# Patient Record
Sex: Female | Born: 1950
Health system: Southern US, Community
[De-identification: ages and names within clinical notes are randomized; demographics above are authoritative.]

## PROBLEM LIST (undated history)

## (undated) DIAGNOSIS — K922 Gastrointestinal hemorrhage, unspecified: Secondary | ICD-10-CM

## (undated) DIAGNOSIS — I351 Nonrheumatic aortic (valve) insufficiency: Secondary | ICD-10-CM

## (undated) DIAGNOSIS — I10 Essential (primary) hypertension: Secondary | ICD-10-CM

## (undated) DIAGNOSIS — Z954 Presence of other heart-valve replacement: Secondary | ICD-10-CM

## (undated) DIAGNOSIS — I319 Disease of pericardium, unspecified: Secondary | ICD-10-CM

## (undated) DIAGNOSIS — E785 Hyperlipidemia, unspecified: Secondary | ICD-10-CM

## (undated) DIAGNOSIS — C801 Malignant (primary) neoplasm, unspecified: Secondary | ICD-10-CM

## (undated) DIAGNOSIS — R0600 Dyspnea, unspecified: Secondary | ICD-10-CM

## (undated) DIAGNOSIS — R600 Localized edema: Secondary | ICD-10-CM

## (undated) DIAGNOSIS — I8393 Asymptomatic varicose veins of bilateral lower extremities: Secondary | ICD-10-CM

## (undated) DIAGNOSIS — R011 Cardiac murmur, unspecified: Secondary | ICD-10-CM

## (undated) DIAGNOSIS — I509 Heart failure, unspecified: Secondary | ICD-10-CM

## (undated) DIAGNOSIS — D649 Anemia, unspecified: Secondary | ICD-10-CM

## (undated) DIAGNOSIS — I071 Rheumatic tricuspid insufficiency: Secondary | ICD-10-CM

## (undated) DIAGNOSIS — Z9889 Other specified postprocedural states: Secondary | ICD-10-CM

## (undated) DIAGNOSIS — I2510 Atherosclerotic heart disease of native coronary artery without angina pectoris: Secondary | ICD-10-CM

## (undated) DIAGNOSIS — I456 Pre-excitation syndrome: Secondary | ICD-10-CM

## (undated) DIAGNOSIS — I499 Cardiac arrhythmia, unspecified: Secondary | ICD-10-CM

## (undated) DIAGNOSIS — E039 Hypothyroidism, unspecified: Secondary | ICD-10-CM

## (undated) DIAGNOSIS — G459 Transient cerebral ischemic attack, unspecified: Secondary | ICD-10-CM

## (undated) DIAGNOSIS — N189 Chronic kidney disease, unspecified: Secondary | ICD-10-CM

## (undated) DIAGNOSIS — I639 Cerebral infarction, unspecified: Secondary | ICD-10-CM

## (undated) HISTORY — PX: ENDOMETRIAL ABLATION: SHX621

## (undated) HISTORY — DX: Hyperlipidemia, unspecified: E78.5

## (undated) HISTORY — PX: SPLENECTOMY, TOTAL: SHX788

## (undated) HISTORY — DX: Presence of other heart-valve replacement: Z95.4

## (undated) HISTORY — PX: MITRAL VALVE REPLACEMENT: SHX147

## (undated) HISTORY — DX: Pre-excitation syndrome: I45.6

## (undated) HISTORY — DX: Other specified postprocedural states: Z98.890

## (undated) HISTORY — DX: Heart failure, unspecified: I50.9

## (undated) HISTORY — PX: THORACOTOMY: SUR1349

## (undated) HISTORY — DX: Hypothyroidism, unspecified: E03.9

## (undated) HISTORY — DX: Gastrointestinal hemorrhage, unspecified: K92.2

## (undated) HISTORY — PX: EXPLORATORY LAPAROTOMY: SUR591

## (undated) HISTORY — PX: MASTECTOMY: SHX3

## (undated) HISTORY — PX: OTHER SURGICAL HISTORY: SHX169

## (undated) HISTORY — DX: Nonrheumatic aortic (valve) insufficiency: I35.1

## (undated) HISTORY — PX: SPLENECTOMY: SUR1306

## (undated) HISTORY — DX: Malignant (primary) neoplasm, unspecified: C80.1

## (undated) HISTORY — PX: TONSILLECTOMY: SUR1361

## (undated) HISTORY — DX: Anemia, unspecified: D64.9

## (undated) HISTORY — PX: AORTIC VALVE SURGERY: SHX549

---
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
## 1996-05-27 LAB — HM COLONOSCOPY: HM Colonoscopy: NORMAL

## 1996-11-02 ENCOUNTER — Encounter: Payer: Self-pay | Admitting: Internal Medicine

## 1997-12-13 ENCOUNTER — Other Ambulatory Visit: Admission: RE | Admit: 1997-12-13 | Discharge: 1997-12-13 | Payer: Self-pay | Admitting: *Deleted

## 1999-01-04 ENCOUNTER — Other Ambulatory Visit: Admission: RE | Admit: 1999-01-04 | Discharge: 1999-01-04 | Payer: Self-pay | Admitting: *Deleted

## 2000-01-11 ENCOUNTER — Other Ambulatory Visit: Admission: RE | Admit: 2000-01-11 | Discharge: 2000-01-11 | Payer: Self-pay | Admitting: *Deleted

## 2001-01-22 ENCOUNTER — Other Ambulatory Visit: Admission: RE | Admit: 2001-01-22 | Discharge: 2001-01-22 | Payer: Self-pay | Admitting: *Deleted

## 2001-03-02 ENCOUNTER — Encounter: Payer: Self-pay | Admitting: Internal Medicine

## 2002-02-12 ENCOUNTER — Other Ambulatory Visit: Admission: RE | Admit: 2002-02-12 | Discharge: 2002-02-12 | Payer: Self-pay | Admitting: *Deleted

## 2002-08-13 ENCOUNTER — Emergency Department (HOSPITAL_COMMUNITY): Admission: EM | Admit: 2002-08-13 | Discharge: 2002-08-13 | Payer: Self-pay | Admitting: Emergency Medicine

## 2003-02-07 ENCOUNTER — Encounter: Payer: Self-pay | Admitting: Internal Medicine

## 2003-02-07 LAB — HM COLONOSCOPY

## 2003-02-14 ENCOUNTER — Other Ambulatory Visit: Admission: RE | Admit: 2003-02-14 | Discharge: 2003-02-14 | Payer: Self-pay | Admitting: *Deleted

## 2003-02-16 ENCOUNTER — Encounter: Payer: Self-pay | Admitting: Internal Medicine

## 2003-03-02 ENCOUNTER — Encounter: Payer: Self-pay | Admitting: Internal Medicine

## 2003-03-21 ENCOUNTER — Encounter: Payer: Self-pay | Admitting: Internal Medicine

## 2003-04-07 ENCOUNTER — Ambulatory Visit (HOSPITAL_COMMUNITY): Admission: RE | Admit: 2003-04-07 | Discharge: 2003-04-08 | Payer: Self-pay | Admitting: Cardiology

## 2003-04-15 ENCOUNTER — Encounter: Payer: Self-pay | Admitting: Cardiology

## 2003-04-15 ENCOUNTER — Ambulatory Visit (HOSPITAL_COMMUNITY): Admission: RE | Admit: 2003-04-15 | Discharge: 2003-04-15 | Payer: Self-pay | Admitting: Cardiology

## 2003-12-28 ENCOUNTER — Encounter: Payer: Self-pay | Admitting: Internal Medicine

## 2003-12-29 ENCOUNTER — Emergency Department (HOSPITAL_COMMUNITY): Admission: EM | Admit: 2003-12-29 | Discharge: 2003-12-30 | Payer: Self-pay

## 2004-07-12 ENCOUNTER — Ambulatory Visit: Payer: Self-pay | Admitting: Cardiology

## 2004-07-24 ENCOUNTER — Ambulatory Visit: Payer: Self-pay

## 2004-08-27 ENCOUNTER — Ambulatory Visit: Payer: Self-pay | Admitting: Internal Medicine

## 2004-12-07 ENCOUNTER — Ambulatory Visit: Payer: Self-pay | Admitting: Internal Medicine

## 2004-12-24 ENCOUNTER — Ambulatory Visit: Payer: Self-pay | Admitting: Internal Medicine

## 2005-01-04 ENCOUNTER — Ambulatory Visit: Payer: Self-pay | Admitting: Internal Medicine

## 2005-02-19 ENCOUNTER — Ambulatory Visit: Payer: Self-pay | Admitting: Internal Medicine

## 2005-03-05 ENCOUNTER — Encounter (HOSPITAL_COMMUNITY): Admission: RE | Admit: 2005-03-05 | Discharge: 2005-03-06 | Payer: Self-pay | Admitting: Internal Medicine

## 2005-04-10 ENCOUNTER — Ambulatory Visit: Payer: Self-pay | Admitting: Internal Medicine

## 2005-06-11 ENCOUNTER — Ambulatory Visit: Payer: Self-pay | Admitting: Internal Medicine

## 2005-07-01 ENCOUNTER — Ambulatory Visit: Payer: Self-pay | Admitting: Internal Medicine

## 2005-08-29 ENCOUNTER — Ambulatory Visit: Payer: Self-pay | Admitting: Cardiology

## 2005-09-06 ENCOUNTER — Ambulatory Visit: Payer: Self-pay | Admitting: Internal Medicine

## 2005-09-06 ENCOUNTER — Encounter: Payer: Self-pay | Admitting: Internal Medicine

## 2005-09-06 ENCOUNTER — Ambulatory Visit (HOSPITAL_COMMUNITY): Admission: RE | Admit: 2005-09-06 | Discharge: 2005-09-06 | Payer: Self-pay | Admitting: Internal Medicine

## 2006-01-06 ENCOUNTER — Ambulatory Visit: Payer: Self-pay

## 2006-01-06 ENCOUNTER — Encounter: Payer: Self-pay | Admitting: Cardiology

## 2006-01-14 ENCOUNTER — Ambulatory Visit: Payer: Self-pay | Admitting: Internal Medicine

## 2006-03-07 ENCOUNTER — Ambulatory Visit (HOSPITAL_COMMUNITY): Admission: RE | Admit: 2006-03-07 | Discharge: 2006-03-07 | Payer: Self-pay | Admitting: Cardiology

## 2006-03-07 ENCOUNTER — Ambulatory Visit: Payer: Self-pay | Admitting: Cardiology

## 2006-03-13 ENCOUNTER — Inpatient Hospital Stay (HOSPITAL_BASED_OUTPATIENT_CLINIC_OR_DEPARTMENT_OTHER): Admission: RE | Admit: 2006-03-13 | Discharge: 2006-03-13 | Payer: Self-pay | Admitting: Cardiology

## 2006-03-13 ENCOUNTER — Ambulatory Visit: Payer: Self-pay | Admitting: Cardiology

## 2006-04-22 ENCOUNTER — Ambulatory Visit: Payer: Self-pay | Admitting: Internal Medicine

## 2006-04-23 LAB — CONVERTED CEMR LAB
ALT: 22 units/L (ref 0–40)
AST: 30 units/L (ref 0–37)
Albumin: 3.8 g/dL (ref 3.5–5.2)
Alkaline Phosphatase: 55 units/L (ref 39–117)
BUN: 15 mg/dL (ref 6–23)
Basophils Absolute: 0 10*3/uL (ref 0.0–0.1)
Basophils Relative: 0.3 % (ref 0.0–1.0)
Bilirubin, Direct: 0.1 mg/dL (ref 0.0–0.3)
CO2: 30 meq/L (ref 19–32)
Calcium: 9.5 mg/dL (ref 8.4–10.5)
Chloride: 100 meq/L (ref 96–112)
Creatinine, Ser: 1.6 mg/dL — ABNORMAL HIGH (ref 0.4–1.2)
Eosinophil percent: 3.2 % (ref 0.0–5.0)
GFR calc non Af Amer: 36 mL/min
Glomerular Filtration Rate, Af Am: 43 mL/min/{1.73_m2}
Glucose, Bld: 107 mg/dL — ABNORMAL HIGH (ref 70–99)
HCT: 34.1 % — ABNORMAL LOW (ref 36.0–46.0)
Hemoglobin: 10.9 g/dL — ABNORMAL LOW (ref 12.0–15.0)
Lymphocytes Relative: 14.2 % (ref 12.0–46.0)
MCHC: 32 g/dL (ref 30.0–36.0)
MCV: 87.7 fL (ref 78.0–100.0)
Monocytes Absolute: 0.8 10*3/uL — ABNORMAL HIGH (ref 0.2–0.7)
Monocytes Relative: 9.5 % (ref 3.0–11.0)
Neutro Abs: 5.8 10*3/uL (ref 1.4–7.7)
Neutrophils Relative %: 72.8 % (ref 43.0–77.0)
Platelets: 519 10*3/uL — ABNORMAL HIGH (ref 150–400)
Potassium: 4.1 meq/L (ref 3.5–5.1)
RBC: 3.88 M/uL (ref 3.87–5.11)
RDW: 14 % (ref 11.5–14.6)
Sodium: 139 meq/L (ref 135–145)
TSH: 0.18 microintl units/mL — ABNORMAL LOW (ref 0.35–5.50)
Total Bilirubin: 0.4 mg/dL (ref 0.3–1.2)
Total Protein: 7.1 g/dL (ref 6.0–8.3)
WBC: 8 10*3/uL (ref 4.5–10.5)

## 2006-05-27 DIAGNOSIS — I639 Cerebral infarction, unspecified: Secondary | ICD-10-CM

## 2006-05-27 HISTORY — DX: Cerebral infarction, unspecified: I63.9

## 2006-06-13 HISTORY — PX: OTHER SURGICAL HISTORY: SHX169

## 2006-07-01 ENCOUNTER — Ambulatory Visit: Payer: Self-pay | Admitting: Cardiology

## 2006-07-08 ENCOUNTER — Ambulatory Visit: Payer: Self-pay | Admitting: Cardiology

## 2006-07-15 ENCOUNTER — Ambulatory Visit: Payer: Self-pay | Admitting: Cardiology

## 2006-07-15 LAB — CONVERTED CEMR LAB
BUN: 10 mg/dL (ref 6–23)
Basophils Absolute: 0.1 10*3/uL (ref 0.0–0.1)
Basophils Relative: 0.6 % (ref 0.0–1.0)
CO2: 32 meq/L (ref 19–32)
Calcium: 9.6 mg/dL (ref 8.4–10.5)
Chloride: 102 meq/L (ref 96–112)
Creatinine, Ser: 1 mg/dL (ref 0.4–1.2)
Eosinophils Absolute: 0.7 10*3/uL — ABNORMAL HIGH (ref 0.0–0.6)
Eosinophils Relative: 6.7 % — ABNORMAL HIGH (ref 0.0–5.0)
GFR calc Af Amer: 74 mL/min
GFR calc non Af Amer: 61 mL/min
Glucose, Bld: 92 mg/dL (ref 70–99)
HCT: 34.1 % — ABNORMAL LOW (ref 36.0–46.0)
Hemoglobin: 11.3 g/dL — ABNORMAL LOW (ref 12.0–15.0)
INR: 1.8 (ref 0.9–2.0)
Lymphocytes Relative: 14.7 % (ref 12.0–46.0)
MCHC: 33 g/dL (ref 30.0–36.0)
MCV: 87.1 fL (ref 78.0–100.0)
Monocytes Absolute: 1.4 10*3/uL — ABNORMAL HIGH (ref 0.2–0.7)
Monocytes Relative: 13.6 % — ABNORMAL HIGH (ref 3.0–11.0)
Neutro Abs: 6.8 10*3/uL (ref 1.4–7.7)
Neutrophils Relative %: 64.4 % (ref 43.0–77.0)
Platelets: 596 10*3/uL — ABNORMAL HIGH (ref 150–400)
Potassium: 4 meq/L (ref 3.5–5.1)
Prothrombin Time: 17.1 s — ABNORMAL HIGH (ref 10.0–14.0)
RBC: 3.92 M/uL (ref 3.87–5.11)
RDW: 16 % — ABNORMAL HIGH (ref 11.5–14.6)
Sodium: 142 meq/L (ref 135–145)
WBC: 10.5 10*3/uL (ref 4.5–10.5)

## 2006-07-21 DIAGNOSIS — R0609 Other forms of dyspnea: Secondary | ICD-10-CM | POA: Insufficient documentation

## 2006-07-21 DIAGNOSIS — R0600 Dyspnea, unspecified: Secondary | ICD-10-CM | POA: Insufficient documentation

## 2006-07-21 DIAGNOSIS — I2510 Atherosclerotic heart disease of native coronary artery without angina pectoris: Secondary | ICD-10-CM | POA: Insufficient documentation

## 2006-07-24 ENCOUNTER — Ambulatory Visit (HOSPITAL_COMMUNITY): Admission: RE | Admit: 2006-07-24 | Discharge: 2006-07-24 | Payer: Self-pay | Admitting: Cardiology

## 2006-07-24 ENCOUNTER — Ambulatory Visit: Payer: Self-pay | Admitting: *Deleted

## 2006-07-24 ENCOUNTER — Ambulatory Visit: Payer: Self-pay | Admitting: Cardiology

## 2006-07-24 LAB — CONVERTED CEMR LAB
ALT: 20 units/L (ref 0–40)
AST: 26 units/L (ref 0–37)
Albumin: 3.1 g/dL — ABNORMAL LOW (ref 3.5–5.2)
Alkaline Phosphatase: 95 units/L (ref 39–117)
BUN: 12 mg/dL (ref 6–23)
Basophils Absolute: 0 10*3/uL (ref 0.0–0.1)
Basophils Relative: 0.1 % (ref 0.0–1.0)
Bilirubin, Direct: 0.1 mg/dL (ref 0.0–0.3)
CO2: 30 meq/L (ref 19–32)
Calcium: 9.2 mg/dL (ref 8.4–10.5)
Chloride: 100 meq/L (ref 96–112)
Creatinine, Ser: 1.1 mg/dL (ref 0.4–1.2)
Eosinophils Absolute: 0.5 10*3/uL (ref 0.0–0.6)
Eosinophils Relative: 5.3 % — ABNORMAL HIGH (ref 0.0–5.0)
GFR calc Af Amer: 66 mL/min
GFR calc non Af Amer: 55 mL/min
Glucose, Bld: 89 mg/dL (ref 70–99)
HCT: 31.4 % — ABNORMAL LOW (ref 36.0–46.0)
Hemoglobin: 10.3 g/dL — ABNORMAL LOW (ref 12.0–15.0)
Lipase: 60 units/L — ABNORMAL HIGH (ref 11.0–59.0)
Lymphocytes Relative: 14.3 % (ref 12.0–46.0)
MCHC: 32.8 g/dL (ref 30.0–36.0)
MCV: 87 fL (ref 78.0–100.0)
Monocytes Absolute: 1 10*3/uL — ABNORMAL HIGH (ref 0.2–0.7)
Monocytes Relative: 11.2 % — ABNORMAL HIGH (ref 3.0–11.0)
Neutro Abs: 6.5 10*3/uL (ref 1.4–7.7)
Neutrophils Relative %: 69.1 % (ref 43.0–77.0)
Platelets: 490 10*3/uL — ABNORMAL HIGH (ref 150–400)
Potassium: 3.4 meq/L — ABNORMAL LOW (ref 3.5–5.1)
Pro B Natriuretic peptide (BNP): 269 pg/mL — ABNORMAL HIGH (ref 0.0–100.0)
RBC: 3.61 M/uL — ABNORMAL LOW (ref 3.87–5.11)
RDW: 15.3 % — ABNORMAL HIGH (ref 11.5–14.6)
Sodium: 137 meq/L (ref 135–145)
Total Bilirubin: 0.3 mg/dL (ref 0.3–1.2)
Total Protein: 7 g/dL (ref 6.0–8.3)
WBC: 9.3 10*3/uL (ref 4.5–10.5)

## 2006-07-30 ENCOUNTER — Encounter: Payer: Self-pay | Admitting: Cardiology

## 2006-07-30 ENCOUNTER — Ambulatory Visit: Payer: Self-pay | Admitting: Internal Medicine

## 2006-07-30 ENCOUNTER — Inpatient Hospital Stay (HOSPITAL_COMMUNITY): Admission: EM | Admit: 2006-07-30 | Discharge: 2006-08-11 | Payer: Self-pay | Admitting: Emergency Medicine

## 2006-07-30 ENCOUNTER — Ambulatory Visit: Payer: Self-pay

## 2006-07-31 ENCOUNTER — Encounter: Payer: Self-pay | Admitting: Internal Medicine

## 2006-08-01 ENCOUNTER — Encounter (INDEPENDENT_AMBULATORY_CARE_PROVIDER_SITE_OTHER): Payer: Self-pay | Admitting: *Deleted

## 2006-08-04 ENCOUNTER — Ambulatory Visit: Payer: Self-pay | Admitting: Oncology

## 2006-08-07 ENCOUNTER — Encounter (INDEPENDENT_AMBULATORY_CARE_PROVIDER_SITE_OTHER): Payer: Self-pay | Admitting: Specialist

## 2006-08-13 ENCOUNTER — Ambulatory Visit: Payer: Self-pay | Admitting: Cardiology

## 2006-08-13 LAB — CONVERTED CEMR LAB
Basophils Absolute: 0.1 10*3/uL (ref 0.0–0.1)
Basophils Relative: 1.1 % — ABNORMAL HIGH (ref 0.0–1.0)
Eosinophils Absolute: 0.3 10*3/uL (ref 0.0–0.6)
Eosinophils Relative: 3.3 % (ref 0.0–5.0)
HCT: 31.3 % — ABNORMAL LOW (ref 36.0–46.0)
Hemoglobin: 10.5 g/dL — ABNORMAL LOW (ref 12.0–15.0)
INR: 1.9 (ref 0.9–2.0)
Lymphocytes Relative: 10.8 % — ABNORMAL LOW (ref 12.0–46.0)
MCHC: 33.6 g/dL (ref 30.0–36.0)
MCV: 84.7 fL (ref 78.0–100.0)
Monocytes Absolute: 1.4 10*3/uL — ABNORMAL HIGH (ref 0.2–0.7)
Monocytes Relative: 15 % — ABNORMAL HIGH (ref 3.0–11.0)
Neutro Abs: 6.8 10*3/uL (ref 1.4–7.7)
Neutrophils Relative %: 69.8 % (ref 43.0–77.0)
Platelets: 566 10*3/uL — ABNORMAL HIGH (ref 150–400)
Prothrombin Time: 16.9 s — ABNORMAL HIGH (ref 10.0–14.0)
RBC: 3.69 M/uL — ABNORMAL LOW (ref 3.87–5.11)
RDW: 16.1 % — ABNORMAL HIGH (ref 11.5–14.6)
WBC: 9.6 10*3/uL (ref 4.5–10.5)

## 2006-08-14 ENCOUNTER — Encounter (HOSPITAL_COMMUNITY): Admission: RE | Admit: 2006-08-14 | Discharge: 2006-11-12 | Payer: Self-pay | Admitting: Cardiology

## 2006-08-18 ENCOUNTER — Ambulatory Visit: Payer: Self-pay | Admitting: Cardiology

## 2006-08-18 LAB — CONVERTED CEMR LAB
Basophils Absolute: 0 10*3/uL (ref 0.0–0.1)
Basophils Relative: 0.1 % (ref 0.0–1.0)
Eosinophils Absolute: 0.3 10*3/uL (ref 0.0–0.6)
Eosinophils Relative: 4 % (ref 0.0–5.0)
HCT: 30 % — ABNORMAL LOW (ref 36.0–46.0)
Hemoglobin: 9.7 g/dL — ABNORMAL LOW (ref 12.0–15.0)
INR: 3.3 — ABNORMAL HIGH (ref 0.9–2.0)
Lymphocytes Relative: 18.5 % (ref 12.0–46.0)
MCHC: 32.2 g/dL (ref 30.0–36.0)
MCV: 86 fL (ref 78.0–100.0)
Monocytes Absolute: 1.2 10*3/uL — ABNORMAL HIGH (ref 0.2–0.7)
Monocytes Relative: 18.2 % — ABNORMAL HIGH (ref 3.0–11.0)
Neutro Abs: 4 10*3/uL (ref 1.4–7.7)
Neutrophils Relative %: 59.2 % (ref 43.0–77.0)
Platelets: 608 10*3/uL — ABNORMAL HIGH (ref 150–400)
Prothrombin Time: 23.1 s — ABNORMAL HIGH (ref 10.0–14.0)
RBC: 3.49 M/uL — ABNORMAL LOW (ref 3.87–5.11)
RDW: 16.6 % — ABNORMAL HIGH (ref 11.5–14.6)
WBC: 6.7 10*3/uL (ref 4.5–10.5)

## 2006-08-25 ENCOUNTER — Ambulatory Visit: Payer: Self-pay | Admitting: Cardiology

## 2006-08-25 LAB — CONVERTED CEMR LAB
Basophils Absolute: 0 10*3/uL (ref 0.0–0.1)
Basophils Relative: 0.1 % (ref 0.0–1.0)
Eosinophils Absolute: 0.3 10*3/uL (ref 0.0–0.6)
Eosinophils Relative: 5.9 % — ABNORMAL HIGH (ref 0.0–5.0)
HCT: 32.1 % — ABNORMAL LOW (ref 36.0–46.0)
Hemoglobin: 10.6 g/dL — ABNORMAL LOW (ref 12.0–15.0)
Lymphocytes Relative: 21.7 % (ref 12.0–46.0)
MCHC: 33 g/dL (ref 30.0–36.0)
MCV: 86.2 fL (ref 78.0–100.0)
Monocytes Absolute: 0.7 10*3/uL (ref 0.2–0.7)
Monocytes Relative: 12.4 % — ABNORMAL HIGH (ref 3.0–11.0)
Neutro Abs: 3.6 10*3/uL (ref 1.4–7.7)
Neutrophils Relative %: 59.9 % (ref 43.0–77.0)
Platelets: 614 10*3/uL — ABNORMAL HIGH (ref 150–400)
RBC: 3.72 M/uL — ABNORMAL LOW (ref 3.87–5.11)
RDW: 16.4 % — ABNORMAL HIGH (ref 11.5–14.6)
WBC: 5.9 10*3/uL (ref 4.5–10.5)

## 2006-09-04 ENCOUNTER — Ambulatory Visit: Payer: Self-pay | Admitting: Cardiology

## 2006-09-04 LAB — CONVERTED CEMR LAB
BUN: 16 mg/dL (ref 6–23)
Basophils Absolute: 0 10*3/uL (ref 0.0–0.1)
Basophils Relative: 0.1 % (ref 0.0–1.0)
CO2: 30 meq/L (ref 19–32)
Calcium: 9.2 mg/dL (ref 8.4–10.5)
Chloride: 105 meq/L (ref 96–112)
Creatinine, Ser: 1 mg/dL (ref 0.4–1.2)
Eosinophils Absolute: 0.3 10*3/uL (ref 0.0–0.6)
Eosinophils Relative: 4.2 % (ref 0.0–5.0)
GFR calc Af Amer: 74 mL/min
GFR calc non Af Amer: 61 mL/min
Glucose, Bld: 88 mg/dL (ref 70–99)
HCT: 34.8 % — ABNORMAL LOW (ref 36.0–46.0)
Hemoglobin: 11.7 g/dL — ABNORMAL LOW (ref 12.0–15.0)
INR: 2.3 — ABNORMAL HIGH (ref 0.9–2.0)
Lymphocytes Relative: 18.8 % (ref 12.0–46.0)
MCHC: 33.5 g/dL (ref 30.0–36.0)
MCV: 85.7 fL (ref 78.0–100.0)
Monocytes Absolute: 1.1 10*3/uL — ABNORMAL HIGH (ref 0.2–0.7)
Monocytes Relative: 13.7 % — ABNORMAL HIGH (ref 3.0–11.0)
Neutro Abs: 4.9 10*3/uL (ref 1.4–7.7)
Neutrophils Relative %: 63.2 % (ref 43.0–77.0)
Platelets: 450 10*3/uL — ABNORMAL HIGH (ref 150–400)
Potassium: 3.7 meq/L (ref 3.5–5.1)
Prothrombin Time: 19.1 s — ABNORMAL HIGH (ref 10.0–14.0)
RBC: 4.07 M/uL (ref 3.87–5.11)
RDW: 16.5 % — ABNORMAL HIGH (ref 11.5–14.6)
Sodium: 141 meq/L (ref 135–145)
WBC: 7.8 10*3/uL (ref 4.5–10.5)
aPTT: 32.5 s (ref 26.5–36.5)

## 2006-09-08 ENCOUNTER — Ambulatory Visit: Payer: Self-pay | Admitting: Cardiology

## 2006-09-08 ENCOUNTER — Inpatient Hospital Stay (HOSPITAL_BASED_OUTPATIENT_CLINIC_OR_DEPARTMENT_OTHER): Admission: RE | Admit: 2006-09-08 | Discharge: 2006-09-08 | Payer: Self-pay | Admitting: Cardiology

## 2006-09-18 ENCOUNTER — Ambulatory Visit: Payer: Self-pay | Admitting: Internal Medicine

## 2006-09-22 ENCOUNTER — Ambulatory Visit: Payer: Self-pay | Admitting: Cardiology

## 2006-09-26 ENCOUNTER — Ambulatory Visit: Payer: Self-pay | Admitting: Internal Medicine

## 2006-09-26 LAB — CONVERTED CEMR LAB: TSH: 5.9 microintl units/mL — ABNORMAL HIGH (ref 0.35–5.50)

## 2006-09-29 ENCOUNTER — Ambulatory Visit: Payer: Self-pay | Admitting: Cardiology

## 2006-10-06 ENCOUNTER — Ambulatory Visit: Payer: Self-pay | Admitting: Internal Medicine

## 2006-10-13 ENCOUNTER — Ambulatory Visit: Payer: Self-pay | Admitting: Cardiology

## 2006-10-14 ENCOUNTER — Ambulatory Visit: Payer: Self-pay | Admitting: Internal Medicine

## 2006-10-14 ENCOUNTER — Ambulatory Visit (HOSPITAL_COMMUNITY): Admission: RE | Admit: 2006-10-14 | Discharge: 2006-10-14 | Payer: Self-pay | Admitting: Internal Medicine

## 2006-10-15 ENCOUNTER — Ambulatory Visit: Payer: Self-pay | Admitting: Cardiology

## 2006-10-31 ENCOUNTER — Ambulatory Visit: Payer: Self-pay | Admitting: Cardiology

## 2006-11-03 DIAGNOSIS — E039 Hypothyroidism, unspecified: Secondary | ICD-10-CM | POA: Insufficient documentation

## 2006-11-03 DIAGNOSIS — G43009 Migraine without aura, not intractable, without status migrainosus: Secondary | ICD-10-CM | POA: Insufficient documentation

## 2006-11-03 DIAGNOSIS — Z8571 Personal history of Hodgkin lymphoma: Secondary | ICD-10-CM | POA: Insufficient documentation

## 2006-11-03 DIAGNOSIS — Z8719 Personal history of other diseases of the digestive system: Secondary | ICD-10-CM | POA: Insufficient documentation

## 2006-11-03 DIAGNOSIS — Z853 Personal history of malignant neoplasm of breast: Secondary | ICD-10-CM | POA: Insufficient documentation

## 2006-11-10 ENCOUNTER — Ambulatory Visit: Payer: Self-pay

## 2006-11-10 LAB — CONVERTED CEMR LAB
INR: 2.2 — ABNORMAL HIGH (ref 0.9–2.0)
Prothrombin Time: 18.6 s — ABNORMAL HIGH (ref 10.0–14.0)

## 2006-11-13 ENCOUNTER — Encounter (HOSPITAL_COMMUNITY): Admission: RE | Admit: 2006-11-13 | Discharge: 2006-12-01 | Payer: Self-pay | Admitting: Cardiology

## 2006-11-14 ENCOUNTER — Ambulatory Visit: Payer: Self-pay | Admitting: Cardiovascular Disease

## 2006-11-14 ENCOUNTER — Ambulatory Visit: Payer: Self-pay | Admitting: Internal Medicine

## 2006-11-14 LAB — CONVERTED CEMR LAB
BUN: 19 mg/dL (ref 6–23)
CO2: 34 meq/L — ABNORMAL HIGH (ref 19–32)
Calcium: 9.5 mg/dL (ref 8.4–10.5)
Chloride: 104 meq/L (ref 96–112)
Creatinine, Ser: 1.2 mg/dL (ref 0.4–1.2)
GFR calc Af Amer: 60 mL/min
GFR calc non Af Amer: 49 mL/min
Glucose, Bld: 88 mg/dL (ref 70–99)
INR: 2.4 — ABNORMAL HIGH (ref 0.9–2.0)
Potassium: 3.6 meq/L (ref 3.5–5.1)
Pro B Natriuretic peptide (BNP): 293 pg/mL — ABNORMAL HIGH (ref 0.0–100.0)
Prothrombin Time: 19.4 s — ABNORMAL HIGH (ref 10.0–14.0)
Sodium: 141 meq/L (ref 135–145)

## 2006-11-17 ENCOUNTER — Ambulatory Visit: Payer: Self-pay | Admitting: Internal Medicine

## 2006-11-24 ENCOUNTER — Ambulatory Visit: Payer: Self-pay | Admitting: Cardiology

## 2006-12-10 ENCOUNTER — Ambulatory Visit: Payer: Self-pay | Admitting: Cardiovascular Disease

## 2007-01-07 ENCOUNTER — Ambulatory Visit: Payer: Self-pay | Admitting: Internal Medicine

## 2007-01-26 HISTORY — PX: OTHER SURGICAL HISTORY: SHX169

## 2007-02-05 ENCOUNTER — Ambulatory Visit: Payer: Self-pay | Admitting: Cardiology

## 2007-02-16 ENCOUNTER — Ambulatory Visit: Payer: Self-pay | Admitting: Internal Medicine

## 2007-03-09 ENCOUNTER — Encounter: Payer: Self-pay | Admitting: Internal Medicine

## 2007-03-09 ENCOUNTER — Ambulatory Visit: Payer: Self-pay | Admitting: Internal Medicine

## 2007-03-09 ENCOUNTER — Ambulatory Visit: Payer: Self-pay | Admitting: Cardiovascular Disease

## 2007-03-09 ENCOUNTER — Inpatient Hospital Stay (HOSPITAL_COMMUNITY): Admission: EM | Admit: 2007-03-09 | Discharge: 2007-03-11 | Payer: Self-pay | Admitting: Emergency Medicine

## 2007-03-10 ENCOUNTER — Encounter (INDEPENDENT_AMBULATORY_CARE_PROVIDER_SITE_OTHER): Payer: Self-pay | Admitting: Neurology

## 2007-03-10 ENCOUNTER — Telehealth: Payer: Self-pay | Admitting: Internal Medicine

## 2007-03-16 ENCOUNTER — Ambulatory Visit: Payer: Self-pay | Admitting: Internal Medicine

## 2007-03-19 ENCOUNTER — Encounter: Payer: Self-pay | Admitting: Internal Medicine

## 2007-03-26 ENCOUNTER — Telehealth: Payer: Self-pay | Admitting: Internal Medicine

## 2007-03-26 ENCOUNTER — Ambulatory Visit: Payer: Self-pay | Admitting: Cardiology

## 2007-03-31 ENCOUNTER — Telehealth: Payer: Self-pay | Admitting: Internal Medicine

## 2007-04-09 ENCOUNTER — Ambulatory Visit: Payer: Self-pay | Admitting: Cardiovascular Disease

## 2007-04-20 ENCOUNTER — Ambulatory Visit: Payer: Self-pay | Admitting: Internal Medicine

## 2007-04-20 LAB — CONVERTED CEMR LAB: TSH: 10 microintl units/mL — ABNORMAL HIGH (ref 0.35–5.50)

## 2007-04-27 ENCOUNTER — Ambulatory Visit: Payer: Self-pay | Admitting: Internal Medicine

## 2007-04-27 DIAGNOSIS — Z9889 Other specified postprocedural states: Secondary | ICD-10-CM

## 2007-04-27 DIAGNOSIS — Z954 Presence of other heart-valve replacement: Secondary | ICD-10-CM

## 2007-04-27 DIAGNOSIS — Z8679 Personal history of other diseases of the circulatory system: Secondary | ICD-10-CM | POA: Insufficient documentation

## 2007-04-27 HISTORY — DX: Presence of other heart-valve replacement: Z95.4

## 2007-04-27 HISTORY — DX: Other specified postprocedural states: Z98.890

## 2007-04-27 LAB — CONVERTED CEMR LAB
INR: 2.4
Prothrombin Time: 18.7 s

## 2007-05-08 ENCOUNTER — Ambulatory Visit: Payer: Self-pay | Admitting: Internal Medicine

## 2007-05-08 LAB — CONVERTED CEMR LAB
INR: 5.8
Prothrombin Time: 29.3 s

## 2007-05-15 ENCOUNTER — Encounter: Payer: Self-pay | Admitting: Internal Medicine

## 2007-05-15 ENCOUNTER — Telehealth: Payer: Self-pay | Admitting: Internal Medicine

## 2007-06-22 ENCOUNTER — Ambulatory Visit: Payer: Self-pay | Admitting: Internal Medicine

## 2007-06-22 LAB — CONVERTED CEMR LAB
INR: 1.8
Prothrombin Time: 16.7 s

## 2007-06-29 ENCOUNTER — Ambulatory Visit: Payer: Self-pay | Admitting: Internal Medicine

## 2007-06-29 DIAGNOSIS — R0609 Other forms of dyspnea: Secondary | ICD-10-CM | POA: Insufficient documentation

## 2007-06-29 DIAGNOSIS — R0989 Other specified symptoms and signs involving the circulatory and respiratory systems: Secondary | ICD-10-CM

## 2007-06-29 LAB — CONVERTED CEMR LAB
INR: 3
Prothrombin Time: 20.8 s

## 2007-06-30 LAB — CONVERTED CEMR LAB
ALT: 31 units/L (ref 0–35)
AST: 39 units/L — ABNORMAL HIGH (ref 0–37)
Albumin: 4.5 g/dL (ref 3.5–5.2)
Alkaline Phosphatase: 73 units/L (ref 39–117)
BUN: 14 mg/dL (ref 6–23)
Basophils Absolute: 0 10*3/uL (ref 0.0–0.1)
Basophils Relative: 0.1 % (ref 0.0–1.0)
Bilirubin, Direct: 0.2 mg/dL (ref 0.0–0.3)
CO2: 32 meq/L (ref 19–32)
Calcium: 10.4 mg/dL (ref 8.4–10.5)
Chloride: 102 meq/L (ref 96–112)
Creatinine, Ser: 1.1 mg/dL (ref 0.4–1.2)
Eosinophils Absolute: 0.3 10*3/uL (ref 0.0–0.6)
Eosinophils Relative: 4.1 % (ref 0.0–5.0)
GFR calc Af Amer: 66 mL/min
GFR calc non Af Amer: 55 mL/min
Glucose, Bld: 79 mg/dL (ref 70–99)
HCT: 34.1 % — ABNORMAL LOW (ref 36.0–46.0)
Hemoglobin: 11.4 g/dL — ABNORMAL LOW (ref 12.0–15.0)
Lymphocytes Relative: 18.3 % (ref 12.0–46.0)
MCHC: 33.4 g/dL (ref 30.0–36.0)
MCV: 90.1 fL (ref 78.0–100.0)
Monocytes Absolute: 1.2 10*3/uL — ABNORMAL HIGH (ref 0.2–0.7)
Monocytes Relative: 14.4 % — ABNORMAL HIGH (ref 3.0–11.0)
Neutro Abs: 5.4 10*3/uL (ref 1.4–7.7)
Neutrophils Relative %: 63.1 % (ref 43.0–77.0)
Platelets: 442 10*3/uL — ABNORMAL HIGH (ref 150–400)
Potassium: 4 meq/L (ref 3.5–5.1)
RBC: 3.79 M/uL — ABNORMAL LOW (ref 3.87–5.11)
RDW: 15.1 % — ABNORMAL HIGH (ref 11.5–14.6)
Sodium: 142 meq/L (ref 135–145)
TSH: 1.77 microintl units/mL (ref 0.35–5.50)
Total Bilirubin: 0.7 mg/dL (ref 0.3–1.2)
Total Protein: 7.3 g/dL (ref 6.0–8.3)
WBC: 8.4 10*3/uL (ref 4.5–10.5)

## 2007-07-06 ENCOUNTER — Ambulatory Visit: Payer: Self-pay | Admitting: Internal Medicine

## 2007-07-08 ENCOUNTER — Ambulatory Visit: Payer: Self-pay | Admitting: Cardiology

## 2007-07-20 ENCOUNTER — Encounter: Payer: Self-pay | Admitting: Internal Medicine

## 2007-07-28 ENCOUNTER — Ambulatory Visit: Payer: Self-pay | Admitting: Internal Medicine

## 2007-07-28 LAB — CONVERTED CEMR LAB: INR: 2.5

## 2007-08-13 ENCOUNTER — Encounter: Payer: Self-pay | Admitting: Internal Medicine

## 2007-09-01 ENCOUNTER — Ambulatory Visit: Payer: Self-pay | Admitting: Internal Medicine

## 2007-09-01 LAB — CONVERTED CEMR LAB
INR: 1.1
Prothrombin Time: 12.9 s

## 2007-09-08 ENCOUNTER — Ambulatory Visit: Payer: Self-pay | Admitting: Internal Medicine

## 2007-09-08 LAB — CONVERTED CEMR LAB
INR: 5.1
Prothrombin Time: 27.5 s

## 2007-09-14 ENCOUNTER — Ambulatory Visit: Payer: Self-pay | Admitting: Internal Medicine

## 2007-09-14 LAB — CONVERTED CEMR LAB
INR: 2.4
Prothrombin Time: 19 s

## 2007-10-12 ENCOUNTER — Ambulatory Visit: Payer: Self-pay | Admitting: Internal Medicine

## 2007-10-12 LAB — CONVERTED CEMR LAB
INR: 2.8
Prothrombin Time: 20.2 s

## 2007-10-23 ENCOUNTER — Encounter: Payer: Self-pay | Admitting: Internal Medicine

## 2007-11-11 ENCOUNTER — Ambulatory Visit: Payer: Self-pay | Admitting: Internal Medicine

## 2007-11-11 LAB — CONVERTED CEMR LAB
INR: 2.3
Prothrombin Time: 18.4 s

## 2007-11-20 ENCOUNTER — Telehealth: Payer: Self-pay | Admitting: Internal Medicine

## 2007-12-09 ENCOUNTER — Ambulatory Visit: Payer: Self-pay | Admitting: Internal Medicine

## 2007-12-09 LAB — CONVERTED CEMR LAB
INR: 3.5
Prothrombin Time: 22.4 s

## 2007-12-21 ENCOUNTER — Ambulatory Visit: Payer: Self-pay | Admitting: Internal Medicine

## 2007-12-21 LAB — CONVERTED CEMR LAB
INR: 2.8
Prothrombin Time: 20.2 s

## 2007-12-23 ENCOUNTER — Telehealth: Payer: Self-pay | Admitting: Internal Medicine

## 2008-01-15 ENCOUNTER — Ambulatory Visit: Payer: Self-pay | Admitting: Internal Medicine

## 2008-01-15 LAB — CONVERTED CEMR LAB
INR: 2.5
Prothrombin Time: 19.2 s

## 2008-02-15 ENCOUNTER — Ambulatory Visit: Payer: Self-pay | Admitting: Internal Medicine

## 2008-02-15 LAB — CONVERTED CEMR LAB
INR: 2
Prothrombin Time: 17.2 s

## 2008-03-16 ENCOUNTER — Ambulatory Visit: Payer: Self-pay | Admitting: Internal Medicine

## 2008-03-16 LAB — CONVERTED CEMR LAB
INR: 2.6
Prothrombin Time: 19.7 s

## 2008-04-18 ENCOUNTER — Ambulatory Visit: Payer: Self-pay | Admitting: Internal Medicine

## 2008-04-18 LAB — CONVERTED CEMR LAB
INR: 2.4
Prothrombin Time: 18.1 s

## 2008-05-16 ENCOUNTER — Ambulatory Visit: Payer: Self-pay | Admitting: Internal Medicine

## 2008-05-16 LAB — CONVERTED CEMR LAB
INR: 2.1
Prothrombin Time: 18 s

## 2008-06-16 ENCOUNTER — Ambulatory Visit: Payer: Self-pay | Admitting: Internal Medicine

## 2008-06-16 LAB — CONVERTED CEMR LAB
INR: 2.4
Prothrombin Time: 18.8 s

## 2008-07-18 ENCOUNTER — Ambulatory Visit: Payer: Self-pay | Admitting: Internal Medicine

## 2008-07-18 LAB — CONVERTED CEMR LAB
INR: 2
Prothrombin Time: 17.6 s

## 2008-08-09 ENCOUNTER — Telehealth: Payer: Self-pay | Admitting: Internal Medicine

## 2008-08-16 ENCOUNTER — Ambulatory Visit: Payer: Self-pay | Admitting: Internal Medicine

## 2008-08-16 LAB — CONVERTED CEMR LAB
Basophils Absolute: 0.1 10*3/uL (ref 0.0–0.1)
Basophils Relative: 1.3 % (ref 0.0–3.0)
Cholesterol: 189 mg/dL (ref 0–200)
Eosinophils Absolute: 0.4 10*3/uL (ref 0.0–0.7)
Eosinophils Relative: 4.8 % (ref 0.0–5.0)
HCT: 33.8 % — ABNORMAL LOW (ref 36.0–46.0)
HDL: 57.8 mg/dL (ref >39.00)
Hemoglobin: 11.4 g/dL — ABNORMAL LOW (ref 12.0–15.0)
INR: 1.5
LDL Cholesterol: 106 mg/dL — ABNORMAL HIGH (ref 0–99)
Lymphocytes Relative: 18.7 % (ref 12.0–46.0)
Lymphs Abs: 1.5 10*3/uL (ref 0.7–4.0)
MCHC: 33.6 g/dL (ref 30.0–36.0)
MCV: 91.4 fL (ref 78.0–100.0)
Monocytes Absolute: 1.1 10*3/uL — ABNORMAL HIGH (ref 0.1–1.0)
Monocytes Relative: 13.7 % — ABNORMAL HIGH (ref 3.0–12.0)
Neutro Abs: 4.9 10*3/uL (ref 1.4–7.7)
Neutrophils Relative %: 61.5 % (ref 43.0–77.0)
Platelets: 309 10*3/uL (ref 150.0–400.0)
Prothrombin Time: 15.2 s
RBC: 3.7 M/uL — ABNORMAL LOW (ref 3.87–5.11)
RDW: 14.1 % (ref 11.5–14.6)
TSH: 9.86 microintl units/mL — ABNORMAL HIGH (ref 0.35–5.50)
Total CHOL/HDL Ratio: 3
Triglycerides: 124 mg/dL (ref 0.0–149.0)
VLDL: 24.8 mg/dL (ref 0.0–40.0)
WBC: 8 10*3/uL (ref 4.5–10.5)

## 2008-08-18 ENCOUNTER — Encounter: Payer: Self-pay | Admitting: Internal Medicine

## 2008-08-29 ENCOUNTER — Ambulatory Visit: Payer: Self-pay | Admitting: Internal Medicine

## 2008-08-29 DIAGNOSIS — E785 Hyperlipidemia, unspecified: Secondary | ICD-10-CM | POA: Insufficient documentation

## 2008-08-29 LAB — CONVERTED CEMR LAB
INR: 2.3
Prothrombin Time: 18.5 s

## 2008-08-31 LAB — CONVERTED CEMR LAB
ALT: 26 units/L (ref 0–35)
AST: 34 units/L (ref 0–37)
Albumin: 4.4 g/dL (ref 3.5–5.2)
Alkaline Phosphatase: 58 units/L (ref 39–117)
Bilirubin, Direct: 0 mg/dL (ref 0.0–0.3)
Total Bilirubin: 0.6 mg/dL (ref 0.3–1.2)
Total Protein: 7.5 g/dL (ref 6.0–8.3)

## 2008-09-27 ENCOUNTER — Ambulatory Visit: Payer: Self-pay | Admitting: Internal Medicine

## 2008-09-27 LAB — CONVERTED CEMR LAB
INR: 2
Prothrombin Time: 17.6 s

## 2008-10-21 ENCOUNTER — Ambulatory Visit: Payer: Self-pay | Admitting: Internal Medicine

## 2008-10-21 LAB — CONVERTED CEMR LAB
INR: 2.9
Prothrombin Time: 20.5 s

## 2008-11-01 ENCOUNTER — Encounter: Payer: Self-pay | Admitting: Internal Medicine

## 2008-11-17 ENCOUNTER — Ambulatory Visit: Payer: Self-pay | Admitting: Internal Medicine

## 2008-11-17 LAB — CONVERTED CEMR LAB
INR: 2.7
Prothrombin Time: 19.8 s

## 2008-12-15 ENCOUNTER — Ambulatory Visit: Payer: Self-pay | Admitting: Internal Medicine

## 2008-12-15 LAB — CONVERTED CEMR LAB
INR: 2.7
Prothrombin Time: 19.9 s

## 2009-01-12 ENCOUNTER — Ambulatory Visit: Payer: Self-pay | Admitting: Internal Medicine

## 2009-01-12 LAB — CONVERTED CEMR LAB
INR: 3
Prothrombin Time: 20.9 s

## 2009-02-08 ENCOUNTER — Ambulatory Visit: Payer: Self-pay | Admitting: Internal Medicine

## 2009-02-08 LAB — CONVERTED CEMR LAB
INR: 3.3
Prothrombin Time: 22 s

## 2009-03-08 ENCOUNTER — Ambulatory Visit: Payer: Self-pay | Admitting: Internal Medicine

## 2009-03-08 LAB — CONVERTED CEMR LAB
INR: 3.3
Prothrombin Time: 21.8 s

## 2009-03-15 ENCOUNTER — Encounter (INDEPENDENT_AMBULATORY_CARE_PROVIDER_SITE_OTHER): Payer: Self-pay | Admitting: *Deleted

## 2009-04-12 ENCOUNTER — Ambulatory Visit: Payer: Self-pay | Admitting: Internal Medicine

## 2009-04-12 LAB — CONVERTED CEMR LAB
INR: 2.3
Prothrombin Time: 18.4 s

## 2009-04-27 ENCOUNTER — Ambulatory Visit: Payer: Self-pay | Admitting: Internal Medicine

## 2009-05-09 ENCOUNTER — Ambulatory Visit: Payer: Self-pay | Admitting: Internal Medicine

## 2009-05-09 LAB — CONVERTED CEMR LAB
INR: 2.4
Prothrombin Time: 19 s

## 2009-06-06 ENCOUNTER — Ambulatory Visit: Payer: Self-pay | Admitting: Internal Medicine

## 2009-06-06 LAB — CONVERTED CEMR LAB
INR: 2.3
Prothrombin Time: 18.5 s

## 2009-06-27 ENCOUNTER — Encounter: Payer: Self-pay | Admitting: Internal Medicine

## 2009-07-06 ENCOUNTER — Ambulatory Visit: Payer: Self-pay | Admitting: Internal Medicine

## 2009-07-06 LAB — CONVERTED CEMR LAB
INR: 2.8
Prothrombin Time: 20.2 s

## 2009-08-03 ENCOUNTER — Ambulatory Visit: Payer: Self-pay | Admitting: Internal Medicine

## 2009-08-03 DIAGNOSIS — I509 Heart failure, unspecified: Secondary | ICD-10-CM

## 2009-08-03 HISTORY — DX: Heart failure, unspecified: I50.9

## 2009-08-07 ENCOUNTER — Telehealth: Payer: Self-pay | Admitting: Internal Medicine

## 2009-08-07 ENCOUNTER — Encounter (INDEPENDENT_AMBULATORY_CARE_PROVIDER_SITE_OTHER): Payer: Self-pay | Admitting: *Deleted

## 2009-08-07 LAB — CONVERTED CEMR LAB
ALT: 29 units/L (ref 0–35)
AST: 37 units/L (ref 0–37)
Albumin: 4.4 g/dL (ref 3.5–5.2)
Alkaline Phosphatase: 56 units/L (ref 39–117)
BUN: 25 mg/dL — ABNORMAL HIGH (ref 6–23)
Basophils Absolute: 0 10*3/uL (ref 0.0–0.1)
Basophils Relative: 0.1 % (ref 0.0–3.0)
Bilirubin, Direct: 0 mg/dL (ref 0.0–0.3)
CO2: 30 meq/L (ref 19–32)
Calcium: 10 mg/dL (ref 8.4–10.5)
Chloride: 103 meq/L (ref 96–112)
Cholesterol: 197 mg/dL (ref 0–200)
Creatinine, Ser: 1.6 mg/dL — ABNORMAL HIGH (ref 0.4–1.2)
Eosinophils Absolute: 0.3 10*3/uL (ref 0.0–0.7)
Eosinophils Relative: 4 % (ref 0.0–5.0)
GFR calc non Af Amer: 35.06 mL/min (ref >60)
Glucose, Bld: 103 mg/dL — ABNORMAL HIGH (ref 70–99)
HCT: 34.5 % — ABNORMAL LOW (ref 36.0–46.0)
HDL: 65.4 mg/dL (ref >39.00)
Hemoglobin: 11.6 g/dL — ABNORMAL LOW (ref 12.0–15.0)
LDL Cholesterol: 106 mg/dL — ABNORMAL HIGH (ref 0–99)
Lymphocytes Relative: 17.3 % (ref 12.0–46.0)
Lymphs Abs: 1.5 10*3/uL (ref 0.7–4.0)
MCHC: 33.6 g/dL (ref 30.0–36.0)
MCV: 91.5 fL (ref 78.0–100.0)
Monocytes Absolute: 1 10*3/uL (ref 0.1–1.0)
Monocytes Relative: 11.7 % (ref 3.0–12.0)
Neutro Abs: 5.7 10*3/uL (ref 1.4–7.7)
Neutrophils Relative %: 66.9 % (ref 43.0–77.0)
Platelets: 378 10*3/uL (ref 150.0–400.0)
Potassium: 4.4 meq/L (ref 3.5–5.1)
RBC: 3.78 M/uL — ABNORMAL LOW (ref 3.87–5.11)
RDW: 13.9 % (ref 11.5–14.6)
Sodium: 141 meq/L (ref 135–145)
TSH: 0.69 microintl units/mL (ref 0.35–5.50)
Total Bilirubin: 0.6 mg/dL (ref 0.3–1.2)
Total CHOL/HDL Ratio: 3
Total Protein: 7.9 g/dL (ref 6.0–8.3)
Triglycerides: 130 mg/dL (ref 0.0–149.0)
VLDL: 26 mg/dL (ref 0.0–40.0)
WBC: 8.5 10*3/uL (ref 4.5–10.5)

## 2009-08-16 DIAGNOSIS — D509 Iron deficiency anemia, unspecified: Secondary | ICD-10-CM | POA: Insufficient documentation

## 2009-08-16 DIAGNOSIS — I456 Pre-excitation syndrome: Secondary | ICD-10-CM | POA: Insufficient documentation

## 2009-08-22 ENCOUNTER — Ambulatory Visit: Payer: Self-pay | Admitting: Internal Medicine

## 2009-08-31 ENCOUNTER — Ambulatory Visit: Payer: Self-pay | Admitting: Internal Medicine

## 2009-08-31 LAB — CONVERTED CEMR LAB
INR: 3
Prothrombin Time: 20.9 s

## 2009-09-28 ENCOUNTER — Ambulatory Visit: Payer: Self-pay | Admitting: Internal Medicine

## 2009-09-28 LAB — CONVERTED CEMR LAB
INR: 3.3
Prothrombin Time: 21.8 s

## 2009-10-26 ENCOUNTER — Ambulatory Visit: Payer: Self-pay | Admitting: Internal Medicine

## 2009-10-26 LAB — CONVERTED CEMR LAB: INR: 3.4

## 2009-11-02 ENCOUNTER — Encounter: Payer: Self-pay | Admitting: Internal Medicine

## 2009-11-22 ENCOUNTER — Ambulatory Visit: Payer: Self-pay | Admitting: Internal Medicine

## 2009-11-22 LAB — CONVERTED CEMR LAB: INR: 2.4

## 2009-12-20 ENCOUNTER — Ambulatory Visit: Payer: Self-pay | Admitting: Internal Medicine

## 2009-12-20 LAB — CONVERTED CEMR LAB: INR: 3.8

## 2009-12-26 LAB — CONVERTED CEMR LAB: Pap Smear: NORMAL

## 2010-01-03 ENCOUNTER — Ambulatory Visit: Payer: Self-pay | Admitting: Internal Medicine

## 2010-01-03 LAB — HM MAMMOGRAPHY

## 2010-01-04 LAB — CONVERTED CEMR LAB
ALT: 23 units/L (ref 0–35)
AST: 32 units/L (ref 0–37)
Albumin: 4.2 g/dL (ref 3.5–5.2)
Alkaline Phosphatase: 51 units/L (ref 39–117)
BUN: 25 mg/dL — ABNORMAL HIGH (ref 6–23)
Basophils Absolute: 0.1 10*3/uL (ref 0.0–0.1)
Basophils Relative: 1.1 % (ref 0.0–3.0)
Bilirubin, Direct: 0.1 mg/dL (ref 0.0–0.3)
CO2: 29 meq/L (ref 19–32)
Calcium: 9.3 mg/dL (ref 8.4–10.5)
Chloride: 104 meq/L (ref 96–112)
Cholesterol: 190 mg/dL (ref 0–200)
Creatinine, Ser: 1.2 mg/dL (ref 0.4–1.2)
Eosinophils Absolute: 0.4 10*3/uL (ref 0.0–0.7)
Eosinophils Relative: 5 % (ref 0.0–5.0)
GFR calc non Af Amer: 48.33 mL/min (ref >60)
Glucose, Bld: 74 mg/dL (ref 70–99)
HCT: 34.9 % — ABNORMAL LOW (ref 36.0–46.0)
HDL: 60.1 mg/dL (ref >39.00)
Hemoglobin: 11.5 g/dL — ABNORMAL LOW (ref 12.0–15.0)
LDL Cholesterol: 114 mg/dL — ABNORMAL HIGH (ref 0–99)
Lymphocytes Relative: 23.5 % (ref 12.0–46.0)
Lymphs Abs: 1.7 10*3/uL (ref 0.7–4.0)
MCHC: 33.1 g/dL (ref 30.0–36.0)
MCV: 92.4 fL (ref 78.0–100.0)
Monocytes Absolute: 1.1 10*3/uL — ABNORMAL HIGH (ref 0.1–1.0)
Monocytes Relative: 15.1 % — ABNORMAL HIGH (ref 3.0–12.0)
Neutro Abs: 3.9 10*3/uL (ref 1.4–7.7)
Neutrophils Relative %: 55.3 % (ref 43.0–77.0)
Platelets: 328 10*3/uL (ref 150.0–400.0)
Potassium: 4.3 meq/L (ref 3.5–5.1)
RBC: 3.77 M/uL — ABNORMAL LOW (ref 3.87–5.11)
RDW: 14.6 % (ref 11.5–14.6)
Sodium: 141 meq/L (ref 135–145)
TSH: 0.58 microintl units/mL (ref 0.35–5.50)
Total Bilirubin: 0.4 mg/dL (ref 0.3–1.2)
Total CHOL/HDL Ratio: 3
Total Protein: 6.8 g/dL (ref 6.0–8.3)
Triglycerides: 81 mg/dL (ref 0.0–149.0)
VLDL: 16.2 mg/dL (ref 0.0–40.0)
WBC: 7.1 10*3/uL (ref 4.5–10.5)

## 2010-01-30 ENCOUNTER — Encounter: Payer: Self-pay | Admitting: Internal Medicine

## 2010-02-01 ENCOUNTER — Ambulatory Visit: Payer: Self-pay | Admitting: Internal Medicine

## 2010-02-01 LAB — CONVERTED CEMR LAB: INR: 3.4

## 2010-02-02 ENCOUNTER — Telehealth: Payer: Self-pay | Admitting: Internal Medicine

## 2010-02-06 ENCOUNTER — Telehealth: Payer: Self-pay | Admitting: Internal Medicine

## 2010-03-01 ENCOUNTER — Ambulatory Visit: Payer: Self-pay | Admitting: Internal Medicine

## 2010-03-01 LAB — CONVERTED CEMR LAB: INR: 3.1

## 2010-04-05 ENCOUNTER — Ambulatory Visit: Payer: Self-pay | Admitting: Internal Medicine

## 2010-04-05 LAB — CONVERTED CEMR LAB: INR: 3.9

## 2010-04-26 ENCOUNTER — Ambulatory Visit: Payer: Self-pay | Admitting: Internal Medicine

## 2010-04-26 LAB — CONVERTED CEMR LAB: INR: 2.9

## 2010-06-01 ENCOUNTER — Ambulatory Visit
Admission: RE | Admit: 2010-06-01 | Discharge: 2010-06-01 | Payer: Self-pay | Source: Home / Self Care | Attending: Internal Medicine | Admitting: Internal Medicine

## 2010-06-01 LAB — CONVERTED CEMR LAB: INR: 3.5

## 2010-06-15 ENCOUNTER — Ambulatory Visit
Admission: RE | Admit: 2010-06-15 | Discharge: 2010-06-15 | Payer: Self-pay | Source: Home / Self Care | Attending: Internal Medicine | Admitting: Internal Medicine

## 2010-06-15 LAB — CONVERTED CEMR LAB: INR: 3.3

## 2010-06-26 NOTE — Assessment & Plan Note (Signed)
Summary: pt/Grace [name] pt rsc/Grace [name]   Nurse Visit   Allergies: No Known Drug Allergies Laboratory Results   Blood Tests      INR: 3.9   (Normal Range: 0.88-1.12   Therap INR: 2.0-3.5) Comments: Rita Ohara  April 05, 2010 10:42 AM     Orders Added: 1)  Est. Patient Level I [99211] 2)  Protime [16109UE]   ANTICOAGULATION RECORD PREVIOUS REGIMEN & LAB RESULTS Anticoagulation Diagnosis:  Cardiac valve eplacement (mechanical) on  07/28/2007 Previous INR Goal Range:  2.5-3.5 on  10/26/2009 Previous INR:  3.1 on  03/01/2010 Previous Coumadin Dose(mg):  7.5mg  on mon,thurs, 5mg  on other days on  02/01/2010 Previous Regimen:  same on  03/01/2010 Previous Coagulation Comments:  hold for 1 day on  05/08/2007  NEW REGIMEN & LAB RESULTS Current INR: 3.9 Regimen: hold 2 days then resume   Repeat testing in: 3 weeks  Anticoagulation Visit Questionnaire Coumadin dose missed/changed:  No Abnormal Bleeding Symptoms:  Yes    Bruising or bleeding from nose or gums, in urine or stool since the last visit:  Alittle more bruising Any diet changes including alcohol intake, vegetables or greens since the last visit:  No Any illnesses or hospitalizations since the last visit:  No Any signs of clotting since the last visit (including chest discomfort, dizziness, shortness of breath, arm tingling, slurred speech, swelling or redness in leg):  No  MEDICATIONS ASPIR-81 81 MG TBEC (ASPIRIN) Take 1 tablet by mouth once a day COLACE 100 MG CAPS (DOCUSATE SODIUM) Take 1 capsule by mouth twice a day WARFARIN SODIUM 5 MG TABS (WARFARIN SODIUM) Take 1 tablet by mouth once daily as directed FUROSEMIDE 40 MG  TABS (FUROSEMIDE) Take 1  tablet by mouth one time a day LEVOTHYROXINE SODIUM 125 MCG TABS (LEVOTHYROXINE SODIUM) Take 1 tablet by mouth once a day POTASSIUM CHLORIDE CRYS CR 20 MEQ TBCR (POTASSIUM CHLORIDE CRYS CR) once daily TOPROL XL 50 MG TB24 (METOPROLOL SUCCINATE) Take 1 tablet by mouth two  times a day CALTRATE 600+D 600-400 MG-UNIT  TABS (CALCIUM CARBONATE-VITAMIN D) Take 1 tablet by mouth three times a day CENTRUM SILVER   TABS (MULTIPLE VITAMINS-MINERALS) once daily ACETAMINOPHEN 500 MG CAPS (ACETAMINOPHEN) 1 capsule by mouth as needed TYLENOL PM EXTRA STRENGTH 500-25 MG TABS (DIPHENHYDRAMINE-APAP (SLEEP)) 1 tablet by mouth at bedtime as needed EXCEDRIN EXTRA STRENGTH 250-250-65 MG TABS (ASPIRIN-ACETAMINOPHEN-CAFFEINE) take as needed LOSARTAN POTASSIUM 50 MG TABS (LOSARTAN POTASSIUM) One tablet daily in PM

## 2010-06-26 NOTE — Assessment & Plan Note (Signed)
Summary: pt//ccm   Nurse Visit   Allergies: No Known Drug Allergies Laboratory Results   Blood Tests   Date/Time Received: October 26, 2009 10:06 AM  Date/Time Reported: October 26, 2009 10:06 AM    INR: 3.4   (Normal Range: 0.88-1.12   Therap INR: 2.0-3.5) Comments: Wynona Canes, CMA  October 26, 2009 10:06 AM      Orders Added: 1)  Est. Patient Level I [99211] 2)  Protime [16109UE]  Laboratory Results   Blood Tests      INR: 3.4   (Normal Range: 0.88-1.12   Therap INR: 2.0-3.5) Comments: Wynona Canes, CMA  October 26, 2009 10:06 AM        ANTICOAGULATION RECORD PREVIOUS REGIMEN & LAB RESULTS Anticoagulation Diagnosis:  Cardiac valve eplacement (mechanical) on  07/28/2007 Previous INR Goal Range:  2-3 on  10/12/2007 Previous INR:  3.3 on  09/28/2009 Previous Coumadin Dose(mg):  7.5mg  on tue,thu 5mg  on other days on  09/28/2009 Previous Regimen:  same on  08/31/2009 Previous Coagulation Comments:  hold for 1 day on  05/08/2007  NEW REGIMEN & LAB RESULTS Current INR Goal Range: 2.5-3.5 Current INR: 3.4 Regimen: same  (no change)       Repeat testing in: 4 weeks MEDICATIONS ASPIR-81 81 MG TBEC (ASPIRIN) Take 1 tablet by mouth once a day COLACE 100 MG CAPS (DOCUSATE SODIUM) Take 1 capsule by mouth twice a day WARFARIN SODIUM 5 MG TABS (WARFARIN SODIUM) 1 and 1/2   once daily or  as directed. FUROSEMIDE 40 MG  TABS (FUROSEMIDE) Take 1  tablet by mouth one time a day LEVOTHYROXINE SODIUM 125 MCG TABS (LEVOTHYROXINE SODIUM) Take 1 tablet by mouth once a day POTASSIUM CHLORIDE CRYS CR 20 MEQ TBCR (POTASSIUM CHLORIDE CRYS CR) once daily TOPROL XL 50 MG TB24 (METOPROLOL SUCCINATE) Take 1 tablet by mouth two times a day CALTRATE 600+D 600-400 MG-UNIT  TABS (CALCIUM CARBONATE-VITAMIN D) Take 1 tablet by mouth three times a day CENTRUM SILVER   TABS (MULTIPLE VITAMINS-MINERALS) once daily ACETAMINOPHEN 500 MG CAPS (ACETAMINOPHEN) 1 capsule by mouth as needed TYLENOL  PM EXTRA STRENGTH 500-25 MG TABS (DIPHENHYDRAMINE-APAP (SLEEP)) 1 tablet by mouth at bedtime as needed EXCEDRIN EXTRA STRENGTH 250-250-65 MG TABS (ASPIRIN-ACETAMINOPHEN-CAFFEINE) take as needed   Anticoagulation Visit Questionnaire      Coumadin dose missed/changed:  No      Abnormal Bleeding Symptoms:  No   Any diet changes including alcohol intake, vegetables or greens since the last visit:  No Any illnesses or hospitalizations since the last visit:  No Any signs of clotting since the last visit (including chest discomfort, dizziness, shortness of breath, arm tingling, slurred speech, swelling or redness in leg):  No

## 2010-06-26 NOTE — Assessment & Plan Note (Signed)
Summary: fingernail/heel check/pt will come in fasting/pt/njr   Vital Signs:  Patient profile:   60 year old female Weight:      145 pounds Temp:     97.9 degrees F oral Pulse rate:   80 / minute Pulse rhythm:   regular Resp:     12 per minute BP sitting:   132 / 80  (left arm) Cuff size:   regular  Vitals Entered By: Gladis Riffle, RN (January 03, 2010 7:53 AM) CC: discuss fingernails and lump at arch of right foot, fasting and needs PT Is Patient Diabetic? No   Primary Care Provider:  Birdie Sons, MD  CC:  discuss fingernails and lump at arch of right foot and fasting and needs PT.  History of Present Illness:  Follow-Up Visit      This is a 60 year old woman who presents for Follow-up visit.  The patient denies chest pain and palpitations.  Since the last visit the patient notes no new problems or concerns except has used a different fingernail polish and has developed more fragile nails and separation of nail bed. also has developed a mass on the bottom of left foot---rarely painful. The patient reports taking meds as prescribed.  When questioned about possible medication side effects, the patient notes none.    Preventive Screening-Counseling & Management  Alcohol-Tobacco     Smoking Status: never  Current Medications (verified): 1)  Aspir-81 81 Mg Tbec (Aspirin) .... Take 1 Tablet By Mouth Once A Day 2)  Colace 100 Mg Caps (Docusate Sodium) .... Take 1 Capsule By Mouth Twice A Day 3)  Warfarin Sodium 5 Mg Tabs (Warfarin Sodium) .... Take 1 Tablet By Mouth Once Daily As Directed 4)  Furosemide 40 Mg  Tabs (Furosemide) .... Take 1  Tablet By Mouth One Time A Day 5)  Levothyroxine Sodium 125 Mcg Tabs (Levothyroxine Sodium) .... Take 1 Tablet By Mouth Once A Day 6)  Potassium Chloride Crys Cr 20 Meq Tbcr (Potassium Chloride Crys Cr) .... Once Daily 7)  Toprol Xl 50 Mg Tb24 (Metoprolol Succinate) .... Take 1 Tablet By Mouth Two Times A Day 8)  Caltrate 600+d 600-400 Mg-Unit   Tabs (Calcium Carbonate-Vitamin D) .... Take 1 Tablet By Mouth Three Times A Day 9)  Centrum Silver   Tabs (Multiple Vitamins-Minerals) .... Once Daily 10)  Acetaminophen 500 Mg Caps (Acetaminophen) .Marland Kitchen.. 1 Capsule By Mouth As Needed 11)  Tylenol Pm Extra Strength 500-25 Mg Tabs (Diphenhydramine-Apap (Sleep)) .Marland Kitchen.. 1 Tablet By Mouth At Bedtime As Needed 12)  Excedrin Extra Strength 250-250-65 Mg Tabs (Aspirin-Acetaminophen-Caffeine) .... Take As Needed 13)  Losartan Potassium 50 Mg Tabs (Losartan Potassium) .... One Tablet Daily in Pm  Allergies (verified): No Known Drug Allergies  Physical Exam  General:  alert and well-developed.   Head:  normocephalic and atraumatic.   Eyes:  pupils equal and pupils round.   Ears:  R ear normal and L ear normal.   Neck:  No deformities, masses, or tenderness noted.no JVD.   Lungs:  normal respiratory effort and no intercostal retractions.   Heart:  normal rate and regular rhythm.  mechanical heart sounds Abdomen:  soft and non-tender.   Skin:  turgor normal and color normal.   onycholysis several fingers of both hands Psych:  normally interactive and good eye contact.     Impression & Recommendations:  Problem # 1:  CONGESTIVE HEART FAILURE (ICD-428.0) clnically stable Her updated medication list for this problem includes:  Aspir-81 81 Mg Tbec (Aspirin) .Marland Kitchen... Take 1 tablet by mouth once a day    Warfarin Sodium 5 Mg Tabs (Warfarin sodium) .Marland Kitchen... Take 1 tablet by mouth once daily as directed    Furosemide 40 Mg Tabs (Furosemide) .Marland Kitchen... Take 1  tablet by mouth one time a day    Toprol Xl 50 Mg Tb24 (Metoprolol succinate) .Marland Kitchen... Take 1 tablet by mouth two times a day    Losartan Potassium 50 Mg Tabs (Losartan potassium) ..... One tablet daily in pm  Problem # 2:  ENCOUNTER FOR THERAPEUTIC DRUG MONITORING (ICD-V58.83)  Orders: Protime (24401UU)  Problem # 3:  HYPOTHYROIDISM (ICD-244.9)  Her updated medication list for this problem includes:     Levothyroxine Sodium 125 Mcg Tabs (Levothyroxine sodium) .Marland Kitchen... Take 1 tablet by mouth once a day  Labs Reviewed: TSH: 0.69 (08/03/2009)    Chol: 197 (08/03/2009)   HDL: 65.40 (08/03/2009)   LDL: 106 (08/03/2009)   TG: 130.0 (08/03/2009)  Problem # 4:  ANEMIA-NOS (ICD-285.9) check today  Complete Medication List: 1)  Aspir-81 81 Mg Tbec (Aspirin) .... Take 1 tablet by mouth once a day 2)  Colace 100 Mg Caps (Docusate sodium) .... Take 1 capsule by mouth twice a day 3)  Warfarin Sodium 5 Mg Tabs (Warfarin sodium) .... Take 1 tablet by mouth once daily as directed 4)  Furosemide 40 Mg Tabs (Furosemide) .... Take 1  tablet by mouth one time a day 5)  Levothyroxine Sodium 125 Mcg Tabs (Levothyroxine sodium) .... Take 1 tablet by mouth once a day 6)  Potassium Chloride Crys Cr 20 Meq Tbcr (Potassium chloride crys cr) .... Once daily 7)  Toprol Xl 50 Mg Tb24 (Metoprolol succinate) .... Take 1 tablet by mouth two times a day 8)  Caltrate 600+d 600-400 Mg-unit Tabs (Calcium carbonate-vitamin d) .... Take 1 tablet by mouth three times a day 9)  Centrum Silver Tabs (Multiple vitamins-minerals) .... Once daily 10)  Acetaminophen 500 Mg Caps (Acetaminophen) .Marland Kitchen.. 1 capsule by mouth as needed 11)  Tylenol Pm Extra Strength 500-25 Mg Tabs (Diphenhydramine-apap (sleep)) .Marland Kitchen.. 1 tablet by mouth at bedtime as needed 12)  Excedrin Extra Strength 250-250-65 Mg Tabs (Aspirin-acetaminophen-caffeine) .... Take as needed 13)  Losartan Potassium 50 Mg Tabs (Losartan potassium) .... One tablet daily in pm  Other Orders: Venipuncture (72536) TLB-CBC Platelet - w/Differential (85025-CBCD) TLB-Lipid Panel (80061-LIPID) TLB-Hepatic/Liver Function Pnl (80076-HEPATIC) TLB-TSH (Thyroid Stimulating Hormone) (84443-TSH) TLB-BMP (Basic Metabolic Panel-BMET) (80048-METABOL)  Patient Instructions: 1)  Please schedule a follow-up appointment in 6 months.   Preventive Care Screening  Pap Smear:    Date:  12/26/2009     Next Due:  12/2012    Results:  normal per pt   Mammogram:    Date:  11/02/2009    Next Due:  10/2011    Results:  stable     Preventive Care Screening  Pap Smear:    Date:  12/26/2009    Next Due:  12/2012    Results:  normal per pt   Mammogram:    Date:  11/02/2009    Next Due:  10/2011    Results:  stable   Appended Document: Orders Update     Clinical Lists Changes  Orders: Added new Service order of Specimen Handling ([id number]) - Signed Observations: Added new observation of NEXT PT: 4 weeks (01/03/2010 8:33) Added new observation of CUR. REGIMEN: same (01/03/2010 8:33) Added new observation of INR: 2.9  (01/03/2010 8:33) Added new observation of COMMENTS2: Pryor Curia  Thrasher  January 03, 2010 8:34 AM   (01/03/2010 8:33) Added new observation of ABNORM BLEED: No  (01/03/2010 8:33) Added new observation of COUMADIN CHG: No  (01/03/2010 8:33)       ANTICOAGULATION RECORD PREVIOUS REGIMEN & LAB RESULTS Anticoagulation Diagnosis:  Cardiac valve eplacement (mechanical) on  07/28/2007 Previous INR Goal Range:  2.5-3.5 on  10/26/2009 Previous INR:  3.8 on  12/20/2009 Previous Coumadin Dose(mg):  7.5mg  on tue,thu 5mg  on other days on  09/28/2009 Previous Regimen:  5mg . QD on  12/20/2009 Previous Coagulation Comments:  hold for 1 day on  05/08/2007  NEW REGIMEN & LAB RESULTS Current INR: 2.9 Regimen: same  Repeat testing in: 4 weeks  Anticoagulation Visit Questionnaire Coumadin dose missed/changed:  No Abnormal Bleeding Symptoms:  No  Any diet changes including alcohol intake, vegetables or greens since the last visit:  No Any illnesses or hospitalizations since the last visit:  No Any signs of clotting since the last visit (including chest discomfort, dizziness, shortness of breath, arm tingling, slurred speech, swelling or redness in leg):  No  MEDICATIONS ASPIR-81 81 MG TBEC (ASPIRIN) Take 1 tablet by mouth once a day COLACE 100 MG CAPS (DOCUSATE SODIUM)  Take 1 capsule by mouth twice a day WARFARIN SODIUM 5 MG TABS (WARFARIN SODIUM) Take 1 tablet by mouth once daily as directed FUROSEMIDE 40 MG  TABS (FUROSEMIDE) Take 1  tablet by mouth one time a day LEVOTHYROXINE SODIUM 125 MCG TABS (LEVOTHYROXINE SODIUM) Take 1 tablet by mouth once a day POTASSIUM CHLORIDE CRYS CR 20 MEQ TBCR (POTASSIUM CHLORIDE CRYS CR) once daily TOPROL XL 50 MG TB24 (METOPROLOL SUCCINATE) Take 1 tablet by mouth two times a day CALTRATE 600+D 600-400 MG-UNIT  TABS (CALCIUM CARBONATE-VITAMIN D) Take 1 tablet by mouth three times a day CENTRUM SILVER   TABS (MULTIPLE VITAMINS-MINERALS) once daily ACETAMINOPHEN 500 MG CAPS (ACETAMINOPHEN) 1 capsule by mouth as needed TYLENOL PM EXTRA STRENGTH 500-25 MG TABS (DIPHENHYDRAMINE-APAP (SLEEP)) 1 tablet by mouth at bedtime as needed EXCEDRIN EXTRA STRENGTH 250-250-65 MG TABS (ASPIRIN-ACETAMINOPHEN-CAFFEINE) take as needed LOSARTAN POTASSIUM 50 MG TABS (LOSARTAN POTASSIUM) One tablet daily in PM    Laboratory Results   Blood Tests      INR: 2.9   (Normal Range: 0.88-1.12   Therap INR: 2.0-3.5) Comments: Rita Ohara  January 03, 2010 8:34 AM

## 2010-06-26 NOTE — Assessment & Plan Note (Signed)
Summary: NEEDS A COLONOSCOPY/PT. IS ON COUMADIN          Grace [name]    History of Present Illness Visit Type: Initial Consult Primary GI MD: Lina Sar MD Primary Provider: Birdie Sons, MD Requesting Provider: Birdie Sons, MD Chief Complaint: Consult for colonoscopy on Coumadin  No GI problems at this time History of Present Illness:   This is a 60 year old white female who is here to discuss having a colonoscopy. She had a normal colonoscopy in 1998 and again in September 2004. There is no family history of colon cancer. She has been on Coumadin because of an aortic and mitral valve replacement in January 2008 for radiation valvulopathy. There is a history of Hodgkin's disease in 1974 for which she is status post radiation and chemotherapy. She is also status post thoracotomy and splenectomy. She had breast cancer and asymptomatic WPW. 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 Her last hemoglobin was 11.3 and her hematocrit was 30.4. Patient denies any current GI symptoms.   GI Review of Systems    Reports chest pain.      Denies abdominal pain, acid reflux, belching, bloating, dysphagia with liquids, dysphagia with solids, heartburn, loss of appetite, nausea, vomiting, vomiting blood, weight loss, and  weight gain.      Reports constipation and  hemorrhoids.     Denies anal fissure, black tarry stools, change in bowel habit, diarrhea, diverticulosis, fecal incontinence, heme positive stool, irritable bowel syndrome, jaundice, light color stool, liver problems, rectal bleeding, and  rectal pain.    Current Medications (verified): 1)  Aspir-81 81 Mg Tbec (Aspirin) .... Take 1 Tablet By Mouth Once A Day 2)  Colace 100 Mg Caps (Docusate Sodium) .... Take 1 Capsule By Mouth Twice A Day 3)  Warfarin Sodium 5 Mg Tabs (Warfarin Sodium) .Marland Kitchen.. 1 and 1/2   Once Daily or  As Directed. 4)  Furosemide 40 Mg  Tabs (Furosemide) .... Take 1  Tablet By Mouth  One Time A Day 5)  Levothyroxine Sodium 125 Mcg Tabs (Levothyroxine Sodium) .... Take 1 Tablet By Mouth Once A Day 6)  Potassium Chloride Crys Cr 20 Meq Tbcr (Potassium Chloride Crys Cr) .... Once Daily 7)  Toprol Xl 50 Mg Tb24 (Metoprolol Succinate) .... Take 1 Tablet By Mouth Two Times A Day 8)  Caltrate 600+d 600-400 Mg-Unit  Tabs (Calcium Carbonate-Vitamin D) .... Take 1 Tablet By Mouth Three Times A Day 9)  Centrum Silver   Tabs (Multiple Vitamins-Minerals) .... Once Daily 10)  Acetaminophen 500 Mg Caps (Acetaminophen) .Marland Kitchen.. 1 Capsule By Mouth As Needed 11)  Tylenol Pm Extra Strength 500-25 Mg Tabs (Diphenhydramine-Apap (Sleep)) .Marland Kitchen.. 1 Tablet By Mouth At Bedtime As Needed 12)  Excedrin Extra Strength 250-250-65 Mg Tabs (Aspirin-Acetaminophen-Caffeine) .... Take As Needed  Allergies (verified): No Known Drug Allergies  Past History:  Past Medical History: Anemia Breast cancer, hx of--S/P chemo X2 Hypothyroidism GI bleed (NSAIDS) endometrial ablation migraines Hodgkins disease--chemotherapy and RO RT WPW  (CV eval 04/00-mitral regurge--SBE prophylaxis aortic insufficiency (radioactive Hyperlipidemia Congestive heart failure  Past Surgical History: Reviewed history from 08/16/2009 and no changes required. Aortic Valve Replacement: -06/13/06 Mastectomy L--breast tissue removed R Mitral valve replacement--06/13/06 Splenectomy--exploratory lap thoracotomy radiation valvulopathy--06/13/06 Pericardial stripping --01/2007 T & A endometrial ablation bilateral breast implants  Family History: Reviewed history from 08/16/2009 and no changes required. father-deceased gun shot wound mother alive and well Family History of Heart Disease: Mother, maternal grandmother, maternal aunt Family History of  Diabetes: Maternal Grandmother Family History of Breast Cancer: Mother Family History of Uterine Cancer: Maternal Aunt  Social History: Reviewed history from 04/27/2007 and no  changes required. Occupation:disability from teaching Married Never Smoked Regular exercise-no Alcohol Use - no Illicit Drug Use - no Drug Use:  no  Review of Systems       The patient complains of heart rhythm changes and muscle pains/cramps.  The patient denies allergy/sinus, anemia, anxiety-new, arthritis/joint pain, back pain, blood in urine, breast changes/lumps, change in vision, confusion, cough, coughing up blood, depression-new, fainting, fatigue, fever, headaches-new, hearing problems, heart murmur, itching, menstrual pain, night sweats, nosebleeds, pregnancy symptoms, shortness of breath, skin rash, sleeping problems, sore throat, swelling of feet/legs, swollen lymph glands, thirst - excessive , urination - excessive , urination changes/pain, urine leakage, vision changes, and voice change.         Pertinent positive and negative review of systems were noted in the above HPI. All other ROS was otherwise negative.   Vital Signs:  Patient profile:   60 year old female Height:      66 inches Weight:      141 pounds BMI:     22.84 Pulse rate:   80 / minute Pulse rhythm:   regular BP sitting:   98 / 68  (left arm)  Vitals Entered By: Milford Cage NCMA (August 22, 2009 2:22 PM)  Physical Exam  General:  Well developed, well nourished, no acute distress. Eyes:  PERRLA, no icterus. Mouth:  No deformity or lesions, dentition normal. Chest Wall:  status post thoracotomy. Lungs:  Clear throughout to auscultation. Heart:  prosthetic valve sounds. Abdomen:  Soft, nontender and nondistended. No masses, hepatosplenomegaly or hernias noted. Normal bowel sounds. Rectal:  soft Hemoccult negative stool. Extremities:  No clubbing, cyanosis, edema or deformities noted. Skin:  Intact without significant lesions or rashes. Psych:  Alert and cooperative. Normal mood and affect.   Impression & Recommendations:  Problem # 1:  WOLFF (WOLFE)-PARKINSON-WHITE (WPW) SYNDROME  (ICD-426.7) Patient is currently asymptomatic.  Problem # 2:  MITRAL VALVE REPLACEMENT, HX OF (ICD-V15.1) Patient is at high risk for conscious sedation. She would need Lovenox or a heparin bridge for her colonoscopy.  Problem # 3:  SCREENING, COLON CANCER (ICD-V76.51) There is no family history of colon cancer and the patient has had 2 normal colonoscopies in 1998 and in 2004. Her next recall colonoscopy according to guidelines would be in June 2014. Because of the complications of bridging her with Lovenox or heparin, I suggested she undergo a virtual colonoscopy in place of a direct colonoscopy. Patient agrees with the plan. We will send her a recall letter in 3 years.  Patient Instructions: 1)  recall colorectal screening in June 2014. 2)  Prefer to do virtual colonoscopy to reduce the risk of possible complications of bridging from Coumadin to heparin. 3)  Copy sent to :Dr Birdie Sons  4)  The medication list was reviewed and reconciled.  All changed / newly prescribed medications were explained.  A complete medication list was provided to the patient / caregiver.

## 2010-06-26 NOTE — Letter (Signed)
Summary: New Patient letter  [facility]  [address] [place], [place] [address]   Phone: [phone]  Fax: [phone]       08/07/2009 MRN: [id number]  Grace [name] [address] [place], [place]  [address]  Dear Ms. Laham,  Welcome to the Gastroenterology Division at [facility].    You are scheduled to see Dr. Lina Sar on 08-22-09 at 2:30pm, on the 3rd floor at [facility], [address].  We ask that you try to arrive at our office 15 minutes prior to your appointment time to allow for check-in.  We would like you to complete the enclosed self-administered evaluation form prior to your visit and bring it with you on the day of your appointment.  We will review it with you.  Also, please bring a complete list of all your medications or, if you prefer, bring the medication bottles and we will list them.  Please bring your insurance card so that we may make a copy of it.  If your insurance requires a referral to see a specialist, please bring your referral form from your primary care physician.  Co-payments are due at the time of your visit and may be paid by cash, check or credit card.     Your office visit will consist of a consult with your physician (includes a physical exam), any laboratory testing he/she may order, scheduling of any necessary diagnostic testing (e.g. x-ray, ultrasound, CT-scan), and scheduling of a procedure (e.g. Endoscopy, Colonoscopy) if required.  Please allow enough time on your schedule to allow for any/all of these possibilities.    If you cannot keep your appointment, please call [phone] to cancel or reschedule prior to your appointment date.  This allows [place] the opportunity to schedule an appointment for another patient in need of care.  If you do not cancel or reschedule by 5 p.m. the business day prior to your appointment date, you will be charged a $50.00 late cancellation/no-show fee.    Thank you for  choosing [facility] for your medical needs.  We appreciate the opportunity to care for you.  Please visit [place] at our website  to learn more about our practice.                     Sincerely,                                                             The Gastroenterology Division   Appended Document: New Patient letter Letter mailed to patient.

## 2010-06-26 NOTE — Assessment & Plan Note (Signed)
Summary: PT/NJR   Nurse Visit   Allergies: No Known Drug Allergies Laboratory Results   Blood Tests     PT: 20.9 s   (Normal Range: 10.6-13.4)  INR: 3.0   (Normal Range: 0.88-1.12   Therap INR: 2.0-3.5) Comments: Rita Ohara  August 31, 2009 10:26 AM     Orders Added: 1)  Est. Patient Level I [99211] 2)  Protime [[id number]]   ANTICOAGULATION RECORD PREVIOUS REGIMEN & LAB RESULTS Anticoagulation Diagnosis:  Cardiac valve eplacement (mechanical) on  07/28/2007 Previous INR Goal Range:  2-3 on  10/12/2007 Previous INR:  2.9 on  08/03/2009 Previous Coumadin Dose(mg):  5MG  QD on  10/21/2008 Previous Regimen:  same on  08/03/2009 Previous Coagulation Comments:  hold for 1 day on  05/08/2007  NEW REGIMEN & LAB RESULTS Current INR: 3.0 Regimen: same  Repeat testing in: 1 month  Anticoagulation Visit Questionnaire Coumadin dose missed/changed:  No Abnormal Bleeding Symptoms:  No  Any diet changes including alcohol intake, vegetables or greens since the last visit:  No Any illnesses or hospitalizations since the last visit:  No Any signs of clotting since the last visit (including chest discomfort, dizziness, shortness of breath, arm tingling, slurred speech, swelling or redness in leg):  Yes  MEDICATIONS ASPIR-81 81 MG TBEC (ASPIRIN) Take 1 tablet by mouth once a day COLACE 100 MG CAPS (DOCUSATE SODIUM) Take 1 capsule by mouth twice a day WARFARIN SODIUM 5 MG TABS (WARFARIN SODIUM) 1 and 1/2   once daily or  as directed. FUROSEMIDE 40 MG  TABS (FUROSEMIDE) Take 1  tablet by mouth one time a day LEVOTHYROXINE SODIUM 125 MCG TABS (LEVOTHYROXINE SODIUM) Take 1 tablet by mouth once a day POTASSIUM CHLORIDE CRYS CR 20 MEQ TBCR (POTASSIUM CHLORIDE CRYS CR) once daily TOPROL XL 50 MG TB24 (METOPROLOL SUCCINATE) Take 1 tablet by mouth two times a day CALTRATE 600+D 600-400 MG-UNIT  TABS (CALCIUM CARBONATE-VITAMIN D) Take 1 tablet by mouth three times a day CENTRUM SILVER    TABS (MULTIPLE VITAMINS-MINERALS) once daily ACETAMINOPHEN 500 MG CAPS (ACETAMINOPHEN) 1 capsule by mouth as needed TYLENOL PM EXTRA STRENGTH 500-25 MG TABS (DIPHENHYDRAMINE-APAP (SLEEP)) 1 tablet by mouth at bedtime as needed EXCEDRIN EXTRA STRENGTH 250-250-65 MG TABS (ASPIRIN-ACETAMINOPHEN-CAFFEINE) take as needed

## 2010-06-26 NOTE — Consult Note (Signed)
Summary: [facility]  [facility]   Imported By: Maryln Gottron 02/08/2010 13:56:22  _____________________________________________________________________  External Attachment:    Type:   Image     Comment:   External Document

## 2010-06-26 NOTE — Progress Notes (Signed)
  Phone Note Call from Patient   Caller: Patient Call For: Birdie Sons MD Summary of Call: Pt plans to DC the Lorsartan as she feels better.  Wants to thank Dr. Cato Mulligan for returning her call. Initial call taken by: Lynann Beaver CMA,  February 06, 2010 11:14 AM

## 2010-06-26 NOTE — Assessment & Plan Note (Signed)
Summary: pt//ccm   Nurse Visit   Allergies: No Known Drug Allergies Laboratory Results   Blood Tests   Date/Time Received: Sep 28, 2009 12:15 PM  Date/Time Reported: Sep 28, 2009 12:15 PM   PT: 21.8 s   (Normal Range: 10.6-13.4)  INR: 3.3   (Normal Range: 0.88-1.12   Therap INR: 2.0-3.5) Comments: Wynona Canes, CMA  Sep 28, 2009 12:15 PM      Orders Added: 1)  Est. Patient Level I [16109] 2)  Protime [[id number]]  Laboratory Results   Blood Tests     PT: 21.8 s   (Normal Range: 10.6-13.4)  INR: 3.3   (Normal Range: 0.88-1.12   Therap INR: 2.0-3.5) Comments: Wynona Canes, CMA  Sep 28, 2009 12:15 PM        ANTICOAGULATION RECORD PREVIOUS REGIMEN & LAB RESULTS Anticoagulation Diagnosis:  Cardiac valve eplacement (mechanical) on  07/28/2007 Previous INR Goal Range:  2-3 on  10/12/2007 Previous INR:  3.0 on  08/31/2009 Previous Coumadin Dose(mg):  5MG  QD on  10/21/2008 Previous Regimen:  same on  08/31/2009 Previous Coagulation Comments:  hold for 1 day on  05/08/2007  NEW REGIMEN & LAB RESULTS Current INR: 3.3 Current Coumadin Dose(mg): 7.5mg  on tue,thu 5mg  on other days Regimen: same  (no change)       Repeat testing in: 4 weeks MEDICATIONS ASPIR-81 81 MG TBEC (ASPIRIN) Take 1 tablet by mouth once a day COLACE 100 MG CAPS (DOCUSATE SODIUM) Take 1 capsule by mouth twice a day WARFARIN SODIUM 5 MG TABS (WARFARIN SODIUM) 1 and 1/2   once daily or  as directed. FUROSEMIDE 40 MG  TABS (FUROSEMIDE) Take 1  tablet by mouth one time a day LEVOTHYROXINE SODIUM 125 MCG TABS (LEVOTHYROXINE SODIUM) Take 1 tablet by mouth once a day POTASSIUM CHLORIDE CRYS CR 20 MEQ TBCR (POTASSIUM CHLORIDE CRYS CR) once daily TOPROL XL 50 MG TB24 (METOPROLOL SUCCINATE) Take 1 tablet by mouth two times a day CALTRATE 600+D 600-400 MG-UNIT  TABS (CALCIUM CARBONATE-VITAMIN D) Take 1 tablet by mouth three times a day CENTRUM SILVER   TABS (MULTIPLE VITAMINS-MINERALS) once  daily ACETAMINOPHEN 500 MG CAPS (ACETAMINOPHEN) 1 capsule by mouth as needed TYLENOL PM EXTRA STRENGTH 500-25 MG TABS (DIPHENHYDRAMINE-APAP (SLEEP)) 1 tablet by mouth at bedtime as needed EXCEDRIN EXTRA STRENGTH 250-250-65 MG TABS (ASPIRIN-ACETAMINOPHEN-CAFFEINE) take as needed   Anticoagulation Visit Questionnaire      Coumadin dose missed/changed:  No      Abnormal Bleeding Symptoms:  No   Any diet changes including alcohol intake, vegetables or greens since the last visit:  No Any illnesses or hospitalizations since the last visit:  No Any signs of clotting since the last visit (including chest discomfort, dizziness, shortness of breath, arm tingling, slurred speech, swelling or redness in leg):  No

## 2010-06-26 NOTE — Procedures (Signed)
Summary: COLON  Colonoscopy Report/[facility]   Imported By: Maryln Gottron 08/04/2009 15:22:27  _____________________________________________________________________  External Attachment:    Type:   Image     Comment:   External Document

## 2010-06-26 NOTE — Letter (Signed)
Summary: [facility]  [facility]   Imported By: Maryln Gottron 07/11/2009 14:14:57  _____________________________________________________________________  External Attachment:    Type:   Image     Comment:   External Document

## 2010-06-26 NOTE — Assessment & Plan Note (Signed)
Summary: pt/njr   Nurse Visit   Allergies: No Known Drug Allergies Laboratory Results   Blood Tests   Date/Time Received: November 22, 2009 9:56 AM  Date/Time Reported: November 22, 2009 9:56 AM    INR: 2.4   (Normal Range: 0.88-1.12   Therap INR: 2.0-3.5) Comments: Wynona Canes, CMA  November 22, 2009 9:56 AM     Orders Added: 1)  Est. Patient Level I [99211] 2)  Protime [[id number]]  Laboratory Results   Blood Tests      INR: 2.4   (Normal Range: 0.88-1.12   Therap INR: 2.0-3.5) Comments: Wynona Canes, CMA  November 22, 2009 9:56 AM       ANTICOAGULATION RECORD PREVIOUS REGIMEN & LAB RESULTS Anticoagulation Diagnosis:  Cardiac valve eplacement (mechanical) on  07/28/2007 Previous INR Goal Range:  2.5-3.5 on  10/26/2009 Previous INR:  3.4 on  10/26/2009 Previous Coumadin Dose(mg):  7.5mg  on tue,thu 5mg  on other days on  09/28/2009 Previous Regimen:  same on  08/31/2009 Previous Coagulation Comments:  hold for 1 day on  05/08/2007  NEW REGIMEN & LAB RESULTS Current INR: 2.4 Regimen: same  (no change)       Repeat testing in: 4 weeks MEDICATIONS ASPIR-81 81 MG TBEC (ASPIRIN) Take 1 tablet by mouth once a day COLACE 100 MG CAPS (DOCUSATE SODIUM) Take 1 capsule by mouth twice a day WARFARIN SODIUM 5 MG TABS (WARFARIN SODIUM) 1 and 1/2   once daily or  as directed. FUROSEMIDE 40 MG  TABS (FUROSEMIDE) Take 1  tablet by mouth one time a day LEVOTHYROXINE SODIUM 125 MCG TABS (LEVOTHYROXINE SODIUM) Take 1 tablet by mouth once a day POTASSIUM CHLORIDE CRYS CR 20 MEQ TBCR (POTASSIUM CHLORIDE CRYS CR) once daily TOPROL XL 50 MG TB24 (METOPROLOL SUCCINATE) Take 1 tablet by mouth two times a day CALTRATE 600+D 600-400 MG-UNIT  TABS (CALCIUM CARBONATE-VITAMIN D) Take 1 tablet by mouth three times a day CENTRUM SILVER   TABS (MULTIPLE VITAMINS-MINERALS) once daily ACETAMINOPHEN 500 MG CAPS (ACETAMINOPHEN) 1 capsule by mouth as needed TYLENOL PM EXTRA STRENGTH 500-25 MG TABS  (DIPHENHYDRAMINE-APAP (SLEEP)) 1 tablet by mouth at bedtime as needed EXCEDRIN EXTRA STRENGTH 250-250-65 MG TABS (ASPIRIN-ACETAMINOPHEN-CAFFEINE) take as needed   Anticoagulation Visit Questionnaire      Coumadin dose missed/changed:  No      Abnormal Bleeding Symptoms:  No   Any diet changes including alcohol intake, vegetables or greens since the last visit:  No Any illnesses or hospitalizations since the last visit:  No Any signs of clotting since the last visit (including chest discomfort, dizziness, shortness of breath, arm tingling, slurred speech, swelling or redness in leg):  No

## 2010-06-26 NOTE — Assessment & Plan Note (Signed)
Summary: pt/njr   Nurse Visit   Allergies: No Known Drug Allergies Laboratory Results   Blood Tests   Date/Time Received: February 01, 2010 11:48 AM  Date/Time Reported: February 01, 2010 11:48 AM    INR: 3.4   (Normal Range: 0.88-1.12   Therap INR: 2.0-3.5) Comments: Wynona Canes, CMA  February 01, 2010 11:48 AM     Orders Added: 1)  Est. Patient Level I [99211] 2)  Protime [[id number]]  Laboratory Results   Blood Tests      INR: 3.4   (Normal Range: 0.88-1.12   Therap INR: 2.0-3.5) Comments: Wynona Canes, CMA  February 01, 2010 11:48 AM       ANTICOAGULATION RECORD PREVIOUS REGIMEN & LAB RESULTS Anticoagulation Diagnosis:  Cardiac valve eplacement (mechanical) on  07/28/2007 Previous INR Goal Range:  2.5-3.5 on  10/26/2009 Previous INR:  2.9 on  01/03/2010 Previous Coumadin Dose(mg):  7.5mg  on tue,thu 5mg  on other days on  09/28/2009 Previous Regimen:  same on  01/03/2010 Previous Coagulation Comments:  hold for 1 day on  05/08/2007  NEW REGIMEN & LAB RESULTS Current INR: 3.4 Current Coumadin Dose(mg): 7.5mg  on mon,thurs, 5mg  on other days Regimen: same  (no change)       Repeat testing in: 4 weeks MEDICATIONS ASPIR-81 81 MG TBEC (ASPIRIN) Take 1 tablet by mouth once a day COLACE 100 MG CAPS (DOCUSATE SODIUM) Take 1 capsule by mouth twice a day WARFARIN SODIUM 5 MG TABS (WARFARIN SODIUM) Take 1 tablet by mouth once daily as directed FUROSEMIDE 40 MG  TABS (FUROSEMIDE) Take 1  tablet by mouth one time a day LEVOTHYROXINE SODIUM 125 MCG TABS (LEVOTHYROXINE SODIUM) Take 1 tablet by mouth once a day POTASSIUM CHLORIDE CRYS CR 20 MEQ TBCR (POTASSIUM CHLORIDE CRYS CR) once daily TOPROL XL 50 MG TB24 (METOPROLOL SUCCINATE) Take 1 tablet by mouth two times a day CALTRATE 600+D 600-400 MG-UNIT  TABS (CALCIUM CARBONATE-VITAMIN D) Take 1 tablet by mouth three times a day CENTRUM SILVER   TABS (MULTIPLE VITAMINS-MINERALS) once daily ACETAMINOPHEN 500 MG  CAPS (ACETAMINOPHEN) 1 capsule by mouth as needed TYLENOL PM EXTRA STRENGTH 500-25 MG TABS (DIPHENHYDRAMINE-APAP (SLEEP)) 1 tablet by mouth at bedtime as needed EXCEDRIN EXTRA STRENGTH 250-250-65 MG TABS (ASPIRIN-ACETAMINOPHEN-CAFFEINE) take as needed LOSARTAN POTASSIUM 50 MG TABS (LOSARTAN POTASSIUM) One tablet daily in PM   Anticoagulation Visit Questionnaire      Coumadin dose missed/changed:  No      Abnormal Bleeding Symptoms:  No   Any diet changes including alcohol intake, vegetables or greens since the last visit:  No Any illnesses or hospitalizations since the last visit:  No Any signs of clotting since the last visit (including chest discomfort, dizziness, shortness of breath, arm tingling, slurred speech, swelling or redness in leg):  No

## 2010-06-26 NOTE — Assessment & Plan Note (Signed)
Summary: pt labs--ccm          Flu Vaccine Consent Questions     Do you have a history of severe allergic reactions to this vaccine? no    Any prior history of allergic reactions to egg and/or gelatin? no    Do you have a sensitivity to the preservative Thimersol? no    Do you have a past history of Guillan-Barre Syndrome? no    Do you currently have an acute febrile illness? no    Have you ever had a severe reaction to latex? no    Vaccine information given and explained to patient? yes    Are you currently pregnant? no    Lot Number:[id number]   Exp Date:11/24/2010   Site Given  Left Deltoid IM  Nurse Visit   Allergies: No Known Drug Allergies Laboratory Results   Blood Tests      INR: 3.1   (Normal Range: 0.88-1.12   Therap INR: 2.0-3.5) Comments: Rita Ohara  March 01, 2010 9:53 AM     Orders Added: 1)  Est. Patient Level I [99211] 2)  Protime [[id number]] 3)  Admin 1st Vaccine [90471] 4)  Flu Vaccine 38yrs + [13244]   ANTICOAGULATION RECORD PREVIOUS REGIMEN & LAB RESULTS Anticoagulation Diagnosis:  Cardiac valve eplacement (mechanical) on  07/28/2007 Previous INR Goal Range:  2.5-3.5 on  10/26/2009 Previous INR:  3.4 on  02/01/2010 Previous Coumadin Dose(mg):  7.5mg  on mon,thurs, 5mg  on other days on  02/01/2010 Previous Regimen:  same on  01/03/2010 Previous Coagulation Comments:  hold for 1 day on  05/08/2007  NEW REGIMEN & LAB RESULTS Current INR: 3.1 Regimen: same  Repeat testing in: 4 weeks  Anticoagulation Visit Questionnaire Coumadin dose missed/changed:  No Abnormal Bleeding Symptoms:  No  Any diet changes including alcohol intake, vegetables or greens since the last visit:  No Any illnesses or hospitalizations since the last visit:  No Any signs of clotting since the last visit (including chest discomfort, dizziness, shortness of breath, arm tingling, slurred speech, swelling or redness in leg):  Yes  MEDICATIONS ASPIR-81 81 MG TBEC  (ASPIRIN) Take 1 tablet by mouth once a day COLACE 100 MG CAPS (DOCUSATE SODIUM) Take 1 capsule by mouth twice a day WARFARIN SODIUM 5 MG TABS (WARFARIN SODIUM) Take 1 tablet by mouth once daily as directed FUROSEMIDE 40 MG  TABS (FUROSEMIDE) Take 1  tablet by mouth one time a day LEVOTHYROXINE SODIUM 125 MCG TABS (LEVOTHYROXINE SODIUM) Take 1 tablet by mouth once a day POTASSIUM CHLORIDE CRYS CR 20 MEQ TBCR (POTASSIUM CHLORIDE CRYS CR) once daily TOPROL XL 50 MG TB24 (METOPROLOL SUCCINATE) Take 1 tablet by mouth two times a day CALTRATE 600+D 600-400 MG-UNIT  TABS (CALCIUM CARBONATE-VITAMIN D) Take 1 tablet by mouth three times a day CENTRUM SILVER   TABS (MULTIPLE VITAMINS-MINERALS) once daily ACETAMINOPHEN 500 MG CAPS (ACETAMINOPHEN) 1 capsule by mouth as needed TYLENOL PM EXTRA STRENGTH 500-25 MG TABS (DIPHENHYDRAMINE-APAP (SLEEP)) 1 tablet by mouth at bedtime as needed EXCEDRIN EXTRA STRENGTH 250-250-65 MG TABS (ASPIRIN-ACETAMINOPHEN-CAFFEINE) take as needed LOSARTAN POTASSIUM 50 MG TABS (LOSARTAN POTASSIUM) One tablet daily in PM

## 2010-06-26 NOTE — Assessment & Plan Note (Signed)
Summary: PT/NJR   Nurse Visit   Allergies: No Known Drug Allergies Laboratory Results   Blood Tests     PT: 18.5 s   (Normal Range: 10.6-13.4)  INR: 2.3   (Normal Range: 0.88-1.12   Therap INR: 2.0-3.5) Comments: Rita Ohara  June 06, 2009 10:16 AM     Orders Added: 1)  Est. Patient Level I [99211] 2)  Protime [[id number]]    ANTICOAGULATION RECORD PREVIOUS REGIMEN & LAB RESULTS Anticoagulation Diagnosis:  Cardiac valve eplacement (mechanical) on  07/28/2007 Previous INR Goal Range:  2-3 on  10/12/2007 Previous INR:  2.4 on  05/09/2009 Previous Coumadin Dose(mg):  5MG  QD on  10/21/2008 Previous Regimen:  same on  04/12/2009 Previous Coagulation Comments:  hold for 1 day on  05/08/2007  NEW REGIMEN & LAB RESULTS Current INR: 2.3 Regimen: same  Repeat testing in: 1 month  Anticoagulation Visit Questionnaire Coumadin dose missed/changed:  No Abnormal Bleeding Symptoms:  No  Any diet changes including alcohol intake, vegetables or greens since the last visit:  No Any illnesses or hospitalizations since the last visit:  No Any signs of clotting since the last visit (including chest discomfort, dizziness, shortness of breath, arm tingling, slurred speech, swelling or redness in leg):  No  MEDICATIONS ASPIR-81 81 MG TBEC (ASPIRIN) Take 1 tablet by mouth once a day COLACE 100 MG CAPS (DOCUSATE SODIUM) Take 1 capsule by mouth twice a day WARFARIN SODIUM 5 MG TABS (WARFARIN SODIUM) 1 and 1/2   once daily or  as directed. FUROSEMIDE 40 MG  TABS (FUROSEMIDE) Take 1  tablet by mouth one time a day LEVOXYL 125 MCG  TABS (LEVOTHYROXINE SODIUM) Take 1 tablet by mouth once a day POTASSIUM CHLORIDE CRYS CR 20 MEQ TBCR (POTASSIUM CHLORIDE CRYS CR) once daily TOPROL XL 50 MG TB24 (METOPROLOL SUCCINATE) 1/2 two times a day CALTRATE 600+D 600-400 MG-UNIT  TABS (CALCIUM CARBONATE-VITAMIN D) Take 1 tablet by mouth three times a day CENTRUM SILVER   TABS (MULTIPLE  VITAMINS-MINERALS) once daily ALDACTONE 50 MG  TABS (SPIRONOLACTONE) Take 1 tablet bid ULTRAM 50 MG TABS (TRAMADOL HCL) prn

## 2010-06-26 NOTE — Procedures (Signed)
Summary: Upper Endoscopy/[facility]  Upper Endoscopy/[facility]   Imported By: Sherian Rein 08/16/2009 15:06:41  _____________________________________________________________________  External Attachment:    Type:   Image     Comment:   External Document

## 2010-06-26 NOTE — Progress Notes (Signed)
Summary: Pt is having reactions to the Losartan Med  Phone Note Call from Patient Call back at Home Phone [phone]   Caller: Patient Summary of Call: Pt called and said that she started Losartan 50mg  on Tues 9/6/11and has taken it every night at 6pm. Pt says now she is in pain below sternum on top of stomach and pain radiates through to her mid back. Pt says she feels like she has been punched. Pt took tums, because she said that it feels like indegestion. Pt is also having heart flutters. Pt is wondering if she should stop taking the Losartan or what she should do?   Initial call taken by: Lucy Antigua,  February 02, 2010 8:08 AM  Follow-up for Phone Call        Would you like to try an alternate med or bring patient into the office. Please advise.  Follow-up by: Lucious Groves CMA,  February 02, 2010 8:32 AM  Additional Follow-up for Phone Call Additional follow up Details #1::        stop losartan for 3 days---have her call tuesday with update Additional Follow-up by: Birdie Sons MD,  February 02, 2010 11:45 AM    Additional Follow-up for Phone Call Additional follow up Details #2::    Patient notified and expressed understanding. Follow-up by: Lucious Groves CMA,  February 02, 2010 1:21 PM

## 2010-06-26 NOTE — Progress Notes (Signed)
Summary: triage   Phone Note Call from Patient Call back at Home Phone [phone]   Caller: Patient Call For: Dr. Juanda Chance Reason for Call: Talk to Nurse Summary of Call: received referral from Dr. Birdie Sons for pt to sch screening COL, overdue since 2009... pt ready to sch... pt on Coumadin... no NP3 appts available with Dr. Juanda Chance Initial call taken by: Vallarie Mare,  August 07, 2009 4:10 PM  Follow-up for Phone Call        Pt. will see Dr.Titianna Loomis on 08-22-09 at 2:30pm. Pt. instructed to call back as needed.  Follow-up by: Laureen Ochs LPN,  August 07, 2009 4:19 PM

## 2010-06-26 NOTE — Assessment & Plan Note (Signed)
Summary: PT//SLM   Nurse Visit   Allergies: No Known Drug Allergies Laboratory Results   Blood Tests     PT: 20.2 s   (Normal Range: 10.6-13.4)  INR: 2.8   (Normal Range: 0.88-1.12   Therap INR: 2.0-3.5) Comments: Rita Ohara  July 06, 2009 10:00 AM     Orders Added: 1)  Est. Patient Level I [99211] 2)  Protime [[id number]]   ANTICOAGULATION RECORD PREVIOUS REGIMEN & LAB RESULTS Anticoagulation Diagnosis:  Cardiac valve eplacement (mechanical) on  07/28/2007 Previous INR Goal Range:  2-3 on  10/12/2007 Previous INR:  2.3 on  06/06/2009 Previous Coumadin Dose(mg):  5MG  QD on  10/21/2008 Previous Regimen:  same on  06/06/2009 Previous Coagulation Comments:  hold for 1 day on  05/08/2007  NEW REGIMEN & LAB RESULTS Current INR: 2.8 Regimen: same  Repeat testing in: 1 month  Anticoagulation Visit Questionnaire Coumadin dose missed/changed:  No Abnormal Bleeding Symptoms:  No  Any diet changes including alcohol intake, vegetables or greens since the last visit:  No Any illnesses or hospitalizations since the last visit:  No Any signs of clotting since the last visit (including chest discomfort, dizziness, shortness of breath, arm tingling, slurred speech, swelling or redness in leg):  No  MEDICATIONS ASPIR-81 81 MG TBEC (ASPIRIN) Take 1 tablet by mouth once a day COLACE 100 MG CAPS (DOCUSATE SODIUM) Take 1 capsule by mouth twice a day WARFARIN SODIUM 5 MG TABS (WARFARIN SODIUM) 1 and 1/2   once daily or  as directed. FUROSEMIDE 40 MG  TABS (FUROSEMIDE) Take 1  tablet by mouth one time a day LEVOXYL 125 MCG  TABS (LEVOTHYROXINE SODIUM) Take 1 tablet by mouth once a day POTASSIUM CHLORIDE CRYS CR 20 MEQ TBCR (POTASSIUM CHLORIDE CRYS CR) once daily TOPROL XL 50 MG TB24 (METOPROLOL SUCCINATE) 1/2 two times a day CALTRATE 600+D 600-400 MG-UNIT  TABS (CALCIUM CARBONATE-VITAMIN D) Take 1 tablet by mouth three times a day CENTRUM SILVER   TABS (MULTIPLE  VITAMINS-MINERALS) once daily ALDACTONE 50 MG  TABS (SPIRONOLACTONE) Take 1 tablet bid ULTRAM 50 MG TABS (TRAMADOL HCL) prn

## 2010-06-26 NOTE — Assessment & Plan Note (Signed)
Summary: pt/njr   Nurse Visit   Allergies: No Known Drug Allergies Laboratory Results   Blood Tests      INR: 3.8   (Normal Range: 0.88-1.12   Therap INR: 2.0-3.5) Comments: Rita Ohara  December 20, 2009 9:45 AM     Orders Added: 1)  Est. Patient Level I [99211] 2)  Protime [[id number]]   ANTICOAGULATION RECORD PREVIOUS REGIMEN & LAB RESULTS Anticoagulation Diagnosis:  Cardiac valve eplacement (mechanical) on  07/28/2007 Previous INR Goal Range:  2.5-3.5 on  10/26/2009 Previous INR:  2.4 on  11/22/2009 Previous Coumadin Dose(mg):  7.5mg  on tue,thu 5mg  on other days on  09/28/2009 Previous Regimen:  same on  08/31/2009 Previous Coagulation Comments:  hold for 1 day on  05/08/2007  NEW REGIMEN & LAB RESULTS Current INR: 3.8 Regimen: 5mg . QD  Repeat testing in: 2 weeks  Anticoagulation Visit Questionnaire Coumadin dose missed/changed:  No Abnormal Bleeding Symptoms:  No  Any diet changes including alcohol intake, vegetables or greens since the last visit:  No Any illnesses or hospitalizations since the last visit:  No Any signs of clotting since the last visit (including chest discomfort, dizziness, shortness of breath, arm tingling, slurred speech, swelling or redness in leg):  No  MEDICATIONS ASPIR-81 81 MG TBEC (ASPIRIN) Take 1 tablet by mouth once a day COLACE 100 MG CAPS (DOCUSATE SODIUM) Take 1 capsule by mouth twice a day WARFARIN SODIUM 5 MG TABS (WARFARIN SODIUM) 1 and 1/2   once daily or  as directed. FUROSEMIDE 40 MG  TABS (FUROSEMIDE) Take 1  tablet by mouth one time a day LEVOTHYROXINE SODIUM 125 MCG TABS (LEVOTHYROXINE SODIUM) Take 1 tablet by mouth once a day POTASSIUM CHLORIDE CRYS CR 20 MEQ TBCR (POTASSIUM CHLORIDE CRYS CR) once daily TOPROL XL 50 MG TB24 (METOPROLOL SUCCINATE) Take 1 tablet by mouth two times a day CALTRATE 600+D 600-400 MG-UNIT  TABS (CALCIUM CARBONATE-VITAMIN D) Take 1 tablet by mouth three times a day CENTRUM SILVER   TABS  (MULTIPLE VITAMINS-MINERALS) once daily ACETAMINOPHEN 500 MG CAPS (ACETAMINOPHEN) 1 capsule by mouth as needed TYLENOL PM EXTRA STRENGTH 500-25 MG TABS (DIPHENHYDRAMINE-APAP (SLEEP)) 1 tablet by mouth at bedtime as needed EXCEDRIN EXTRA STRENGTH 250-250-65 MG TABS (ASPIRIN-ACETAMINOPHEN-CAFFEINE) take as needed

## 2010-06-26 NOTE — Assessment & Plan Note (Signed)
Summary: fu on meds/pt/pt will come in fasting/njr/pt rsc from bmp/cjr   Vital Signs:  Patient profile:   60 year old female Weight:      141 pounds Temp:     97.3 degrees F oral Pulse rate:   64 / minute Pulse rhythm:   regular Resp:     12 per minute BP sitting:   98 / 70  (left arm) Cuff size:   regular  Vitals Entered By: Gladis Riffle, RN (August 03, 2009 10:28 AM) CC: FU meds, fasting--c/o joint ache, cough, no energy since on lisinopril Is Patient Diabetic? No Comments due colonoscopy 9/09, but worried about coumadin with it   CC:  FU meds, fasting--c/o joint ache, cough, and no energy since on lisinopril.  History of Present Illness:  Follow-Up Visit      This is a 60 year old woman who presents for Follow-up visit.  The patient complains of palpitations, but denies chest pain.  Since the last visit the patient notes being seen by a specialist.  The patient reports taking meds as prescribed.  When questioned about possible medication side effects, the patient notes none.   reviewed notes from [facility] Palpitations: resolved on increased dose of metoprolol Cardiomyopathy: started lisinopril---has felt worse since then: cough, rhinorrhea, fatigue, muscle aches.   All other systems reviewed and were negative   Preventive Screening-Counseling & Management  Alcohol-Tobacco     Smoking Status: never  Current Medications (verified): 1)  Aspir-81 81 Mg Tbec (Aspirin) .... Take 1 Tablet By Mouth Once A Day 2)  Colace 100 Mg Caps (Docusate Sodium) .... Take 1 Capsule By Mouth Twice A Day 3)  Warfarin Sodium 5 Mg Tabs (Warfarin Sodium) .Marland Kitchen.. 1 and 1/2   Once Daily or  As Directed. 4)  Furosemide 40 Mg  Tabs (Furosemide) .... Take 1  Tablet By Mouth One Time A Day 5)  Levoxyl 125 Mcg  Tabs (Levothyroxine Sodium) .... Take 1 Tablet By Mouth Once A Day 6)  Potassium Chloride Crys Cr 20 Meq Tbcr (Potassium Chloride Crys Cr) .... Once Daily 7)  Toprol Xl 50 Mg Tb24 (Metoprolol Succinate)  .... Take 1 Tablet By Mouth Two Times A Day 8)  Caltrate 600+d 600-400 Mg-Unit  Tabs (Calcium Carbonate-Vitamin D) .... Take 1 Tablet By Mouth Three Times A Day 9)  Centrum Silver   Tabs (Multiple Vitamins-Minerals) .... Once Daily 10)  Aldactone 50 Mg  Tabs (Spironolactone) .... Take 1 Tablet Bid 11)  Ultram 50 Mg Tabs (Tramadol Hcl) .... Prn 12)  Lisinopril 5 Mg Tabs (Lisinopril) .... One Tablet Every Evening  Allergies (verified): No Known Drug Allergies  Past History:  Past Surgical History: Last updated: 04/27/2007 Aortic Valve Replacement: -06/13/06 Mastectomy L--breast tissue removed R Mitral valve replacement--06/13/06 Splenectomy--exploratory lap thoracotomy radiation valvulopathy--06/13/06 Pericardial stripping --01/2007  Family History: Last updated: 07/02/07 father-deceased gun shot wound mother alive and well  Social History: Last updated: 04/27/2007 Occupation:disability from teaching Married Never Smoked Regular exercise-no  Risk Factors: Exercise: no (04/27/2007)  Risk Factors: Smoking Status: never (08/03/2009)  Past Medical History: Anemia Breast cancer, hx of--S/P chemo X2 Hypothyroidism GI bleed (NSAIDS) endometrial ablation migraines Hodgkins disease--chemotherapy and RO RT WPW  (CV eval 04/00-mitral regurge--SBE prophylaxis aortic insufficiency (radioactive Hyperlipidemia Congestive heart failure  Physical Exam  General:  Well-developed,well-nourished,in no acute distress; alert,appropriate and cooperative throughout examination Head:  normocephalic and atraumatic.   Eyes:  pupils equal and pupils round.   Ears:  R ear normal and L ear  normal.   Neck:  No deformities, masses, or tenderness noted.no JVD.   Lungs:  normal respiratory effort and normal breath sounds.   Heart:  normal rate and regular rhythm.   Abdomen:  Bowel sounds positive,abdomen soft and non-tender without masses, organomegaly or hernias noted. Msk:  No deformity  or scoliosis noted of thoracic or lumbar spine.   Pulses:  R radial normal and L radial normal.   Neurologic:  cranial nerves II-XII intact and gait normal.     Impression & Recommendations:  Problem # 1:  DYSPNEA/SHORTNESS OF BREATH (ICD-786.09)  chronic but stable Her updated medication list for this problem includes:    Furosemide 40 Mg Tabs (Furosemide) .Marland Kitchen... Take 1  tablet by mouth one time a day    Toprol Xl 50 Mg Tb24 (Metoprolol succinate) .Marland Kitchen... Take 1 tablet by mouth two times a day    Aldactone 50 Mg Tabs (Spironolactone) .Marland Kitchen... Take 1 tablet bid  Orders: TLB-CBC Platelet - w/Differential (85025-CBCD)  Problem # 2:  HYPOTHYROIDISM (ICD-244.9) tolerating meds Her updated medication list for this problem includes:    Levothyroxine Sodium 125 Mcg Tabs (Levothyroxine sodium) .Marland Kitchen... Take 1 tablet by mouth once a day  Orders: Venipuncture (04540) TLB-TSH (Thyroid Stimulating Hormone) (84443-TSH)  Problem # 3:  CONGESTIVE HEART FAILURE (ICD-428.0)  clinically stable sounds as if she is not tolerating lisinopril as well as possible.  Her updated medication list for this problem includes:    Aspir-81 81 Mg Tbec (Aspirin) .Marland Kitchen... Take 1 tablet by mouth once a day    Warfarin Sodium 5 Mg Tabs (Warfarin sodium) .Marland Kitchen... 1 and 1/2   once daily or  as directed.    Furosemide 40 Mg Tabs (Furosemide) .Marland Kitchen... Take 1  tablet by mouth one time a day    Toprol Xl 50 Mg Tb24 (Metoprolol succinate) .Marland Kitchen... Take 1 tablet by mouth two times a day    Aldactone 50 Mg Tabs (Spironolactone) .Marland Kitchen... Take 1 tablet bid    Losartan Potassium 50 Mg Tabs (Losartan potassium) .Marland Kitchen... 1 by mouth once daily or as directed  Orders: TLB-BMP (Basic Metabolic Panel-BMET) (80048-METABOL)  Complete Medication List: 1)  Aspir-81 81 Mg Tbec (Aspirin) .... Take 1 tablet by mouth once a day 2)  Colace 100 Mg Caps (Docusate sodium) .... Take 1 capsule by mouth twice a day 3)  Warfarin Sodium 5 Mg Tabs (Warfarin sodium) .Marland Kitchen..  1 and 1/2   once daily or  as directed. 4)  Furosemide 40 Mg Tabs (Furosemide) .... Take 1  tablet by mouth one time a day 5)  Levothyroxine Sodium 125 Mcg Tabs (Levothyroxine sodium) .... Take 1 tablet by mouth once a day 6)  Potassium Chloride Crys Cr 20 Meq Tbcr (Potassium chloride crys cr) .... Once daily 7)  Toprol Xl 50 Mg Tb24 (Metoprolol succinate) .... Take 1 tablet by mouth two times a day 8)  Caltrate 600+d 600-400 Mg-unit Tabs (Calcium carbonate-vitamin d) .... Take 1 tablet by mouth three times a day 9)  Centrum Silver Tabs (Multiple vitamins-minerals) .... Once daily 10)  Aldactone 50 Mg Tabs (Spironolactone) .... Take 1 tablet bid 11)  Ultram 50 Mg Tabs (Tramadol hcl) .... Prn 12)  Losartan Potassium 50 Mg Tabs (Losartan potassium) .Marland Kitchen.. 1 by mouth once daily or as directed  Other Orders: TLB-Hepatic/Liver Function Pnl (80076-HEPATIC) TLB-Lipid Panel (80061-LIPID) Protime (98119JY) Gastroenterology Referral (GI)  Patient Instructions: 1)  Please schedule a follow-up appointment in 6 months. Prescriptions: WARFARIN SODIUM 5 MG TABS (WARFARIN  SODIUM) 1 and 1/2   once daily or  as directed.  #135 x 3   Entered and Authorized by:   Birdie Sons MD   Signed by:   Birdie Sons MD on 08/03/2009   Method used:   Electronically to        [facility]* (mail-order)             ,          Ph: [phone]       Fax: [phone]   RxID:   [id number] POTASSIUM CHLORIDE CRYS CR 20 MEQ TBCR (POTASSIUM CHLORIDE CRYS CR) once daily  #90 x 3   Entered and Authorized by:   Birdie Sons MD   Signed by:   Birdie Sons MD on 08/03/2009   Method used:   Electronically to        [facility]* (mail-order)             ,          Ph: [phone]       Fax: [phone]   RxID:   [id number] LEVOTHYROXINE SODIUM 125 MCG TABS (LEVOTHYROXINE SODIUM) Take 1 tablet by mouth once a day  #90 x 3   Entered and Authorized by:   Birdie Sons MD   Signed by:   Birdie Sons MD on  08/03/2009   Method used:   Electronically to        [facility]* (mail-order)             ,          Ph: [phone]       Fax: [phone]   RxID:   [id number] FUROSEMIDE 40 MG  TABS (FUROSEMIDE) Take 1  tablet by mouth one time a day  #90 x 3   Entered and Authorized by:   Birdie Sons MD   Signed by:   Birdie Sons MD on 08/03/2009   Method used:   Electronically to        [facility]* (mail-order)             ,          Ph: [phone]       Fax: [phone]   RxID:   [id number] LOSARTAN POTASSIUM 50 MG TABS (LOSARTAN POTASSIUM) 1 by mouth once daily or as directed  #90 x 3   Entered and Authorized by:   Birdie Sons MD   Signed by:   Birdie Sons MD on 08/03/2009   Method used:   Electronically to        [facility]* (mail-order)             ,          Ph: [phone]       Fax: [phone]   RxID:   [id number]   Appended Document: fu on meds/pt/pt will come in fasting/njr/pt rsc from bmp/cjr   ANTICOAGULATION RECORD PREVIOUS REGIMEN & LAB RESULTS Anticoagulation Diagnosis:  Cardiac valve eplacement (mechanical) on  07/28/2007 Previous INR Goal Range:  2-3 on  10/12/2007 Previous INR:  2.8 on  07/06/2009 Previous Coumadin Dose(mg):  5MG  QD on  10/21/2008 Previous Regimen:  same on  07/06/2009 Previous Coagulation Comments:  hold for 1 day on  05/08/2007  NEW REGIMEN & LAB RESULTS Current INR: 2.9 Regimen: same  Repeat testing in: 1 month  Anticoagulation Visit Questionnaire Coumadin dose missed/changed:  No Abnormal Bleeding Symptoms:  No  Any diet changes including alcohol  intake, vegetables or greens since the last visit:  No Any illnesses or hospitalizations since the last visit:  No Any signs of clotting since the last visit (including chest discomfort, dizziness, shortness of breath, arm tingling, slurred speech, swelling or redness in leg):  No  MEDICATIONS ASPIR-81 81 MG TBEC (ASPIRIN) Take 1 tablet by mouth once a  day COLACE 100 MG CAPS (DOCUSATE SODIUM) Take 1 capsule by mouth twice a day WARFARIN SODIUM 5 MG TABS (WARFARIN SODIUM) 1 and 1/2   once daily or  as directed. FUROSEMIDE 40 MG  TABS (FUROSEMIDE) Take 1  tablet by mouth one time a day LEVOTHYROXINE SODIUM 125 MCG TABS (LEVOTHYROXINE SODIUM) Take 1 tablet by mouth once a day POTASSIUM CHLORIDE CRYS CR 20 MEQ TBCR (POTASSIUM CHLORIDE CRYS CR) once daily TOPROL XL 50 MG TB24 (METOPROLOL SUCCINATE) Take 1 tablet by mouth two times a day CALTRATE 600+D 600-400 MG-UNIT  TABS (CALCIUM CARBONATE-VITAMIN D) Take 1 tablet by mouth three times a day CENTRUM SILVER   TABS (MULTIPLE VITAMINS-MINERALS) once daily ALDACTONE 50 MG  TABS (SPIRONOLACTONE) Take 1 tablet bid ULTRAM 50 MG TABS (TRAMADOL HCL) prn LOSARTAN POTASSIUM 50 MG TABS (LOSARTAN POTASSIUM) 1 by mouth once daily or as directed    Laboratory Results   Blood Tests     PT: 20.5 s   (Normal Range: 10.6-13.4)  INR: 2.9   (Normal Range: 0.88-1.12   Therap INR: 2.0-3.5) Comments: Rita Ohara  August 03, 2009 11:11 AM

## 2010-06-28 NOTE — Assessment & Plan Note (Signed)
Summary: pt/njr   Nurse Visit   Allergies: No Known Drug Allergies Laboratory Results   Blood Tests      INR: 3.5   (Normal Range: 0.88-1.12   Therap INR: 2.0-3.5) Comments: Rita Ohara  June 01, 2010 2:59 PM     Orders Added: 1)  Est. Patient Level I [99211] 2)  Protime [29562ZH]   ANTICOAGULATION RECORD PREVIOUS REGIMEN & LAB RESULTS Anticoagulation Diagnosis:  Cardiac valve eplacement (mechanical) on  07/28/2007 Previous INR Goal Range:  2.5-3.5 on  10/26/2009 Previous INR:  2.9 on  04/26/2010 Previous Coumadin Dose(mg):  7.5mg  on mon,thurs, 5mg  on other days on  02/01/2010 Previous Regimen:  same on  04/26/2010 Previous Coagulation Comments:  hold for 1 day on  05/08/2007  NEW REGIMEN & LAB RESULTS Current INR: 3.5 Regimen: 5mg . QD  Repeat testing in: 2 weeks  Anticoagulation Visit Questionnaire Coumadin dose missed/changed:  No Abnormal Bleeding Symptoms:  No  Any diet changes including alcohol intake, vegetables or greens since the last visit:  No Any illnesses or hospitalizations since the last visit:  No Any signs of clotting since the last visit (including chest discomfort, dizziness, shortness of breath, arm tingling, slurred speech, swelling or redness in leg):  No  MEDICATIONS ASPIR-81 81 MG TBEC (ASPIRIN) Take 1 tablet by mouth once a day COLACE 100 MG CAPS (DOCUSATE SODIUM) Take 1 capsule by mouth twice a day WARFARIN SODIUM 5 MG TABS (WARFARIN SODIUM) Take 1 tablet by mouth once daily as directed FUROSEMIDE 40 MG  TABS (FUROSEMIDE) Take 1  tablet by mouth one time a day LEVOTHYROXINE SODIUM 125 MCG TABS (LEVOTHYROXINE SODIUM) Take 1 tablet by mouth once a day POTASSIUM CHLORIDE CRYS CR 20 MEQ TBCR (POTASSIUM CHLORIDE CRYS CR) once daily TOPROL XL 50 MG TB24 (METOPROLOL SUCCINATE) Take 1 tablet by mouth two times a day CALTRATE 600+D 600-400 MG-UNIT  TABS (CALCIUM CARBONATE-VITAMIN D) Take 1 tablet by mouth three times a day CENTRUM SILVER    TABS (MULTIPLE VITAMINS-MINERALS) once daily ACETAMINOPHEN 500 MG CAPS (ACETAMINOPHEN) 1 capsule by mouth as needed TYLENOL PM EXTRA STRENGTH 500-25 MG TABS (DIPHENHYDRAMINE-APAP (SLEEP)) 1 tablet by mouth at bedtime as needed EXCEDRIN EXTRA STRENGTH 250-250-65 MG TABS (ASPIRIN-ACETAMINOPHEN-CAFFEINE) take as needed LOSARTAN POTASSIUM 50 MG TABS (LOSARTAN POTASSIUM) One tablet daily in PM

## 2010-06-28 NOTE — Assessment & Plan Note (Signed)
Summary: pt//ccm   Nurse Visit   Allergies: No Known Drug Allergies Laboratory Results   Blood Tests      INR: 3.3   (Normal Range: 0.88-1.12   Therap INR: 2.0-3.5) Comments: Rita Ohara  June 15, 2010 10:40 AM     Orders Added: 1)  Est. Patient Level I [99211] 2)  Protime [16109UE]   ANTICOAGULATION RECORD PREVIOUS REGIMEN & LAB RESULTS Anticoagulation Diagnosis:  Cardiac valve eplacement (mechanical) on  07/28/2007 Previous INR Goal Range:  2.5-3.5 on  10/26/2009 Previous INR:  3.5 on  06/01/2010 Previous Coumadin Dose(mg):  7.5mg  on mon,thurs, 5mg  on other days on  02/01/2010 Previous Regimen:  5mg . QD on  06/01/2010 Previous Coagulation Comments:  hold for 1 day on  05/08/2007  NEW REGIMEN & LAB RESULTS Current INR: 3.3 Regimen: same  Repeat testing in: 4 weeks  Anticoagulation Visit Questionnaire Coumadin dose missed/changed:  No Abnormal Bleeding Symptoms:  No  Any diet changes including alcohol intake, vegetables or greens since the last visit:  No Any illnesses or hospitalizations since the last visit:  No Any signs of clotting since the last visit (including chest discomfort, dizziness, shortness of breath, arm tingling, slurred speech, swelling or redness in leg):  No  MEDICATIONS ASPIR-81 81 MG TBEC (ASPIRIN) Take 1 tablet by mouth once a day COLACE 100 MG CAPS (DOCUSATE SODIUM) Take 1 capsule by mouth twice a day WARFARIN SODIUM 5 MG TABS (WARFARIN SODIUM) Take 1 tablet by mouth once daily as directed FUROSEMIDE 40 MG  TABS (FUROSEMIDE) Take 1  tablet by mouth one time a day LEVOTHYROXINE SODIUM 125 MCG TABS (LEVOTHYROXINE SODIUM) Take 1 tablet by mouth once a day POTASSIUM CHLORIDE CRYS CR 20 MEQ TBCR (POTASSIUM CHLORIDE CRYS CR) once daily TOPROL XL 50 MG TB24 (METOPROLOL SUCCINATE) Take 1 tablet by mouth two times a day CALTRATE 600+D 600-400 MG-UNIT  TABS (CALCIUM CARBONATE-VITAMIN D) Take 1 tablet by mouth three times a day CENTRUM  SILVER   TABS (MULTIPLE VITAMINS-MINERALS) once daily ACETAMINOPHEN 500 MG CAPS (ACETAMINOPHEN) 1 capsule by mouth as needed TYLENOL PM EXTRA STRENGTH 500-25 MG TABS (DIPHENHYDRAMINE-APAP (SLEEP)) 1 tablet by mouth at bedtime as needed EXCEDRIN EXTRA STRENGTH 250-250-65 MG TABS (ASPIRIN-ACETAMINOPHEN-CAFFEINE) take as needed LOSARTAN POTASSIUM 50 MG TABS (LOSARTAN POTASSIUM) One tablet daily in PM

## 2010-06-28 NOTE — Assessment & Plan Note (Signed)
Summary: pt/njr   Nurse Visit   Allergies: No Known Drug Allergies Laboratory Results   Blood Tests      INR: 2.9   (Normal Range: 0.88-1.12   Therap INR: 2.0-3.5) Comments: Rita Ohara  April 26, 2010 10:44 AM     Orders Added: 1)  Est. Patient Level I [99211] 2)  Protime [04540JW]   ANTICOAGULATION RECORD PREVIOUS REGIMEN & LAB RESULTS Anticoagulation Diagnosis:  Cardiac valve eplacement (mechanical) on  07/28/2007 Previous INR Goal Range:  2.5-3.5 on  10/26/2009 Previous INR:  3.9 on  04/05/2010 Previous Coumadin Dose(mg):  7.5mg  on mon,thurs, 5mg  on other days on  02/01/2010 Previous Regimen:  hold 2 days then resume  on  04/05/2010 Previous Coagulation Comments:  hold for 1 day on  05/08/2007  NEW REGIMEN & LAB RESULTS Current INR: 2.9 Regimen: same  Repeat testing in: 4 weeks  Anticoagulation Visit Questionnaire Coumadin dose missed/changed:  No Abnormal Bleeding Symptoms:  No  Any diet changes including alcohol intake, vegetables or greens since the last visit:  No Any illnesses or hospitalizations since the last visit:  No Any signs of clotting since the last visit (including chest discomfort, dizziness, shortness of breath, arm tingling, slurred speech, swelling or redness in leg):  No  MEDICATIONS ASPIR-81 81 MG TBEC (ASPIRIN) Take 1 tablet by mouth once a day COLACE 100 MG CAPS (DOCUSATE SODIUM) Take 1 capsule by mouth twice a day WARFARIN SODIUM 5 MG TABS (WARFARIN SODIUM) Take 1 tablet by mouth once daily as directed FUROSEMIDE 40 MG  TABS (FUROSEMIDE) Take 1  tablet by mouth one time a day LEVOTHYROXINE SODIUM 125 MCG TABS (LEVOTHYROXINE SODIUM) Take 1 tablet by mouth once a day POTASSIUM CHLORIDE CRYS CR 20 MEQ TBCR (POTASSIUM CHLORIDE CRYS CR) once daily TOPROL XL 50 MG TB24 (METOPROLOL SUCCINATE) Take 1 tablet by mouth two times a day CALTRATE 600+D 600-400 MG-UNIT  TABS (CALCIUM CARBONATE-VITAMIN D) Take 1 tablet by mouth three times a  day CENTRUM SILVER   TABS (MULTIPLE VITAMINS-MINERALS) once daily ACETAMINOPHEN 500 MG CAPS (ACETAMINOPHEN) 1 capsule by mouth as needed TYLENOL PM EXTRA STRENGTH 500-25 MG TABS (DIPHENHYDRAMINE-APAP (SLEEP)) 1 tablet by mouth at bedtime as needed EXCEDRIN EXTRA STRENGTH 250-250-65 MG TABS (ASPIRIN-ACETAMINOPHEN-CAFFEINE) take as needed LOSARTAN POTASSIUM 50 MG TABS (LOSARTAN POTASSIUM) One tablet daily in PM

## 2010-07-13 ENCOUNTER — Ambulatory Visit (INDEPENDENT_AMBULATORY_CARE_PROVIDER_SITE_OTHER): Payer: BC Managed Care – PPO | Admitting: Internal Medicine

## 2010-07-13 ENCOUNTER — Other Ambulatory Visit: Payer: Self-pay

## 2010-07-13 DIAGNOSIS — Z954 Presence of other heart-valve replacement: Secondary | ICD-10-CM

## 2010-07-13 DIAGNOSIS — Z952 Presence of prosthetic heart valve: Secondary | ICD-10-CM

## 2010-07-13 LAB — POCT INR: INR: 4.2

## 2010-07-26 ENCOUNTER — Ambulatory Visit (INDEPENDENT_AMBULATORY_CARE_PROVIDER_SITE_OTHER): Payer: BC Managed Care – PPO | Admitting: Internal Medicine

## 2010-07-26 DIAGNOSIS — Z952 Presence of prosthetic heart valve: Secondary | ICD-10-CM

## 2010-07-26 DIAGNOSIS — Z954 Presence of other heart-valve replacement: Secondary | ICD-10-CM

## 2010-07-26 LAB — POCT INR: INR: 1.9

## 2010-08-09 ENCOUNTER — Ambulatory Visit (INDEPENDENT_AMBULATORY_CARE_PROVIDER_SITE_OTHER): Payer: BC Managed Care – PPO

## 2010-08-09 DIAGNOSIS — Z952 Presence of prosthetic heart valve: Secondary | ICD-10-CM

## 2010-08-09 DIAGNOSIS — Z954 Presence of other heart-valve replacement: Secondary | ICD-10-CM

## 2010-08-09 LAB — POCT INR: INR: 3

## 2010-08-13 ENCOUNTER — Other Ambulatory Visit: Payer: Self-pay | Admitting: Internal Medicine

## 2010-08-29 ENCOUNTER — Other Ambulatory Visit: Payer: Self-pay | Admitting: Internal Medicine

## 2010-09-06 ENCOUNTER — Ambulatory Visit (INDEPENDENT_AMBULATORY_CARE_PROVIDER_SITE_OTHER): Payer: BC Managed Care – PPO | Admitting: Internal Medicine

## 2010-09-06 DIAGNOSIS — Z952 Presence of prosthetic heart valve: Secondary | ICD-10-CM

## 2010-09-06 DIAGNOSIS — Z954 Presence of other heart-valve replacement: Secondary | ICD-10-CM

## 2010-09-06 LAB — POCT INR: INR: 2.4

## 2010-09-06 NOTE — Patient Instructions (Signed)
Take 7.5mg . Today only then resume normal dose.

## 2010-10-04 ENCOUNTER — Ambulatory Visit: Payer: BC Managed Care – PPO | Admitting: Internal Medicine

## 2010-10-04 DIAGNOSIS — Z954 Presence of other heart-valve replacement: Secondary | ICD-10-CM

## 2010-10-04 DIAGNOSIS — Z7901 Long term (current) use of anticoagulants: Secondary | ICD-10-CM

## 2010-10-04 DIAGNOSIS — Z952 Presence of prosthetic heart valve: Secondary | ICD-10-CM

## 2010-10-04 LAB — POCT INR: INR: 2.5

## 2010-10-04 NOTE — Patient Instructions (Signed)
Same dose, 2.5 mg only on fridays then 5 mg on other days 

## 2010-10-09 NOTE — H&P (Signed)
NAME:  Grace [name], Grace [name]              ACCOUNT NO.:  [id number]   MEDICAL RECORD NO.:  [id number]          PATIENT TYPE:  INP   LOCATION:  3028                         FACILITY:  [facility]   PHYSICIAN:  Casimiro Needle L. Reynolds, M.D.DATE OF BIRTH:  1950-08-13   DATE OF ADMISSION:  03/09/2007  DATE OF DISCHARGE:                              HISTORY & PHYSICAL   CHIEF COMPLAINT:  Unsteady gait.   HISTORY OF PRESENT ILLNESS:  This is one of several [facility] admissions and the first Neurology Service admission for this 60-  year-old woman with a past medical history which includes mitral and  aortic valve replacement in January of this year.  She went to the  Coumadin clinic today to have her INR checked.  When she was leaving,  she developed an acute onset of unsteadiness of gait with a tendency to  go to the right, which she says she could not control.  She denied any  associated focal weakness or vertigo.  She believes she was unable to  speak, and as she was walking in the parking lot she tried to call out  to someone driving in a car and no words came.  Eventually, she sat down  and was attended to by her mother and other people in the parking lot,  and she was brought back inside the Coumadin clinic where she was  examined and advised to go to the emergency room for evaluation and  probable admission.  She states that subsequently she has been walking  around and been feeling much better.  She has never felt anything quite  like this before.   PAST MEDICAL HISTORY:  She has a history of mitral and aortic valve  replacements done at [facility] in January of this year.  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  She also had a history of breast cancer  1992, status post surgery and chemotherapy.  She has coronary artery  disease and known left main disease.  She does not have any history of  hypertension or diabetes.   MEDICATIONS:  1. Coumadin.  2. Levothyroxine 100 mg daily.  3. Lasix 80 mg daily.  4. KCl 20 mEq b.i.d.  5. Toprol-XL 25 mg daily.  6. Prozac 20 mg daily.   ALLERGIES:  No known drug allergies.   FAMILY HISTORY:  Remarkable for coronary artery disease.   SOCIAL HISTORY:  She is independent in her activities of daily living.  There is no history of tobacco, alcohol or illicit drug use.   REVIEW OF SYSTEMS:  Neurologic as above.  She denies any chest pain,  palpitations, shortness of breath.  Otherwise, a full 10-system review  of systems is negative except for that outlined in the HPI and the  accompanying admission nursing records.   PHYSICAL EXAMINATION:  VITAL SIGNS:  Temperature 97.1, blood pressure  128/58, pulse 84, respirations 16, O2 sat 97% on room air.  GENERAL:  This is a healthy-appearing woman seen in no distress.  HEENT:  Head:  Cranium is normocephalic and atraumatic.  Oropharynx  benign.  NECK:  Supple without carotid or supraclavicular bruits.  HEART:  Regular rate and rhythm with valvular clicks, but no murmurs.  CHEST:  Clear to auscultation bilaterally.  ABDOMEN:  Soft.  Normoactive bowel sounds.  EXTREMITIES:  No edema, 2+ pulses.  NEUROLOGIC EXAMINATION:  Mental status:  She is awake and alert.  She is  oriented to time, place and person.  Recent and remote memory are  intact.  Speech is fluent and not dysarthric.  She has no difficulty  naming objects or repeating phrases.  Cranial nerves:  Pupils equal and  reactive.  Extraocular movements are full without nystagmus.  Visual  fields are full to confrontation.  Hearing is intact to conversational  speech.  Face, tongue and palate moved normally and symmetrically.  Motor:  Normal bulk and tone.  Normal strength in all tested extremity  muscles.  Sensation intact to light touch and double simultaneous  stimulation in all extremities.  Coordination:  Finger-to-nose is  performed  accurately.  Heel-to-shin is performed accurately.  Rapid  movements are performed accurately.  Gait:  She arises easily from a  chair and her stance is normal.  She is able to heel-toe and tandem walk  without difficulty in the hallway.  Reflexes 2+ and symmetric.  Toes are  downgoing bilaterally.   LABORATORY STUDIES:  A chemistry is unremarkable except for an elevated  glucose of 137, BUN and creatinine of 14 and 1.3 respectively.  CBC is  pending.  INR is 2.1.  CT of the head is personally reviewed, and the  study is negative.   IMPRESSION:  Transient ischemic attack with transient gait unsteadiness,  most likely due to subtherapeutic anticoagulation for valvular heart  disease.   PLAN:  We will admit for a workup including MRI, MRA and echo.  We will  place her on heparin until her INR is back in the therapeutic range of  2.5 to 3.5.  The stroke service to follow.      Michael L. Thad Ranger, M.D.  Electronically Signed     MLR/MEDQ  D:  03/09/2007  T:  03/10/2007  Job:  [id number]   cc:   [name], MD  Bevelyn Buckles. Bensimhon, MD

## 2010-10-09 NOTE — Assessment & Plan Note (Signed)
[facility]                            CARDIOLOGY OFFICE NOTE   Grace [name], Grace [name]                     MRN:          [id number]  DATE:11/14/2006                            DOB:          [birth year]    PRIMARY CARE PHYSICIAN:  Dr. Birdie Sons.   INTERVAL HISTORY:  Grace [name] is a very pleasant, 60 year old woman with  multiple medical problems who was previously followed by Dr. Geralynn Rile. She has a history of Hodgkin's lymphoma for which she received  extensive mantle radiation. She also has a history of breast cancer and  essentially asymptomatic Wolff-Parkinson-White syndrome. In January  2006, she underwent replacement of both aortic and mitral valves by Dr.  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 Postoperatively her  course was complicated by pancreatitis and recurrent pleural effusions.   More recently she has had problems with profound exercise intolerance,  underwent a CPX test which showed a VO2 of 15.9 mL per kg per minute  with a slope of 43.1 consistent with a moderate circulatory limitation.  She was seen by Dr. Earma Reading and Dr. Regino Schultze at [facility] and underwent repeat  catheterization. This showed stable coronary artery disease with a 50%  left main stenosis which was not previously bypassed. There was also  evidence of equalization of diastolic pressures though the LV pressure  is not able to be measured directly due to her prosthetic valve. It was  thought that she likely had pericardial constriction and would need  pericardial stripping although they wanted to see if she would improve  any with adjustment in her Lasix dose. Her Lasix dose was doubled and  she returns today for followup.   She says that she has seen minimal if any change in her exercise  tolerance with increased Lasix. She still remains very short of breath  just going up a flight or two of steps. She has an occasional pain that  goes to her right upper chest  pain into her back. This is chronic. She  denies any lower extremity edema, no orthopnea or PND.   MEDICATIONS:  1. Centrum silver.  2. Coumadin.  3. Paxil 20 mg b.i.d.  4. Synthroid 100 mcg a day.  5. Potassium 20 b.i.d.  6. Aspirin 81 a day.  7. Colace 100 b.i.d.  8. Metoprolol 50 a day.  9. Lasix 40 b.i.d.   PHYSICAL EXAMINATION:  GENERAL:  She is a thin, somewhat frail-appearing  woman in no acute distress. She ambulates around the clinic without any  respiratory difficulty.  VITAL SIGNS:  Blood pressure is 108/70, heart rate is 104, weight is  136.  HEENT:  Normal.  NECK:  JVP is about 9 cmH2O with prominent CV waves. There is a positive  Kussmaul's sign. Carotids are 2+ bilaterally without any bruits. There  is no lymphadenopathy or thyromegaly.  CARDIAC:  She has a nondisplaced PMI. There is a regular rate and rhythm  with a mechanical S1, S2. Valve sounds are crisp, there is no murmur.  LUNGS:  Clear.  ABDOMEN:  Soft, nontender, nondistended, no hepatosplenomegaly, no  bruits,  no masses, good bowel sounds.  EXTREMITIES:  Warm with no clubbing, cyanosis or edema. No rash.  NEUROLOGIC:  Alert and oriented x3. Cranial nerves II-XII are intact.  Moves all 4 extremities without difficulty. Affect is pleasant.   ASSESSMENT/PLAN:  Persistent dyspnea. 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 I will check renal function today. If renal function is  okay, will attempt to increase her Lasix to 80 mg in the morning and 40  mg at night to see if this helps at all. I have asked her to follow back  up with doctors Earma Reading and Regino Schultze for further evaluation for possible  pericardial stripping.   DISPOSITION:  Will see her back for routine followup in 2 months if not  sooner.     Bevelyn Buckles. Bensimhon, MD  Electronically Signed    DRB/MedQ  DD: 11/14/2006  DT: 11/14/2006  Job #: [id number]
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
## 2010-10-09 NOTE — Assessment & Plan Note (Signed)
[facility]                            CARDIOLOGY OFFICE NOTE   Grace [name], Grace [name]                     MRN:          [id number]  DATE:10/15/2006                            DOB:          [birth year]    PRIMARY CARE PHYSICIAN:  Grace [name], M.D.   HISTORY OF PRESENT ILLNESS:  Grace [name] is a 60 year old lady status post  replacement of both aortic and mitral valves by Grace [name] in January  2006 using St. Jude valves.  Postoperatively, she suffered pancreatitis  and recurrent pleural effusions that required an extended postoperative  stay.   Since discharge, she has had persistent dyspnea with minimal exertion.  Extensive evaluation has been unrevealing.  Interestingly, her cardiac  index was only 1.6 to 1.7 at catheterization in mid March.  She had not  had any chest pain.   As I was unable to come up with a clear diagnosis for her persistent  debility, I sought a second opinion from Grace [name], whom she  had seen prior to her surgery.  He was also unable to come up with a  definitive answer.  She did have a cardiac functional MR there.  I do  not have the results from that.  Grace [name] ultimately recommended that  she have a CPX test which has been performed here and the results of  which are pending.   Her symptoms are stable since I saw her last a few weeks ago.   Her current medications are:  1. Multivitamin.  2. Coumadin.  3. Fluoxetine 20 mg twice daily.  4. Synthroid 100 mcg daily.  5. K-Dur 20 mEq twice daily.  6. Aspirin 81 mg daily.  7. Colace 100 mg twice daily.  8. Calcium 600 mg three times daily.  9. Toprol-XL 50 mg daily.  10.Lasix 40 mg daily.   PHYSICAL EXAMINATION:  GENERAL:  She is thin, fairly frail-appearing,  but in no distress.  VITAL SIGNS:  The heart rate 94, blood pressure 104/70 and weight of 134  pounds.  Weight is stable.  NECK:  She has no jugular venous distention, thyromegaly or  lymphadenopathy  LUNGS:  Clear to auscultation.  HEART:  She has a nondisplaced point of maximal cardiac impulse.  There  is a regular rate and rhythm with mechanical S1 and S2.  Valve sounds  are crisp.  No murmur.  ABDOMEN:  Soft, nondistended, nontender.  There is no  hepatosplenomegaly.  Bowel sounds are normal.  EXTREMITIES:  Warm without cyanosis, clubbing, edema, or ulceration.   Electrocardiogram demonstrates normal sinus rhythm at 94 beats per  minute and a left bundle-branch block.   IMPRESSION/RECOMMENDATIONS:  1. Status post aortic and mitral valve replacements.  2. Persistent dyspnea:  Await CPX results.  Will follow up functional      MR study.  3. Coronary disease:  A 50% left main stenosis prior to her surgery.      She was not bypassed.  Continue Coumadin.     Grace Farber, MD  Electronically Signed    WED/MedQ  DD: 10/16/2006  DT:  10/16/2006  Job #: [id number]   cc:   Grace Mole. Swords, MD

## 2010-10-09 NOTE — Assessment & Plan Note (Signed)
[facility]                            CARDIOLOGY OFFICE NOTE   Grace [name], Grace [name]                     MRN:          [id number]  DATE:09/22/2006                            DOB:          03-26-1951    Primary care physician:  [name], MD   HISTORY OF PRESENT ILLNESS:  Grace [name] is a 60 year old lady who is  status post replacement of both the aortic and mitral valves by Dr.  [name] in January 2008.  Postoperatively she suffered pancreatitis and  recurrent pleural effusions that required an extended postoperative  stay.   Since discharge, she has had progressive dyspnea with very minimal  exertion and has had a persistent sinus tachycardia.  She had a  prolonged hospitalization from March 5-17 for evaluation of this.  This  evaluation included assessment for pulmonary embolism and endocarditis,  which were normal.  Last week I took her to right heart catheterization.  It demonstrated a right atrial pressure of 9, a right ventricular  pressure of 34, PA pressures of 32/17, a wedge pressure of 12 with V-  waves only to 15, but a cardiac index of 1.7 by Fick and 1.6 by  thermodilution.   Importantly, prior to her valve surgery she was noted to have  approximately a 50% stenosis of the left main coronary artery.  We felt  this was not significantly narrowed after evaluation by intravascular  ultrasound in October.  It was not bypassed.  She has not had any chest  pain.   CURRENT MEDICATIONS:  1. A multivitamin.  2. Coumadin.  3. Prozac 20 mg twice daily.  4. Lasix 40 mg daily.  5. Synthroid 100 mcg daily.  6. K-Dur 20 mEq twice daily.  7. Aspirin 81 mg daily.  8. Colace 100 mg twice daily.  9. Calcium.  10.Toprol XL 50 mg daily.   PHYSICAL EXAMINATION:  GENERAL:  She is chronically ill-appearing and  very thin.  VITAL SIGNS:  Heart rate 92, blood pressure 90/70, and weight of 134  pounds.  Weight is up 2 pounds from April 10.  NECK:  She has jugular venous pressure of approximately 9 cm.  There is  no thyromegaly or lymphadenopathy.  CHEST:  Respiratory effort is normal.  Lungs are clear to auscultation.  CARDIAC:  She has a nondisplaced point of maximal cardiac impulse.  She  has a regular rate and rhythm with crisp mechanical S1 and S2.  There is  no murmur.  ABDOMEN:  Soft, nondistended, nontender.  There is no  hepatosplenomegaly.  Bowel sounds are normal.  EXTREMITIES:  Warm and without edema.  VASCULAR:  Carotid pulses are 2+ bilaterally and there are no bruits.   LABORATORY DATA:  Remarkable for TSH on March 14 of 7.0, free T4 that  day of 1.12, which is normal.  CBC on April 10 remarkable for hematocrit  of 35, hemoglobin 11.7, WBC 7.8, platelet count of 450.  On April 10 the  sodium was 141, potassium 3.7, creatinine 1.0.  Urine metanephrines to  assess her adrenal mass were normal.  On March  5 a brain natriuretic  peptide was 230.   IMPRESSION/RECOMMENDATIONS:  Dyspnea:  Based on right heart  catheterization last week she is euvolemic.  Echocardiogram has shown  appropriate prosthetic valve function.  I have been unable to find any  evidence for infection, pheochromocytoma, significant thyroid  abnormality.  In short, I do not understand why she is making no  progress in recovery.  It seems that she has been trying to comply with  cardiac rehab.  Clearly deconditioning is playing a role.  However, her  symptomatology does seem worse than I would expect from deconditioning  alone.   I certainly do consider that her left main stenosis could be a  contributor.  However, the complete absence of chest pain argues  somewhat against this.   I have requested that she see Dr. Merryl Hacker at [facility], whom she saw  preoperatively, for a second opinion regarding her very slow recovery.  This will happen on April 30.     Salvadore Farber, MD  Electronically Signed    WED/MedQ  DD: 09/23/2006  DT:  09/24/2006  Job #: [id number]

## 2010-10-12 NOTE — Cardiovascular Report (Signed)
NAME:  Grace [name], Grace [name]                        ACCOUNT NO.:  [id number]   MEDICAL RECORD NO.:  [id number]                   PATIENT TYPE:  OIB   LOCATION:  2901                                 FACILITY:  [facility]   PHYSICIAN:  Salvadore Farber, M.D.             DATE OF BIRTH:  March 07, 1951   DATE OF PROCEDURE:  04/07/2003  DATE OF DISCHARGE:  04/08/2003                              CARDIAC CATHETERIZATION   PROCEDURE:  Left and right heart catheterization, left ventriculography,  coronary angiography.   INDICATIONS:  Grace [name] is a 60 year old lady with a distant history of  Hodgkin's lymphoma for which she received mediastinal radiation therapy.  She now presents with an episode of awakening at night with shortness of  breath.  BNP the following day was 400.  She has been treated with Lasix  with control no recurrence in her symptoms.  She feels that her exercise  tolerance is normal or very nearly so.  She had an ETT Cardiolite that  demonstrated normal LV systolic function and no evidence of ischemia.  Due  to her prior mediastinal radiation therapy and its propensity to induce  ostial coronary stenoses, I was concerned with the possibility of balanced  ischemia and recommended cardiac catheterization.  I plan to perform right  heart catheterization at the time to further evaluate what appeared to be  moderate mitral valve disease and mild aortic stenosis by echocardiogram.   PROCEDURAL TECHNIQUE:  Informed consent was obtained.  Under 1% lidocaine  local anesthesia a 6-French sheath was placed in the right femoral artery  and an 8-French sheath in the right femoral vein using the modified  Seldinger technique.  Right heart catheterization was performed using a  balloon tipped catheter.  Cardiac output was calculated using the  thermodilution technique.  Left heart catheterization and ventriculography  were performed using a pigtail catheter.  Coronary angiography was then  performed  using JL4 and JR4 catheters.  The patient tolerated the procedure  well and was transferred to the holding room in stable condition.   COMPLICATIONS:  None.   FINDINGS:  1. Hemodynamics:     a. RA 9/8/7.     b. RV 45/5/7.     c. PA 41/24/34.     d. PCW 23/33/23 (very prominent V-waves).     e. Aorta 162/90/123.     f. LV 164/7/15.     g. Cardiac output/index 6.5/3.8 (thermodilution).  2. No aortic stenosis on pullback.  3. Mitral stenosis with peak gradient of 8, mean gradient of 11, and mitral     valve area of 2.7 sq cm.  4. Ventriculography:  EF 65% without regional wall motion abnormality.     There is 1+ mitral regurgitation.   CORONARY ANGIOGRAPHY:  1. Left main:  40% ostial stenosis.  This was reviewed with Charlies Constable,     M.D. who agreed that it was not a significant stenosis.  2. LAD:  Moderate sized vessel reaching the apex of the heart and giving     rise to a single large diagonal.  It is angiographically normal.  3. Circumflex:  Large, codominant vessel giving rise to a large branching     obtuse marginal and a PDA.  It is angiographically normal.  4. RCA:  Small, codominant vessel.  It is angiographically normal.   IMPRESSION/RECOMMENDATIONS:  1. Mild mitral stenosis with 1+ mitral regurgitation.  2. Trivial aortic stenosis.  3. 40% ostial left main stenosis.   The patient has markedly elevated pulmonary capillary wedge pressure despite  her diuresis.  This may well represent poor LV compliance likely secondary  to her radiation.  In addition, there is some component of mitral stenosis.  I have recommended TEE to further assess the mitral valve as images of this  were suboptimal on transthoracic echocardiogram.                                               Salvadore Farber, M.D.    WED/MEDQ  D:  05/05/2003  T:  05/05/2003  Job:  [id number]   cc:   [name], M.D. [facility]

## 2010-10-12 NOTE — Assessment & Plan Note (Signed)
[facility]                            CARDIOLOGY OFFICE NOTE   Grace [name], Grace [name]                     MRN:          [id number]  DATE:07/24/2006                            DOB:          07/18/1950    July 24, 2006   PRIMARY CARE PHYSICIAN:  [name], MD.   HISTORY OF PRESENT ILLNESS:  Grace [name] is a 60 year old lady with prior  mantle radiation for Hodgkin's disease.  She underwent aortic and mitral  valve replacements by Dr. [name] at [facility] on June 13, 2006, for what  was probably radiation valvulopathy.  Postoperative course was  complicated by pancreatitis and profound volume overload.  She ended up  being in the hospital for the better part of two weeks.  She saw Dr.  [name] in follow-up on July 10, 2006.  He released her back to my  care.   She continues to be dyspneic with fairly modest exertion such as walking  feet of level ground.  Her edema has not, however, recurred.  Her pain  has been minimal and she has not had any pain concerning for angina.  She has had no presyncope or syncope.   CURRENT MEDICATIONS:  1. Multivitamin.  2. Coumadin as directed by our [facility].  3. Lasix 80 mg daily.  4. Metoprolol succinate 25 mg daily.  5. Wellbutrin XL 150 mg daily.  6. Fluoxetine 20 mg nightly.  7. Synthroid 100 mcg daily.  8. K-Dur 20 mEq twice daily.  9. Aspirin 81 mg daily.  10.Colace 100 mg twice daily.  11.Calcium twice daily.   PHYSICAL EXAMINATION:  GENERAL APPEARANCE:  She is thin, in no distress.  VITAL SIGNS:  Heart rate 100, blood pressure 102/72, weight 136 pounds.  NECK:  She has no jugular venous distension, thyromegaly or  lymphadenopathy.  CHEST:  Median sternotomy is nicely healed.  LUNGS:  Clear to auscultation, though the right base is dull to  percussion approximately one quarter of the way up.  ABDOMEN:  Soft, nondistended and nontender.  There is no  hepatosplenomegaly.  Bowel sounds  are normal.  EXTREMITIES:  Warm without clubbing, cyanosis, edema or ulceration.  Carotid pulses 2+ bilaterally without bruit.   Electrocardiogram demonstrates normal sinus rhythm at 100 beats per  minute with left bundle branch block.  The left bundle branch block is  new compared with March 07, 2006.   IMPRESSION/RECOMMENDATIONS:  1. Slow recovery after aortic and mitral valve replacements using      mechanical valves.  Will check BNP, CMET, echocardiogram, CBC and      chest x-ray to assess for causes of dyspnea.  2. Possible recurrent right pleural effusion:  Check chest x-ray.  3. New left bundle branch block related to mitral valve surgery.  4. Possible coronary disease:  Patient had moderate ostial left main      stenosis which was not bypassed.  This was assessed with      intravascular ultrasound in October, prior to surgery.  5. Wolff-Parkinson-White syndrome by electrocardiogram previously.  6. Status post bilateral breast implantation.  7. Status  post left mastectomy and chemotherapy for breast cancer.  8. Migraine headaches.  9. Post-op pancreatitis: check lipase.  No abdominal pain.   I will plan on seeing her back in a week's time.  I will initiate  cardiac rehab at that time.     Salvadore Farber, MD  Electronically Signed    WED/MedQ  DD: 07/24/2006  DT: 07/24/2006  Job #: [id number]   cc:   [name], MD

## 2010-10-12 NOTE — Cardiovascular Report (Signed)
Grace [name], Grace [name]              ACCOUNT NO.:  [id number]   MEDICAL RECORD NO.:  [id number]          PATIENT TYPE:  OIB   LOCATION:  1962                         FACILITY:  [facility]   PHYSICIAN:  Salvadore Farber, MD  DATE OF BIRTH:  [birth year]   DATE OF PROCEDURE:  DATE OF DISCHARGE:                            CARDIAC CATHETERIZATION   PROCEDURE:  Right heart catheterization.   INDICATION:  [name] is a 59 year old woman, now three months status  post replacement of the aortic valve and mitral valves for probable  radiation valvulopathy.  She has had persistent dyspnea with minimal  exertion since then.  Evaluation thus far has been unrevealing as to the  etiology.  She therefore presents today for a right heart  catheterization to assess filling pressures.   PROCEDURAL TECHNIQUE:  Informed consent was obtained.  The patient was  left on her Coumadin to avoid needing to bridge, given her prosthetic  aortic and mitral valves.  INR was 2.4.  Under 1% lidocaine local  anesthesia, a 7 French sheath was placed in the right common femoral  vein using modified Seldinger technique.  Right heart catheterization  was performed using a balloon-tipped catheter.  Cardiac output was  assessed using both thermodilution and Fick techniques.  The catheter  and sheath were removed.  Manual compression applied.  Complete  hemostasis was obtained.  Extended pressure was given due to her  coagulopathy.  She was then transferred to the holding room in stable  condition, having tolerated the procedure well.   COMPLICATIONS:  None.   FINDINGS:  1. RA 12/11/9, RV 34/0/8, PA 32/17/23, PCW 15/14/12, cardiac      output/index:  Fick:  2/8/1.7; thermodilution 2/6/1.6.   IMPRESSION/PLAN:  The patient has essentially normal cardiac filling  pressures with low cardiac output.  The aortic prosthesis is well seen  and has normal leaflet opening.  The mitral prosthesis is not well seen.  Specifically, no ring  is noted.      Salvadore Farber, MD  Electronically Signed     WED/MEDQ  D:  09/08/2006  T:  09/08/2006  Job:  [id number]   cc:   [name], MD

## 2010-10-12 NOTE — H&P (Signed)
Grace [name], Grace [name]              ACCOUNT NO.:  [id number]   MEDICAL RECORD NO.:  [id number]          PATIENT TYPE:  INP   LOCATION:  2017                         FACILITY:  [facility]   PHYSICIAN:  Luis Abed, MD, FACCDATE OF BIRTH:  [birth year]   DATE OF ADMISSION:  07/30/2006  DATE OF DISCHARGE:                              HISTORY & PHYSICAL   PRIMARY CARDIOLOGIST:  Dr. Randa Evens.   PRIMARY CARE:  Dr. Birdie Sons.   [name] is a very pleasant 60 year old Caucasian female, status post  recent aortic valve and mitral valve replacements by Dr. [name] at [facility] on June 13, 2006, for what was probably radiation  valvulopathy.  Postoperatively the patient suffered complications,  including pancreatitis and recurrent pleural effusion.  She underwent a  thoracentesis approximately one week postop, then status post placement  of a right chest tube secondary to pleural effusion.  She ended up being  hospitalized for approximately two weeks for her valve repair.  She saw  Dr. Samule Ohm in followup on July 24, 2006, at which time she  complained of some ongoing dyspnea.  Dr. Samule Ohm scheduled the patient  for an echocardiogram, which patient underwent today.  Dr. Diona Browner, in  reading the patient's echo today, noted her heart rate to be maintaining  around 120.  The patient had already been sent home from the office, was  called and asked to return here to the emergency room at [facility] for  further evaluation, currently denied any palpitations or chest  discomfort.  She does complain of ongoing shortness of breath.  In  further discussion with the patient, she states Saturday afternoon she  did feel achy, sort of as if she were getting the flu.  She experienced  some sweats and chills.  Also the last two nights has woke up at night  completely saturated from diaphoresis.  Apparently was released from Dr.  Benjamine Sprague care on July 10, 2006.  In her followup  appointment on  July 24, 2006, he did order a chest x-ray at that time that shows a  small right pleural effusion, as he was questioning the return of  pleural effusion then, in regards to her dyspnea.  The EKG also done at  his appointment showed a heart rate of 100 with new left bundle branch  block.  Ms. Pehrson states compliance with her Coumadin therapy.  Her last  INR on Wednesday a week ago was 3.3.  Previous INR documented on  July 15, 2006, was 1.9.  The patient states she has had several  adjustments in her Coumadin dose.   ALLERGIES:  NO KNOWN DRUG ALLERGIES.   MEDICATIONS:  Currently include:  1. Coumadin.  2. Synthroid 100 mcg.  3. Lasix 80 daily.  4. Potassium chloride 20 b.i.d.  5. Toprol-XL 25.  6. Aspirin 81.  7. Prozac 20 nightly.  8. Multivitamins.  9. Wellbutrin XL 150 nightly.   PAST MEDICAL HISTORY:  Somewhat extensive.  1. The patient is status post recent aortic and mitral valve      replacements using mechanical valves on June 13, 2006, by Dr.      [name] at [facility] secondary to probable radiation      valvulopathy.  2. Prior to this, in 1974, the patient diagnosed with Hodgkin      lymphoma, status post chemo and radiation therapy at that time.  3. In 1992 diagnosed with breast cancer.  Underwent chemotherapy and      left mastectomy.  Status post breast implantation.  4. History of Wolff-Parkinson-White syndrome, nontreated.  5. Left bundle branch block which is new.  6. Ongoing migraines.  7. Coronary artery disease, status post cardiac catheterization in      October of 2007 that showed left main had ostial 50% to 60%      stenosis not treated during recent surgery.  8. Status post 2D echocardiogram today showing an EF of 50% to 55% in      a setting of resting tachycardia.  Right atrial size was normal      with a nonmobile echodensity noted at base of atrium, questionable      mural thrombus.  There was also a small pericardial  effusion.   SOCIAL HISTORY:  The patient lives in [place] with her husband.  She  is a Pension scheme manager.  They have no children.  She denies ever  using tobacco, EtOH, drugs, or herbal medication.  She follows a heart  healthy diet.  She routinely exercises on her treadmill.   FAMILY HISTORY:  Mother alive.  She is status post valve replacement and  is on anticoagulation therapy.  Also has a history of coronary artery  disease.  Father deceased at a young age secondary to accident.   REVIEW OF SYSTEMS:  Positive for fever, chills, sweats, headaches which  are chronic for patient.  Mild nosebleeds noted when blowing nose.  CARDIOPULMONARY:  Positive for shortness of breath and dyspnea on  exertion.  Denies any edema, palpitations, orthopnea, or PND.  All her  systems are negative per patient.   PHYSICAL EXAMINATION:  VITAL SIGNS:  Temperature is 98, heart rate 120,  respirations 16, blood pressure 122/51.  The patient is satting 95% on  room air.  GENERAL:  She is in no acute distress.  Very pleasant, cooperative,  Caucasian female.  HEENT:  Normocephalic, atraumatic.  Pupils equal, round, reactive to  light.  Sclerae clear.  NECK:  Supple without lymphadenopathy.  No bruits.  No JVD noted.  CARDIOVASCULAR:  The patient is tachycardic, difficult to auscultate S1,  maybe not so crisp; S1 crisp valve closure.  No obvious murmurs, rubs.  LUNGS:  She has decreased breath sounds in right base.  SKIN:  Warm and dry.  ABDOMEN:  Soft, nontender, positive bowel sounds.  EXTREMITIES:  Lower extremities without clubbing, cyanosis, or edema.  NEUROLOGICAL:  The patient alert and oriented x3.  Cranial nerves II-XII  grossly intact.   Chest x-ray is pending at this time.  EKG shows sinus tachycardia with  first degree AV block.  Interventricular conduction delay.  Lab work is  also pending.  Dr. Jerral Bonito in to examine and assess patient with  problem list as stated below.  1.  Status post Hodgkin remote history, chemotherapy and radiation      treatment.  2. Status post left breast chemotherapy and mastectomy.  3. Coronary artery disease, not bypassed.  4. Wolff-Parkinson-White syndrome not treated at time of surgery.  5. Ejection fraction currently 50% to 55% by echocardiogram.  6. Status post aortic valve  replacement and mitral valve replacement      in January of this year, resulting in postoperative complications,      including pancreatitis and recurrent pleural effusion.  7. Recent development of fever, chills, and sweats the past few days.  8. Tachycardia at rest.  9. Thyroid replacement.  Decreasing dose the past year per primary      care physician.  10.Target in RA per echocardiogram interpretation.  11.Right pleural effusion.  12.Ongoing dyspnea.   The patient will be admitted.  Must rule out endocarditis.  Rule out  pulmonary embolus.  Check thyroid.  Treat pleural effusion if large.  Will obtain blood cultures and prophylactically treat with antibiotics.  The patient will also need a TEE, possible right heart catheterization  to rule out constriction.  Will continue with anticoagulation therapy  and hold on CT of the chest at this time.  Will continue beta-blocker at  current dose, as the patient is asymptomatic with resting heart rate of  120.      Dorian Pod, ACNP      Luis Abed, MD, [facility]  Electronically Signed    MB/MEDQ  D:  07/30/2006  T:  07/31/2006  Job:  [id number]

## 2010-10-12 NOTE — Cardiovascular Report (Signed)
Grace [name], Grace [name]              ACCOUNT NO.:  [id number]   MEDICAL RECORD NO.:  [id number]          PATIENT TYPE:  OIB   LOCATION:  1961                         FACILITY:  [facility]   PHYSICIAN:  Salvadore Farber, MD  DATE OF BIRTH:  25-Jun-1950   DATE OF PROCEDURE:  DATE OF DISCHARGE:  03/13/2006                              CARDIAC CATHETERIZATION   PROCEDURES:  1. Left and right heart catheterization.  2. Left ventriculography.  3. Coronary angiography.  4. Intravascular ultrasound of the LAD and left main.  5. Angio-Seal closure of the right, from the arteriotomy site.   INDICATION:  Grace [name] is a 60 year old lady who had a history of prior  mantle radiation who has mitral stenosis and aortic insufficiency.  She  presents with new fatigue and dyspnea.  Echocardiogram has shown  preservation of left ventricular systolic function, moderate aortitic  insufficiency, and mitral stenosis with a mean gradient of 14 mm of mercury.  She presents for cardiac catheterization to further assess her hemodynamic,  her valvular disease, and also assess her coronary anatomy.   PROCEDURAL TECHNIQUE:  Informed consent was obtained.  Under 1% lidocaine  local anesthesia, 7-French sheath was placed in the right femoral vein, and  a 6-French sheath in the right femoral artery, using the modified Seldinger  technique.  Right heart catheterization was performed using a balloon-tip  catheter.  Cardiac output was measured using the Fick technique.  A 4-French  pigtail catheter was then advanced into the ascending aorta.  Simultaneous  aortic and left femoral arterial pressures were obtained.  The pigtail  catheter was then advanced into the left ventricle.  Simultaneous left  ventricular and femoral arterial pressures were obtained.  Simultaneous LV  and wedge pressures were then obtained.  Left ventriculography was performed  by power injection.  The pigtail catheter was then pulled back to the  aortic  root, and root aortography performed by power injection.   Coronary angiography was then performed using 5-French JL4 and JR4  catheters.  The JL4 catheter ventricularized.  We downsized to a 4-French  JL3.5.  This also damped.  There was, however, some reflux around both the 4-  and 5-French catheters into the aorta.  There was clearly some ostial left  main lesion.  However, there was some discordance between the repetitive  ventricularization with multiple catheters and the angiographically mild to  moderate stenosis seen.  To better assess the ostium of the left main, I  decided to proceed to intravascular ultrasound.   Anticoagulation was initiated with heparin to achieve an ACT of greater than  250 seconds.  Sheath was upsized over a wire to 6-French.  A 6-French JL3.5  guide was advanced over wire and engaged in the ostium of the left main.  A  Luge wire was advanced in an LAD without difficulty.  I then advanced  intervascular ultrasound catheter into the LAD and performed automated  pullback.  Catheters were then removed over a wire.  The arteriotomy was  closed using an Angio-Seal.  Complete hemostasis was obtained.  She was then  transferred to  the holding room in stable condition having tolerated the  procedure well.   COMPLICATIONS:  None.   FINDINGS:  1. Hemodynamics:  RA 1/0/0, PA 39/24/33, PCW at 25/25/19, LV 142/2/5,      aorta 124/67/90, cardiac output/index 5.3/3.1.  Aortic stenosis peak      gradient 18, mean gradient 14, with a calculated valve area of 2.7 sq      cm.  The peak gradient is slightly underestimated due to using the      femoral arterial pressure.  Adding the difference in the femoral      arterial pressure to the ascending aortic pressure increases the peak      gradient to 30 mm of mercury, but increases the mean gradient to only      16.  There is mitral stenosis with a peak gradient of 14, mean gradient      of 14, and calculated valve  area of 1.6 sq cm.  2. Ventriculography:  Normal left ventricular size and systolic function.      There is trace mitral regurgitation.  3. Ascending aortography:  There is mild aortic insufficiency with normal-      sized ascending aorta and root.   CORONARY ANGIOGRAPHY:  1. Left main:  Ostial 50-60% stenosis.  The remainder of the vessel is      normal.  2. LAD:  Relatively large vessel giving routes to 2 moderate-sized      diagonals.  It is angiographically normal.  3. Circumflex:  Moderate-sized vessel giving route to a marginal and a      branching posterior left ventricular branch.  It is angiographically      normal.  4. RCA:  Small, codominant vessel.  It is angiographically normal.   IMPRESSION/PLAN:  Patient has moderate to severe mitral stenosis with mild  aortic insufficiency and mild aortic stenosis.  She also has a moderate  ostial stenosis of the left main coronary artery.  We will refer her to [facility]  for further input.      Salvadore Farber, MD  Electronically Signed     WED/MEDQ  D:  03/15/2006  T:  03/16/2006  Job:  [id number]

## 2010-10-12 NOTE — Assessment & Plan Note (Signed)
[facility]                              CARDIOLOGY OFFICE NOTE   JAYDEEN, Grace [name]                     MRN:          [id number]  DATE:03/07/2006                            DOB:          04/12/1951    PRIMARY CARE PHYSICIAN:  [name], MD.   HISTORY OF PRESENT ILLNESS:  Ms. Grace [name] is a 60 year old lady with probable  radiation valvulopathy and resultant, at least moderate, aortic  insufficiency and moderate mitral stenosis with mild mitral regurgitation.  She has previously had mantle radiation for Hodgkin's disease.  She has also  had breast cancer but did not receive radiation therapy for that.   In February of 2005, she completed 9 minutes and 30 seconds of the standard  Bruce protocol without significant dyspnea.  However, over the past six  months she has become more dyspneic.  She says she cannot go up two flights  of stairs without stopping to rest.  She also is profoundly fatigued.  She  works her day as a Engineer, site and then goes to sleep between 6:30 and 8  p.m. every night.  She has not had any chest pain, has not had any resting  dyspnea, orthopnea, PND, or palpitations.  No syncope or presyncope.   PAST MEDICAL HISTORY:  1. Hodgkin's lymphoma in 1974, status post chemotherapy and mediastinal      radiation therapy.  2. Status post left mastectomy and chemotherapy for breast cancer.  3. Status post bilateral breast implantation.  4. Status post tonsillectomy and adenoidectomy.  5. Status post endometrial ablation.  6. Wolfe-Parkinson-White syndrome by electrocardiogram.  7. Migraines.   ALLERGIES:  NKDA.   CURRENT MEDICATIONS:  1. Multivitamin.  2. Calcium plus D.  3. Synthroid 125 mcg per day.  4. K-Dur 20 mEq twice per day.  5. Prozac 20 mg three times per day.  6. Colace.  7. Norvasc 10 mg per day.  8. Lasix 20 mg twice per day.  9. Cymbalta 60 mg per day.  10.She has Imitrex that she uses on a p.r.n.  basis for migraines.   SOCIAL HISTORY:  The patient is a married Runner, broadcasting/film/video.  No children.  Accompanied by her husband today.  Never smoked.  Denies alcohol use.   FAMILY HISTORY:  Father died at 52 of an accident.  Mother is alive with  hypercholesterolemia and coronary disease and is status post a valve repair.   REVIEW OF SYSTEMS:  Has had migraines twice per year.  Wears contacts.  Partial upper dentures.  Otherwise negative in detail except as above.   PHYSICAL EXAMINATION:  GENERAL:  This is a thin, generally well-appearing  woman in no distress.  VITAL SIGNS:  Heart rate 100, blood pressure 116/78, and weight of 141  pounds.  Weight is stable.  HEENT:  Normal.  SKIN EXAM:  Normal.  NECK:  She has no jugulovenous distention, no thyromegaly, no  lymphadenopathy.  LUNGS:  Clear to auscultation.  Respiratory effort normal.  Status post  bilateral breast implantation.  HEART:  She has a nondisplaced point of maximal cardiac impulse.  There is a  regular rate and rhythm with a 2/6 systolic ejection murmur best heard along  the right sternal border but heard well across the precordium.  There is a  1/6 diastolic murmur heard best along the left sternal border, but also at  the apex.  ABDOMEN:  Soft, nondistended, nontender.  No hepatosplenomegaly.  Bowel  sounds are normal.  EXTREMITIES:  Warm without clubbing, cyanosis, edema, or ulceration.   Carotid pulses 2+ bilaterally without bruit.  Femoral pulses 2+ bilaterally  without bruit.  DP pulses 2+ bilaterally.   Electrocardiogram demonstrates sinus tachycardia with an occasional fusion  complex.  There is right atrial enlargement, which is new compared with  prior electrocardiogram.  No left atrial enlargement.   IMPRESSION/RECOMMENDATION:  Mixed mitral and aortic valve disease probably  due to radiation valvulopathy.  Progression of symptoms are concerning.  We  will plan right and left heart catheterization to assess the mitral  valve in  particular, transesophageal echocardiogram in April showed moderate aortic  insufficiency with a central jet, tricuspid valve with mild retraction of  the leaflets, left ventricular systolic function was normal, there was mild  thickening of the mitral valve with mild mitral regurgitation, and a mean  transmitral gradient of 13 mmHg with a calculated mitral valve area by  pressure half-time of 2 cm/2.  I am concerned with her progressive dyspnea.  The right atrial enlargement new on the electrocardiogram also raises a  concern for pulmonary hypertension perhaps due to her mitral valve disease.  Will proceed to right and left heart catheterization and refer her to Dr.  Merryl Hacker with this data in hand.       Salvadore Farber, MD     WED/MedQ  DD:  03/07/2006  DT:  03/10/2006  Job #:  [id number]   cc:   [name], MD

## 2010-10-12 NOTE — Discharge Summary (Signed)
Grace [name], Grace [name]              ACCOUNT NO.:  [id number]   MEDICAL RECORD NO.:  [id number]          PATIENT TYPE:  INP   LOCATION:  2017                         FACILITY:  [facility]   PHYSICIAN:  Salvadore Farber, MD  DATE OF BIRTH:  1951-02-18   DATE OF ADMISSION:  07/30/2006  DATE OF DISCHARGE:  08/11/2006                               DISCHARGE SUMMARY   PRIMARY CARDIOLOGIST:  Dr. Randa Evens.   PRIMARY CARE PHYSICIAN:  Dr. Birdie Sons.  She is also followed by  [facility].   PRINCIPAL DIAGNOSIS:  Sinus tachycardia of unknown etiology.   SECONDARY DIAGNOSES:  1. History of Hodgkin lymphoma, status post chemotherapy and radiation      in 1974.  2. Breast cancer in 1992 status post chemotherapy and left mastectomy      and left breast implantation.  3. Status post aortic and mitral valve placements by Dr. [name] at      [facility] June 05, 1998.  The aortic valve      with a 21-mm St. Jude Regent valve.  The mitral valve with a 25-mm      St. Jude standard valve.  4. Wolff-Parkinson-White syndrome.  5. Ongoing migraine headaches.  6. Nonobstructive coronary artery disease by cardiac catheterization      October 2007.  7. Hypothyroidism.  8. Mild anemia.  9. Left retroperitoneal hemorrhage.  10.Left adrenal mass with negative biopsy on this admission.   ALLERGIES:  NO KNOWN DRUG ALLERGIES.   PROCEDURES:  1. 2D echocardiogram.  2. Transesophageal echocardiogram.  3. CT of the chest.  4. MRI of the abdomen.  5. A CT-guided biopsy of left adrenal mass.   HISTORY OF PRESENT ILLNESS:  Ms. Grace [name] is a 60 year old, married,  Caucasian female with the above problem list, who is recently status  post MVR, AVR, at [facility] as outlined above,  secondary to post radiation and valvulopathy.  Ms. Grace [name] recently saw Dr.  Samule Ohm in the outpatient clinic on July 24, 2006, at which time she  complained of  ongoing dyspnea.  An echocardiogram was scheduled in our  office and it was performed on July 30, 2006, and although echo showed  normal LV function with normal operating valve of prosthetic valves, she  was also noted to be tachycardiac with a question of a mural thrombus at  the base of the right atrium.  Secondary to her tachycardia without  explanation, the patient was contacted at home and advised to present to  [facility] for admission and evaluation.   HOSPITAL COURSE:  Following admission the patient went to have CT of the  chest to rule out PE and this was negative for pulmonary embolism.  However, revealed a 3 x 4-cm left adrenal mass.  Follow up MRI of the  abdomen was performed after confirming that it was safe to perform a MRI  on someone with St. Jude mechanical valves and this did not reveal  typical features of an adenoma by MRI.  However, was concerning for  possible metastasis based on its size.  It was recommended she undergo  percutaneous biopsy.  Her Coumadin was held and she was placed on a  heparin bridge.  In the meantime, a transesophageal echocardiogram was  performed to further evaluate question of a right atrial thrombus and  this showed again normal LV function with EF of 60%, no evidence of  perivalvular leaks or abscess, and no evidence of thrombus.  She then  underwent CT-guided biopsy on August 07, 2006, of the left adrenal mass.  Unfortunately she had a smaller left ventricular hematoma  periprocedurally.  As a result her heparin and Coumadin were not  immediately resume.  Pathology from her mass showed no significant  atypia or mitotic activity.  There was no evidence of metastatic  carcinoma or Hodgkin disease.  Oncology reviewed the pathology and  confirmed it is negative.   Grace [name] admission TSH was 13.820 raising concern of the under treated  hypothyroidism.  We repeated a TSH which was 7.001 as well as a free T4  which was normal at 1.12.  We  felt that likely her elevated TSH which  had been trending down is secondary to sick euthyroid rather than  hypothyroidism especially in light of normal free T4.  We have requested  that she have outpatient follow up and have continue her Synthroid dose  at 100 mcg daily.   Grace [name] has remained in sinus tachycardia throughout her  hospitalization with a rate anywhere from 100-115.  She has remained  asymptomatic and we have not been able to determine etiology for her  tachycardia.  We have titrated her beta blocker to Toprol XL 50 mg  daily.   Grace [name] will be discharged home today in good condition.  Secondary to  retroperitoneal hematoma from CT-guided biopsy, we have not been  bridging with either heparin or Lovenox.  Coumadin was reinitiated the  evening of August 10, 2006.  She will be discharged home today with plans  for follow up CBC and INR on Wednesday, August 13, 2006, as well as  Monday, August 18, 2006.   DISCHARGE LABS:  Hemoglobin 10.5, hematocrit 31.8, WBC 8.6, platelets  525, sodium 138, potassium 3.9, chloride 100, CO2 27, BUN 5, creatinine  28, glucose 100, calcium 9.4.  Total bilirubin 0.5, alkaline phosphatase  93, AST 22, ALT 19, total protein 7, albumin 2.9, TSH 7.011, Free T4  1.12.  CEA 2.8.  Urine metanephrine 127.  Her normetanephrine was 500.  Urinalysis was otherwise negative.   DISPOSITION:  Grace [name] is being discharged home today in good condition.   FOLLOW UP PLAN AND APPOINTMENTS:  She will follow up with [facility] for PT, INR, and CBC August 13, 2006, at 9 a.m.  This will be  followed up by Dr. Shelby Dubin.  She will also have follow up with PT,  INR, and CBC on August 18, 2006, at 9 a.m.  She will follow up with Dr.  Randa Evens on September 04, 2006, at 12 p.m.  She is asked to follow up  with Dr. Cato Mulligan for ongoing management of hypothyroidism.   DISCHARGE MEDICATIONS:  1. Aspirin 81 mg daily. 2. Coumadin 7.5 mg q.p.m.  3. Synthroid 100  mcg daily.  4. Lasix 40 mg daily.  5. Potassium 20 mEq b.i.d.  6. Toprol-XL 50 mg daily.  7. Wellbutrin 150 mg daily.  8. Colace 100 mg b.i.d.  9. Caltrate plus t.i.d.  10.Centrum Silver daily.  11.Prozac 20 mg q.p.m.  12.Imitrex as  previously prescribed.   OUTSTANDING LAB STUDIES:  None.   DURATION OF DISCHARGE ENCOUNTER:  60 minutes, including physician time.      Nicolasa Ducking, ANP      Salvadore Farber, MD  Electronically Signed    CB/MEDQ  D:  08/11/2006  T:  08/11/2006  Job:  [id number]   cc:   [name], MD  Shelby Dubin, PharmD, BCPS, CPP

## 2010-10-12 NOTE — Assessment & Plan Note (Signed)
[facility]                            CARDIOLOGY OFFICE NOTE   Grace [name], Grace [name]                     MRN:          [id number]  DATE:09/04/2006                            DOB:          10/31/1950    PRIMARY CARE PHYSICIAN:  [name], MD   HISTORY OF PRESENT ILLNESS:  Grace [name] is a 60 year old lady who is  status post replacement of both the aortic and mitral valves by Dr.  Durwin Glaze at [facility] on January 18th, 2008, for what was probably radiation  valvulopathy. Postoperatively, she suffered pancreatitis and recurrent  pleural effusions that required an extended postoperative stay. Since  discharge, she has been persistently tachycardic and short of breath  with minimal exertion. She had a prolonged hospitalization from May 5 to  the 17th for evaluation of this. Extensive evaluation including  assessment for pulmonary embolism and endocarditis were unrevealing. She  was discharged home stable, but not substantially improved. She has been  in cardiac rehab and continues to be dyspneic with fairly minimal  exertion such as walking from her car into the office building. She has  not had any chest pain, orthopnea or PND.   CURRENT MEDICATIONS:  1. Multivitamin.  2. Coumadin.  3. Prozac 20 mg twice daily.  4. Synthroid 100 mcg daily.  5. K-Dur 20 mEq twice daily.  6. Aspirin 81 mg daily.  7. Colace.  8. Calcium.  9. Toprol XL 50 mg daily.  10.Lasix 40 mg daily.   PHYSICAL EXAMINATION:  She is thin and somewhat ill-appearing in no  distress. However, she does appear better than she did in the hospital.  Heart rate is 96, blood pressure 102/72 and weight of 132 pounds.  I find it difficult to assess her jugular veins. It looks as if they may  be elevated as high as 14 cm, but it is difficult to sort out venous  from arterial impulse in her thin neck.  There is no thyromegaly or lymphadenopathy.  Respiratory effort is normal.  LUNGS:  Clear  to auscultation.  She has a nondisplaced point of maximal cardiac impulse.  There is a  regular  rate and rhythm without murmur, rub or gallop. S1 and S2 were  crisp and mechanical.  ABDOMEN: Soft, nondistended and nontender. There is no  hepatosplenomegaly. Bowel sounds are normal.  EXTREMITIES: Are warm and without edema.   IMPRESSIONS/RECOMMENDATIONS:  1. She remains dyspneic with minimal exertion. I am not at all sure of      her volume status which is making treatment difficult. I therefore      recommended proceeding to right heart catheterization for      definitive assessment of this. Will continue her Coumadin and plan      on prolonged hold of her venous axis site.  2. Possible coronary disease. She had moderate left main stenosis at      the ostium which was not bypassed. We assessed this with an      intravascular ultrasound in October.  3. Wolff-Parkinson-White syndrome by electrocardiogram previously.  4. Status post bilateral breast implantation.  5. Status post mastectomy and chemotherapy for breast cancer.  6. History of Hodgkin's lymphoma, treated with mantle radiation.  7. History of migraine headaches.  8. Recent pancreatitis, now resolved.     Salvadore Farber, MD  Electronically Signed    WED/MedQ  DD: 09/04/2006  DT: 09/04/2006  Job #: [id number]   cc:   [name], MD

## 2010-10-12 NOTE — Consult Note (Signed)
Grace [name], Grace [name]              ACCOUNT NO.:  [id number]   MEDICAL RECORD NO.:  [id number]          PATIENT TYPE:  INP   LOCATION:  2017                         FACILITY:  [facility]   PHYSICIAN:  Lajuana Matte, MD  DATE OF BIRTH:  1950-12-06   DATE OF CONSULTATION:  08/04/2006  DATE OF DISCHARGE:                                 CONSULTATION   REASON FOR CONSULTATION:  Adrenal mass in a patient with a history of  lymphoma and breast cancer.   HISTORY OF PRESENT ILLNESS:  Grace [name] is a pleasant 60 year old white  female with a history of Hodgkin's disease diagnosed in 1974, status  post chemotherapy with MOPP, and radiation to the chest at [facility].  In 1992, she was diagnosed with left breast  cancer, undergoing left mastectomy, chemotherapy, and 2 years of  tamoxifen, discontinued due to increased vaginal bleeding.  Recently,  the patient has had radiotherapy-induced valvular damage, undergoing  aortic valve and mitral valve replacement at [facility].  The patient was admitted on July 30, 2006, for tachycardia  evaluation.  A CT angio of the chest was performed, on admission, to  rule out PE.  This was negative for PE, but demonstrated a large left  adrenal mass measuring 3.4 x 2.8 x 3.9 cm, along with pericardial  lymphadenopathy and right pleural effusion with pleural enhancement.  The patient is to undergo a CT-guided biopsy of the mass, once her pro  times are therapeutic, to determine a tissue diagnosis.  At this time,  she is feeling well and has no complaints.   PAST MEDICAL HISTORY:  1. History of Hodgkin's lymphoma, in 1974, as above.  2. History of breast cancer, status post left mastectomy in 1992.  3. New diagnosis of left bundle branch block.  4. History of Wolff-Parkinson-White syndrome, nontreated.  5. History of migraine.  6. History of CAD.  7. Ejection fraction 50% to 55% per echo.  8. Hypothyroidism due to  radiation.  9. Chronic Coumadin secondary to aortic and mitral valve replacement.  10.History of GI bleed secondary to radiation esophagitis in the past.   SURGERIES:  1. Status post aortic and mitral valve replacement with mechanical      valve June 13, 2006, at [facility] secondary      postradiation valvulopathy.  2. Bilateral mastectomy with bilateral implants.  3. Cardiac catheterization in October 2007.  4. Status post splenectomy in 1974.  5. Status post tonsillectomy.  6. Status post enterocele ablation for menorrhagia.   ALLERGIES:  NKDA.   CURRENT MEDICATIONS:  Aspirin, Wellbutrin, Prozac, Lasix, Synthroid,  Toprol, K-Dur, Xanax, Medrol, Zofran, Ambien.   REVIEW OF SYSTEMS:  Remarkable for low-grade fever, chills, night sweats  since June 13, 2006, fatigue, decreased appetite, she may lost about  5 pounds since her surgery in January, dyspnea on exertion, mild  nosebleed when blowing her nose, otherwise no hemoptysis.  No nausea,  vomiting, or diarrhea.  No constipation.  No blood in the stool, dysuria  or gross hematuria.  No musculoskeletal pain.  No  dysesthesias.   FAMILY HISTORY:  Mother alive, status post AVR, and a history of CAD.  Father died in a motor vehicle accident at 70.  She also has a family  history breast cancer.   SOCIAL HISTORY:  The patient is married.  She has no children.  She is a  Pension scheme manager.  Lives in [place].  Never smoked.  No  alcohol history.   PHYSICAL EXAMINATION:  GENERAL:  On physical exam, this is a 60 year old  white female in no acute distress, alert and oriented x3.  VITAL SIGNS:  Blood pressure 111/71, pulse rate 84, respirations 18,  temperature 98.2, pulse oximetry 100% in room air.  Weight 60 kilograms,  height 5 feet 6 inches.  HEENT:  Head is normocephalic, atraumatic.  PERRLA.  Oral mucosa without  thrush or lesions.  NECK:  Supple.  No cervical or supraclavicular masses.  LUNGS:   With decreased breath sounds on the right, clear on the left.  There is a well-healed sternal scar, status post AVR.  BREASTS:  Status post bilateral breast implants, no chest wall masses.  CARDIOVASCULAR:  Regular rate and rhythm without murmurs, rubs, or  gallops.  ABDOMEN:  Soft, nontender, bowel sounds x4.  No palpable spleen or  liver.  GU AND RECTAL:  Deferred.  EXTREMITIES:  No clubbing or cyanosis.  No edema.  SKIN:  Without lesions, bruising, or petechiae.  NEURO:  Nonfocal.   LABORATORY:  Hemoglobin 10.2, hematocrit 32.5, white count 9.6,  platelets 501, neutrophils 6.6, monocytes 0.9, MCV 85.4, lymphocytes  1.4.  PT 27.5, PTT 43, INR 2.4.  TSH 13.82.  Sodium 140, potassium 3.6,  BUN 7, creatinine 0.9, glucose 89, total bilirubin 0.5, alkaline  phosphatase 93, AST 22, ALT 19, total protein 7.0, albumin 2.9, calcium  8.9, lipase 60.  BNP 269.  Metanephrine pending.  Folic acid pending.  Ferritin pending.   RADIOLOGICAL STUDIES:  MRI of the abdomen, August 01, 2006, shows a left  adrenal mass of 3.4 x 2.8 x 3.9 cm, nonspecific, mildly hyperdense T2  signal, right adrenal is normal.  Moderate right pleural effusion with  heterogeneous signal and pleural enhancement on the right, no well-  defined pleural masses.  No left pleural effusion.  Several prominent  lymph nodes including the left pericardial 2.3 cm lymph node and a 1.9 x  0.9 cm lymph node.  Some smaller lymph nodes on the left.  No liver  lesions.  Multiple large gallstones.  Normal pancreas.  The patient is  status post splenectomy.  No suspicious osseous lesions.   CT angio of the chest shows no PE, and the same findings as above,  cardiomegaly.   ASSESSMENT AND PLAN:  This is a 60 year old white female with a history  of Hodgkin's disease diagnosed in 1974, status post chemotherapy with  MOPP and XRT to the chest, and a history of left breast cancer status post left mastectomy in 1992, status post chemotherapy  and 2 years of  tamoxifen discontinued due to increased vaginal bleeding.  She is  admitted to [facility], secondary to rapid ventricular rate,  following status post aortic valve replacement and mitral valve  replacement at [facility] in January 2008.  CT  angiogram of the chest was performed on admission; negative for  pulmonary embolism, demonstrating a large left adrenal mass with  pericardial lymphadenopathy and pleural effusion.   RECOMMENDATIONS:  Questionable metastatic disease, with MRI of the  abdomen showing nonspecific  left adrenal mass, and similar results in  the CT angio of the chest.  CT-guided biopsy of the left adrenal mass is  recommended to rule out malignancy.  Also, Dr. Arbutus Ped has seen and  evaluated the patient, recommends a PET scan for staging workup to  identify a primary.  Will follow up with him once the biopsy results are  available.   Thank you very much for allowing [place] the opportunity to participate in  the care of Grace [name].      Marlowe Kays, P.A.      Lajuana Matte, MD  Electronically Signed    SW/MEDQ  D:  08/05/2006  T:  08/05/2006  Job:  [id number]   cc:   Salvadore Farber, MD  Ranelle Oyster, M.D.

## 2010-11-01 ENCOUNTER — Ambulatory Visit: Payer: BC Managed Care – PPO | Admitting: Internal Medicine

## 2010-11-01 DIAGNOSIS — Z952 Presence of prosthetic heart valve: Secondary | ICD-10-CM

## 2010-11-01 DIAGNOSIS — Z7901 Long term (current) use of anticoagulants: Secondary | ICD-10-CM

## 2010-11-01 LAB — POCT INR: INR: 3

## 2010-11-01 NOTE — Patient Instructions (Signed)
Same dose, 2.5 mg only on fridays then 5 mg on other days

## 2010-12-03 ENCOUNTER — Ambulatory Visit (INDEPENDENT_AMBULATORY_CARE_PROVIDER_SITE_OTHER): Payer: BC Managed Care – PPO | Admitting: Internal Medicine

## 2010-12-03 DIAGNOSIS — Z952 Presence of prosthetic heart valve: Secondary | ICD-10-CM

## 2010-12-03 DIAGNOSIS — Z954 Presence of other heart-valve replacement: Secondary | ICD-10-CM

## 2010-12-03 DIAGNOSIS — Z7901 Long term (current) use of anticoagulants: Secondary | ICD-10-CM

## 2010-12-03 LAB — POCT INR: INR: 1.7

## 2010-12-27 ENCOUNTER — Ambulatory Visit: Payer: BC Managed Care – PPO

## 2010-12-28 ENCOUNTER — Ambulatory Visit: Payer: BC Managed Care – PPO | Admitting: Internal Medicine

## 2010-12-28 DIAGNOSIS — Z952 Presence of prosthetic heart valve: Secondary | ICD-10-CM

## 2010-12-28 DIAGNOSIS — Z954 Presence of other heart-valve replacement: Secondary | ICD-10-CM

## 2010-12-28 DIAGNOSIS — Z7901 Long term (current) use of anticoagulants: Secondary | ICD-10-CM

## 2010-12-28 LAB — POCT INR: INR: 2.6

## 2011-01-08 ENCOUNTER — Encounter: Payer: Self-pay | Admitting: Internal Medicine

## 2011-01-24 ENCOUNTER — Ambulatory Visit (INDEPENDENT_AMBULATORY_CARE_PROVIDER_SITE_OTHER): Payer: BC Managed Care – PPO

## 2011-01-24 DIAGNOSIS — Z952 Presence of prosthetic heart valve: Secondary | ICD-10-CM

## 2011-01-24 DIAGNOSIS — Z954 Presence of other heart-valve replacement: Secondary | ICD-10-CM

## 2011-01-24 LAB — POCT INR: INR: 2

## 2011-01-24 NOTE — Patient Instructions (Signed)
5 mg everyday,check in 3 weeks  

## 2011-02-14 ENCOUNTER — Ambulatory Visit: Payer: BC Managed Care – PPO

## 2011-02-14 DIAGNOSIS — Z952 Presence of prosthetic heart valve: Secondary | ICD-10-CM

## 2011-02-14 DIAGNOSIS — Z954 Presence of other heart-valve replacement: Secondary | ICD-10-CM

## 2011-02-14 DIAGNOSIS — Z7901 Long term (current) use of anticoagulants: Secondary | ICD-10-CM

## 2011-02-14 LAB — POCT INR: INR: 2.9

## 2011-02-14 NOTE — Patient Instructions (Signed)
COUMADIN : Same dose, 5 mg everyday,check in 4 weeks

## 2011-02-15 ENCOUNTER — Other Ambulatory Visit: Payer: Self-pay | Admitting: Internal Medicine

## 2011-02-15 NOTE — Telephone Encounter (Signed)
Incoming call from pt stating she was in yesterday for an INR check and was going to ask about her coumadin.  Pt states [facility] told her some medications were called in but coumadin was not one of them.  Pls advise.

## 2011-02-18 MED ORDER — WARFARIN SODIUM 5 MG PO TABS: 5.0000 mg | ORAL_TABLET | Freq: Every day | ORAL | Status: DC

## 2011-02-18 NOTE — Telephone Encounter (Signed)
rx sent in electronically 

## 2011-02-24 ENCOUNTER — Inpatient Hospital Stay (INDEPENDENT_AMBULATORY_CARE_PROVIDER_SITE_OTHER)
Admission: RE | Admit: 2011-02-24 | Discharge: 2011-02-24 | Disposition: A | Discharge status: Home / Self Care | Payer: BC Managed Care – PPO | Source: Ambulatory Visit | Attending: Emergency Medicine | Admitting: Emergency Medicine

## 2011-02-24 DIAGNOSIS — L03019 Cellulitis of unspecified finger: Secondary | ICD-10-CM

## 2011-02-24 DIAGNOSIS — L02519 Cutaneous abscess of unspecified hand: Secondary | ICD-10-CM

## 2011-02-24 DIAGNOSIS — T148XXA Other injury of unspecified body region, initial encounter: Secondary | ICD-10-CM

## 2011-02-26 ENCOUNTER — Inpatient Hospital Stay (INDEPENDENT_AMBULATORY_CARE_PROVIDER_SITE_OTHER)
Admission: RE | Admit: 2011-02-26 | Discharge: 2011-02-26 | Disposition: A | Discharge status: Home / Self Care | Payer: BC Managed Care – PPO | Source: Ambulatory Visit | Attending: Emergency Medicine | Admitting: Emergency Medicine

## 2011-02-26 DIAGNOSIS — L02519 Cutaneous abscess of unspecified hand: Secondary | ICD-10-CM

## 2011-02-27 NOTE — Progress Notes (Signed)
[facility]  NAME:  Grace [name], AWAN              ACCOUNT NO.:  [id number]  MEDICAL RECORD NO.:  [id number]      DATE OF BIRTH:  Jun 11, 1950  PHYSICIAN:  Dionne Ano. Amanda Pea, M.D.      VISIT DATE:                                  OFFICE VISIT   I had the pleasure of seeing [facility].  She was bitten by a cat on Friday.  She presented to the Urgent Care on Sunday with pain and swelling and was placed on clindamycin and Augmentin.  She presented 48 hours later for wound check.  She still has pain and difficulty moving the finger.  I was asked to see her by the emergency room staff.  I have discussed these issues with her at length.  The patient was on Coumadin secondary to aortic and mitral valve replacement at [facility] in the distant past.  The patient and I have discussed all issues in detail.  She notes no fever, chills, nausea, vomiting, but does feel cold.  She states that she has 4 cats that are domesticated.  She notes no locking, popping, catching, numbness or tingling in the adjacent fingers, but does have a significant degree of pain and problems with motion to the index finger. I have reviewed this with her at length.  PAST MEDICAL HISTORY:  Include aortic and mitral valve replacement in the past.  She has a history of Hodgkin disease, splenectomy, breast cancer, paracardial surgery and notes that the Coumadin has been chronically coordinated by [facility].  She still sees [facility] for followup.  ALLERGIES:  None.  MEDICINES:  Reviewed in her chart.  SOCIAL HISTORY:  She lives with husband of many years.  She does not smoke.  She is a very pleasant female.  PHYSICAL EXAMINATION:  GENERAL:  Alert and oriented.  No acute distress. VITAL SIGNS:  Stable. HEENT:  Within normal limits.  She has no evidence of compartment syndrome in the upper extremities. CHEST:  Clear. ABDOMEN:  Nontender. EXTREMITIES:  Lower extremity examination is  benign and noted to be neurovascularly intact.  I have reviewed this at length and the findings.  Right index finger has dorsal and volar cat bite wounds with large amount of ecchymosis present.  The ecchymosis is rather robust and likely due to Coumadin use.  She has difficulty flexing and extending the finger, but does not have a completely positive Kanavel signs.  The patient and I have reviewed this at length.  She has pain directly over the midportion of the volar aspect of P2.  She also has pain with the dorsal aspect of her DIP joint primarily based ulnarly.  She does not have pain with passive extension of the digit.  She does not hold it in a sausage flexed position.  She is nontender over the A1 pulley.  The patient and I have discussed these issues at length.  I have reviewed these issues at length.  These notes have been discussed.  At present time, the patient has what appears to be early infectious process.  I have gone ahead and locally looked at the wound following looking at locally especially over the volar aspect of the finger.  She had small regression of purulence.  At this time,  I then discussed with her I would recommend a formal I and D and she consented to this.  The patient was taken to the procedural suite, underwent intermetacarpal block.  Following this, we then performed I and D and a sterile prep and drape.  The patient was sterilely prepped with Betadine scrub and paint. Following this, the patient then underwent a very careful and cautious I and D of the volar aspect of the finger.  She underwent I and D of the skin and subcutaneous tissue.  This was a deep abscess over the P2 region.  The flexor tendon sheath appeared to be intact.  There was no evidence of extreme violation of the sheath; however, I want to watch this very closely.  I and Died this region aggressively.  Following this, I turned attention dorsally where a separate I and D  was accomplished of the skin and subcutaneous tissue, less than 20 cm2.  The patient tolerated this well.  Following this, she was dressed sterilely with Adaptic, Xeroform, and gauze.  Wounds were packed with an Iodoform gauze.  She tolerated the procedure well.  The patient and I went over all issues.  We will continue clindamycin and Augmentin.  I would like for her to continue this for 14 days.  We went over Peri-Colace, vitamin C use, and other measures and I will see her back in the office in less than 24 hours for followup.  These notes have been discussed and all questions have been encouraged and answered.     Dionne Ano. Amanda Pea, M.D.     [facility]  D:  02/26/2011  T:  02/27/2011  Job:  [id number]

## 2011-02-28 LAB — WOUND CULTURE: Culture: NO GROWTH

## 2011-03-06 LAB — CBC
HCT: 30.6 — ABNORMAL LOW
Hemoglobin: 10 — ABNORMAL LOW
MCHC: 32.8
MCV: 88.9
Platelets: 401 — ABNORMAL HIGH
RBC: 3.44 — ABNORMAL LOW
RDW: 15.5 — ABNORMAL HIGH
WBC: 6.7

## 2011-03-06 LAB — HEPARIN LEVEL (UNFRACTIONATED): Heparin Unfractionated: 0.47

## 2011-03-06 LAB — PROTIME-INR
INR: 2.8 — ABNORMAL HIGH
Prothrombin Time: 30.6 — ABNORMAL HIGH

## 2011-03-07 LAB — COMPREHENSIVE METABOLIC PANEL
ALT: 20
AST: 31
Albumin: 3.5
Alkaline Phosphatase: 64
BUN: 15
CO2: 27
Calcium: 9.1
Chloride: 105
Creatinine, Ser: 1.02
GFR calc Af Amer: >60
GFR calc non Af Amer: 56 — ABNORMAL LOW
Glucose, Bld: 113 — ABNORMAL HIGH
Potassium: 3.6
Sodium: 139
Total Bilirubin: 0.6
Total Protein: 6.4

## 2011-03-07 LAB — CBC
HCT: 32.4 — ABNORMAL LOW
HCT: 32.7 — ABNORMAL LOW
Hemoglobin: 10.5 — ABNORMAL LOW
Hemoglobin: 10.6 — ABNORMAL LOW
MCHC: 32.1
MCHC: 32.6
MCV: 88.7
MCV: 90
Platelets: 394
Platelets: 413 — ABNORMAL HIGH
RBC: 3.64 — ABNORMAL LOW
RBC: 3.65 — ABNORMAL LOW
RDW: 15.2 — ABNORMAL HIGH
RDW: 15.5 — ABNORMAL HIGH
WBC: 7.1
WBC: 8.1

## 2011-03-07 LAB — DIFFERENTIAL
Basophils Absolute: 0.1
Basophils Relative: 1
Eosinophils Absolute: 0.3
Eosinophils Relative: 4
Lymphocytes Relative: 19
Lymphs Abs: 1.6
Monocytes Absolute: 1.1 — ABNORMAL HIGH
Monocytes Relative: 13 — ABNORMAL HIGH
Neutro Abs: 5.1
Neutrophils Relative %: 63

## 2011-03-07 LAB — URINALYSIS, ROUTINE W REFLEX MICROSCOPIC
Bilirubin Urine: NEGATIVE
Glucose, UA: NEGATIVE
Hgb urine dipstick: NEGATIVE
Ketones, ur: NEGATIVE
Nitrite: NEGATIVE
Protein, ur: NEGATIVE
Specific Gravity, Urine: 1.008
Urobilinogen, UA: 0.2
pH: 6.5

## 2011-03-07 LAB — I-STAT 8, (EC8 V) (CONVERTED LAB)
Acid-Base Excess: 5 — ABNORMAL HIGH
BUN: 14
Bicarbonate: 30.7 — ABNORMAL HIGH
Chloride: 103
Glucose, Bld: 137 — ABNORMAL HIGH
HCT: 37
Hemoglobin: 12.6
Operator id: 146091
Potassium: 3.5
Sodium: 141
TCO2: 32
pCO2, Ven: 46.9
pH, Ven: 7.424 — ABNORMAL HIGH

## 2011-03-07 LAB — HEMOGLOBIN A1C
Hgb A1c MFr Bld: 5.5
Mean Plasma Glucose: 119

## 2011-03-07 LAB — POCT CARDIAC MARKERS
CKMB, poc: <1 — ABNORMAL LOW
Myoglobin, poc: 98.7
Operator id: 146091
Troponin i, poc: <0.05

## 2011-03-07 LAB — DRUGS OF ABUSE SCREEN W/O ALC, ROUTINE URINE
Amphetamine Screen, Ur: NEGATIVE
Barbiturate Quant, Ur: NEGATIVE
Benzodiazepines.: NEGATIVE
Cocaine Metabolites: NEGATIVE
Creatinine,U: 26.6
Marijuana Metabolite: NEGATIVE
Methadone: NEGATIVE
Opiate Screen, Urine: NEGATIVE
Phencyclidine (PCP): NEGATIVE
Propoxyphene: NEGATIVE

## 2011-03-07 LAB — CK TOTAL AND CKMB (NOT AT ARMC)
CK, MB: 0.7
Relative Index: INVALID
Total CK: 55

## 2011-03-07 LAB — PROTIME-INR
INR: 2.1 — ABNORMAL HIGH
INR: 2.1 — ABNORMAL HIGH
Prothrombin Time: 23.9 — ABNORMAL HIGH
Prothrombin Time: 24.1 — ABNORMAL HIGH

## 2011-03-07 LAB — LIPID PANEL
Cholesterol: 143
HDL: 50
LDL Cholesterol: 75
Total CHOL/HDL Ratio: 2.9
Triglycerides: 92
VLDL: 18

## 2011-03-07 LAB — POCT I-STAT CREATININE
Creatinine, Ser: 1.3 — ABNORMAL HIGH
Operator id: 146091

## 2011-03-07 LAB — HEPARIN LEVEL (UNFRACTIONATED)
Heparin Unfractionated: 0.17 — ABNORMAL LOW
Heparin Unfractionated: 0.37

## 2011-03-07 LAB — APTT: aPTT: 55 — ABNORMAL HIGH

## 2011-03-07 LAB — HOMOCYSTEINE: Homocysteine: 10.5

## 2011-03-07 LAB — TROPONIN I: Troponin I: 0.01

## 2011-03-14 ENCOUNTER — Ambulatory Visit: Payer: BC Managed Care – PPO | Admitting: Internal Medicine

## 2011-03-14 DIAGNOSIS — Z952 Presence of prosthetic heart valve: Secondary | ICD-10-CM

## 2011-03-14 DIAGNOSIS — Z7901 Long term (current) use of anticoagulants: Secondary | ICD-10-CM

## 2011-03-14 LAB — POCT INR: INR: 2.7

## 2011-03-14 NOTE — Patient Instructions (Addendum)
  Latest dosing instructions   Total Glynis Smiles Tue Wed Thu Fri Sat   35 5 mg 5 mg 5 mg 5 mg 5 mg 5 mg 5 mg    (5 mg1) (5 mg1) (5 mg1) (5 mg1) (5 mg1) (5 mg1) (5 mg1)         Latest dosing instructions   Total Sun Mon Tue Wed Thu Fri Sat   25 5 mg 2.5 mg 5 mg 2.5 mg 2.5 mg 5 mg 2.5 mg    (5 mg1) (5 mg0.5) (5 mg1) (5 mg0.5) (5 mg0.5) (5 mg1) (5 mg0.5)

## 2011-03-21 ENCOUNTER — Ambulatory Visit (INDEPENDENT_AMBULATORY_CARE_PROVIDER_SITE_OTHER): Payer: BC Managed Care – PPO | Admitting: Internal Medicine

## 2011-03-21 DIAGNOSIS — Z23 Encounter for immunization: Secondary | ICD-10-CM

## 2011-03-21 DIAGNOSIS — Z952 Presence of prosthetic heart valve: Secondary | ICD-10-CM

## 2011-03-21 DIAGNOSIS — Z954 Presence of other heart-valve replacement: Secondary | ICD-10-CM

## 2011-03-21 LAB — POCT INR: INR: 2

## 2011-03-21 NOTE — Patient Instructions (Signed)
  Latest dosing instructions   Total Sun Mon Tue Wed Thu Fri Sat   35 5 mg 5 mg 5 mg 5 mg 5 mg 5 mg 5 mg    (5 mg1) (5 mg1) (5 mg1) (5 mg1) (5 mg1) (5 mg1) (5 mg1)        

## 2011-03-27 ENCOUNTER — Ambulatory Visit: Payer: BC Managed Care – PPO | Admitting: Internal Medicine

## 2011-03-27 DIAGNOSIS — Z7901 Long term (current) use of anticoagulants: Secondary | ICD-10-CM

## 2011-03-27 DIAGNOSIS — Z954 Presence of other heart-valve replacement: Secondary | ICD-10-CM

## 2011-03-27 DIAGNOSIS — Z952 Presence of prosthetic heart valve: Secondary | ICD-10-CM

## 2011-03-27 LAB — POCT INR: INR: 2.5

## 2011-03-27 NOTE — Patient Instructions (Signed)
  Latest dosing instructions   Total Sun Mon Tue Wed Thu Fri Sat   35 5 mg 5 mg 5 mg 5 mg 5 mg 5 mg 5 mg    (5 mg1) (5 mg1) (5 mg1) (5 mg1) (5 mg1) (5 mg1) (5 mg1)        

## 2011-03-28 ENCOUNTER — Ambulatory Visit: Payer: BC Managed Care – PPO

## 2011-04-23 ENCOUNTER — Encounter: Payer: Self-pay | Admitting: Internal Medicine

## 2011-04-24 ENCOUNTER — Ambulatory Visit (INDEPENDENT_AMBULATORY_CARE_PROVIDER_SITE_OTHER): Payer: BC Managed Care – PPO | Admitting: Internal Medicine

## 2011-04-24 ENCOUNTER — Encounter: Payer: Self-pay | Admitting: Internal Medicine

## 2011-04-24 DIAGNOSIS — Z7901 Long term (current) use of anticoagulants: Secondary | ICD-10-CM

## 2011-04-24 DIAGNOSIS — E039 Hypothyroidism, unspecified: Secondary | ICD-10-CM

## 2011-04-24 DIAGNOSIS — I509 Heart failure, unspecified: Secondary | ICD-10-CM

## 2011-04-24 DIAGNOSIS — E785 Hyperlipidemia, unspecified: Secondary | ICD-10-CM

## 2011-04-24 DIAGNOSIS — Z23 Encounter for immunization: Secondary | ICD-10-CM

## 2011-04-24 DIAGNOSIS — Z952 Presence of prosthetic heart valve: Secondary | ICD-10-CM

## 2011-04-24 DIAGNOSIS — D509 Iron deficiency anemia, unspecified: Secondary | ICD-10-CM

## 2011-04-24 DIAGNOSIS — Z853 Personal history of malignant neoplasm of breast: Secondary | ICD-10-CM

## 2011-04-24 LAB — CBC WITH DIFFERENTIAL/PLATELET
Basophils Absolute: 0 10*3/uL (ref 0.0–0.1)
Basophils Relative: 0.3 % (ref 0.0–3.0)
Eosinophils Absolute: 0.4 10*3/uL (ref 0.0–0.7)
Eosinophils Relative: 5.6 % — ABNORMAL HIGH (ref 0.0–5.0)
HCT: 36.2 % (ref 36.0–46.0)
Hemoglobin: 11.7 g/dL — ABNORMAL LOW (ref 12.0–15.0)
Lymphocytes Relative: 17.4 % (ref 12.0–46.0)
Lymphs Abs: 1.3 10*3/uL (ref 0.7–4.0)
MCHC: 32.3 g/dL (ref 30.0–36.0)
MCV: 92.9 fl (ref 78.0–100.0)
Monocytes Absolute: 1 10*3/uL (ref 0.1–1.0)
Monocytes Relative: 13.3 % — ABNORMAL HIGH (ref 3.0–12.0)
Neutro Abs: 4.9 10*3/uL (ref 1.4–7.7)
Neutrophils Relative %: 63.4 % (ref 43.0–77.0)
Platelets: 366 10*3/uL (ref 150.0–400.0)
RBC: 3.89 Mil/uL (ref 3.87–5.11)
RDW: 14.3 % (ref 11.5–14.6)
WBC: 7.7 10*3/uL (ref 4.5–10.5)

## 2011-04-24 LAB — IBC PANEL
Iron: 47 ug/dL (ref 42–145)
Saturation Ratios: 9 % — ABNORMAL LOW (ref 20.0–50.0)
Transferrin: 372.7 mg/dL — ABNORMAL HIGH (ref 212.0–360.0)

## 2011-04-24 LAB — HEPATIC FUNCTION PANEL
ALT: 30 U/L (ref 0–35)
AST: 44 U/L — ABNORMAL HIGH (ref 0–37)
Albumin: 4.7 g/dL (ref 3.5–5.2)
Alkaline Phosphatase: 61 U/L (ref 39–117)
Bilirubin, Direct: 0 mg/dL (ref 0.0–0.3)
Total Bilirubin: 0.5 mg/dL (ref 0.3–1.2)
Total Protein: 8 g/dL (ref 6.0–8.3)

## 2011-04-24 LAB — FERRITIN: Ferritin: 21 ng/mL (ref 10.0–291.0)

## 2011-04-24 LAB — BASIC METABOLIC PANEL
BUN: 18 mg/dL (ref 6–23)
CO2: 27 mEq/L (ref 19–32)
Calcium: 9.5 mg/dL (ref 8.4–10.5)
Chloride: 104 mEq/L (ref 96–112)
Creatinine, Ser: 1.1 mg/dL (ref 0.4–1.2)
GFR: 53.71 mL/min — ABNORMAL LOW (ref >60.00)
Glucose, Bld: 83 mg/dL (ref 70–99)
Potassium: 3.9 mEq/L (ref 3.5–5.1)
Sodium: 142 mEq/L (ref 135–145)

## 2011-04-24 LAB — LIPID PANEL
Cholesterol: 169 mg/dL (ref 0–200)
HDL: 60.9 mg/dL (ref >39.00)
LDL Cholesterol: 90 mg/dL (ref 0–99)
Total CHOL/HDL Ratio: 3
Triglycerides: 90 mg/dL (ref 0.0–149.0)
VLDL: 18 mg/dL (ref 0.0–40.0)

## 2011-04-24 LAB — TSH: TSH: 0.55 u[IU]/mL (ref 0.35–5.50)

## 2011-04-24 LAB — POCT INR: INR: 2.6

## 2011-04-24 MED ORDER — TRIAMCINOLONE ACETONIDE 0.025 % EX OINT: TOPICAL_OINTMENT | Freq: Two times a day (BID) | CUTANEOUS | Status: DC

## 2011-04-24 NOTE — Assessment & Plan Note (Signed)
Check labs today Continue meds 

## 2011-04-24 NOTE — Progress Notes (Signed)
Patient ID: Grace [name], female   DOB: 1950/11/03, 60 y.o.   MRN: [id number] Main complaint- muscle cramps mostly in the chest wall, can also happen in the legs. Frequently nocturnal.   Rash on hands---cracking left thumb... Can be painful  Interested in Shingles vaccine  On warfarin--no bleeding complications  Mitral valve replacement/Aortic valve replacement  Past Medical History  Diagnosis Date  . Anemia   . Cancer     breast  . Hypothyroidism   . GI bleed   . Migraine   . Hodgkin's disease   . WPW (Wolff-Parkinson-White syndrome)   . Aortic insufficiency   . Hyperlipidemia   . CHF (congestive heart failure)     History   Social History  . Marital Status: Married    Spouse Name: N/A    Number of Children: N/A  . Years of Education: N/A   Occupational History  . Not on file.   Social History Main Topics  . Smoking status: Never Smoker   . Smokeless tobacco: Not on file  . Alcohol Use: No  . Drug Use: No  . Sexually Active:    Other Topics Concern  . Not on file   Social History Narrative  . No narrative on file    Past Surgical History  Procedure Date  . Endometrial ablation   . Aortic valve surgery   . Mastectomy   . Mitral valve replacement   . Splenectomy   . Thoracotomy   . Valvulopathy 06/13/06  . Pericardial stripping 9/08  . Tonsillectomy   . Bilateral breast implants     Family History  Problem Relation Age of Onset  . Heart disease Mother   . Cancer Mother     breast  . Heart disease Maternal Aunt   . Cancer Maternal Aunt     uterine  . Heart disease Maternal Grandmother   . Diabetes Maternal Grandmother     No Known Allergies  Current Outpatient Prescriptions on File Prior to Visit  Medication Sig Dispense Refill  . aspirin 81 MG tablet Take 81 mg by mouth daily.        . Calcium Carbonate-Vitamin D (CALTRATE 600+D) 600-400 MG-UNIT per tablet Take 1 tablet by mouth daily.        Marland Kitchen docusate sodium (COLACE) 100 MG capsule  Take 100 mg by mouth 2 (two) times daily.        . furosemide (LASIX) 40 MG tablet TAKE 1 TABLET DAILY  90 tablet  0  . levothyroxine (SYNTHROID, LEVOTHROID) 125 MCG tablet TAKE 1 TABLET ONCE DAILY  90 tablet  2  . metoprolol (TOPROL-XL) 50 MG 24 hr tablet Take 50 mg by mouth daily.        . Multiple Vitamin (MULTIVITAMIN) tablet Take 1 tablet by mouth daily.        . potassium chloride SA (K-DUR,KLOR-CON) 20 MEQ tablet TAKE 1 TABLET DAILY  90 tablet  2  . warfarin (COUMADIN) 5 MG tablet Take 1 tablet (5 mg total) by mouth daily.  90 tablet  2     patient denies chest pain, shortness of breath, orthopnea. Denies lower extremity edema, abdominal pain, change in appetite, change in bowel movements. Patient denies rashes, musculoskeletal complaints. No other specific complaints in a complete review of systems.   BP 104/74  Pulse 84  Temp(Src) 98.2 F (36.8 C) (Oral)  Ht 5\' 6"  (1.676 m)  Wt 126 lb (57.153 kg)  BMI 20.34 kg/m2  Well-developed well-nourished female in  no acute distress. HEENT exam atraumatic, normocephalic, extraocular muscles are intact. Neck is supple. No jugular venous distention no thyromegaly. Chest clear to auscultation without increased work of breathing. Cardiac exam S1 and S2 are regular--mechanical heart sounds. Abdominal exam active bowel sounds, soft, nontender. Extremities no edema. Neurologic exam she is alert without any motor sensory deficits. Gait is normal. Derm---cracking skin of left thumb.

## 2011-04-24 NOTE — Patient Instructions (Signed)
  Latest dosing instructions   Total Sun Mon Tue Wed Thu Fri Sat   35 5 mg 5 mg 5 mg 5 mg 5 mg 5 mg 5 mg    (5 mg1) (5 mg1) (5 mg1) (5 mg1) (5 mg1) (5 mg1) (5 mg1)        

## 2011-04-24 NOTE — Assessment & Plan Note (Signed)
Check CBC today with iron studies

## 2011-04-24 NOTE — Assessment & Plan Note (Signed)
Has been controlled Check labs today

## 2011-04-26 NOTE — Progress Notes (Signed)
Quick Note:  Left a message for pt to return call. ______ 

## 2011-05-03 ENCOUNTER — Other Ambulatory Visit: Payer: Self-pay | Admitting: Internal Medicine

## 2011-05-16 ENCOUNTER — Other Ambulatory Visit: Payer: Self-pay | Admitting: Internal Medicine

## 2011-05-23 ENCOUNTER — Ambulatory Visit (INDEPENDENT_AMBULATORY_CARE_PROVIDER_SITE_OTHER): Payer: BC Managed Care – PPO | Admitting: Internal Medicine

## 2011-05-23 DIAGNOSIS — Z954 Presence of other heart-valve replacement: Secondary | ICD-10-CM

## 2011-05-23 DIAGNOSIS — Z952 Presence of prosthetic heart valve: Secondary | ICD-10-CM

## 2011-05-23 LAB — POCT INR: INR: 2.6

## 2011-05-23 NOTE — Patient Instructions (Signed)
  Latest dosing instructions   Total Sun Mon Tue Wed Thu Fri Sat   35 5 mg 5 mg 5 mg 5 mg 5 mg 5 mg 5 mg    (5 mg1) (5 mg1) (5 mg1) (5 mg1) (5 mg1) (5 mg1) (5 mg1)        

## 2011-06-20 ENCOUNTER — Ambulatory Visit: Payer: BC Managed Care – PPO | Admitting: *Deleted

## 2011-06-20 DIAGNOSIS — Z7901 Long term (current) use of anticoagulants: Secondary | ICD-10-CM

## 2011-06-20 DIAGNOSIS — Z5181 Encounter for therapeutic drug level monitoring: Secondary | ICD-10-CM

## 2011-06-20 DIAGNOSIS — Z952 Presence of prosthetic heart valve: Secondary | ICD-10-CM

## 2011-06-20 LAB — POCT INR: INR: 2.7

## 2011-06-20 NOTE — Patient Instructions (Signed)
  Latest dosing instructions   Total Sun Mon Tue Wed Thu Fri Sat   35 5 mg 5 mg 5 mg 5 mg 5 mg 5 mg 5 mg    (5 mg1) (5 mg1) (5 mg1) (5 mg1) (5 mg1) (5 mg1) (5 mg1)        

## 2011-07-18 ENCOUNTER — Ambulatory Visit (INDEPENDENT_AMBULATORY_CARE_PROVIDER_SITE_OTHER): Payer: BC Managed Care – PPO | Admitting: Internal Medicine

## 2011-07-18 DIAGNOSIS — Z954 Presence of other heart-valve replacement: Secondary | ICD-10-CM

## 2011-07-18 DIAGNOSIS — Z952 Presence of prosthetic heart valve: Secondary | ICD-10-CM

## 2011-07-18 LAB — POCT INR: INR: 2.8

## 2011-07-18 NOTE — Patient Instructions (Signed)
  Latest dosing instructions   Total Sun Mon Tue Wed Thu Fri Sat   35 5 mg 5 mg 5 mg 5 mg 5 mg 5 mg 5 mg    (5 mg1) (5 mg1) (5 mg1) (5 mg1) (5 mg1) (5 mg1) (5 mg1)        

## 2011-08-01 DIAGNOSIS — I071 Rheumatic tricuspid insufficiency: Secondary | ICD-10-CM | POA: Insufficient documentation

## 2011-08-09 DIAGNOSIS — C8190 Hodgkin lymphoma, unspecified, unspecified site: Secondary | ICD-10-CM | POA: Insufficient documentation

## 2011-08-09 DIAGNOSIS — C50919 Malignant neoplasm of unspecified site of unspecified female breast: Secondary | ICD-10-CM | POA: Insufficient documentation

## 2011-08-15 ENCOUNTER — Ambulatory Visit (INDEPENDENT_AMBULATORY_CARE_PROVIDER_SITE_OTHER): Payer: BC Managed Care – PPO | Admitting: Internal Medicine

## 2011-08-15 DIAGNOSIS — Z952 Presence of prosthetic heart valve: Secondary | ICD-10-CM

## 2011-08-15 DIAGNOSIS — Z954 Presence of other heart-valve replacement: Secondary | ICD-10-CM

## 2011-08-15 LAB — POCT INR: INR: 2.1

## 2011-08-15 NOTE — Patient Instructions (Signed)
  Latest dosing instructions   Total Sun Mon Tue Wed Thu Fri Sat   35 5 mg 5 mg 5 mg 5 mg 5 mg 5 mg 5 mg    (5 mg1) (5 mg1) (5 mg1) (5 mg1) (5 mg1) (5 mg1) (5 mg1)        

## 2011-09-05 ENCOUNTER — Ambulatory Visit (INDEPENDENT_AMBULATORY_CARE_PROVIDER_SITE_OTHER): Payer: BC Managed Care – PPO | Admitting: Internal Medicine

## 2011-09-05 DIAGNOSIS — Z952 Presence of prosthetic heart valve: Secondary | ICD-10-CM

## 2011-09-05 DIAGNOSIS — Z954 Presence of other heart-valve replacement: Secondary | ICD-10-CM

## 2011-09-05 LAB — POCT INR: INR: 2.6

## 2011-09-05 NOTE — Patient Instructions (Signed)
  Latest dosing instructions   Total Sun Mon Tue Wed Thu Fri Sat   35 5 mg 5 mg 5 mg 5 mg 5 mg 5 mg 5 mg    (5 mg1) (5 mg1) (5 mg1) (5 mg1) (5 mg1) (5 mg1) (5 mg1)        

## 2011-10-02 ENCOUNTER — Other Ambulatory Visit: Payer: Self-pay | Admitting: Dermatology

## 2011-10-03 ENCOUNTER — Ambulatory Visit: Payer: BC Managed Care – PPO | Admitting: *Deleted

## 2011-10-03 DIAGNOSIS — Z7901 Long term (current) use of anticoagulants: Secondary | ICD-10-CM

## 2011-10-03 DIAGNOSIS — Z954 Presence of other heart-valve replacement: Secondary | ICD-10-CM

## 2011-10-03 DIAGNOSIS — Z952 Presence of prosthetic heart valve: Secondary | ICD-10-CM

## 2011-10-03 LAB — POCT INR: INR: 3.3

## 2011-10-03 NOTE — Patient Instructions (Signed)
  Latest dosing instructions   Total Sun Mon Tue Wed Thu Fri Sat   35 5 mg 5 mg 5 mg 5 mg 5 mg 5 mg 5 mg    (5 mg1) (5 mg1) (5 mg1) (5 mg1) (5 mg1) (5 mg1) (5 mg1)        

## 2011-10-18 ENCOUNTER — Other Ambulatory Visit: Payer: Self-pay | Admitting: Internal Medicine

## 2011-10-20 ENCOUNTER — Other Ambulatory Visit: Payer: Self-pay | Admitting: Internal Medicine

## 2011-11-05 ENCOUNTER — Ambulatory Visit (INDEPENDENT_AMBULATORY_CARE_PROVIDER_SITE_OTHER): Payer: BC Managed Care – PPO | Admitting: Family

## 2011-11-05 DIAGNOSIS — Z954 Presence of other heart-valve replacement: Secondary | ICD-10-CM

## 2011-11-05 DIAGNOSIS — Z952 Presence of prosthetic heart valve: Secondary | ICD-10-CM

## 2011-11-05 DIAGNOSIS — Z7901 Long term (current) use of anticoagulants: Secondary | ICD-10-CM

## 2011-11-05 LAB — POCT INR: INR: 2.5

## 2011-11-05 NOTE — Patient Instructions (Signed)
  Latest dosing instructions   Total Sun Mon Tue Wed Thu Fri Sat   35 5 mg 5 mg 5 mg 5 mg 5 mg 5 mg 5 mg    (5 mg1) (5 mg1) (5 mg1) (5 mg1) (5 mg1) (5 mg1) (5 mg1)      Continue with same dose. Recheck in 4 weeks.

## 2011-11-27 ENCOUNTER — Other Ambulatory Visit: Payer: Self-pay | Admitting: Internal Medicine

## 2011-12-03 ENCOUNTER — Ambulatory Visit (INDEPENDENT_AMBULATORY_CARE_PROVIDER_SITE_OTHER): Payer: BC Managed Care – PPO | Admitting: Family

## 2011-12-03 DIAGNOSIS — Z954 Presence of other heart-valve replacement: Secondary | ICD-10-CM

## 2011-12-03 DIAGNOSIS — Z7901 Long term (current) use of anticoagulants: Secondary | ICD-10-CM

## 2011-12-03 LAB — POCT INR: INR: 2.6

## 2011-12-03 NOTE — Patient Instructions (Addendum)
Same dose 5mg . Every day check in 4 weeks    Latest dosing instructions   Total Sun Mon Tue Wed Thu Fri Sat   35 5 mg 5 mg 5 mg 5 mg 5 mg 5 mg 5 mg    (5 mg1) (5 mg1) (5 mg1) (5 mg1) (5 mg1) (5 mg1) (5 mg1)

## 2011-12-31 ENCOUNTER — Ambulatory Visit (INDEPENDENT_AMBULATORY_CARE_PROVIDER_SITE_OTHER): Payer: BC Managed Care – PPO | Admitting: Family

## 2011-12-31 DIAGNOSIS — Z7901 Long term (current) use of anticoagulants: Secondary | ICD-10-CM

## 2011-12-31 DIAGNOSIS — Z954 Presence of other heart-valve replacement: Secondary | ICD-10-CM

## 2011-12-31 LAB — POCT INR: INR: 3.3

## 2011-12-31 NOTE — Patient Instructions (Addendum)
Same dose 5mg. Every day check in 6 weeks    Latest dosing instructions   Total Sun Mon Tue Wed Thu Fri Sat   35 5 mg 5 mg 5 mg 5 mg 5 mg 5 mg 5 mg    (5 mg1) (5 mg1) (5 mg1) (5 mg1) (5 mg1) (5 mg1) (5 mg1)        

## 2012-01-02 ENCOUNTER — Other Ambulatory Visit: Payer: Self-pay | Admitting: Dermatology

## 2012-01-05 ENCOUNTER — Other Ambulatory Visit: Payer: Self-pay | Admitting: Internal Medicine

## 2012-01-18 ENCOUNTER — Other Ambulatory Visit: Payer: Self-pay | Admitting: Internal Medicine

## 2012-01-23 ENCOUNTER — Encounter: Payer: Self-pay | Admitting: Internal Medicine

## 2012-02-11 ENCOUNTER — Ambulatory Visit (INDEPENDENT_AMBULATORY_CARE_PROVIDER_SITE_OTHER): Payer: BC Managed Care – PPO | Admitting: Family

## 2012-02-11 DIAGNOSIS — Z954 Presence of other heart-valve replacement: Secondary | ICD-10-CM

## 2012-02-11 DIAGNOSIS — Z952 Presence of prosthetic heart valve: Secondary | ICD-10-CM

## 2012-02-11 LAB — POCT INR: INR: 2.8

## 2012-02-11 NOTE — Patient Instructions (Addendum)
Same dose 5mg. Every day check in 6 weeks    Latest dosing instructions   Total Sun Mon Tue Wed Thu Fri Sat   35 5 mg 5 mg 5 mg 5 mg 5 mg 5 mg 5 mg    (5 mg1) (5 mg1) (5 mg1) (5 mg1) (5 mg1) (5 mg1) (5 mg1)        

## 2012-02-25 LAB — HM MAMMOGRAPHY

## 2012-02-25 LAB — HM PAP SMEAR

## 2012-03-24 ENCOUNTER — Encounter: Payer: Self-pay | Admitting: Internal Medicine

## 2012-03-24 ENCOUNTER — Ambulatory Visit (INDEPENDENT_AMBULATORY_CARE_PROVIDER_SITE_OTHER): Payer: BC Managed Care – PPO | Admitting: Internal Medicine

## 2012-03-24 ENCOUNTER — Ambulatory Visit (INDEPENDENT_AMBULATORY_CARE_PROVIDER_SITE_OTHER): Payer: BC Managed Care – PPO | Admitting: Family

## 2012-03-24 VITALS — BP 104/76 | HR 74 | Temp 98.0°F | Wt 132.0 lb

## 2012-03-24 DIAGNOSIS — D509 Iron deficiency anemia, unspecified: Secondary | ICD-10-CM

## 2012-03-24 DIAGNOSIS — E785 Hyperlipidemia, unspecified: Secondary | ICD-10-CM

## 2012-03-24 DIAGNOSIS — Z23 Encounter for immunization: Secondary | ICD-10-CM

## 2012-03-24 DIAGNOSIS — I509 Heart failure, unspecified: Secondary | ICD-10-CM

## 2012-03-24 DIAGNOSIS — Z952 Presence of prosthetic heart valve: Secondary | ICD-10-CM

## 2012-03-24 DIAGNOSIS — E039 Hypothyroidism, unspecified: Secondary | ICD-10-CM

## 2012-03-24 DIAGNOSIS — Z954 Presence of other heart-valve replacement: Secondary | ICD-10-CM

## 2012-03-24 LAB — CBC WITH DIFFERENTIAL/PLATELET
Basophils Absolute: 0.1 10*3/uL (ref 0.0–0.1)
Basophils Relative: 1.5 % (ref 0.0–3.0)
Eosinophils Absolute: 0.3 10*3/uL (ref 0.0–0.7)
Eosinophils Relative: 4.6 % (ref 0.0–5.0)
HCT: 37.3 % (ref 36.0–46.0)
Hemoglobin: 11.9 g/dL — ABNORMAL LOW (ref 12.0–15.0)
Lymphocytes Relative: 22.6 % (ref 12.0–46.0)
Lymphs Abs: 1.4 10*3/uL (ref 0.7–4.0)
MCHC: 31.9 g/dL (ref 30.0–36.0)
MCV: 92.4 fl (ref 78.0–100.0)
Monocytes Absolute: 1.1 10*3/uL — ABNORMAL HIGH (ref 0.1–1.0)
Monocytes Relative: 17 % — ABNORMAL HIGH (ref 3.0–12.0)
Neutro Abs: 3.4 10*3/uL (ref 1.4–7.7)
Neutrophils Relative %: 54.3 % (ref 43.0–77.0)
Platelets: 371 10*3/uL (ref 150.0–400.0)
RBC: 4.03 Mil/uL (ref 3.87–5.11)
RDW: 14.6 % (ref 11.5–14.6)
WBC: 6.2 10*3/uL (ref 4.5–10.5)

## 2012-03-24 LAB — LIPID PANEL
Cholesterol: 205 mg/dL — ABNORMAL HIGH (ref 0–200)
HDL: 54 mg/dL (ref >39.00)
Total CHOL/HDL Ratio: 4
Triglycerides: 125 mg/dL (ref 0.0–149.0)
VLDL: 25 mg/dL (ref 0.0–40.0)

## 2012-03-24 LAB — HEPATIC FUNCTION PANEL
ALT: 30 U/L (ref 0–35)
AST: 41 U/L — ABNORMAL HIGH (ref 0–37)
Albumin: 4.1 g/dL (ref 3.5–5.2)
Alkaline Phosphatase: 54 U/L (ref 39–117)
Bilirubin, Direct: 0 mg/dL (ref 0.0–0.3)
Total Bilirubin: 0.5 mg/dL (ref 0.3–1.2)
Total Protein: 7.7 g/dL (ref 6.0–8.3)

## 2012-03-24 LAB — BASIC METABOLIC PANEL
BUN: 22 mg/dL (ref 6–23)
CO2: 31 mEq/L (ref 19–32)
Calcium: 9.4 mg/dL (ref 8.4–10.5)
Chloride: 101 mEq/L (ref 96–112)
Creatinine, Ser: 1.4 mg/dL — ABNORMAL HIGH (ref 0.4–1.2)
GFR: 41.92 mL/min — ABNORMAL LOW (ref >60.00)
Glucose, Bld: 83 mg/dL (ref 70–99)
Potassium: 3.9 mEq/L (ref 3.5–5.1)
Sodium: 138 mEq/L (ref 135–145)

## 2012-03-24 LAB — TSH: TSH: 1.06 u[IU]/mL (ref 0.35–5.50)

## 2012-03-24 LAB — LDL CHOLESTEROL, DIRECT: Direct LDL: 129.4 mg/dL

## 2012-03-24 LAB — POCT INR: INR: 3

## 2012-03-24 NOTE — Patient Instructions (Signed)
Same dose 5mg . Every day check in 6 weeks    Latest dosing instructions   Total Sun Mon Tue Wed Thu Fri Sat   35 5 mg 5 mg 5 mg 5 mg 5 mg 5 mg 5 mg    (5 mg1) (5 mg1) (5 mg1) (5 mg1) (5 mg1) (5 mg1) (5 mg1)

## 2012-03-24 NOTE — Assessment & Plan Note (Signed)
Clinically doing well Continue same meds 

## 2012-03-24 NOTE — Progress Notes (Signed)
Patient ID: Grace [name], female   DOB: 1951-01-04, 61 y.o.   MRN: [id number] CHF-- clinically stable-- related to valvulopathy from radiation-- s/p aortic valve replacement-- reviewed some records from [facility]  Lipids-- intolerant to statins  Thyroid-- needs f/u  Past Medical History  Diagnosis Date  . Anemia   . Cancer     breast  . Hypothyroidism   . GI bleed   . Migraine   . Hodgkin's disease(201)   . WPW (Wolff-Parkinson-White syndrome)   . Aortic insufficiency   . Hyperlipidemia   . CHF (congestive heart failure)     History   Social History  . Marital Status: Married    Spouse Name: N/A    Number of Children: N/A  . Years of Education: N/A   Occupational History  . Not on file.   Social History Main Topics  . Smoking status: Never Smoker   . Smokeless tobacco: Not on file  . Alcohol Use: No  . Drug Use: No  . Sexually Active:    Other Topics Concern  . Not on file   Social History Narrative  . No narrative on file    Past Surgical History  Procedure Date  . Endometrial ablation   . Aortic valve surgery   . Mastectomy   . Mitral valve replacement   . Splenectomy   . Thoracotomy   . Valvulopathy 06/13/06  . Pericardial stripping 9/08  . Tonsillectomy   . Bilateral breast implants     Family History  Problem Relation Age of Onset  . Heart disease Mother   . Cancer Mother     breast  . Heart disease Maternal Aunt   . Cancer Maternal Aunt     uterine  . Heart disease Maternal Grandmother   . Diabetes Maternal Grandmother     Allergies  Allergen Reactions  . Statins     myalgia    Current Outpatient Prescriptions on File Prior to Visit  Medication Sig Dispense Refill  . aspirin 81 MG tablet Take 81 mg by mouth daily.        . Biotin 5 MG TABS Take 1 tablet by mouth daily.        . Calcium Carbonate-Vitamin D (CALTRATE 600+D) 600-400 MG-UNIT per tablet Take 1 tablet by mouth daily.        Marland Kitchen docusate sodium (COLACE) 100 MG capsule Take  100 mg by mouth 2 (two) times daily.        . furosemide (LASIX) 40 MG tablet TAKE 1 TABLET DAILY  90 tablet  1  . KLOR-CON M20 20 MEQ tablet TAKE 1 TABLET DAILY  90 tablet  1  . levothyroxine (SYNTHROID, LEVOTHROID) 125 MCG tablet TAKE 1 TABLET ONCE DAILY  90 tablet  1  . metoprolol (TOPROL-XL) 50 MG 24 hr tablet Take 50 mg by mouth daily.        . Multiple Vitamin (MULTIVITAMIN) tablet Take 1 tablet by mouth daily.        Marland Kitchen spironolactone (ALDACTONE) 25 MG tablet Take 25 mg by mouth daily.        Marland Kitchen warfarin (COUMADIN) 5 MG tablet TAKE 1 TABLET DAILY  90 tablet  1     patient denies chest pain, shortness of breath, orthopnea. Denies lower extremity edema, abdominal pain, change in appetite, change in bowel movements. Patient denies rashes, musculoskeletal complaints. No other specific complaints in a complete review of systems.   BP 104/76  Pulse 74  Temp 98 F (  36.7 C) (Oral)  Wt 132 lb (59.875 kg)  Well-developed well-nourished female in no acute distress. HEENT exam atraumatic, normocephalic, extraocular muscles are intact. Neck is supple. No jugular venous distention no thyromegaly. Chest clear to auscultation without increased work of breathing. Cardiac exam S1 and S2 are regular, mechanical heart sounds. Abdominal exam active bowel sounds, soft, nontender. Extremities no edema. Neurologic exam she is alert without any motor sensory deficits. Gait is normal.

## 2012-03-24 NOTE — Assessment & Plan Note (Signed)
Not a statin candidate.  

## 2012-03-24 NOTE — Assessment & Plan Note (Signed)
Will continue to follow. 

## 2012-03-24 NOTE — Assessment & Plan Note (Signed)
Check labs today.

## 2012-05-05 ENCOUNTER — Ambulatory Visit (INDEPENDENT_AMBULATORY_CARE_PROVIDER_SITE_OTHER): Payer: BC Managed Care – PPO | Admitting: Family

## 2012-05-05 DIAGNOSIS — Z954 Presence of other heart-valve replacement: Secondary | ICD-10-CM

## 2012-05-05 DIAGNOSIS — Z7901 Long term (current) use of anticoagulants: Secondary | ICD-10-CM

## 2012-05-05 LAB — POCT INR: INR: 4.1

## 2012-05-05 NOTE — Patient Instructions (Addendum)
Eat a few extra servings of greens. Hold dose today. Then continue same dose 5mg . Every day check in 3 weeks.    Latest dosing instructions   Total Sun Mon Tue Wed Thu Fri Sat   35 5 mg 5 mg 5 mg 5 mg 5 mg 5 mg 5 mg    (5 mg1) (5 mg1) (5 mg1) (5 mg1) (5 mg1) (5 mg1) (5 mg1)

## 2012-05-25 ENCOUNTER — Other Ambulatory Visit: Payer: Self-pay | Admitting: Internal Medicine

## 2012-05-28 ENCOUNTER — Ambulatory Visit (INDEPENDENT_AMBULATORY_CARE_PROVIDER_SITE_OTHER): Payer: BC Managed Care – PPO | Admitting: Family

## 2012-05-28 DIAGNOSIS — Z952 Presence of prosthetic heart valve: Secondary | ICD-10-CM

## 2012-05-28 DIAGNOSIS — Z954 Presence of other heart-valve replacement: Secondary | ICD-10-CM

## 2012-05-28 LAB — POCT INR: INR: 2.5

## 2012-05-28 NOTE — Patient Instructions (Addendum)
Continue same dose 5mg. Every day check in 4 weeks.    Latest dosing instructions   Total Sun Mon Tue Wed Thu Fri Sat   35 5 mg 5 mg 5 mg 5 mg 5 mg 5 mg 5 mg    (5 mg1) (5 mg1) (5 mg1) (5 mg1) (5 mg1) (5 mg1) (5 mg1)        

## 2012-06-21 ENCOUNTER — Other Ambulatory Visit: Payer: Self-pay | Admitting: Internal Medicine

## 2012-06-25 ENCOUNTER — Ambulatory Visit (INDEPENDENT_AMBULATORY_CARE_PROVIDER_SITE_OTHER): Payer: BC Managed Care – PPO | Admitting: Family

## 2012-06-25 ENCOUNTER — Other Ambulatory Visit: Payer: Self-pay | Admitting: Internal Medicine

## 2012-06-25 DIAGNOSIS — Z954 Presence of other heart-valve replacement: Secondary | ICD-10-CM

## 2012-06-25 DIAGNOSIS — Z952 Presence of prosthetic heart valve: Secondary | ICD-10-CM

## 2012-06-25 LAB — POCT INR: INR: 2.8

## 2012-06-25 NOTE — Patient Instructions (Addendum)
Continue same dose 5mg . Every day check in 4 weeks.    Latest dosing instructions   Total Sun Mon Tue Wed Thu Fri Sat   35 5 mg 5 mg 5 mg 5 mg 5 mg 5 mg 5 mg    (5 mg1) (5 mg1) (5 mg1) (5 mg1) (5 mg1) (5 mg1) (5 mg1)

## 2012-07-23 ENCOUNTER — Ambulatory Visit (INDEPENDENT_AMBULATORY_CARE_PROVIDER_SITE_OTHER): Payer: BC Managed Care – PPO | Admitting: Family

## 2012-07-23 DIAGNOSIS — Z954 Presence of other heart-valve replacement: Secondary | ICD-10-CM

## 2012-07-23 DIAGNOSIS — Z7901 Long term (current) use of anticoagulants: Secondary | ICD-10-CM

## 2012-07-23 LAB — POCT INR: INR: 3

## 2012-07-23 NOTE — Patient Instructions (Addendum)
Continue same dose 5mg . Every day check in 6 weeks.  Anticoagulation Dose Instructions as of 07/23/2012     Glynis Smiles Tue Wed Thu Fri Sat   New Dose 5 mg 5 mg 5 mg 5 mg 5 mg 5 mg 5 mg    Description       Continue same dose 5mg . Every day check in 6 weeks.

## 2012-09-03 ENCOUNTER — Ambulatory Visit (INDEPENDENT_AMBULATORY_CARE_PROVIDER_SITE_OTHER): Payer: BC Managed Care – PPO | Admitting: Family

## 2012-09-03 DIAGNOSIS — Z952 Presence of prosthetic heart valve: Secondary | ICD-10-CM

## 2012-09-03 DIAGNOSIS — Z954 Presence of other heart-valve replacement: Secondary | ICD-10-CM

## 2012-09-03 LAB — POCT INR: INR: 2.8

## 2012-09-03 NOTE — Patient Instructions (Addendum)
Continue same dose 5mg . Every day check in 6 weeks.  Anticoagulation Dose Instructions as of 09/03/2012     Grace [name] Tue Wed Thu Fri Sat   New Dose 5 mg 5 mg 5 mg 5 mg 5 mg 5 mg 5 mg    Description       Continue same dose 5mg . Every day check in 6 weeks.

## 2012-10-13 ENCOUNTER — Encounter: Payer: Self-pay | Admitting: Internal Medicine

## 2012-10-13 ENCOUNTER — Ambulatory Visit (INDEPENDENT_AMBULATORY_CARE_PROVIDER_SITE_OTHER): Payer: BC Managed Care – PPO | Admitting: Family

## 2012-10-13 DIAGNOSIS — Z954 Presence of other heart-valve replacement: Secondary | ICD-10-CM

## 2012-10-13 DIAGNOSIS — Z952 Presence of prosthetic heart valve: Secondary | ICD-10-CM

## 2012-10-13 LAB — POCT INR: INR: 2.2

## 2012-10-13 NOTE — Patient Instructions (Addendum)
Continue same dose 5mg . Every day check in 6 weeks.  Anticoagulation Dose Instructions as of 10/13/2012     Grace [name] Tue Wed Thu Fri Sat   New Dose 5 mg 5 mg 5 mg 5 mg 5 mg 5 mg 5 mg    Description       Continue same dose 5mg . Every day check in 6 weeks.

## 2012-11-24 ENCOUNTER — Encounter: Payer: BC Managed Care – PPO | Admitting: Family

## 2012-11-25 ENCOUNTER — Ambulatory Visit (INDEPENDENT_AMBULATORY_CARE_PROVIDER_SITE_OTHER): Payer: 59 | Admitting: Family

## 2012-11-25 DIAGNOSIS — Z7901 Long term (current) use of anticoagulants: Secondary | ICD-10-CM

## 2012-11-25 DIAGNOSIS — Z8679 Personal history of other diseases of the circulatory system: Secondary | ICD-10-CM

## 2012-11-25 DIAGNOSIS — Z952 Presence of prosthetic heart valve: Secondary | ICD-10-CM

## 2012-11-25 DIAGNOSIS — Z954 Presence of other heart-valve replacement: Secondary | ICD-10-CM

## 2012-11-25 LAB — POCT INR: INR: 2.2

## 2012-11-25 NOTE — Patient Instructions (Addendum)
Take an extra 1/2 tab today.Marland KitchenMarland KitchenAnd every Wednesday. All other days continue same dose 5mg . Every day check in 4 weeks.  Anticoagulation Dose Instructions as of 11/25/2012     Glynis Smiles Tue Wed Thu Fri Sat   New Dose 5 mg 5 mg 5 mg 7.5 mg 5 mg 5 mg 5 mg    Description       Take an extra 1/2 tab today.Marland KitchenMarland KitchenAnd every Wednesday. All other days continue same dose 5mg . Every day check in 4 weeks.

## 2012-11-26 ENCOUNTER — Encounter: Payer: BC Managed Care – PPO | Admitting: Family

## 2012-12-22 ENCOUNTER — Encounter: Payer: Self-pay | Admitting: Internal Medicine

## 2012-12-23 ENCOUNTER — Ambulatory Visit (INDEPENDENT_AMBULATORY_CARE_PROVIDER_SITE_OTHER): Payer: 59 | Admitting: General Practice

## 2012-12-23 DIAGNOSIS — Z954 Presence of other heart-valve replacement: Secondary | ICD-10-CM | POA: Insufficient documentation

## 2012-12-23 DIAGNOSIS — Z7901 Long term (current) use of anticoagulants: Secondary | ICD-10-CM

## 2012-12-23 DIAGNOSIS — Z8679 Personal history of other diseases of the circulatory system: Secondary | ICD-10-CM

## 2012-12-23 DIAGNOSIS — Z952 Presence of prosthetic heart valve: Secondary | ICD-10-CM

## 2012-12-23 LAB — POCT INR: INR: 3.4

## 2013-01-07 ENCOUNTER — Ambulatory Visit (INDEPENDENT_AMBULATORY_CARE_PROVIDER_SITE_OTHER): Payer: 59 | Admitting: Physician Assistant

## 2013-01-07 ENCOUNTER — Encounter: Payer: Self-pay | Admitting: Physician Assistant

## 2013-01-07 VITALS — BP 110/70 | HR 66 | Ht 66.0 in | Wt 128.0 lb

## 2013-01-07 DIAGNOSIS — Z7901 Long term (current) use of anticoagulants: Secondary | ICD-10-CM

## 2013-01-07 DIAGNOSIS — Z952 Presence of prosthetic heart valve: Secondary | ICD-10-CM | POA: Insufficient documentation

## 2013-01-07 DIAGNOSIS — Z1211 Encounter for screening for malignant neoplasm of colon: Secondary | ICD-10-CM

## 2013-01-07 DIAGNOSIS — Z954 Presence of other heart-valve replacement: Secondary | ICD-10-CM

## 2013-01-07 NOTE — Patient Instructions (Addendum)
You will be getting a call from [facility] regarding the Virtual colonoscopy which is a Cat scan.   I am faxing them the requested date of 02-01-2013 .   They will be calling you with the information on how to prep for this scan.    Their number is [phone]. Ask for Alvino Chapel.

## 2013-01-07 NOTE — Progress Notes (Signed)
Subjective:    Patient ID: Grace [name], female    DOB: 16-Jun-1950, 62 y.o.   MRN: [id number]  HPI Grace [name] is a very nice 62 year old white female known remotely to Dr. Lina Sar from prior screening colonoscopy done in September of 2004. This was a normal exam. She comes back in today to discuss colon screening. She is currently asymptomatic. She states she has occasional mild constipation but other than that no GI complaints . She has a complicated past medical history and is on chronic Coumadin now status post aortic and mitral valve replacements. She is felt to have radiation-induced valvular disease with remote history of Hodgkin's disease in the 1970s for which she underwent chemotherapy radiation and surgery with thoracotomy and splenectomy She also has history of Wolff-Parkinson-White. Her family history is negative for colon cancer. She comments that her mother was just diagnosed with a colon polyp at age 69.    Review of Systems  Constitutional: Negative.   HENT: Negative.   Eyes: Negative.   Respiratory: Negative.   Gastrointestinal: Positive for constipation.  Endocrine: Negative.   Genitourinary: Negative.   Musculoskeletal: Negative.   Skin: Negative.   Allergic/Immunologic: Negative.   Neurological: Negative.   Hematological: Negative.   Psychiatric/Behavioral: Negative.    Outpatient Prescriptions Prior to Visit  Medication Sig Dispense Refill  . aspirin 81 MG tablet Take 81 mg by mouth daily.        . Biotin 5 MG TABS Take 1 tablet by mouth daily.        . Calcium Carbonate-Vitamin D (CALTRATE 600+D) 600-400 MG-UNIT per tablet Take 2 tablets by mouth daily.       Marland Kitchen docusate sodium (COLACE) 100 MG capsule Take 100 mg by mouth daily.       . furosemide (LASIX) 40 MG tablet TAKE 1 TABLET DAILY  90 tablet  0  . KLOR-CON M20 20 MEQ tablet TAKE 1 TABLET DAILY  90 tablet  0  . levothyroxine (SYNTHROID, LEVOTHROID) 125 MCG tablet TAKE 1 TABLET ONCE DAILY  90 tablet   0  . Magnesium 250 MG TABS Take 1 tablet by mouth daily.      . metoprolol (TOPROL-XL) 50 MG 24 hr tablet Take 50 mg by mouth 2 (two) times daily.       . Multiple Vitamin (MULTIVITAMIN) tablet Take 1 tablet by mouth daily.        Marland Kitchen spironolactone (ALDACTONE) 25 MG tablet Take 50 mg by mouth daily.       Marland Kitchen warfarin (COUMADIN) 5 MG tablet TAKE 1 TABLET DAILY  90 tablet  0   No facility-administered medications prior to visit.   Allergies  Allergen Reactions  . Statins     myalgia   Patient Active Problem List   Diagnosis Date Noted  . H/O aortic valve replacement 01/07/2013  . Long term (current) use of anticoagulants 12/23/2012  . Heart valve replaced by other means 12/23/2012  . Mitral valve replaced 12/23/2012  . ANEMIA, IRON DEFICIENCY 08/16/2009  . WOLFF (WOLFE)-PARKINSON-WHITE (WPW) SYNDROME 08/16/2009  . CONGESTIVE HEART FAILURE 08/03/2009  . HYPERLIPIDEMIA 08/29/2008  . TRANSIENT ISCHEMIC ATTACKS, HX OF 04/27/2007  . HYPOTHYROIDISM 11/03/2006  . COMMON MIGRAINE 11/03/2006  . BREAST CANCER, HX OF 11/03/2006  . HX, PERSONAL, HODGKIN'S DISEASE 11/03/2006  . GASTROINTESTINAL HEMORRHAGE, HX OF 11/03/2006   family history includes Cancer in her maternal aunt and mother; Diabetes in her maternal grandmother; Heart disease in her maternal aunt, maternal grandmother, and mother. History  Substance Use Topics  . Smoking status: Never Smoker   . Smokeless tobacco: Never Used  . Alcohol Use: No       Objective:   Physical Exam  well-developed thin white female in no acute distress, pleasant blood pressure 110/70 pulse 66 height 5 foot 6 weight 128. HEENT; nontraumatic normocephalic EOMI PERRLA sclera anicteric, Supple; no JVD, Cardiovascular; regular rate and rhythm with S1-S2 she has mechanical valve sounds audible, Abdomen; soft nondistended nontender no palpable mass or hepatosplenomegaly bowel sounds are active, Rectal; exam not done, Extremities; no clubbing cyanosis or edema  skin warm and dry, Psych; mood and affect normal and appropriate        Assessment & Plan:  #47 62 year old white female presenting for colon neoplasia surveillance. Patient is asymptomatic, had a normal colonoscopy in 2004 and has no family history of colon cancer. #2 chronic anticoagulation with Coumadin #3 status post aortic and mitral valve replacements/mechanical with history of radiation-induced valvular disease #4 history of Hodgkin's lymphoma status post remote chemotherapy/radiation,/thoracotomy and splenectomy 1970s #5 Wolff-Parkinson-White syndrome #6 congestive heart failure  Plan; option of colonoscopy versus virtual colonoscopy was discussed with the pt. She is a good candidate for virtual colonoscopy given that she would have to come off of Coumadin and be bridged with  Lovenox, and she  also is at higher risk for complications with sedation with history of Wolff-Parkinson-White. Patient is in favor of proceeding with virtual colonoscopy. She is aware that if polyps are found on the virtual colonoscopy then we may need to proceed with standard colonoscopy the Virtual colonoscopy will be scheduled today.

## 2013-01-07 NOTE — Progress Notes (Signed)
Reviewed, and agree except that virtual colon will not show avm's which may explain mild anemia.

## 2013-01-13 ENCOUNTER — Other Ambulatory Visit: Payer: Self-pay | Admitting: Internal Medicine

## 2013-01-27 ENCOUNTER — Ambulatory Visit
Admission: RE | Admit: 2013-01-27 | Discharge: 2013-01-27 | Disposition: A | Discharge status: Home / Self Care | Payer: 59 | Source: Ambulatory Visit | Attending: Physician Assistant | Admitting: Physician Assistant

## 2013-01-27 DIAGNOSIS — Z1211 Encounter for screening for malignant neoplasm of colon: Secondary | ICD-10-CM

## 2013-01-27 DIAGNOSIS — Z7901 Long term (current) use of anticoagulants: Secondary | ICD-10-CM

## 2013-02-03 ENCOUNTER — Ambulatory Visit (INDEPENDENT_AMBULATORY_CARE_PROVIDER_SITE_OTHER): Payer: 59 | Admitting: General Practice

## 2013-02-03 DIAGNOSIS — Z7901 Long term (current) use of anticoagulants: Secondary | ICD-10-CM

## 2013-02-03 DIAGNOSIS — Z8679 Personal history of other diseases of the circulatory system: Secondary | ICD-10-CM

## 2013-02-03 DIAGNOSIS — Z952 Presence of prosthetic heart valve: Secondary | ICD-10-CM

## 2013-02-03 DIAGNOSIS — Z954 Presence of other heart-valve replacement: Secondary | ICD-10-CM

## 2013-02-03 LAB — POCT INR: INR: 5

## 2013-02-09 ENCOUNTER — Ambulatory Visit (INDEPENDENT_AMBULATORY_CARE_PROVIDER_SITE_OTHER): Payer: 59 | Admitting: General Practice

## 2013-02-09 ENCOUNTER — Other Ambulatory Visit: Payer: Self-pay | Admitting: General Practice

## 2013-02-09 DIAGNOSIS — Z8679 Personal history of other diseases of the circulatory system: Secondary | ICD-10-CM

## 2013-02-09 DIAGNOSIS — Z954 Presence of other heart-valve replacement: Secondary | ICD-10-CM

## 2013-02-09 DIAGNOSIS — Z952 Presence of prosthetic heart valve: Secondary | ICD-10-CM

## 2013-02-09 DIAGNOSIS — Z7901 Long term (current) use of anticoagulants: Secondary | ICD-10-CM

## 2013-02-09 LAB — POCT INR: INR: 1.7

## 2013-02-09 MED ORDER — WARFARIN SODIUM 1 MG PO TABS: ORAL_TABLET | ORAL | Status: DC

## 2013-02-11 LAB — HM PAP SMEAR: HM Pap smear: NORMAL

## 2013-02-16 ENCOUNTER — Other Ambulatory Visit: Payer: Self-pay

## 2013-02-16 MED ORDER — WARFARIN SODIUM 1 MG PO TABS: ORAL_TABLET | ORAL | Status: DC

## 2013-03-09 ENCOUNTER — Ambulatory Visit (INDEPENDENT_AMBULATORY_CARE_PROVIDER_SITE_OTHER): Payer: 59 | Admitting: General Practice

## 2013-03-09 DIAGNOSIS — Z8679 Personal history of other diseases of the circulatory system: Secondary | ICD-10-CM

## 2013-03-09 DIAGNOSIS — Z7901 Long term (current) use of anticoagulants: Secondary | ICD-10-CM

## 2013-03-09 DIAGNOSIS — Z954 Presence of other heart-valve replacement: Secondary | ICD-10-CM

## 2013-03-09 DIAGNOSIS — Z952 Presence of prosthetic heart valve: Secondary | ICD-10-CM

## 2013-03-09 LAB — POCT INR: INR: 2.8

## 2013-03-26 ENCOUNTER — Encounter: Payer: Self-pay | Admitting: Internal Medicine

## 2013-03-26 ENCOUNTER — Ambulatory Visit (INDEPENDENT_AMBULATORY_CARE_PROVIDER_SITE_OTHER): Payer: 59 | Admitting: Internal Medicine

## 2013-03-26 VITALS — BP 124/86 | HR 80 | Temp 98.1°F | Ht 66.0 in | Wt 127.0 lb

## 2013-03-26 DIAGNOSIS — I509 Heart failure, unspecified: Secondary | ICD-10-CM

## 2013-03-26 DIAGNOSIS — Z23 Encounter for immunization: Secondary | ICD-10-CM

## 2013-03-26 DIAGNOSIS — E785 Hyperlipidemia, unspecified: Secondary | ICD-10-CM

## 2013-03-26 DIAGNOSIS — E039 Hypothyroidism, unspecified: Secondary | ICD-10-CM

## 2013-03-26 DIAGNOSIS — D509 Iron deficiency anemia, unspecified: Secondary | ICD-10-CM

## 2013-03-26 LAB — LIPID PANEL
Cholesterol: 216 mg/dL — ABNORMAL HIGH (ref 0–200)
HDL: 58.4 mg/dL (ref >39.00)
Total CHOL/HDL Ratio: 4
Triglycerides: 125 mg/dL (ref 0.0–149.0)
VLDL: 25 mg/dL (ref 0.0–40.0)

## 2013-03-26 LAB — CBC WITH DIFFERENTIAL/PLATELET
Basophils Absolute: 0.1 10*3/uL (ref 0.0–0.1)
Basophils Relative: 0.8 % (ref 0.0–3.0)
Eosinophils Absolute: 0.3 10*3/uL (ref 0.0–0.7)
Eosinophils Relative: 4.3 % (ref 0.0–5.0)
HCT: 37 % (ref 36.0–46.0)
Hemoglobin: 12.2 g/dL (ref 12.0–15.0)
Lymphocytes Relative: 21.8 % (ref 12.0–46.0)
Lymphs Abs: 1.6 10*3/uL (ref 0.7–4.0)
MCHC: 33 g/dL (ref 30.0–36.0)
MCV: 91.4 fl (ref 78.0–100.0)
Monocytes Absolute: 1.2 10*3/uL — ABNORMAL HIGH (ref 0.1–1.0)
Monocytes Relative: 16.1 % — ABNORMAL HIGH (ref 3.0–12.0)
Neutro Abs: 4.2 10*3/uL (ref 1.4–7.7)
Neutrophils Relative %: 57 % (ref 43.0–77.0)
Platelets: 378 10*3/uL (ref 150.0–400.0)
RBC: 4.05 Mil/uL (ref 3.87–5.11)
RDW: 14.3 % (ref 11.5–14.6)
WBC: 7.3 10*3/uL (ref 4.5–10.5)

## 2013-03-26 LAB — HEPATIC FUNCTION PANEL
ALT: 27 U/L (ref 0–35)
AST: 37 U/L (ref 0–37)
Albumin: 4.4 g/dL (ref 3.5–5.2)
Alkaline Phosphatase: 50 U/L (ref 39–117)
Bilirubin, Direct: 0 mg/dL (ref 0.0–0.3)
Total Bilirubin: 0.6 mg/dL (ref 0.3–1.2)
Total Protein: 8 g/dL (ref 6.0–8.3)

## 2013-03-26 LAB — BASIC METABOLIC PANEL
BUN: 27 mg/dL — ABNORMAL HIGH (ref 6–23)
CO2: 29 mEq/L (ref 19–32)
Calcium: 9.8 mg/dL (ref 8.4–10.5)
Chloride: 102 mEq/L (ref 96–112)
Creatinine, Ser: 1.4 mg/dL — ABNORMAL HIGH (ref 0.4–1.2)
GFR: 39.43 mL/min — ABNORMAL LOW (ref >60.00)
Glucose, Bld: 89 mg/dL (ref 70–99)
Potassium: 4.9 mEq/L (ref 3.5–5.1)
Sodium: 138 mEq/L (ref 135–145)

## 2013-03-26 LAB — LDL CHOLESTEROL, DIRECT: Direct LDL: 140 mg/dL

## 2013-03-26 LAB — TSH: TSH: 1.15 u[IU]/mL (ref 0.35–5.50)

## 2013-03-26 MED ORDER — IBANDRONATE SODIUM 150 MG PO TABS: 150.0000 mg | ORAL_TABLET | ORAL | Status: DC

## 2013-03-29 NOTE — Assessment & Plan Note (Signed)
Lipid Panel     Component Value Date/Time   CHOL 216* 03/26/2013 0916   TRIG 125.0 03/26/2013 0916   HDL 58.40 03/26/2013 0916   CHOLHDL 4 03/26/2013 0916   VLDL 25.0 03/26/2013 0916   LDLCALC 90 04/24/2011 0919    Currently no meds

## 2013-03-29 NOTE — Assessment & Plan Note (Signed)
Check labs 

## 2013-03-29 NOTE — Assessment & Plan Note (Signed)
No sxs Continue same meds 

## 2013-03-29 NOTE — Progress Notes (Signed)
Feels well, no complaints  Hx Aoritc valve replacement- no sxs Hx CHF- no sxs  Lipids- needs f/u  Hypothyroid- needs f/u  Past Medical History  Diagnosis Date  . Anemia   . Cancer     breast  . Hypothyroidism   . GI bleed   . Migraine   . Hodgkin's disease   . WPW (Wolff-Parkinson-White syndrome)   . Aortic insufficiency   . Hyperlipidemia   . CHF (congestive heart failure)   . AORTIC VALVE REPLACEMENT, HX OF 04/27/2007    Qualifier: Diagnosis of  By: Cato Mulligan MD, Brittay Mogle    . MITRAL VALVE REPLACEMENT, HX OF 04/27/2007    Qualifier: Diagnosis of  By: Cato Mulligan MD, Krue Peterka      History   Social History  . Marital Status: Married    Spouse Name: N/A    Number of Children: N/A  . Years of Education: N/A   Occupational History  . Not on file.   Social History Main Topics  . Smoking status: Never Smoker   . Smokeless tobacco: Never Used  . Alcohol Use: No  . Drug Use: No  . Sexual Activity: Not on file   Other Topics Concern  . Not on file   Social History Narrative  . No narrative on file    Past Surgical History  Procedure Laterality Date  . Endometrial ablation    . Aortic valve surgery    . Mastectomy    . Mitral valve replacement    . Splenectomy    . Thoracotomy    . Valvulopathy  06/13/06  . Pericardial stripping  9/08  . Tonsillectomy    . Bilateral breast implants      Family History  Problem Relation Age of Onset  . Heart disease Mother   . Cancer Mother     breast  . Heart disease Maternal Aunt   . Cancer Maternal Aunt     uterine  . Heart disease Maternal Grandmother   . Diabetes Maternal Grandmother     Allergies  Allergen Reactions  . Statins     myalgia    Current Outpatient Prescriptions on File Prior to Visit  Medication Sig Dispense Refill  . aspirin 81 MG tablet Take 81 mg by mouth daily.        . Biotin 5 MG TABS Take 1 tablet by mouth daily.        . Calcium Carbonate-Vitamin D (CALTRATE 600+D) 600-400 MG-UNIT per tablet  Take 2 tablets by mouth daily.       Marland Kitchen docusate sodium (COLACE) 100 MG capsule Take 100 mg by mouth daily.       . furosemide (LASIX) 40 MG tablet TAKE 1 TABLET DAILY  90 tablet  1  . KLOR-CON M20 20 MEQ tablet TAKE 1 TABLET DAILY  90 tablet  1  . levothyroxine (SYNTHROID, LEVOTHROID) 125 MCG tablet TAKE 1 TABLET DAILY  90 tablet  1  . Magnesium 250 MG TABS Take 1 tablet by mouth daily.      . metoprolol (TOPROL-XL) 50 MG 24 hr tablet Take 50 mg by mouth 2 (two) times daily.       . Multiple Vitamin (MULTIVITAMIN) tablet Take 1 tablet by mouth daily.        Marland Kitchen spironolactone (ALDACTONE) 25 MG tablet Take 50 mg by mouth daily.        No current facility-administered medications on file prior to visit.     patient denies chest pain, shortness of  breath, orthopnea. Denies lower extremity edema, abdominal pain, change in appetite, change in bowel movements. Patient denies rashes, musculoskeletal complaints. No other specific complaints in a complete review of systems.   BP 124/86  Pulse 80  Temp(Src) 98.1 F (36.7 C) (Oral)  Ht 5\' 6"  (1.676 m)  Wt 127 lb (57.607 kg)  BMI 20.51 kg/m2  Well-developed well-nourished female in no acute distress. HEENT exam atraumatic, normocephalic, extraocular muscles are intact. Neck is supple. No jugular venous distention no thyromegaly. Chest clear to auscultation without increased work of breathing. Cardiac exam S1 and S2 are regular. mechancial heart sounds Abdominal exam active bowel sounds, soft, nontender. Extremities no edema. Neurologic exam she is alert without any motor sensory deficits. Gait is normal.

## 2013-03-31 ENCOUNTER — Encounter: Payer: Self-pay | Admitting: Internal Medicine

## 2013-04-01 MED ORDER — IBANDRONATE SODIUM 150 MG PO TABS: 150.0000 mg | ORAL_TABLET | ORAL | Status: DC

## 2013-04-01 NOTE — Addendum Note (Signed)
Addended by: Azucena Freed on: 04/01/2013 10:18 AM   Modules accepted: Orders

## 2013-04-06 ENCOUNTER — Ambulatory Visit (INDEPENDENT_AMBULATORY_CARE_PROVIDER_SITE_OTHER): Payer: 59 | Admitting: General Practice

## 2013-04-06 DIAGNOSIS — Z954 Presence of other heart-valve replacement: Secondary | ICD-10-CM

## 2013-04-06 DIAGNOSIS — Z952 Presence of prosthetic heart valve: Secondary | ICD-10-CM

## 2013-04-06 DIAGNOSIS — Z7901 Long term (current) use of anticoagulants: Secondary | ICD-10-CM

## 2013-04-06 DIAGNOSIS — Z8679 Personal history of other diseases of the circulatory system: Secondary | ICD-10-CM

## 2013-04-06 LAB — POCT INR: INR: 2.2

## 2013-04-06 NOTE — Progress Notes (Signed)
Pre-visit discussion using our clinic review tool. No additional management support is needed unless otherwise documented below in the visit note.  

## 2013-05-05 ENCOUNTER — Ambulatory Visit (INDEPENDENT_AMBULATORY_CARE_PROVIDER_SITE_OTHER): Payer: 59 | Admitting: General Practice

## 2013-05-05 DIAGNOSIS — Z952 Presence of prosthetic heart valve: Secondary | ICD-10-CM

## 2013-05-05 DIAGNOSIS — Z954 Presence of other heart-valve replacement: Secondary | ICD-10-CM

## 2013-05-05 LAB — POCT INR: INR: 2.2

## 2013-05-05 NOTE — Progress Notes (Signed)
Pre-visit discussion using our clinic review tool. No additional management support is needed unless otherwise documented below in the visit note.  

## 2013-06-02 ENCOUNTER — Ambulatory Visit (INDEPENDENT_AMBULATORY_CARE_PROVIDER_SITE_OTHER): Payer: 59 | Admitting: General Practice

## 2013-06-02 ENCOUNTER — Other Ambulatory Visit: Payer: Self-pay | Admitting: General Practice

## 2013-06-02 ENCOUNTER — Telehealth: Payer: Self-pay | Admitting: Internal Medicine

## 2013-06-02 DIAGNOSIS — Z952 Presence of prosthetic heart valve: Secondary | ICD-10-CM

## 2013-06-02 DIAGNOSIS — Z954 Presence of other heart-valve replacement: Secondary | ICD-10-CM

## 2013-06-02 LAB — POCT INR: INR: 2.7

## 2013-06-02 MED ORDER — WARFARIN SODIUM 5 MG PO TABS: ORAL_TABLET | ORAL | Status: DC

## 2013-06-02 MED ORDER — POTASSIUM CHLORIDE CRYS ER 20 MEQ PO TBCR: EXTENDED_RELEASE_TABLET | ORAL | Status: DC

## 2013-06-02 MED ORDER — LEVOTHYROXINE SODIUM 125 MCG PO TABS: ORAL_TABLET | ORAL | Status: DC

## 2013-06-02 MED ORDER — IBANDRONATE SODIUM 150 MG PO TABS: 150.0000 mg | ORAL_TABLET | ORAL | Status: DC

## 2013-06-02 MED ORDER — FUROSEMIDE 40 MG PO TABS: ORAL_TABLET | ORAL | Status: DC

## 2013-06-02 NOTE — Progress Notes (Signed)
Pre-visit discussion using our clinic review tool. No additional management support is needed unless otherwise documented below in the visit note.  

## 2013-06-02 NOTE — Telephone Encounter (Signed)
Refilled everything but coumadin, forwarded to Villa Herb, RN for coumadin refill

## 2013-06-02 NOTE — Telephone Encounter (Signed)
[facility] also requesting:  warfarin (COUMADIN) 1 MG tablet warfarin (COUMADIN) 5 MG tablet ibandronate (BONIVA) 150 MG tablet

## 2013-06-02 NOTE — Telephone Encounter (Signed)
[facility] requesting new script for:  furosemide (LASIX) 40 MG tablet levothyroxine (SYNTHROID, LEVOTHROID) 125 MCG tablet Potassium CL TB (not listed on med list)

## 2013-06-11 ENCOUNTER — Other Ambulatory Visit: Payer: Self-pay | Admitting: General Practice

## 2013-06-11 MED ORDER — WARFARIN SODIUM 1 MG PO TABS: ORAL_TABLET | ORAL | Status: DC

## 2013-06-20 ENCOUNTER — Other Ambulatory Visit: Payer: Self-pay | Admitting: Internal Medicine

## 2013-07-02 ENCOUNTER — Ambulatory Visit (INDEPENDENT_AMBULATORY_CARE_PROVIDER_SITE_OTHER): Payer: 59 | Admitting: General Practice

## 2013-07-02 DIAGNOSIS — Z954 Presence of other heart-valve replacement: Secondary | ICD-10-CM

## 2013-07-02 DIAGNOSIS — Z8679 Personal history of other diseases of the circulatory system: Secondary | ICD-10-CM

## 2013-07-02 DIAGNOSIS — Z952 Presence of prosthetic heart valve: Secondary | ICD-10-CM

## 2013-07-02 DIAGNOSIS — Z5181 Encounter for therapeutic drug level monitoring: Secondary | ICD-10-CM | POA: Insufficient documentation

## 2013-07-02 LAB — POCT INR: INR: 2.2

## 2013-07-02 NOTE — Progress Notes (Signed)
Pre-visit discussion using our clinic review tool. No additional management support is needed unless otherwise documented below in the visit note.  

## 2013-07-04 ENCOUNTER — Other Ambulatory Visit: Payer: Self-pay | Admitting: Internal Medicine

## 2013-07-05 ENCOUNTER — Other Ambulatory Visit: Payer: Self-pay | Admitting: General Practice

## 2013-07-05 MED ORDER — WARFARIN SODIUM 1 MG PO TABS: ORAL_TABLET | ORAL | Status: DC

## 2013-07-14 ENCOUNTER — Other Ambulatory Visit: Payer: Self-pay | Admitting: General Practice

## 2013-07-14 MED ORDER — WARFARIN SODIUM 5 MG PO TABS: ORAL_TABLET | ORAL | Status: DC

## 2013-08-06 ENCOUNTER — Ambulatory Visit (INDEPENDENT_AMBULATORY_CARE_PROVIDER_SITE_OTHER): Payer: 59 | Admitting: General Practice

## 2013-08-06 DIAGNOSIS — Z954 Presence of other heart-valve replacement: Secondary | ICD-10-CM

## 2013-08-06 DIAGNOSIS — Z5181 Encounter for therapeutic drug level monitoring: Secondary | ICD-10-CM

## 2013-08-06 DIAGNOSIS — Z8679 Personal history of other diseases of the circulatory system: Secondary | ICD-10-CM

## 2013-08-06 DIAGNOSIS — Z952 Presence of prosthetic heart valve: Secondary | ICD-10-CM

## 2013-08-06 LAB — POCT INR: INR: 2.2

## 2013-08-06 NOTE — Progress Notes (Signed)
Pre visit review using our clinic review tool, if applicable. No additional management support is needed unless otherwise documented below in the visit note. 

## 2013-08-24 ENCOUNTER — Other Ambulatory Visit: Payer: Self-pay | Admitting: Internal Medicine

## 2013-08-25 ENCOUNTER — Ambulatory Visit (INDEPENDENT_AMBULATORY_CARE_PROVIDER_SITE_OTHER): Payer: 59 | Admitting: General Practice

## 2013-08-25 DIAGNOSIS — Z954 Presence of other heart-valve replacement: Secondary | ICD-10-CM

## 2013-08-25 DIAGNOSIS — Z8679 Personal history of other diseases of the circulatory system: Secondary | ICD-10-CM

## 2013-08-25 DIAGNOSIS — Z5181 Encounter for therapeutic drug level monitoring: Secondary | ICD-10-CM

## 2013-08-25 DIAGNOSIS — Z952 Presence of prosthetic heart valve: Secondary | ICD-10-CM

## 2013-08-25 LAB — POCT INR: INR: 2.5

## 2013-08-25 NOTE — Progress Notes (Signed)
Pre visit review using our clinic review tool, if applicable. No additional management support is needed unless otherwise documented below in the visit note. 

## 2013-09-20 ENCOUNTER — Ambulatory Visit (INDEPENDENT_AMBULATORY_CARE_PROVIDER_SITE_OTHER): Payer: 59 | Admitting: General Practice

## 2013-09-20 DIAGNOSIS — Z954 Presence of other heart-valve replacement: Secondary | ICD-10-CM

## 2013-09-20 DIAGNOSIS — Z5181 Encounter for therapeutic drug level monitoring: Secondary | ICD-10-CM

## 2013-09-20 DIAGNOSIS — Z952 Presence of prosthetic heart valve: Secondary | ICD-10-CM

## 2013-09-20 DIAGNOSIS — Z8679 Personal history of other diseases of the circulatory system: Secondary | ICD-10-CM

## 2013-09-20 LAB — POCT INR: INR: 2.6

## 2013-10-17 ENCOUNTER — Encounter: Payer: Self-pay | Admitting: Internal Medicine

## 2013-10-26 ENCOUNTER — Ambulatory Visit (INDEPENDENT_AMBULATORY_CARE_PROVIDER_SITE_OTHER): Payer: 59 | Admitting: General Practice

## 2013-10-26 DIAGNOSIS — Z952 Presence of prosthetic heart valve: Secondary | ICD-10-CM

## 2013-10-26 DIAGNOSIS — Z954 Presence of other heart-valve replacement: Secondary | ICD-10-CM

## 2013-10-26 DIAGNOSIS — Z5181 Encounter for therapeutic drug level monitoring: Secondary | ICD-10-CM

## 2013-10-26 DIAGNOSIS — Z8679 Personal history of other diseases of the circulatory system: Secondary | ICD-10-CM

## 2013-10-26 LAB — POCT INR: INR: 3.9

## 2013-10-26 NOTE — Progress Notes (Signed)
Pre visit review using our clinic review tool, if applicable. No additional management support is needed unless otherwise documented below in the visit note. 

## 2013-11-03 ENCOUNTER — Other Ambulatory Visit: Payer: Self-pay | Admitting: Internal Medicine

## 2013-11-04 ENCOUNTER — Other Ambulatory Visit: Payer: Self-pay | Admitting: General Practice

## 2013-11-04 MED ORDER — POTASSIUM CHLORIDE CRYS ER 20 MEQ PO TBCR: 20.0000 meq | EXTENDED_RELEASE_TABLET | Freq: Every day | ORAL | Status: DC

## 2013-11-04 MED ORDER — WARFARIN SODIUM 1 MG PO TABS: ORAL_TABLET | ORAL | Status: DC

## 2013-11-04 MED ORDER — WARFARIN SODIUM 5 MG PO TABS: ORAL_TABLET | ORAL | Status: DC

## 2013-12-14 ENCOUNTER — Ambulatory Visit (INDEPENDENT_AMBULATORY_CARE_PROVIDER_SITE_OTHER): Payer: 59 | Admitting: General Practice

## 2013-12-14 DIAGNOSIS — Z8679 Personal history of other diseases of the circulatory system: Secondary | ICD-10-CM

## 2013-12-14 DIAGNOSIS — Z952 Presence of prosthetic heart valve: Secondary | ICD-10-CM

## 2013-12-14 DIAGNOSIS — Z954 Presence of other heart-valve replacement: Secondary | ICD-10-CM

## 2013-12-14 DIAGNOSIS — Z5181 Encounter for therapeutic drug level monitoring: Secondary | ICD-10-CM

## 2013-12-14 LAB — POCT INR: INR: 4.9

## 2013-12-14 NOTE — Progress Notes (Signed)
Pre visit review using our clinic review tool, if applicable. No additional management support is needed unless otherwise documented below in the visit note. 

## 2013-12-28 ENCOUNTER — Other Ambulatory Visit: Payer: Self-pay | Admitting: Internal Medicine

## 2014-01-04 ENCOUNTER — Ambulatory Visit (INDEPENDENT_AMBULATORY_CARE_PROVIDER_SITE_OTHER): Payer: 59 | Admitting: Family Medicine

## 2014-01-04 DIAGNOSIS — Z952 Presence of prosthetic heart valve: Secondary | ICD-10-CM

## 2014-01-04 DIAGNOSIS — Z5181 Encounter for therapeutic drug level monitoring: Secondary | ICD-10-CM

## 2014-01-04 DIAGNOSIS — Z954 Presence of other heart-valve replacement: Secondary | ICD-10-CM

## 2014-01-04 DIAGNOSIS — Z8679 Personal history of other diseases of the circulatory system: Secondary | ICD-10-CM

## 2014-01-04 LAB — POCT INR: INR: 4.5

## 2014-01-17 ENCOUNTER — Ambulatory Visit (INDEPENDENT_AMBULATORY_CARE_PROVIDER_SITE_OTHER): Payer: 59 | Admitting: Family Medicine

## 2014-01-17 ENCOUNTER — Encounter: Payer: Self-pay | Admitting: Family Medicine

## 2014-01-17 VITALS — BP 110/70 | HR 80 | Temp 97.9°F | Ht 66.0 in | Wt 133.0 lb

## 2014-01-17 DIAGNOSIS — D509 Iron deficiency anemia, unspecified: Secondary | ICD-10-CM

## 2014-01-17 DIAGNOSIS — E039 Hypothyroidism, unspecified: Secondary | ICD-10-CM

## 2014-01-17 DIAGNOSIS — M810 Age-related osteoporosis without current pathological fracture: Secondary | ICD-10-CM

## 2014-01-17 DIAGNOSIS — I509 Heart failure, unspecified: Secondary | ICD-10-CM

## 2014-01-17 LAB — CBC
HCT: 38.3 % (ref 36.0–46.0)
Hemoglobin: 12.4 g/dL (ref 12.0–15.0)
MCHC: 32.5 g/dL (ref 30.0–36.0)
MCV: 90.9 fl (ref 78.0–100.0)
Platelets: 418 10*3/uL — ABNORMAL HIGH (ref 150.0–400.0)
RBC: 4.21 Mil/uL (ref 3.87–5.11)
RDW: 15.9 % — ABNORMAL HIGH (ref 11.5–15.5)
WBC: 11 10*3/uL — ABNORMAL HIGH (ref 4.0–10.5)

## 2014-01-17 LAB — COMPREHENSIVE METABOLIC PANEL
ALT: 30 U/L (ref 0–35)
AST: 41 U/L — ABNORMAL HIGH (ref 0–37)
Albumin: 4.6 g/dL (ref 3.5–5.2)
Alkaline Phosphatase: 58 U/L (ref 39–117)
BUN: 24 mg/dL — ABNORMAL HIGH (ref 6–23)
CO2: 28 mEq/L (ref 19–32)
Calcium: 9.8 mg/dL (ref 8.4–10.5)
Chloride: 100 mEq/L (ref 96–112)
Creatinine, Ser: 1.3 mg/dL — ABNORMAL HIGH (ref 0.4–1.2)
GFR: 43.51 mL/min — ABNORMAL LOW (ref >60.00)
Glucose, Bld: 95 mg/dL (ref 70–99)
Potassium: 4.2 mEq/L (ref 3.5–5.1)
Sodium: 137 mEq/L (ref 135–145)
Total Bilirubin: 0.4 mg/dL (ref 0.2–1.2)
Total Protein: 8.5 g/dL — ABNORMAL HIGH (ref 6.0–8.3)

## 2014-01-17 LAB — TSH: TSH: 0.53 u[IU]/mL (ref 0.35–4.50)

## 2014-01-17 LAB — VITAMIN D 25 HYDROXY (VIT D DEFICIENCY, FRACTURES): VITD: 70.08 ng/mL (ref 30.00–100.00)

## 2014-01-17 NOTE — Progress Notes (Signed)
Grace Reddish, MD Phone: [phone]  Subjective:  Patient presents today to establish care. Chief complaint-noted.   Congestive heart failure Last EF 30% 2010. Taking spironolactone and lasix. Walking 2 miles a day and doing well. ROS- no chest pain or shortness of breath  Osteoporosis Boniva started September. On calcium/vitamin D. Asks to check vitamin D.  ROS- no back pain or dysuria  Anemia Endorses fatigue at times ROS- no chest pain or shortness of breath as above. No recent bleeding.   The following were reviewed and entered/updated in epic: Past Medical History  Diagnosis Date  . Anemia   . Cancer     breast  . Hypothyroidism   . GI bleed   . Migraine   . Hodgkin's disease   . WPW (Wolff-Parkinson-White syndrome)   . Aortic insufficiency   . Hyperlipidemia   . CHF (congestive heart failure)   . AORTIC VALVE REPLACEMENT, HX OF 04/27/2007    Qualifier: Diagnosis of  By: Leanne Chang MD, Bruce    . MITRAL VALVE REPLACEMENT, HX OF 04/27/2007    Qualifier: Diagnosis of  By: Leanne Chang MD, Bruce     Patient Active Problem List   Diagnosis Date Noted  . H/O aortic valve replacement and mitral valve replacement. 01/07/2013    Priority: High  . WOLFF (WOLFE)-PARKINSON-WHITE (WPW) SYNDROME 08/16/2009    Priority: High  . CONGESTIVE HEART FAILURE 08/03/2009    Priority: High  . TRANSIENT ISCHEMIC ATTACKS, HX OF 04/27/2007    Priority: High  . Osteoporosis 01/17/2014    Priority: Medium  . ANEMIA, IRON DEFICIENCY 08/16/2009    Priority: Medium  . HYPERLIPIDEMIA-Statin Intolerant 08/29/2008    Priority: Medium  . HYPOTHYROIDISM 11/03/2006    Priority: Medium  . COMMON MIGRAINE 11/03/2006    Priority: Medium  . BREAST CANCER, HX OF 11/03/2006    Priority: Medium  . HX, PERSONAL, HODGKIN'S DISEASE 11/03/2006    Priority: Medium  . Long term (current) use of anticoagulants 12/23/2012    Priority: Low  . GASTROINTESTINAL HEMORRHAGE, HX OF 11/03/2006    Priority: Low    Past Surgical History  Procedure Laterality Date  . Endometrial ablation    . Aortic valve surgery    . Mastectomy    . Mitral valve replacement    . Splenectomy    . Thoracotomy    . Valvulopathy  06/13/06  . Pericardial stripping  9/08  . Tonsillectomy    . Bilateral breast implants      Family History  Problem Relation Age of Onset  . Heart disease Mother   . Cancer Mother     breast  . Heart disease Maternal Aunt   . Cancer Maternal Aunt     uterine  . Heart disease Maternal Grandmother   . Diabetes Maternal Grandmother     Medications- reviewed and updated Current Outpatient Prescriptions  Medication Sig Dispense Refill  . aspirin 81 MG tablet Take 81 mg by mouth daily.        . Biotin 5 MG TABS Take 1 tablet by mouth daily.        . Calcium Carbonate-Vitamin D (CALTRATE 600+D) 600-400 MG-UNIT per tablet Take 2 tablets by mouth daily.       Marland Kitchen docusate sodium (COLACE) 100 MG capsule Take 100 mg by mouth daily.       . furosemide (LASIX) 40 MG tablet Take 1 tablet by mouth  daily  90 tablet  0  . ibandronate (BONIVA) 150 MG tablet Take 1  tablet (150 mg total) by mouth every 30 (thirty) days. Take in the morning with a full glass of water, on an empty stomach, and do not take anything else by mouth or lie down for the next 60 min.  3 tablet  3  . levothyroxine (SYNTHROID, LEVOTHROID) 125 MCG tablet Take 1 tablet by mouth  daily  90 tablet  0  . Magnesium 250 MG TABS Take 1 tablet by mouth daily.      . metoprolol (TOPROL-XL) 50 MG 24 hr tablet Take 50 mg by mouth 2 (two) times daily.       . Multiple Vitamin (MULTIVITAMIN) tablet Take 1 tablet by mouth daily.        . potassium chloride SA (KLOR-CON M20) 20 MEQ tablet Take 1 tablet (20 mEq total) by mouth daily.  90 tablet  1  . spironolactone (ALDACTONE) 25 MG tablet Take 50 mg by mouth daily.       Marland Kitchen warfarin (COUMADIN) 1 MG tablet Take as directed  90 tablet  1  . warfarin (COUMADIN) 5 MG tablet Take as directed  105  tablet  1   No current facility-administered medications for this visit.    Allergies-reviewed and updated Allergies  Allergen Reactions  . Statins     myalgia    History   Social History  . Marital Status: Married    Spouse Name: N/A    Number of Children: N/A  . Years of Education: N/A   Social History Main Topics  . Smoking status: Never Smoker   . Smokeless tobacco: Never Used  . Alcohol Use: No  . Drug Use: No  . Sexual Activity: None   Other Topics Concern  . None   Social History Narrative   Lived in [place] for 18 years. Moved here for the weather.    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      Husband retired January 2015. Married 1991 (2nd marriage). No kids.       Hobbies: walks 2 miles per day, read, work outside          ROS--See HPI   Objective: BP 110/70  Pulse 80  Temp(Src) 97.9 F (36.6 C)  Ht 5\' 6"  (1.676 m)  Wt 133 lb (60.328 kg)  BMI 21.48 kg/m2 Gen: NAD, resting comfortably HEENT: Mucous membranes are moist. Oropharynx normal Neck: no thyromegaly CV: RRR no murmurs rubs or gallops Lungs: CTAB no crackles, wheeze, rhonchi Abdomen: soft/nontender/nondistended/normal bowel sounds. No rebound or guarding.  Ext: no edema Skin: warm, dry Neuro: grossly normal, moves all extremities, PERRLA  Assessment/Plan:  Osteoporosis Check Vitamin D today. Continue boniva. Reassess dexa 9//2017.   CONGESTIVE HEART FAILURE Check CMET today to assess electrolytes. Stable. Continue lasix and spironolactone. Has cards f/u at [facility] 02/2014.   ANEMIA, IRON DEFICIENCY Check CBC today. Previously stable 11.5-12.5.   HYPOTHYROIDISM Check TSH today   Orders Placed This Encounter  Procedures  . CBC    [place]  . TSH    [place]  . Vit D  25 hydroxy (rtn osteoporosis monitoring)    [place]  . Comprehensive metabolic panel    [place]

## 2014-01-17 NOTE — Patient Instructions (Signed)
Wonderful to meet you and hear your story!   Labs today-will send you results by mychart.   Consider getting in October or so: Health Maintenance Due  Topic Date Due  . Influenza Vaccine  12/25/2013

## 2014-01-17 NOTE — Assessment & Plan Note (Signed)
Check CBC today. Previously stable 11.5-12.5.

## 2014-01-17 NOTE — Assessment & Plan Note (Addendum)
Check CMET today to assess electrolytes. Stable. Continue lasix and spironolactone. Has cards f/u at [facility] 02/2014.

## 2014-01-17 NOTE — Assessment & Plan Note (Signed)
Check Vitamin D today. Continue boniva. Reassess dexa 9//2017.

## 2014-01-17 NOTE — Assessment & Plan Note (Signed)
Check TSH today

## 2014-01-18 ENCOUNTER — Encounter: Payer: Self-pay | Admitting: Family Medicine

## 2014-01-18 DIAGNOSIS — N183 Chronic kidney disease, stage 3 unspecified: Secondary | ICD-10-CM | POA: Insufficient documentation

## 2014-01-21 LAB — HM MAMMOGRAPHY: HM Mammogram: NORMAL

## 2014-01-25 ENCOUNTER — Encounter: Payer: Self-pay | Admitting: Family Medicine

## 2014-01-26 ENCOUNTER — Ambulatory Visit (INDEPENDENT_AMBULATORY_CARE_PROVIDER_SITE_OTHER): Payer: 59 | Admitting: *Deleted

## 2014-01-26 DIAGNOSIS — Z954 Presence of other heart-valve replacement: Secondary | ICD-10-CM

## 2014-01-26 DIAGNOSIS — Z952 Presence of prosthetic heart valve: Secondary | ICD-10-CM

## 2014-01-26 DIAGNOSIS — Z5181 Encounter for therapeutic drug level monitoring: Secondary | ICD-10-CM

## 2014-01-26 DIAGNOSIS — Z8679 Personal history of other diseases of the circulatory system: Secondary | ICD-10-CM

## 2014-01-26 LAB — POCT INR: INR: 2.8

## 2014-03-11 ENCOUNTER — Other Ambulatory Visit: Payer: Self-pay

## 2014-03-21 ENCOUNTER — Telehealth: Payer: Self-pay | Admitting: Family Medicine

## 2014-03-21 ENCOUNTER — Other Ambulatory Visit: Payer: Self-pay | Admitting: *Deleted

## 2014-03-21 MED ORDER — IBANDRONATE SODIUM 150 MG PO TABS: 150.0000 mg | ORAL_TABLET | ORAL | Status: DC

## 2014-03-21 MED ORDER — WARFARIN SODIUM 1 MG PO TABS: ORAL_TABLET | ORAL | Status: DC

## 2014-03-21 MED ORDER — WARFARIN SODIUM 5 MG PO TABS: ORAL_TABLET | ORAL | Status: DC

## 2014-03-21 MED ORDER — POTASSIUM CHLORIDE CRYS ER 20 MEQ PO TBCR: 20.0000 meq | EXTENDED_RELEASE_TABLET | Freq: Every day | ORAL | Status: DC

## 2014-03-21 MED ORDER — LEVOTHYROXINE SODIUM 125 MCG PO TABS: 125.0000 ug | ORAL_TABLET | Freq: Every day | ORAL | Status: DC

## 2014-03-21 MED ORDER — FUROSEMIDE 40 MG PO TABS: 40.0000 mg | ORAL_TABLET | Freq: Every day | ORAL | Status: DC

## 2014-03-21 NOTE — Telephone Encounter (Signed)
°  [place] [place] [address] [facility] is requesting re-fills on the following: furosemide (LASIX) 40 MG tablet levothyroxine (SYNTHROID, LEVOTHROID) 125 MCG tablet potassium chloride SA (KLOR-CON M20) 20 MEQ tablet  Grace [name] there's a re-fill request for WARFARIN TAB. Pt is taking 1 mg and 5 mg.

## 2014-03-21 NOTE — Telephone Encounter (Signed)
[place] [facility] is requesting re-fill on ibandronate (BONIVA) 150 MG tablet

## 2014-03-21 NOTE — Telephone Encounter (Signed)
Medications refilled

## 2014-03-21 NOTE — Telephone Encounter (Signed)
Sent to Georgann Housekeeper, RN at [facility]

## 2014-03-21 NOTE — Telephone Encounter (Signed)
Medication refilled

## 2014-03-23 ENCOUNTER — Ambulatory Visit (INDEPENDENT_AMBULATORY_CARE_PROVIDER_SITE_OTHER): Payer: 59 | Admitting: *Deleted

## 2014-03-23 DIAGNOSIS — Z5181 Encounter for therapeutic drug level monitoring: Secondary | ICD-10-CM | POA: Diagnosis not present

## 2014-03-23 DIAGNOSIS — Z954 Presence of other heart-valve replacement: Secondary | ICD-10-CM | POA: Diagnosis not present

## 2014-03-23 DIAGNOSIS — Z952 Presence of prosthetic heart valve: Secondary | ICD-10-CM

## 2014-03-23 DIAGNOSIS — Z8679 Personal history of other diseases of the circulatory system: Secondary | ICD-10-CM | POA: Diagnosis not present

## 2014-03-23 LAB — POCT INR: INR: 2.7

## 2014-04-13 ENCOUNTER — Other Ambulatory Visit: Payer: Self-pay | Admitting: Dermatology

## 2014-04-20 ENCOUNTER — Ambulatory Visit (INDEPENDENT_AMBULATORY_CARE_PROVIDER_SITE_OTHER): Payer: 59

## 2014-04-20 DIAGNOSIS — Z8679 Personal history of other diseases of the circulatory system: Secondary | ICD-10-CM

## 2014-04-20 DIAGNOSIS — Z954 Presence of other heart-valve replacement: Secondary | ICD-10-CM

## 2014-04-20 DIAGNOSIS — Z5181 Encounter for therapeutic drug level monitoring: Secondary | ICD-10-CM

## 2014-04-20 DIAGNOSIS — Z952 Presence of prosthetic heart valve: Secondary | ICD-10-CM

## 2014-04-20 LAB — POCT INR: INR: 1.9

## 2014-05-05 ENCOUNTER — Emergency Department (HOSPITAL_COMMUNITY)
Admission: EM | Admit: 2014-05-05 | Discharge: 2014-05-05 | Disposition: A | Discharge status: Home / Self Care | Payer: 59 | Source: Home / Self Care | Attending: Emergency Medicine | Admitting: Emergency Medicine

## 2014-05-05 ENCOUNTER — Encounter (HOSPITAL_COMMUNITY): Payer: Self-pay | Admitting: Emergency Medicine

## 2014-05-05 ENCOUNTER — Emergency Department (INDEPENDENT_AMBULATORY_CARE_PROVIDER_SITE_OTHER): Payer: 59

## 2014-05-05 DIAGNOSIS — S66911A Strain of unspecified muscle, fascia and tendon at wrist and hand level, right hand, initial encounter: Secondary | ICD-10-CM

## 2014-05-05 DIAGNOSIS — IMO0001 Reserved for inherently not codable concepts without codable children: Secondary | ICD-10-CM

## 2014-05-05 DIAGNOSIS — S6990XA Unspecified injury of unspecified wrist, hand and finger(s), initial encounter: Secondary | ICD-10-CM

## 2014-05-05 NOTE — ED Provider Notes (Signed)
CSN: [id number]     Arrival date & time 05/05/14  1707 History   First MD Initiated Contact with Patient 05/05/14 1722     Chief Complaint  Patient presents with  . Wrist Injury   (Consider location/radiation/quality/duration/timing/severity/associated sxs/prior Treatment) HPI  She is a 63 year old woman here for evaluation of right hand pain. She states about a week ago she fell backwards and landed on the right hand. For the last 3-4 days she reports increasing pain and swelling at the base of her thumb. She does still have full range of motion of her thumb, but with some discomfort.  She also notes a little bump popped up on her wrist.  Past Medical History  Diagnosis Date  . Anemia   . Cancer     breast  . Hypothyroidism   . GI bleed   . Migraine   . Hodgkin's disease   . WPW (Wolff-Parkinson-White syndrome)   . Aortic insufficiency   . Hyperlipidemia   . CHF (congestive heart failure)   . AORTIC VALVE REPLACEMENT, HX OF 04/27/2007    Qualifier: Diagnosis of  By: Leanne Chang MD, Bruce    . MITRAL VALVE REPLACEMENT, HX OF 04/27/2007    Qualifier: Diagnosis of  By: Leanne Chang MD, Bruce     Past Surgical History  Procedure Laterality Date  . Endometrial ablation    . Aortic valve surgery    . Mastectomy    . Mitral valve replacement    . Splenectomy    . Thoracotomy    . Valvulopathy  06/13/06  . Pericardial stripping  9/08  . Tonsillectomy    . Bilateral breast implants     Family History  Problem Relation Age of Onset  . Heart disease Mother   . Cancer Mother     breast  . Heart disease Maternal Aunt   . Cancer Maternal Aunt     uterine  . Heart disease Maternal Grandmother   . Diabetes Maternal Grandmother    History  Substance Use Topics  . Smoking status: Never Smoker   . Smokeless tobacco: Never Used  . Alcohol Use: No   OB History    No data available     Review of Systems Right hand pain Allergies  Statins  Home Medications   Prior to Admission  medications   Medication Sig Start Date End Date Taking? Authorizing Provider  aspirin 81 MG tablet Take 81 mg by mouth daily.     Yes Historical Provider, MD  Biotin 5 MG TABS Take 1 tablet by mouth daily.     Yes Historical Provider, MD  docusate sodium (COLACE) 100 MG capsule Take 100 mg by mouth daily.    Yes Historical Provider, MD  furosemide (LASIX) 40 MG tablet Take 1 tablet (40 mg total) by mouth daily. 03/21/14  Yes Marin Olp, MD  ibandronate (BONIVA) 150 MG tablet Take 1 tablet (150 mg total) by mouth every 30 (thirty) days. Take in the morning with a full glass of water, on an empty stomach, and do not take anything else by mouth or lie down for the next 60 min. 03/21/14  Yes Marin Olp, MD  levothyroxine (SYNTHROID, LEVOTHROID) 125 MCG tablet Take 1 tablet (125 mcg total) by mouth daily before breakfast. 03/21/14  Yes Marin Olp, MD  Magnesium 250 MG TABS Take 1 tablet by mouth daily.   Yes Historical Provider, MD  metoprolol (TOPROL-XL) 50 MG 24 hr tablet Take 50 mg by mouth 2 (  two) times daily.    Yes Historical Provider, MD  Multiple Vitamin (MULTIVITAMIN) tablet Take 1 tablet by mouth daily.     Yes Historical Provider, MD  potassium chloride SA (KLOR-CON M20) 20 MEQ tablet Take 1 tablet (20 mEq total) by mouth daily. 03/21/14  Yes Marin Olp, MD  spironolactone (ALDACTONE) 25 MG tablet Take 50 mg by mouth daily.    Yes Historical Provider, MD  warfarin (COUMADIN) 1 MG tablet Take as directed 03/21/14  Yes Marin Olp, MD  warfarin (COUMADIN) 5 MG tablet Take as directed 03/21/14  Yes Marin Olp, MD  Calcium Carbonate-Vitamin D (CALTRATE 600+D) 600-400 MG-UNIT per tablet Take 2 tablets by mouth daily.     Historical Provider, MD   BP 115/73 mmHg  Pulse 76  Temp(Src) 98.3 F (36.8 C) (Oral)  Resp 16  SpO2 99% Physical Exam  Constitutional: She is oriented to person, place, and time. She appears well-developed and well-nourished. No distress.   Cardiovascular: Normal rate.   Pulmonary/Chest: Effort normal.  Musculoskeletal:  Right hand: swelling and bruising of thenar eminence.  0.5cm cyst at wrist fold.  Full ROM of thumb, 5/5 strength in opposition.  Brisk cap refill in thumb.  Tender over anatomic snuffbox.  Neurological: She is alert and oriented to person, place, and time.    ED Course  Procedures (including critical care time) Labs Review Labs Reviewed - No data to display  Imaging Review Dg Hand Complete Right  05/05/2014   CLINICAL DATA:  62 year old female fell week ago with pain and swelling at the base of the thumb. Initial encounter.  EXAM: RIGHT HAND - COMPLETE 3+ VIEW  COMPARISON:  None.  FINDINGS: Distal radius and ulna appear intact. Carpal bone alignment within normal limits. Bone mineralization is within normal limits. Right thumb metacarpal appears intact. Right first [facility] joint appears within normal limits. Right thumb phalanges appear intact. Alignment they are within normal limits. Other metacarpals and phalanges appear intact.  IMPRESSION: No acute fracture or dislocation identified about the right thumb, right hand.   Electronically Signed   By: Lars Pinks M.D.   On: 05/05/2014 17:50     MDM   1. Strain of right thumb, initial encounter   2. Hand injury    X-ray negative for fracture. Thumb spica splint for comfort. Tylenol as needed for pain. Ice 2-3 times a day.  Follow-up with PCP if no improvement after one to 2 weeks.    Melony Overly, MD 05/05/14 [phone]

## 2014-05-05 NOTE — ED Notes (Signed)
Patient reports she fell backwards and used her hands to break her fall x 2 days ago. Right wrist is visibly swollen and bruised. Patient reports full ROM but painful. NAD.

## 2014-05-05 NOTE — Discharge Instructions (Signed)
Wear the brace for the next week.  You can take it off to shower and do gentle range of motion. Apply ice 2-3 times a day. Take tylenol for pain.  You should see improvement in the next 1-2 weeks.

## 2014-05-11 ENCOUNTER — Telehealth: Payer: Self-pay | Admitting: Family

## 2014-05-11 ENCOUNTER — Ambulatory Visit (INDEPENDENT_AMBULATORY_CARE_PROVIDER_SITE_OTHER): Payer: 59

## 2014-05-11 DIAGNOSIS — Z8679 Personal history of other diseases of the circulatory system: Secondary | ICD-10-CM

## 2014-05-11 DIAGNOSIS — Z952 Presence of prosthetic heart valve: Secondary | ICD-10-CM

## 2014-05-11 DIAGNOSIS — Z954 Presence of other heart-valve replacement: Secondary | ICD-10-CM

## 2014-05-11 DIAGNOSIS — Z5181 Encounter for therapeutic drug level monitoring: Secondary | ICD-10-CM

## 2014-05-11 LAB — POCT INR: INR: 1.6

## 2014-05-11 NOTE — Telephone Encounter (Signed)
Agree with plan 

## 2014-05-18 ENCOUNTER — Ambulatory Visit (INDEPENDENT_AMBULATORY_CARE_PROVIDER_SITE_OTHER): Payer: 59 | Admitting: Family Medicine

## 2014-05-18 DIAGNOSIS — Z8679 Personal history of other diseases of the circulatory system: Secondary | ICD-10-CM

## 2014-05-18 DIAGNOSIS — Z5181 Encounter for therapeutic drug level monitoring: Secondary | ICD-10-CM

## 2014-05-18 DIAGNOSIS — Z952 Presence of prosthetic heart valve: Secondary | ICD-10-CM

## 2014-05-18 LAB — POCT INR: INR: 4.5

## 2014-05-23 ENCOUNTER — Other Ambulatory Visit: Payer: Self-pay | Admitting: Dermatology

## 2014-05-24 ENCOUNTER — Telehealth: Payer: Self-pay | Admitting: *Deleted

## 2014-05-24 DIAGNOSIS — E785 Hyperlipidemia, unspecified: Secondary | ICD-10-CM

## 2014-05-24 DIAGNOSIS — E039 Hypothyroidism, unspecified: Secondary | ICD-10-CM

## 2014-05-24 DIAGNOSIS — I509 Heart failure, unspecified: Secondary | ICD-10-CM

## 2014-05-24 DIAGNOSIS — Z8571 Personal history of Hodgkin lymphoma: Secondary | ICD-10-CM

## 2014-05-24 DIAGNOSIS — D509 Iron deficiency anemia, unspecified: Secondary | ICD-10-CM

## 2014-05-24 NOTE — Telephone Encounter (Signed)
Patient would like to schedule a follow up office visit for February.  However, she would like to know if she should come for labs ahead of time.  If so, which labs should we order?

## 2014-05-25 NOTE — Telephone Encounter (Signed)
Cbc, cmet, tsh, lipids please

## 2014-05-26 NOTE — Telephone Encounter (Signed)
Left a message for her to call and schedule fasting labs. I did see where is going o [facility] for PT/INR on Wed at 8:15. She can have them done there if she would like.

## 2014-05-26 NOTE — Telephone Encounter (Signed)
Lab orders have been placed.  Please call and schedule patient for fasting lab appointment.  Thank you

## 2014-06-01 ENCOUNTER — Ambulatory Visit (INDEPENDENT_AMBULATORY_CARE_PROVIDER_SITE_OTHER): Payer: Medicare Other | Admitting: Family Medicine

## 2014-06-01 DIAGNOSIS — Z8679 Personal history of other diseases of the circulatory system: Secondary | ICD-10-CM

## 2014-06-01 DIAGNOSIS — Z954 Presence of other heart-valve replacement: Secondary | ICD-10-CM

## 2014-06-01 DIAGNOSIS — Z952 Presence of prosthetic heart valve: Secondary | ICD-10-CM

## 2014-06-01 DIAGNOSIS — Z5181 Encounter for therapeutic drug level monitoring: Secondary | ICD-10-CM

## 2014-06-01 LAB — POCT INR: INR: 4.2

## 2014-06-21 ENCOUNTER — Telehealth: Payer: Self-pay | Admitting: Internal Medicine

## 2014-06-21 NOTE — Telephone Encounter (Signed)
Pt does not need recall colon , she was never taken out of recall schedule after having virtual colonoscopy. Next colon exam ( virtual colon ) in 10 years,

## 2014-06-21 NOTE — Telephone Encounter (Signed)
Dr. Olevia Perches pt has questions regarding a letter she received to schedule recall colon. Pt states she had virtual colon in 2014 and wants to know if she is really due and if she is can she have another virtual colon. Please advise.

## 2014-06-22 ENCOUNTER — Ambulatory Visit (INDEPENDENT_AMBULATORY_CARE_PROVIDER_SITE_OTHER): Payer: Medicare Other

## 2014-06-22 ENCOUNTER — Other Ambulatory Visit (INDEPENDENT_AMBULATORY_CARE_PROVIDER_SITE_OTHER): Payer: Medicare Other

## 2014-06-22 DIAGNOSIS — D509 Iron deficiency anemia, unspecified: Secondary | ICD-10-CM

## 2014-06-22 DIAGNOSIS — E039 Hypothyroidism, unspecified: Secondary | ICD-10-CM

## 2014-06-22 DIAGNOSIS — I509 Heart failure, unspecified: Secondary | ICD-10-CM

## 2014-06-22 DIAGNOSIS — Z952 Presence of prosthetic heart valve: Secondary | ICD-10-CM

## 2014-06-22 DIAGNOSIS — Z7901 Long term (current) use of anticoagulants: Secondary | ICD-10-CM

## 2014-06-22 DIAGNOSIS — Z954 Presence of other heart-valve replacement: Secondary | ICD-10-CM

## 2014-06-22 DIAGNOSIS — E785 Hyperlipidemia, unspecified: Secondary | ICD-10-CM

## 2014-06-22 DIAGNOSIS — Z8571 Personal history of Hodgkin lymphoma: Secondary | ICD-10-CM

## 2014-06-22 LAB — LIPID PANEL
Cholesterol: 200 mg/dL (ref 0–200)
HDL: 51.1 mg/dL (ref >39.00)
LDL Cholesterol: 121 mg/dL — ABNORMAL HIGH (ref 0–99)
NonHDL: 148.9
Total CHOL/HDL Ratio: 4
Triglycerides: 142 mg/dL (ref 0.0–149.0)
VLDL: 28.4 mg/dL (ref 0.0–40.0)

## 2014-06-22 LAB — COMPREHENSIVE METABOLIC PANEL
ALT: 27 U/L (ref 0–35)
AST: 31 U/L (ref 0–37)
Albumin: 4 g/dL (ref 3.5–5.2)
Alkaline Phosphatase: 50 U/L (ref 39–117)
BUN: 25 mg/dL — ABNORMAL HIGH (ref 6–23)
CO2: 29 mEq/L (ref 19–32)
Calcium: 9.4 mg/dL (ref 8.4–10.5)
Chloride: 104 mEq/L (ref 96–112)
Creatinine, Ser: 1.41 mg/dL — ABNORMAL HIGH (ref 0.40–1.20)
GFR: 39.92 mL/min — ABNORMAL LOW (ref >60.00)
Glucose, Bld: 81 mg/dL (ref 70–99)
Potassium: 4.1 mEq/L (ref 3.5–5.1)
Sodium: 139 mEq/L (ref 135–145)
Total Bilirubin: 0.3 mg/dL (ref 0.2–1.2)
Total Protein: 7.2 g/dL (ref 6.0–8.3)

## 2014-06-22 LAB — CBC
HCT: 35.2 % — ABNORMAL LOW (ref 36.0–46.0)
Hemoglobin: 11.6 g/dL — ABNORMAL LOW (ref 12.0–15.0)
MCHC: 33 g/dL (ref 30.0–36.0)
MCV: 91.9 fl (ref 78.0–100.0)
Platelets: 348 10*3/uL (ref 150.0–400.0)
RBC: 3.83 Mil/uL — ABNORMAL LOW (ref 3.87–5.11)
RDW: 14.1 % (ref 11.5–15.5)
WBC: 7.4 10*3/uL (ref 4.0–10.5)

## 2014-06-22 LAB — POCT INR: INR: 4.3

## 2014-06-22 LAB — TSH: TSH: 0.63 u[IU]/mL (ref 0.35–4.50)

## 2014-06-22 NOTE — Telephone Encounter (Signed)
Left message for pt to call back.  Spoke with pt and she is aware. 

## 2014-06-28 ENCOUNTER — Telehealth: Payer: Self-pay | Admitting: Family

## 2014-06-28 ENCOUNTER — Ambulatory Visit (INDEPENDENT_AMBULATORY_CARE_PROVIDER_SITE_OTHER): Payer: Medicare Other | Admitting: General Practice

## 2014-06-28 DIAGNOSIS — Z954 Presence of other heart-valve replacement: Secondary | ICD-10-CM

## 2014-06-28 DIAGNOSIS — Z8679 Personal history of other diseases of the circulatory system: Secondary | ICD-10-CM

## 2014-06-28 DIAGNOSIS — Z952 Presence of prosthetic heart valve: Secondary | ICD-10-CM

## 2014-06-28 DIAGNOSIS — Z5181 Encounter for therapeutic drug level monitoring: Secondary | ICD-10-CM

## 2014-06-28 LAB — POCT INR
INR: 3.4
INR: 3.4

## 2014-06-28 NOTE — Telephone Encounter (Signed)
Agree with plan 

## 2014-06-28 NOTE — Progress Notes (Signed)
Pre visit review using our clinic review tool, if applicable. No additional management support is needed unless otherwise documented below in the visit note. 

## 2014-07-20 ENCOUNTER — Ambulatory Visit (INDEPENDENT_AMBULATORY_CARE_PROVIDER_SITE_OTHER): Payer: Medicare Other | Admitting: General Practice

## 2014-07-20 DIAGNOSIS — Z952 Presence of prosthetic heart valve: Secondary | ICD-10-CM

## 2014-07-20 DIAGNOSIS — Z954 Presence of other heart-valve replacement: Secondary | ICD-10-CM

## 2014-07-20 LAB — POCT INR: INR: 3.8

## 2014-07-20 NOTE — Progress Notes (Signed)
Pre visit review using our clinic review tool, if applicable. No additional management support is needed unless otherwise documented below in the visit note. 

## 2014-07-20 NOTE — Progress Notes (Signed)
Agree with plan 

## 2014-08-10 ENCOUNTER — Ambulatory Visit: Payer: Medicare Other

## 2014-08-17 ENCOUNTER — Other Ambulatory Visit: Payer: Self-pay | Admitting: Family Medicine

## 2014-08-17 ENCOUNTER — Other Ambulatory Visit: Payer: Self-pay | Admitting: General Practice

## 2014-08-17 MED ORDER — WARFARIN SODIUM 1 MG PO TABS: ORAL_TABLET | ORAL | Status: DC

## 2014-08-17 NOTE — Telephone Encounter (Signed)
Forwarding to [facility].

## 2014-08-31 ENCOUNTER — Ambulatory Visit (INDEPENDENT_AMBULATORY_CARE_PROVIDER_SITE_OTHER): Payer: Medicare Other | Admitting: General Practice

## 2014-08-31 DIAGNOSIS — Z952 Presence of prosthetic heart valve: Secondary | ICD-10-CM

## 2014-08-31 DIAGNOSIS — Z954 Presence of other heart-valve replacement: Secondary | ICD-10-CM | POA: Diagnosis not present

## 2014-08-31 LAB — POCT INR: INR: 2.9

## 2014-08-31 NOTE — Progress Notes (Signed)
Agree with plan 

## 2014-08-31 NOTE — Progress Notes (Signed)
Pre visit review using our clinic review tool, if applicable. No additional management support is needed unless otherwise documented below in the visit note. 

## 2014-09-20 ENCOUNTER — Other Ambulatory Visit: Payer: Self-pay | Admitting: Family Medicine

## 2014-09-28 ENCOUNTER — Other Ambulatory Visit: Payer: Self-pay | Admitting: General Practice

## 2014-09-28 ENCOUNTER — Ambulatory Visit (INDEPENDENT_AMBULATORY_CARE_PROVIDER_SITE_OTHER): Payer: Medicare Other | Admitting: General Practice

## 2014-09-28 DIAGNOSIS — Z8679 Personal history of other diseases of the circulatory system: Secondary | ICD-10-CM | POA: Diagnosis not present

## 2014-09-28 DIAGNOSIS — Z5181 Encounter for therapeutic drug level monitoring: Secondary | ICD-10-CM

## 2014-09-28 DIAGNOSIS — Z954 Presence of other heart-valve replacement: Secondary | ICD-10-CM

## 2014-09-28 DIAGNOSIS — Z952 Presence of prosthetic heart valve: Secondary | ICD-10-CM

## 2014-09-28 LAB — POCT INR: INR: 2.9

## 2014-09-28 NOTE — Progress Notes (Signed)
Pre visit review using our clinic review tool, if applicable. No additional management support is needed unless otherwise documented below in the visit note. 

## 2014-09-28 NOTE — Progress Notes (Signed)
Agree with plan 

## 2014-10-26 ENCOUNTER — Ambulatory Visit (INDEPENDENT_AMBULATORY_CARE_PROVIDER_SITE_OTHER): Payer: Medicare Other | Admitting: General Practice

## 2014-10-26 ENCOUNTER — Other Ambulatory Visit: Payer: Self-pay | Admitting: General Practice

## 2014-10-26 DIAGNOSIS — Z8679 Personal history of other diseases of the circulatory system: Secondary | ICD-10-CM | POA: Diagnosis not present

## 2014-10-26 DIAGNOSIS — Z952 Presence of prosthetic heart valve: Secondary | ICD-10-CM

## 2014-10-26 DIAGNOSIS — Z954 Presence of other heart-valve replacement: Secondary | ICD-10-CM | POA: Diagnosis not present

## 2014-10-26 DIAGNOSIS — Z5181 Encounter for therapeutic drug level monitoring: Secondary | ICD-10-CM | POA: Diagnosis not present

## 2014-10-26 LAB — POCT INR: INR: 2.4

## 2014-10-26 MED ORDER — WARFARIN SODIUM 5 MG PO TABS: ORAL_TABLET | ORAL | Status: DC

## 2014-10-26 NOTE — Progress Notes (Signed)
I have reviewed and agree with the plan. 

## 2014-10-26 NOTE — Progress Notes (Signed)
Pre visit review using our clinic review tool, if applicable. No additional management support is needed unless otherwise documented below in the visit note. 

## 2014-11-02 ENCOUNTER — Ambulatory Visit: Payer: Medicare Other

## 2014-11-08 ENCOUNTER — Telehealth: Payer: Self-pay | Admitting: General Practice

## 2014-11-08 NOTE — Telephone Encounter (Signed)
Patient called to ask me to call [facility] and give permission to change manufacturers for warfarin.   Contacted [facility] to give permission to change manufacturer.

## 2014-11-30 ENCOUNTER — Ambulatory Visit (INDEPENDENT_AMBULATORY_CARE_PROVIDER_SITE_OTHER): Payer: Medicare Other | Admitting: General Practice

## 2014-11-30 DIAGNOSIS — Z954 Presence of other heart-valve replacement: Secondary | ICD-10-CM | POA: Diagnosis not present

## 2014-11-30 DIAGNOSIS — Z5181 Encounter for therapeutic drug level monitoring: Secondary | ICD-10-CM | POA: Diagnosis not present

## 2014-11-30 DIAGNOSIS — Z8679 Personal history of other diseases of the circulatory system: Secondary | ICD-10-CM

## 2014-11-30 DIAGNOSIS — Z952 Presence of prosthetic heart valve: Secondary | ICD-10-CM

## 2014-11-30 LAB — POCT INR: INR: 3.1

## 2014-11-30 NOTE — Progress Notes (Signed)
I have reviewed and agree with the plan. 

## 2014-11-30 NOTE — Progress Notes (Signed)
Pre visit review using our clinic review tool, if applicable. No additional management support is needed unless otherwise documented below in the visit note. 

## 2014-12-28 ENCOUNTER — Ambulatory Visit (INDEPENDENT_AMBULATORY_CARE_PROVIDER_SITE_OTHER): Payer: Medicare Other | Admitting: General Practice

## 2014-12-28 DIAGNOSIS — Z952 Presence of prosthetic heart valve: Secondary | ICD-10-CM

## 2014-12-28 DIAGNOSIS — Z954 Presence of other heart-valve replacement: Secondary | ICD-10-CM

## 2014-12-28 DIAGNOSIS — Z8679 Personal history of other diseases of the circulatory system: Secondary | ICD-10-CM | POA: Diagnosis not present

## 2014-12-28 DIAGNOSIS — Z5181 Encounter for therapeutic drug level monitoring: Secondary | ICD-10-CM | POA: Diagnosis not present

## 2014-12-28 LAB — POCT INR: INR: 2.8

## 2014-12-28 NOTE — Progress Notes (Signed)
Pre visit review using our clinic review tool, if applicable. No additional management support is needed unless otherwise documented below in the visit note. 

## 2014-12-28 NOTE — Progress Notes (Signed)
I have reviewed and agree with the plan. 

## 2015-01-15 ENCOUNTER — Other Ambulatory Visit: Payer: Self-pay | Admitting: Family Medicine

## 2015-01-26 LAB — HM MAMMOGRAPHY: HM Mammogram: NORMAL

## 2015-01-27 ENCOUNTER — Encounter: Payer: Self-pay | Admitting: Family Medicine

## 2015-02-01 ENCOUNTER — Encounter: Payer: Self-pay | Admitting: Family Medicine

## 2015-02-08 ENCOUNTER — Ambulatory Visit (INDEPENDENT_AMBULATORY_CARE_PROVIDER_SITE_OTHER): Payer: Medicare Other | Admitting: General Practice

## 2015-02-08 DIAGNOSIS — Z954 Presence of other heart-valve replacement: Secondary | ICD-10-CM

## 2015-02-08 DIAGNOSIS — Z8679 Personal history of other diseases of the circulatory system: Secondary | ICD-10-CM

## 2015-02-08 DIAGNOSIS — Z5181 Encounter for therapeutic drug level monitoring: Secondary | ICD-10-CM

## 2015-02-08 DIAGNOSIS — Z952 Presence of prosthetic heart valve: Secondary | ICD-10-CM

## 2015-02-08 LAB — POCT INR: INR: 3.9

## 2015-02-08 NOTE — Progress Notes (Signed)
I have reviewed and agree with the plan. 

## 2015-02-08 NOTE — Progress Notes (Signed)
Pre visit review using our clinic review tool, if applicable. No additional management support is needed unless otherwise documented below in the visit note. 

## 2015-03-08 ENCOUNTER — Ambulatory Visit (INDEPENDENT_AMBULATORY_CARE_PROVIDER_SITE_OTHER): Payer: Medicare Other | Admitting: General Practice

## 2015-03-08 DIAGNOSIS — Z5181 Encounter for therapeutic drug level monitoring: Secondary | ICD-10-CM

## 2015-03-08 DIAGNOSIS — Z8679 Personal history of other diseases of the circulatory system: Secondary | ICD-10-CM | POA: Diagnosis not present

## 2015-03-08 DIAGNOSIS — Z954 Presence of other heart-valve replacement: Secondary | ICD-10-CM

## 2015-03-08 DIAGNOSIS — Z952 Presence of prosthetic heart valve: Secondary | ICD-10-CM

## 2015-03-08 LAB — POCT INR: INR: 2.7

## 2015-03-08 NOTE — Progress Notes (Signed)
I have reviewed and agree with the plan. 

## 2015-03-08 NOTE — Progress Notes (Signed)
Pre visit review using our clinic review tool, if applicable. No additional management support is needed unless otherwise documented below in the visit note. 

## 2015-03-10 ENCOUNTER — Encounter: Payer: Self-pay | Admitting: Family Medicine

## 2015-03-13 LAB — HM DEXA SCAN

## 2015-03-14 ENCOUNTER — Other Ambulatory Visit: Payer: Self-pay | Admitting: Family Medicine

## 2015-03-14 DIAGNOSIS — Z20828 Contact with and (suspected) exposure to other viral communicable diseases: Secondary | ICD-10-CM

## 2015-03-14 DIAGNOSIS — E785 Hyperlipidemia, unspecified: Secondary | ICD-10-CM

## 2015-03-14 NOTE — Telephone Encounter (Signed)
Too early for full lipid panel  Under hyperlipidemia- CBC, CMET, direct LDL, TSH Exposure to viral illness- hepatitis C  Not sure of her insurance and if these can just go under physical labs or not

## 2015-03-16 ENCOUNTER — Other Ambulatory Visit (INDEPENDENT_AMBULATORY_CARE_PROVIDER_SITE_OTHER): Payer: Medicare Other

## 2015-03-16 DIAGNOSIS — Z1159 Encounter for screening for other viral diseases: Secondary | ICD-10-CM

## 2015-03-16 DIAGNOSIS — E785 Hyperlipidemia, unspecified: Secondary | ICD-10-CM | POA: Diagnosis not present

## 2015-03-16 DIAGNOSIS — Z20828 Contact with and (suspected) exposure to other viral communicable diseases: Secondary | ICD-10-CM

## 2015-03-16 DIAGNOSIS — R8290 Unspecified abnormal findings in urine: Secondary | ICD-10-CM | POA: Diagnosis not present

## 2015-03-16 DIAGNOSIS — Z0000 Encounter for general adult medical examination without abnormal findings: Secondary | ICD-10-CM

## 2015-03-16 LAB — POCT URINALYSIS DIPSTICK
Bilirubin, UA: NEGATIVE
Blood, UA: NEGATIVE
Glucose, UA: NEGATIVE
Ketones, UA: NEGATIVE
Leukocytes, UA: NEGATIVE
Nitrite, UA: NEGATIVE
Protein, UA: NEGATIVE
Spec Grav, UA: 1.02
Urobilinogen, UA: 0.2
pH, UA: 7

## 2015-03-16 LAB — COMPREHENSIVE METABOLIC PANEL
ALT: 28 U/L (ref 0–35)
AST: 35 U/L (ref 0–37)
Albumin: 4.2 g/dL (ref 3.5–5.2)
Alkaline Phosphatase: 61 U/L (ref 39–117)
BUN: 23 mg/dL (ref 6–23)
CO2: 26 mEq/L (ref 19–32)
Calcium: 9.4 mg/dL (ref 8.4–10.5)
Chloride: 105 mEq/L (ref 96–112)
Creatinine, Ser: 1.27 mg/dL — ABNORMAL HIGH (ref 0.40–1.20)
GFR: 44.93 mL/min — ABNORMAL LOW (ref >60.00)
Glucose, Bld: 94 mg/dL (ref 70–99)
Potassium: 4.1 mEq/L (ref 3.5–5.1)
Sodium: 142 mEq/L (ref 135–145)
Total Bilirubin: 0.3 mg/dL (ref 0.2–1.2)
Total Protein: 7.2 g/dL (ref 6.0–8.3)

## 2015-03-16 LAB — CBC
HCT: 35.5 % — ABNORMAL LOW (ref 36.0–46.0)
Hemoglobin: 11.4 g/dL — ABNORMAL LOW (ref 12.0–15.0)
MCHC: 32 g/dL (ref 30.0–36.0)
MCV: 89.2 fl (ref 78.0–100.0)
Platelets: 383 10*3/uL (ref 150.0–400.0)
RBC: 3.98 Mil/uL (ref 3.87–5.11)
RDW: 16.8 % — ABNORMAL HIGH (ref 11.5–15.5)
WBC: 10.8 10*3/uL — ABNORMAL HIGH (ref 4.0–10.5)

## 2015-03-16 LAB — LDL CHOLESTEROL, DIRECT: Direct LDL: 101 mg/dL

## 2015-03-16 LAB — TSH: TSH: 0.4 u[IU]/mL (ref 0.35–4.50)

## 2015-03-17 LAB — HEPATITIS C ANTIBODY
HCV Ab: NEGATIVE
HCV Ab: NEGATIVE

## 2015-03-22 ENCOUNTER — Encounter: Payer: Self-pay | Admitting: Family Medicine

## 2015-03-23 ENCOUNTER — Other Ambulatory Visit: Payer: Medicare Other

## 2015-03-23 ENCOUNTER — Encounter: Payer: Self-pay | Admitting: Family Medicine

## 2015-03-28 ENCOUNTER — Encounter: Payer: Self-pay | Admitting: Family Medicine

## 2015-03-28 ENCOUNTER — Ambulatory Visit (INDEPENDENT_AMBULATORY_CARE_PROVIDER_SITE_OTHER): Payer: Medicare Other | Admitting: Family Medicine

## 2015-03-28 VITALS — BP 104/80 | HR 83 | Temp 98.8°F | Wt 137.0 lb

## 2015-03-28 DIAGNOSIS — R946 Abnormal results of thyroid function studies: Secondary | ICD-10-CM

## 2015-03-28 DIAGNOSIS — Z1211 Encounter for screening for malignant neoplasm of colon: Secondary | ICD-10-CM

## 2015-03-28 DIAGNOSIS — Z0000 Encounter for general adult medical examination without abnormal findings: Secondary | ICD-10-CM

## 2015-03-28 DIAGNOSIS — I10 Essential (primary) hypertension: Secondary | ICD-10-CM

## 2015-03-28 DIAGNOSIS — M8580 Other specified disorders of bone density and structure, unspecified site: Secondary | ICD-10-CM

## 2015-03-28 DIAGNOSIS — E039 Hypothyroidism, unspecified: Secondary | ICD-10-CM

## 2015-03-28 DIAGNOSIS — D649 Anemia, unspecified: Secondary | ICD-10-CM

## 2015-03-28 DIAGNOSIS — Z20828 Contact with and (suspected) exposure to other viral communicable diseases: Secondary | ICD-10-CM

## 2015-03-28 NOTE — Assessment & Plan Note (Signed)
unclear if Boniva helping at this point until I can look at scanned in report again- My notes show Ap spine improved by 1.1, wherears R femur worsened by 0.4, L femur worsened by 0.2 but want to confirm.    My notes:  03/13/15 R femur neck -2.2, -1.9 L femur neck. AP spine -1.4.  02/08/13. R femur neck -1.8. L femur neck -1.7. AP spine -2.5

## 2015-03-28 NOTE — Patient Instructions (Addendum)
Please find out status of pap smear and when you see GYN next week have them send [place] a copy of most recent pap  Send me a message through mychart if you havent heard about your bone density in about 6 weeks.   Stop by lab for stool cards  Let's just plan on getting vitamin D with next years physical since it was so good and you are taking your maintenance dose  Would love to see you in 6 months but at a minimum please schedule lab visit at that time  Definitely see me in a year for physical

## 2015-03-28 NOTE — Progress Notes (Signed)
Grace Reddish, MD Phone: [phone]  Subjective:  Patient presents today for their annual physical. Chief complaint-noted.   See problem oriented charting and other information below ROS- full  review of systems was completed and negative except for exertional fatigue and shortness of breath- known cardiac history. Hears heart sounds due to mechanical valve  The following were reviewed and entered/updated in epic: Past Medical History  Diagnosis Date  . Anemia   . Cancer (HCC)     breast  . Hypothyroidism   . GI bleed   . Migraine   . Hodgkin's disease   . WPW (Wolff-Parkinson-White syndrome)   . Aortic insufficiency   . Hyperlipidemia   . CHF (congestive heart failure) ([place])   . AORTIC VALVE REPLACEMENT, HX OF 04/27/2007    Qualifier: Diagnosis of  By: Leanne Chang MD, Bruce    . MITRAL VALVE REPLACEMENT, HX OF 04/27/2007    Qualifier: Diagnosis of  By: Leanne Chang MD, Bruce    . Congestive heart failure ([place]) 08/03/2009    [facility] Dr. Paul Half. Last echo 2010 with EF 30%. Restrictive due to radiation. AVR MVR. 02/2014 visit planned.     Patient Active Problem List   Diagnosis Date Noted  . Osteopenia 01/17/2014    Priority: High  . H/O aortic valve replacement and mitral valve replacement. 01/07/2013    Priority: High  . WOLFF (WOLFE)-PARKINSON-WHITE (WPW) SYNDROME 08/16/2009    Priority: High  . Congestive heart failure ([place]) 08/03/2009    Priority: High  . TRANSIENT ISCHEMIC ATTACKS, HX OF 04/27/2007    Priority: High  . CKD (chronic kidney disease), stage III 01/18/2014    Priority: Medium  . Iron deficiency anemia 08/16/2009    Priority: Medium  . Hyperlipidemia 08/29/2008    Priority: Medium  . Hypothyroidism 11/03/2006    Priority: Medium  . COMMON MIGRAINE 11/03/2006    Priority: Medium  . BREAST CANCER, HX OF 11/03/2006    Priority: Medium  . History of Hodgkin's disease 11/03/2006    Priority: Medium  . Long term (current) use of anticoagulants 12/23/2012   Priority: Low  . GASTROINTESTINAL HEMORRHAGE, HX OF 11/03/2006    Priority: Low   Past Surgical History  Procedure Laterality Date  . Endometrial ablation    . Aortic valve surgery    . Mastectomy    . Mitral valve replacement    . Splenectomy    . Thoracotomy    . Valvulopathy  06/13/06  . Pericardial stripping  9/08  . Tonsillectomy    . Bilateral breast implants      Family History  Problem Relation Age of Onset  . Heart disease Mother   . Cancer Mother     breast  . Heart disease Maternal Aunt   . Cancer Maternal Aunt     uterine  . Heart disease Maternal Grandmother   . Diabetes Maternal Grandmother     Medications- reviewed and updated Current Outpatient Prescriptions  Medication Sig Dispense Refill  . aspirin 81 MG tablet Take 81 mg by mouth daily.      . Calcium Carbonate-Vitamin D (CALTRATE 600+D) 600-400 MG-UNIT per tablet Take 2 tablets by mouth daily.     Marland Kitchen docusate sodium (COLACE) 100 MG capsule Take 100 mg by mouth daily.     . furosemide (LASIX) 40 MG tablet Take 1 tablet by mouth  daily 90 tablet 3  . ibandronate (BONIVA) 150 MG tablet Take 1 tab monthly in am  with full glass of water,  on  empty stomach. Do not  anything else by mouth or  lie down for 60 minutes 3 tablet 4  . levothyroxine (SYNTHROID, LEVOTHROID) 125 MCG tablet Take 1 tablet by mouth  daily before breakfast 90 tablet 3  . Magnesium 250 MG TABS Take 1 tablet by mouth daily.    . metoprolol (TOPROL-XL) 50 MG 24 hr tablet Take 50 mg by mouth 2 (two) times daily.     . Multiple Vitamin (MULTIVITAMIN) tablet Take 1 tablet by mouth daily.      . potassium chloride SA (K-DUR,KLOR-CON) 20 MEQ tablet Take 1 tablet by mouth  daily 90 tablet 3  . warfarin (COUMADIN) 1 MG tablet Take as directed 90 tablet 1  . warfarin (COUMADIN) 5 MG tablet Take as directed 105 tablet 3  . spironolactone (ALDACTONE) 25 MG tablet Take 50 mg by mouth daily.      No current facility-administered medications for this  visit.    Allergies-reviewed and updated Allergies  Allergen Reactions  . Statins     myalgia    Social History   Social History  . Marital Status: Married    Spouse Name: N/A  . Number of Children: N/A  . Years of Education: N/A   Social History Main Topics  . Smoking status: Never Smoker   . Smokeless tobacco: Never Used  . Alcohol Use: No  . Drug Use: No  . Sexual Activity: Not Asked   Other Topics Concern  . None   Social History Narrative   Lived in [place] for 18 years. Moved here for the weather.    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      Husband retired January 2015. Married 1991 (2nd marriage). No kids.       Hobbies: walks 2 miles per day, read, work outside          ROS--See HPI   Objective: BP 104/80 mmHg  Pulse 83  Temp(Src) 98.8 F (37.1 C)  Wt 137 lb (62.143 kg) Gen: NAD, resting comfortably, thin HEENT: Mucous membranes are moist. Oropharynx normal Neck: no thyromegaly CV: RRR mechanical click at both s1 and s2, no rubs or gallops Lungs: CTAB no crackles, wheeze, rhonchi Abdomen: soft/nontender/nondistended/normal bowel sounds. No rebound or guarding.  GYN and breast declined as seeing GYN soon Ext: no edema Skin: warm, dry Neuro: grossly normal, moves all extremities, PERRLA  Assessment/Plan:  64 y.o. female presenting for annual physical.  Health Maintenance counseling: 1. Anticipatory guidance: Patient counseled regarding regular dental exams, eye doctor, wearing seatbelts, wearing sunscreen 2. Risk factor reduction:  Advised patient of need for regular exercise and diet rich and fruits and vegetables to reduce risk of heart attack and stroke. Walking 2 miles a day 6 days a week.  3. Immunizations/screenings/ancillary studies Health Maintenance Due  Topic Date Due  . HIV Screening - decline 07/13/1965  . PAP SMEAR - get records  02/25/2015  4. Cervical cancer  screening- Sees GYN next week 5. Breast cancer screening-  breast exam at GYN and mammogram already done September 1st 6. Colon cancer screening - virtual colonoscopy 01/27/2013, we will use stool cards yearly as adjuvant 7. Skin cancer screening- sees Dr. Tonia Brooms later this month  CHF with history AVR and MVR, now with moderate to severe tricuspid regurg. Also WPW syndrome and history of TIA. On coumadin- doing well.  Followed by [facility]. Updated echo showed moderate to severe tricuspid regurgitation- worse than previous-  plan for repeat in 1 year  Hypothyroidism- controlled Lab Results  Component Value Date   TSH 0.40 03/16/2015   Hyperlipidemia- LDL only 101- no statin  CKD III- stable with GFR near 45 (ranges 40-45. BP controlled  Stool cards given virtual colonoscopy 01/27/13   On labs: Leukocytosis- scratchy throat and swollen glands. Likely was elevated due to cold.   Patient requests 6 month labs but states will only see me if has questions/concerns/issues. I advised patient to be seen at that time. Check:TSH, CMET, CBC, vit d, hiv  Osteopenia unclear if Boniva helping at this point until I can look at scanned in report again- My notes show Ap spine improved by 1.1, wherears R femur worsened by 0.4, L femur worsened by 0.2 but want to confirm.    My notes:  03/13/15 R femur neck -2.2, -1.9 L femur neck. AP spine -1.4.  02/08/13. R femur neck -1.8. L femur neck -1.7. AP spine -2.5  must be seen in 1 year  6 month labs Orders Placed This Encounter  Procedures  . Vit D  25 hydroxy (rtn osteoporosis monitoring)    [place]    Standing Status: Future     Number of Occurrences:      Standing Expiration Date: 03/27/2016  . HIV antibody    [facility]    Standing Status: Future     Number of Occurrences:      Standing Expiration Date: 03/27/2016  . CBC with Differential/Platelet    Standing Status: Future     Number of Occurrences:      Standing Expiration Date: 04/26/2016  .  Comprehensive metabolic panel    Standing Status: Future     Number of Occurrences:      Standing Expiration Date: 04/26/2016  . TSH    Standing Status: Future     Number of Occurrences:      Standing Expiration Date: 04/26/2016  . POC Hemoccult Bld/Stl (3-Cd Home Screen)    Send home    Standing Status: Future     Number of Occurrences:      Standing Expiration Date: 03/27/2016

## 2015-04-05 ENCOUNTER — Ambulatory Visit (INDEPENDENT_AMBULATORY_CARE_PROVIDER_SITE_OTHER): Payer: Medicare Other | Admitting: General Practice

## 2015-04-05 ENCOUNTER — Telehealth: Payer: Self-pay

## 2015-04-05 ENCOUNTER — Other Ambulatory Visit: Payer: Medicare Other

## 2015-04-05 DIAGNOSIS — Z952 Presence of prosthetic heart valve: Secondary | ICD-10-CM

## 2015-04-05 DIAGNOSIS — Z8679 Personal history of other diseases of the circulatory system: Secondary | ICD-10-CM

## 2015-04-05 DIAGNOSIS — Z0000 Encounter for general adult medical examination without abnormal findings: Secondary | ICD-10-CM

## 2015-04-05 DIAGNOSIS — Z5181 Encounter for therapeutic drug level monitoring: Secondary | ICD-10-CM

## 2015-04-05 DIAGNOSIS — Z954 Presence of other heart-valve replacement: Secondary | ICD-10-CM

## 2015-04-05 LAB — POCT INR: INR: 2.9

## 2015-04-05 NOTE — Progress Notes (Signed)
Pre visit review using our clinic review tool, if applicable. No additional management support is needed unless otherwise documented below in the visit note. 

## 2015-04-05 NOTE — Telephone Encounter (Signed)
Stool card order entered.  

## 2015-04-05 NOTE — Progress Notes (Signed)
I have reviewed and agree with the plan. 

## 2015-04-06 ENCOUNTER — Encounter: Payer: Self-pay | Admitting: Family Medicine

## 2015-04-10 ENCOUNTER — Other Ambulatory Visit (INDEPENDENT_AMBULATORY_CARE_PROVIDER_SITE_OTHER): Payer: Medicare Other

## 2015-04-10 DIAGNOSIS — Z8719 Personal history of other diseases of the digestive system: Secondary | ICD-10-CM | POA: Diagnosis not present

## 2015-04-10 DIAGNOSIS — Z5181 Encounter for therapeutic drug level monitoring: Secondary | ICD-10-CM

## 2015-04-10 DIAGNOSIS — Z8679 Personal history of other diseases of the circulatory system: Secondary | ICD-10-CM

## 2015-04-10 DIAGNOSIS — Z952 Presence of prosthetic heart valve: Secondary | ICD-10-CM

## 2015-04-10 LAB — HEMOCCULT SLIDES (X 3 CARDS)
Fecal Occult Blood: NEGATIVE
OCCULT 1: NEGATIVE
OCCULT 2: NEGATIVE
OCCULT 3: NEGATIVE
OCCULT 4: NEGATIVE
OCCULT 5: NEGATIVE

## 2015-05-03 ENCOUNTER — Ambulatory Visit (INDEPENDENT_AMBULATORY_CARE_PROVIDER_SITE_OTHER): Payer: Medicare Other | Admitting: General Practice

## 2015-05-03 DIAGNOSIS — Z8679 Personal history of other diseases of the circulatory system: Secondary | ICD-10-CM

## 2015-05-03 DIAGNOSIS — Z954 Presence of other heart-valve replacement: Secondary | ICD-10-CM | POA: Diagnosis not present

## 2015-05-03 DIAGNOSIS — Z952 Presence of prosthetic heart valve: Secondary | ICD-10-CM

## 2015-05-03 DIAGNOSIS — Z5181 Encounter for therapeutic drug level monitoring: Secondary | ICD-10-CM

## 2015-05-03 LAB — POCT INR: INR: 2.1

## 2015-05-03 NOTE — Progress Notes (Signed)
I have reviewed and agree with the plan. 

## 2015-05-03 NOTE — Progress Notes (Signed)
Pre visit review using our clinic review tool, if applicable. No additional management support is needed unless otherwise documented below in the visit note. 

## 2015-05-31 ENCOUNTER — Ambulatory Visit (INDEPENDENT_AMBULATORY_CARE_PROVIDER_SITE_OTHER): Payer: Medicare Other | Admitting: General Practice

## 2015-05-31 DIAGNOSIS — Z5181 Encounter for therapeutic drug level monitoring: Secondary | ICD-10-CM

## 2015-05-31 DIAGNOSIS — Z954 Presence of other heart-valve replacement: Secondary | ICD-10-CM

## 2015-05-31 DIAGNOSIS — Z952 Presence of prosthetic heart valve: Secondary | ICD-10-CM

## 2015-05-31 DIAGNOSIS — Z8679 Personal history of other diseases of the circulatory system: Secondary | ICD-10-CM

## 2015-05-31 LAB — POCT INR: INR: 1.8

## 2015-05-31 NOTE — Progress Notes (Signed)
I have reviewed and agree with the plan. 

## 2015-05-31 NOTE — Progress Notes (Signed)
Pre visit review using our clinic review tool, if applicable. No additional management support is needed unless otherwise documented below in the visit note. 

## 2015-06-21 ENCOUNTER — Ambulatory Visit (INDEPENDENT_AMBULATORY_CARE_PROVIDER_SITE_OTHER): Payer: Medicare Other | Admitting: General Practice

## 2015-06-21 DIAGNOSIS — Z8679 Personal history of other diseases of the circulatory system: Secondary | ICD-10-CM

## 2015-06-21 DIAGNOSIS — Z952 Presence of prosthetic heart valve: Secondary | ICD-10-CM

## 2015-06-21 DIAGNOSIS — Z954 Presence of other heart-valve replacement: Secondary | ICD-10-CM | POA: Diagnosis not present

## 2015-06-21 DIAGNOSIS — Z5181 Encounter for therapeutic drug level monitoring: Secondary | ICD-10-CM

## 2015-06-21 LAB — POCT INR: INR: 2.4

## 2015-06-21 NOTE — Progress Notes (Signed)
Pre visit review using our clinic review tool, if applicable. No additional management support is needed unless otherwise documented below in the visit note. 

## 2015-07-12 ENCOUNTER — Other Ambulatory Visit: Payer: Self-pay | Admitting: Family Medicine

## 2015-07-13 ENCOUNTER — Other Ambulatory Visit: Payer: Self-pay | Admitting: Family Medicine

## 2015-07-19 ENCOUNTER — Ambulatory Visit (INDEPENDENT_AMBULATORY_CARE_PROVIDER_SITE_OTHER): Payer: Medicare Other | Admitting: General Practice

## 2015-07-19 DIAGNOSIS — Z8679 Personal history of other diseases of the circulatory system: Secondary | ICD-10-CM | POA: Diagnosis not present

## 2015-07-19 DIAGNOSIS — Z952 Presence of prosthetic heart valve: Secondary | ICD-10-CM

## 2015-07-19 DIAGNOSIS — Z5181 Encounter for therapeutic drug level monitoring: Secondary | ICD-10-CM | POA: Diagnosis not present

## 2015-07-19 DIAGNOSIS — Z954 Presence of other heart-valve replacement: Secondary | ICD-10-CM

## 2015-07-19 LAB — POCT INR: INR: 2.2

## 2015-07-19 NOTE — Progress Notes (Signed)
Pre visit review using our clinic review tool, if applicable. No additional management support is needed unless otherwise documented below in the visit note. 

## 2015-07-19 NOTE — Progress Notes (Signed)
I have reviewed and agree with the plan. 

## 2015-08-02 ENCOUNTER — Ambulatory Visit (INDEPENDENT_AMBULATORY_CARE_PROVIDER_SITE_OTHER): Payer: Medicare Other | Admitting: General Practice

## 2015-08-02 DIAGNOSIS — Z952 Presence of prosthetic heart valve: Secondary | ICD-10-CM

## 2015-08-02 DIAGNOSIS — Z8679 Personal history of other diseases of the circulatory system: Secondary | ICD-10-CM

## 2015-08-02 DIAGNOSIS — Z5181 Encounter for therapeutic drug level monitoring: Secondary | ICD-10-CM | POA: Diagnosis not present

## 2015-08-02 DIAGNOSIS — Z954 Presence of other heart-valve replacement: Secondary | ICD-10-CM

## 2015-08-02 LAB — POCT INR: INR: 2.9

## 2015-08-02 NOTE — Progress Notes (Signed)
I have reviewed and agree with the plan. 

## 2015-08-02 NOTE — Progress Notes (Signed)
Pre visit review using our clinic review tool, if applicable. No additional management support is needed unless otherwise documented below in the visit note. 

## 2015-08-30 ENCOUNTER — Ambulatory Visit (INDEPENDENT_AMBULATORY_CARE_PROVIDER_SITE_OTHER): Payer: Medicare Other | Admitting: General Practice

## 2015-08-30 DIAGNOSIS — Z952 Presence of prosthetic heart valve: Secondary | ICD-10-CM

## 2015-08-30 DIAGNOSIS — Z5181 Encounter for therapeutic drug level monitoring: Secondary | ICD-10-CM

## 2015-08-30 DIAGNOSIS — Z8679 Personal history of other diseases of the circulatory system: Secondary | ICD-10-CM | POA: Diagnosis not present

## 2015-08-30 DIAGNOSIS — Z954 Presence of other heart-valve replacement: Secondary | ICD-10-CM

## 2015-08-30 LAB — POCT INR: INR: 3.7

## 2015-08-30 NOTE — Progress Notes (Signed)
Pre visit review using our clinic review tool, if applicable. No additional management support is needed unless otherwise documented below in the visit note. 

## 2015-08-30 NOTE — Progress Notes (Signed)
I have reviewed and agree with the plan. 

## 2015-09-27 ENCOUNTER — Ambulatory Visit (INDEPENDENT_AMBULATORY_CARE_PROVIDER_SITE_OTHER): Payer: Medicare Other | Admitting: General Practice

## 2015-09-27 DIAGNOSIS — Z954 Presence of other heart-valve replacement: Secondary | ICD-10-CM

## 2015-09-27 DIAGNOSIS — Z5181 Encounter for therapeutic drug level monitoring: Secondary | ICD-10-CM | POA: Diagnosis not present

## 2015-09-27 DIAGNOSIS — Z8679 Personal history of other diseases of the circulatory system: Secondary | ICD-10-CM | POA: Diagnosis not present

## 2015-09-27 DIAGNOSIS — Z952 Presence of prosthetic heart valve: Secondary | ICD-10-CM

## 2015-09-27 LAB — POCT INR: INR: 3.6

## 2015-09-27 NOTE — Progress Notes (Signed)
Pre visit review using our clinic review tool, if applicable. No additional management support is needed unless otherwise documented below in the visit note. 

## 2015-09-27 NOTE — Progress Notes (Signed)
I have reviewed and agree with the plan. 

## 2015-10-10 ENCOUNTER — Other Ambulatory Visit: Payer: Self-pay | Admitting: Family Medicine

## 2015-10-10 ENCOUNTER — Encounter: Payer: Self-pay | Admitting: Family Medicine

## 2015-10-12 ENCOUNTER — Other Ambulatory Visit: Payer: Self-pay | Admitting: Family Medicine

## 2015-10-12 DIAGNOSIS — E785 Hyperlipidemia, unspecified: Secondary | ICD-10-CM

## 2015-10-12 NOTE — Telephone Encounter (Signed)
I am ok before visit you ordering:  Under hyperlipidemia: cbc diff, cmet, lipid panel, tsh  I do not see she has a history of low vitamin D so that would not be covered unless she wants to pay out of pocket for it. You can try it under osteopenia but i do not think it will accept the code.   Hepatic function tests covered in cmet so not needed.

## 2015-10-16 ENCOUNTER — Ambulatory Visit (INDEPENDENT_AMBULATORY_CARE_PROVIDER_SITE_OTHER): Payer: Medicare Other | Admitting: General Practice

## 2015-10-16 ENCOUNTER — Other Ambulatory Visit (INDEPENDENT_AMBULATORY_CARE_PROVIDER_SITE_OTHER): Payer: Medicare Other

## 2015-10-16 DIAGNOSIS — R946 Abnormal results of thyroid function studies: Secondary | ICD-10-CM

## 2015-10-16 DIAGNOSIS — Z5181 Encounter for therapeutic drug level monitoring: Secondary | ICD-10-CM

## 2015-10-16 DIAGNOSIS — R319 Hematuria, unspecified: Secondary | ICD-10-CM

## 2015-10-16 DIAGNOSIS — I10 Essential (primary) hypertension: Secondary | ICD-10-CM | POA: Diagnosis not present

## 2015-10-16 DIAGNOSIS — Z8679 Personal history of other diseases of the circulatory system: Secondary | ICD-10-CM

## 2015-10-16 DIAGNOSIS — Z954 Presence of other heart-valve replacement: Secondary | ICD-10-CM | POA: Diagnosis not present

## 2015-10-16 DIAGNOSIS — M8580 Other specified disorders of bone density and structure, unspecified site: Secondary | ICD-10-CM

## 2015-10-16 DIAGNOSIS — D649 Anemia, unspecified: Secondary | ICD-10-CM | POA: Diagnosis not present

## 2015-10-16 DIAGNOSIS — Z20828 Contact with and (suspected) exposure to other viral communicable diseases: Secondary | ICD-10-CM

## 2015-10-16 DIAGNOSIS — E785 Hyperlipidemia, unspecified: Secondary | ICD-10-CM

## 2015-10-16 DIAGNOSIS — Z952 Presence of prosthetic heart valve: Secondary | ICD-10-CM

## 2015-10-16 DIAGNOSIS — Z1211 Encounter for screening for malignant neoplasm of colon: Secondary | ICD-10-CM

## 2015-10-16 LAB — POC URINALSYSI DIPSTICK (AUTOMATED)
Bilirubin, UA: NEGATIVE
Glucose, UA: NEGATIVE
Ketones, UA: NEGATIVE
Leukocytes, UA: NEGATIVE
Nitrite, UA: NEGATIVE
Protein, UA: NEGATIVE
Spec Grav, UA: 1.015
Urobilinogen, UA: 0.2
pH, UA: 7

## 2015-10-16 LAB — CBC WITH DIFFERENTIAL/PLATELET
Basophils Absolute: 0.1 10*3/uL (ref 0.0–0.1)
Basophils Relative: 1 % (ref 0.0–3.0)
Eosinophils Absolute: 0.4 10*3/uL (ref 0.0–0.7)
Eosinophils Relative: 5.4 % — ABNORMAL HIGH (ref 0.0–5.0)
HCT: 34 % — ABNORMAL LOW (ref 36.0–46.0)
Hemoglobin: 11.1 g/dL — ABNORMAL LOW (ref 12.0–15.0)
Lymphocytes Relative: 17.8 % (ref 12.0–46.0)
Lymphs Abs: 1.3 10*3/uL (ref 0.7–4.0)
MCHC: 32.5 g/dL (ref 30.0–36.0)
MCV: 90.5 fl (ref 78.0–100.0)
Monocytes Absolute: 1.4 10*3/uL — ABNORMAL HIGH (ref 0.1–1.0)
Monocytes Relative: 18.7 % — ABNORMAL HIGH (ref 3.0–12.0)
Neutro Abs: 4.2 10*3/uL (ref 1.4–7.7)
Neutrophils Relative %: 57.1 % (ref 43.0–77.0)
Platelets: 375 10*3/uL (ref 150.0–400.0)
RBC: 3.76 Mil/uL — ABNORMAL LOW (ref 3.87–5.11)
RDW: 14.6 % (ref 11.5–15.5)
WBC: 7.3 10*3/uL (ref 4.0–10.5)

## 2015-10-16 LAB — COMPREHENSIVE METABOLIC PANEL
ALT: 20 U/L (ref 0–35)
AST: 30 U/L (ref 0–37)
Albumin: 4.2 g/dL (ref 3.5–5.2)
Alkaline Phosphatase: 45 U/L (ref 39–117)
BUN: 21 mg/dL (ref 6–23)
CO2: 30 mEq/L (ref 19–32)
Calcium: 10.2 mg/dL (ref 8.4–10.5)
Chloride: 104 mEq/L (ref 96–112)
Creatinine, Ser: 1.38 mg/dL — ABNORMAL HIGH (ref 0.40–1.20)
GFR: 40.75 mL/min — ABNORMAL LOW (ref >60.00)
Glucose, Bld: 79 mg/dL (ref 70–99)
Potassium: 3.6 mEq/L (ref 3.5–5.1)
Sodium: 141 mEq/L (ref 135–145)
Total Bilirubin: 0.4 mg/dL (ref 0.2–1.2)
Total Protein: 7.1 g/dL (ref 6.0–8.3)

## 2015-10-16 LAB — URINALYSIS, MICROSCOPIC ONLY: RBC / HPF: NONE SEEN (ref 0–?)

## 2015-10-16 LAB — VITAMIN D 25 HYDROXY (VIT D DEFICIENCY, FRACTURES): VITD: 67.67 ng/mL (ref 30.00–100.00)

## 2015-10-16 LAB — POCT INR: INR: 3

## 2015-10-16 LAB — TSH: TSH: 0.34 u[IU]/mL — ABNORMAL LOW (ref 0.35–4.50)

## 2015-10-16 NOTE — Progress Notes (Signed)
Pre visit review using our clinic review tool, if applicable. No additional management support is needed unless otherwise documented below in the visit note. 

## 2015-10-17 LAB — HIV ANTIBODY (ROUTINE TESTING W REFLEX): HIV 1&2 Ab, 4th Generation: NONREACTIVE

## 2015-10-19 ENCOUNTER — Encounter: Payer: Self-pay | Admitting: Family Medicine

## 2015-10-19 ENCOUNTER — Ambulatory Visit (INDEPENDENT_AMBULATORY_CARE_PROVIDER_SITE_OTHER): Payer: Medicare Other | Admitting: Family Medicine

## 2015-10-19 VITALS — BP 110/70 | HR 86 | Ht 66.0 in | Wt 138.0 lb

## 2015-10-19 DIAGNOSIS — E039 Hypothyroidism, unspecified: Secondary | ICD-10-CM

## 2015-10-19 DIAGNOSIS — D649 Anemia, unspecified: Secondary | ICD-10-CM | POA: Diagnosis not present

## 2015-10-19 DIAGNOSIS — I456 Pre-excitation syndrome: Secondary | ICD-10-CM

## 2015-10-19 DIAGNOSIS — Z23 Encounter for immunization: Secondary | ICD-10-CM

## 2015-10-19 DIAGNOSIS — I509 Heart failure, unspecified: Secondary | ICD-10-CM | POA: Diagnosis not present

## 2015-10-19 DIAGNOSIS — N183 Chronic kidney disease, stage 3 unspecified: Secondary | ICD-10-CM

## 2015-10-19 DIAGNOSIS — D509 Iron deficiency anemia, unspecified: Secondary | ICD-10-CM

## 2015-10-19 NOTE — Assessment & Plan Note (Addendum)
WPW  S: continues to follow with [facility]. Visit each October. CHF history with AVR and MVR- still with moderate to severe tricuspid regurg. WPW syndrome. History of TIA. On coumadin. She is still Walking 2 miles 6 days a week- doing well with this. Only gets SOB with more extreme exertion. Had echo in October 2016 and EF was 35%. Compliant with lasix 40mg , spironolactone.  ROS- no chest pain or palpitations, DOE only as noted above A/P: Doing well. No signs fluid overload- no changes in medication. Follow up 6 months.

## 2015-10-19 NOTE — Assessment & Plan Note (Signed)
S: worsening anemia without clear cause. 2014 colonscopy A/P: get stool cards, consider ferritin follow up and iron replacement

## 2015-10-19 NOTE — Assessment & Plan Note (Signed)
S: Lab Results  Component Value Date   TSH 0.34* 10/16/2015   On thyroid medication-levothyroxine 125 mcg ROS-No hair or nail changes. No heat/cold intolerance. No constipation or diarrhea. Denies shakiness or anxiety.  A/P: doing well no signs hyperthyroidism- will not adjust unless low in 6 months.

## 2015-10-19 NOTE — Progress Notes (Signed)
Subjective:  Grace [name] is a 65 y.o. year old very pleasant female patient who presents for/with See problem oriented charting ROS- see any ROS included in HPI as well.   Past Medical History-  Patient Active Problem List   Diagnosis Date Noted  . Osteopenia 01/17/2014    Priority: High  . H/O aortic valve replacement and mitral valve replacement. 01/07/2013    Priority: High  . WOLFF (WOLFE)-PARKINSON-WHITE (WPW) SYNDROME 08/16/2009    Priority: High  . Congestive heart failure ([place]) 08/03/2009    Priority: High  . TRANSIENT ISCHEMIC ATTACKS, HX OF 04/27/2007    Priority: High  . CKD (chronic kidney disease), stage III 01/18/2014    Priority: Medium  . Iron deficiency anemia 08/16/2009    Priority: Medium  . Hyperlipidemia 08/29/2008    Priority: Medium  . Hypothyroidism 11/03/2006    Priority: Medium  . COMMON MIGRAINE 11/03/2006    Priority: Medium  . BREAST CANCER, HX OF 11/03/2006    Priority: Medium  . History of Hodgkin's disease 11/03/2006    Priority: Medium  . Long term (current) use of anticoagulants 12/23/2012    Priority: Low  . GASTROINTESTINAL HEMORRHAGE, HX OF 11/03/2006    Priority: Low    Medications- reviewed and updated Current Outpatient Prescriptions  Medication Sig Dispense Refill  . aspirin 81 MG tablet Take 81 mg by mouth daily.      . Calcium Carbonate-Vitamin D (CALTRATE 600+D) 600-400 MG-UNIT per tablet Take 2 tablets by mouth daily.     Marland Kitchen docusate sodium (COLACE) 100 MG capsule Take 100 mg by mouth daily.     . furosemide (LASIX) 40 MG tablet Take 1 tablet by mouth  daily 90 tablet 2  . ibandronate (BONIVA) 150 MG tablet Take 1 tab monthly in am  with full glass of water,  on empty stomach. Do not  anything else by mouth or  lie down for 60 minutes 3 tablet 4  . levothyroxine (SYNTHROID, LEVOTHROID) 125 MCG tablet Take 1 tablet by mouth  daily before breakfast 90 tablet 2  . Magnesium 250 MG TABS Take 1 tablet by mouth daily.    .  metoprolol (TOPROL-XL) 50 MG 24 hr tablet Take 50 mg by mouth 2 (two) times daily.     . Multiple Vitamin (MULTIVITAMIN) tablet Take 1 tablet by mouth daily.      . potassium chloride SA (K-DUR,KLOR-CON) 20 MEQ tablet Take 1 tablet by mouth  daily 90 tablet 0  . spironolactone (ALDACTONE) 25 MG tablet Take 50 mg by mouth daily.     Marland Kitchen warfarin (COUMADIN) 1 MG tablet Take as directed 90 tablet 1  . warfarin (COUMADIN) 5 MG tablet Take as directed currently: 1 tablet by mouth daily  except 6mg  on Tuesday and  Saturday (Patient taking differently: Take as directed currently: 1 tablet by mouth daily  except 6mg  on Wednesday and  Saturday) 105 tablet 0   No current facility-administered medications for this visit.    Objective: BP 110/70 mmHg  Pulse 86  Ht 5\' 6"  (1.676 m)  Wt 138 lb (62.596 kg)  BMI 22.28 kg/m2  SpO2 98% Gen: NAD, resting comfortably CV: RRR, loud mechanical heart sounds.  Lungs: CTAB no crackles, wheeze, rhonchi Abdomen: soft/nontender/nondistended/normal bowel sounds. No rebound or guarding.  Ext: no edema Skin: warm, dry Neuro: grossly normal, moves all extremities  Assessment/Plan:  Congestive heart failure ([place]) WPW  S: continues to follow with [facility]. Visit each October. CHF history with  AVR and MVR- still with moderate to severe tricuspid regurg. WPW syndrome. History of TIA. On coumadin. She is still Walking 2 miles 6 days a week- doing well with this. Only gets SOB with more extreme exertion. Had echo in October 2016 and EF was 35%. Compliant with lasix 40mg , spironolactone.  ROS- no chest pain or palpitations, DOE only as noted above A/P: Doing well. No signs fluid overload- no changes in medication. Follow up 6 months.   Hypothyroidism S: Lab Results  Component Value Date   TSH 0.34* 10/16/2015   On thyroid medication-levothyroxine 125 mcg ROS-No hair or nail changes. No heat/cold intolerance. No constipation or diarrhea. Denies shakiness or anxiety.  A/P:  doing well no signs hyperthyroidism- will not adjust unless low in 6 months.      CKD (chronic kidney disease), stage III S: GFR stable near 40-45. No nsaids A/P: continue to monitor every 6 months  Iron deficiency anemia S: worsening anemia without clear cause. 2014 colonscopy A/P: get stool cards, consider ferritin follow up and iron replacement    Return in about 6 months (around 04/20/2016) for physical. schedule a welcome to medicare exam 6 months after that. Return precautions advised.   Orders Placed This Encounter  Procedures  . Pneumococcal polysaccharide vaccine 23-valent greater than or equal to 2yo subcutaneous/IM  . POC Hemoccult Bld/Stl (3-Cd Home Screen)    Send home    Standing Status: Future     Number of Occurrences:      Standing Expiration Date: 10/18/2016    Garret Reddish, MD

## 2015-10-19 NOTE — Assessment & Plan Note (Signed)
S: GFR stable near 40-45. No nsaids A/P: continue to monitor every 6 months

## 2015-10-19 NOTE — Patient Instructions (Addendum)
No change in medication  Watch thyroid- may adjust at follow up  Stool cards- please pick up before you leave  With physical- we will check cholesterol- you can send me a message before physical to get set up  Pneumovax 23 before you leave

## 2015-11-15 ENCOUNTER — Ambulatory Visit (INDEPENDENT_AMBULATORY_CARE_PROVIDER_SITE_OTHER): Payer: Medicare Other | Admitting: General Practice

## 2015-11-15 DIAGNOSIS — Z954 Presence of other heart-valve replacement: Secondary | ICD-10-CM | POA: Diagnosis not present

## 2015-11-15 DIAGNOSIS — Z5181 Encounter for therapeutic drug level monitoring: Secondary | ICD-10-CM

## 2015-11-15 DIAGNOSIS — Z8679 Personal history of other diseases of the circulatory system: Secondary | ICD-10-CM | POA: Diagnosis not present

## 2015-11-15 DIAGNOSIS — Z952 Presence of prosthetic heart valve: Secondary | ICD-10-CM

## 2015-11-15 LAB — POCT INR: INR: 4.4

## 2015-11-15 NOTE — Progress Notes (Signed)
I have reviewed and agree with the plan. 

## 2015-11-15 NOTE — Progress Notes (Signed)
Pre visit review using our clinic review tool, if applicable. No additional management support is needed unless otherwise documented below in the visit note. 

## 2015-11-29 ENCOUNTER — Other Ambulatory Visit: Payer: Self-pay | Admitting: Family Medicine

## 2015-12-05 ENCOUNTER — Other Ambulatory Visit (INDEPENDENT_AMBULATORY_CARE_PROVIDER_SITE_OTHER): Payer: Medicare Other

## 2015-12-05 DIAGNOSIS — K921 Melena: Secondary | ICD-10-CM | POA: Diagnosis not present

## 2015-12-05 LAB — HEMOCCULT SLIDES (X 3 CARDS)
Fecal Occult Blood: NEGATIVE
OCCULT 1: NEGATIVE
OCCULT 2: NEGATIVE
OCCULT 3: NEGATIVE
OCCULT 4: NEGATIVE
OCCULT 5: NEGATIVE

## 2015-12-13 ENCOUNTER — Ambulatory Visit (INDEPENDENT_AMBULATORY_CARE_PROVIDER_SITE_OTHER): Payer: Medicare Other | Admitting: General Practice

## 2015-12-13 DIAGNOSIS — Z5181 Encounter for therapeutic drug level monitoring: Secondary | ICD-10-CM | POA: Diagnosis not present

## 2015-12-13 DIAGNOSIS — Z954 Presence of other heart-valve replacement: Secondary | ICD-10-CM

## 2015-12-13 DIAGNOSIS — Z8679 Personal history of other diseases of the circulatory system: Secondary | ICD-10-CM | POA: Diagnosis not present

## 2015-12-13 DIAGNOSIS — Z952 Presence of prosthetic heart valve: Secondary | ICD-10-CM

## 2015-12-13 LAB — POCT INR: INR: 3.7

## 2015-12-13 NOTE — Progress Notes (Signed)
Pre visit review using our clinic review tool, if applicable. No additional management support is needed unless otherwise documented below in the visit note. 

## 2015-12-13 NOTE — Progress Notes (Signed)
I have reviewed and agree with the plan. 

## 2016-01-10 ENCOUNTER — Ambulatory Visit: Payer: Medicare Other

## 2016-01-17 ENCOUNTER — Encounter: Payer: Self-pay | Admitting: Family Medicine

## 2016-01-17 LAB — PROTIME-INR

## 2016-01-31 ENCOUNTER — Encounter: Payer: Self-pay | Admitting: Family Medicine

## 2016-01-31 LAB — HM MAMMOGRAPHY

## 2016-02-07 ENCOUNTER — Ambulatory Visit (INDEPENDENT_AMBULATORY_CARE_PROVIDER_SITE_OTHER): Payer: Medicare Other | Admitting: General Practice

## 2016-02-07 DIAGNOSIS — Z954 Presence of other heart-valve replacement: Secondary | ICD-10-CM

## 2016-02-07 DIAGNOSIS — Z952 Presence of prosthetic heart valve: Secondary | ICD-10-CM

## 2016-02-07 LAB — POCT INR: INR: 3.7

## 2016-02-07 NOTE — Addendum Note (Signed)
Addended by: Meriam Sprague D on: 02/07/2016 10:52 AM   Modules accepted: Orders

## 2016-02-07 NOTE — Progress Notes (Signed)
I have reviewed and agree with the plan. 

## 2016-02-28 ENCOUNTER — Ambulatory Visit (INDEPENDENT_AMBULATORY_CARE_PROVIDER_SITE_OTHER): Payer: Medicare Other | Admitting: General Practice

## 2016-02-28 DIAGNOSIS — Z23 Encounter for immunization: Secondary | ICD-10-CM | POA: Diagnosis not present

## 2016-02-28 DIAGNOSIS — Z952 Presence of prosthetic heart valve: Secondary | ICD-10-CM

## 2016-02-28 DIAGNOSIS — Z5181 Encounter for therapeutic drug level monitoring: Secondary | ICD-10-CM | POA: Diagnosis not present

## 2016-02-28 DIAGNOSIS — Z8679 Personal history of other diseases of the circulatory system: Secondary | ICD-10-CM

## 2016-02-28 LAB — POCT INR: INR: 4.1

## 2016-02-29 ENCOUNTER — Other Ambulatory Visit: Payer: Self-pay | Admitting: Family Medicine

## 2016-03-20 ENCOUNTER — Ambulatory Visit (INDEPENDENT_AMBULATORY_CARE_PROVIDER_SITE_OTHER): Payer: Medicare Other | Admitting: General Practice

## 2016-03-20 DIAGNOSIS — Z952 Presence of prosthetic heart valve: Secondary | ICD-10-CM | POA: Diagnosis not present

## 2016-03-20 LAB — POCT INR: INR: 2.6

## 2016-03-20 NOTE — Progress Notes (Signed)
I have reviewed and agree with the plan. 

## 2016-03-20 NOTE — Patient Instructions (Signed)
Pre visit review using our clinic review tool, if applicable. No additional management support is needed unless otherwise documented below in the visit note. 

## 2016-04-03 ENCOUNTER — Ambulatory Visit (INDEPENDENT_AMBULATORY_CARE_PROVIDER_SITE_OTHER): Payer: Medicare Other | Admitting: General Practice

## 2016-04-03 DIAGNOSIS — Z8679 Personal history of other diseases of the circulatory system: Secondary | ICD-10-CM

## 2016-04-03 DIAGNOSIS — Z952 Presence of prosthetic heart valve: Secondary | ICD-10-CM | POA: Diagnosis not present

## 2016-04-03 DIAGNOSIS — Z5181 Encounter for therapeutic drug level monitoring: Secondary | ICD-10-CM

## 2016-04-03 LAB — POCT INR: INR: 2.2

## 2016-04-03 NOTE — Progress Notes (Signed)
I have reviewed and agree with the plan. 

## 2016-04-03 NOTE — Patient Instructions (Signed)
Pre visit review using our clinic review tool, if applicable. No additional management support is needed unless otherwise documented below in the visit note. 

## 2016-04-08 ENCOUNTER — Encounter: Payer: Self-pay | Admitting: Family Medicine

## 2016-04-09 ENCOUNTER — Other Ambulatory Visit (INDEPENDENT_AMBULATORY_CARE_PROVIDER_SITE_OTHER): Payer: Medicare Other

## 2016-04-09 DIAGNOSIS — D649 Anemia, unspecified: Secondary | ICD-10-CM | POA: Diagnosis not present

## 2016-04-09 DIAGNOSIS — E785 Hyperlipidemia, unspecified: Secondary | ICD-10-CM | POA: Diagnosis not present

## 2016-04-09 LAB — CBC WITH DIFFERENTIAL/PLATELET
Basophils Absolute: 0.1 10*3/uL (ref 0.0–0.1)
Basophils Relative: 1.4 % (ref 0.0–3.0)
Eosinophils Absolute: 0.4 10*3/uL (ref 0.0–0.7)
Eosinophils Relative: 5.8 % — ABNORMAL HIGH (ref 0.0–5.0)
HCT: 34.3 % — ABNORMAL LOW (ref 36.0–46.0)
Hemoglobin: 11.3 g/dL — ABNORMAL LOW (ref 12.0–15.0)
Lymphocytes Relative: 22.5 % (ref 12.0–46.0)
Lymphs Abs: 1.4 10*3/uL (ref 0.7–4.0)
MCHC: 32.8 g/dL (ref 30.0–36.0)
MCV: 91.5 fl (ref 78.0–100.0)
Monocytes Absolute: 0.9 10*3/uL (ref 0.1–1.0)
Monocytes Relative: 14.5 % — ABNORMAL HIGH (ref 3.0–12.0)
Neutro Abs: 3.4 10*3/uL (ref 1.4–7.7)
Neutrophils Relative %: 55.8 % (ref 43.0–77.0)
Platelets: 373 10*3/uL (ref 150.0–400.0)
RBC: 3.75 Mil/uL — ABNORMAL LOW (ref 3.87–5.11)
RDW: 14.1 % (ref 11.5–15.5)
WBC: 6.2 10*3/uL (ref 4.0–10.5)

## 2016-04-09 LAB — POC URINALSYSI DIPSTICK (AUTOMATED)
Bilirubin, UA: NEGATIVE
Blood, UA: NEGATIVE
Glucose, UA: NEGATIVE
Ketones, UA: NEGATIVE
Leukocytes, UA: NEGATIVE
Nitrite, UA: NEGATIVE
Protein, UA: NEGATIVE
Spec Grav, UA: 1.02
Urobilinogen, UA: 0.2
pH, UA: 5.5

## 2016-04-09 LAB — LIPID PANEL
Cholesterol: 188 mg/dL (ref 0–200)
HDL: 57.5 mg/dL (ref >39.00)
LDL Cholesterol: 107 mg/dL — ABNORMAL HIGH (ref 0–99)
NonHDL: 130.28
Total CHOL/HDL Ratio: 3
Triglycerides: 114 mg/dL (ref 0.0–149.0)
VLDL: 22.8 mg/dL (ref 0.0–40.0)

## 2016-04-09 LAB — HEPATIC FUNCTION PANEL
ALT: 22 U/L (ref 0–35)
AST: 34 U/L (ref 0–37)
Albumin: 4.3 g/dL (ref 3.5–5.2)
Alkaline Phosphatase: 53 U/L (ref 39–117)
Bilirubin, Direct: 0.1 mg/dL (ref 0.0–0.3)
Total Bilirubin: 0.3 mg/dL (ref 0.2–1.2)
Total Protein: 7.7 g/dL (ref 6.0–8.3)

## 2016-04-09 LAB — TSH: TSH: 0.45 u[IU]/mL (ref 0.35–4.50)

## 2016-04-15 ENCOUNTER — Encounter: Payer: Self-pay | Admitting: Family Medicine

## 2016-04-15 ENCOUNTER — Ambulatory Visit (INDEPENDENT_AMBULATORY_CARE_PROVIDER_SITE_OTHER): Payer: Medicare Other | Admitting: Family Medicine

## 2016-04-15 ENCOUNTER — Ambulatory Visit (INDEPENDENT_AMBULATORY_CARE_PROVIDER_SITE_OTHER): Payer: Medicare Other | Admitting: General Practice

## 2016-04-15 VITALS — BP 136/58 | HR 76 | Temp 97.7°F | Ht 66.0 in | Wt 125.6 lb

## 2016-04-15 DIAGNOSIS — Z952 Presence of prosthetic heart valve: Secondary | ICD-10-CM | POA: Diagnosis not present

## 2016-04-15 DIAGNOSIS — I5022 Chronic systolic (congestive) heart failure: Secondary | ICD-10-CM | POA: Diagnosis not present

## 2016-04-15 DIAGNOSIS — Z8679 Personal history of other diseases of the circulatory system: Secondary | ICD-10-CM

## 2016-04-15 DIAGNOSIS — Z0000 Encounter for general adult medical examination without abnormal findings: Secondary | ICD-10-CM | POA: Diagnosis not present

## 2016-04-15 DIAGNOSIS — Z5181 Encounter for therapeutic drug level monitoring: Secondary | ICD-10-CM | POA: Diagnosis not present

## 2016-04-15 LAB — POCT INR: INR: 2.8

## 2016-04-15 NOTE — Progress Notes (Signed)
Pre visit review using our clinic review tool, if applicable. No additional management support is needed unless otherwise documented below in the visit note. 

## 2016-04-15 NOTE — Patient Instructions (Signed)
Pre visit review using our clinic review tool, if applicable. No additional management support is needed unless otherwise documented below in the visit note. 

## 2016-04-15 NOTE — Progress Notes (Signed)
Phone: [phone]  Subjective:  Patient presents today for their annual physical. Chief complaint-noted.   See problem oriented charting- ROS- full  review of systems was completed and negative except for: shortness of breath beyond a basic walk if really pushes herself  The following were reviewed and entered/updated in epic: Past Medical History:  Diagnosis Date  . Anemia   . Aortic insufficiency   . AORTIC VALVE REPLACEMENT, HX OF 04/27/2007   Qualifier: Diagnosis of  By: Leanne Chang MD, Bruce    . Cancer (HCC)    breast  . CHF (congestive heart failure) ([place])   . Congestive heart failure ([place]) 08/03/2009   [facility] Dr. Paul Half. Last echo 2010 with EF 30%. Restrictive due to radiation. AVR MVR. 02/2014 visit planned.    . GI bleed   . Hodgkin's disease   . Hyperlipidemia   . Hypothyroidism   . Migraine   . MITRAL VALVE REPLACEMENT, HX OF 04/27/2007   Qualifier: Diagnosis of  By: Leanne Chang MD, Bruce    . WPW (Wolff-Parkinson-White syndrome)    Patient Active Problem List   Diagnosis Date Noted  . Osteopenia 01/17/2014    Priority: High  . H/O aortic valve replacement and mitral valve replacement. 01/07/2013    Priority: High  . WOLFF (WOLFE)-PARKINSON-WHITE (WPW) SYNDROME 08/16/2009    Priority: High  . Congestive heart failure ([place]) 08/03/2009    Priority: High  . TRANSIENT ISCHEMIC ATTACKS, HX OF 04/27/2007    Priority: High  . CKD (chronic kidney disease), stage III 01/18/2014    Priority: Medium  . Iron deficiency anemia 08/16/2009    Priority: Medium  . Hyperlipidemia 08/29/2008    Priority: Medium  . Hypothyroidism 11/03/2006    Priority: Medium  . COMMON MIGRAINE 11/03/2006    Priority: Medium  . BREAST CANCER, HX OF 11/03/2006    Priority: Medium  . History of Hodgkin's disease 11/03/2006    Priority: Medium  . Long term (current) use of anticoagulants 12/23/2012    Priority: Low  . GASTROINTESTINAL HEMORRHAGE, HX OF 11/03/2006    Priority: Low   Past  Surgical History:  Procedure Laterality Date  . AORTIC VALVE SURGERY    . bilateral breast implants    . ENDOMETRIAL ABLATION    . MASTECTOMY    . MITRAL VALVE REPLACEMENT    . pericardial stripping  9/08  . SPLENECTOMY    . THORACOTOMY    . TONSILLECTOMY    . valvulopathy  06/13/06    Family History  Problem Relation Age of Onset  . Heart disease Mother   . Cancer Mother     breast  . Heart disease Maternal Aunt   . Cancer Maternal Aunt     uterine  . Heart disease Maternal Grandmother   . Diabetes Maternal Grandmother     Medications- reviewed and updated Current Outpatient Prescriptions  Medication Sig Dispense Refill  . aspirin 81 MG tablet Take 81 mg by mouth daily.      . Calcium Carbonate-Vitamin D (CALTRATE 600+D) 600-400 MG-UNIT per tablet Take 2 tablets by mouth daily.     Marland Kitchen docusate sodium (COLACE) 100 MG capsule Take 100 mg by mouth daily.     . furosemide (LASIX) 40 MG tablet Take 1 tablet by mouth  daily 90 tablet 2  . ibandronate (BONIVA) 150 MG tablet TAKE 1 TABLET BY MOUTH  MONTHLY IN MORNING WITH  FULL GLASS WATER ON EMPTY  STOMACH. NOTHING ELSE BY  MOUTH/LIE DOWN 1 HOUR 3 tablet  3  . levothyroxine (SYNTHROID, LEVOTHROID) 125 MCG tablet Take 1 tablet by mouth  daily before breakfast 90 tablet 2  . Magnesium 250 MG TABS Take 1 tablet by mouth daily.    . metoprolol (TOPROL-XL) 50 MG 24 hr tablet Take 50 mg by mouth 2 (two) times daily.     . Multiple Vitamin (MULTIVITAMIN) tablet Take 1 tablet by mouth daily.      . potassium chloride SA (K-DUR,KLOR-CON) 20 MEQ tablet Take 1 tablet by mouth  daily 90 tablet 3  . spironolactone (ALDACTONE) 25 MG tablet Take 50 mg by mouth daily.     Marland Kitchen warfarin (COUMADIN) 1 MG tablet Take as directed 90 tablet 1  . warfarin (COUMADIN) 5 MG tablet Take as directed currently: 1 tablet by mouth daily  except 6mg  on Tuesday and  Saturday (Patient taking differently: Take as directed currently: 1 tablet by mouth daily  except 6mg  on  Wednesday and  Saturday) 105 tablet 0   No current facility-administered medications for this visit.     Allergies-reviewed and updated Allergies  Allergen Reactions  . Statins     myalgia    Social History   Social History  . Marital status: Married    Spouse name: N/A  . Number of children: N/A  . Years of education: N/A   Social History Main Topics  . Smoking status: Never Smoker  . Smokeless tobacco: Never Used  . Alcohol use No  . Drug use: No  . Sexual activity: Not Asked   Other Topics Concern  . None   Social History Narrative   Lived in [place] for 18 years. Moved here for the weather.    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      Husband retired January 2015. Married 1991 (2nd marriage). No kids.       Hobbies: walks 2 miles per day, read, work outside          Objective: BP (!) 136/58 (BP Location: Left Arm, Cuff Size: Normal)   Pulse 76   Temp 97.7 F (36.5 C) (Oral)   Ht 5\' 6"  (1.676 m)   Wt 125 lb 9.6 oz (57 kg)   SpO2 98%   BMI 20.27 kg/m  Gen: NAD, resting comfortably HEENT: Mucous membranes are moist. Oropharynx normal Neck: no thyromegaly CV: RRR no murmurs rubs or gallops Lungs: CTAB no crackles, wheeze, rhonchi Abdomen: soft/nontender/nondistended/normal bowel sounds. No rebound or guarding.  Ext: no edema Skin: warm, dry Neuro: grossly normal, moves all extremities, PERRLA  Assessment/Plan:  65 y.o. female presenting for annual physical.  Health Maintenance counseling: 1. Anticipatory guidance: Patient counseled regarding regular dental exams, eye exams, wearing seatbelts.  2. Risk factor reduction:  Advised patient of need for regular exercise and diet rich and fruits and vegetables to reduce risk of heart attack and stroke. Lost weight after loss of mother- need to monitor at follow up 2.5 months- was so busy states just did not eat. Not exercising- going to start  her walking again 3. Immunizations/screenings/ancillary studies Immunization History  Administered Date(s) Administered  . Influenza Split 03/21/2011, 03/24/2012  . Influenza Whole 03/16/2008, 04/27/2009, 03/01/2010  . Influenza, High Dose Seasonal PF 02/28/2016  . Influenza,inj,Quad PF,36+ Mos 03/12/2013  . Influenza-Unspecified 02/24/2014, 03/14/2015  . Pneumococcal Conjugate-13 03/26/2013  . Pneumococcal Polysaccharide-23 02/25/2003, 10/19/2015  . Td 06/24/2002  . Tdap 03/26/2013  . Zoster 04/24/2011  4. Cervical cancer screening- is  going to call gynecology about update- last 3 years ago.  5. Breast cancer screening- bilateral mastectomies after breast cancer 1991 in left. Still does exam with gynecology 6. Colon cancer screening - 2014  With 10 year repeat 7. Skin cancer screening- asks my opinion- considering [facility], Dr. Allyson Sabal or [facility]- prior [facility]uber  Status of chronic or acute concerns   CV- known history of CHF and WPW. Has also had mechanical AV and MV replacement- continues to have moderate to severe tricuspid regurgitation.  S: follows with [facility]- just saw in October. History of TIA. On coumadin for valve replacement. continues to get SOb with mores intense xertion but walks 2 miles 6 days a week and does well (slacking recently). On lasix 40mg , spironolactone. EF 35% (noted nonischemic despite her CAD history- thought restrictive due to prior radiation) October 2016- also on metoprolol, spironolactone on this. Has not tolerated ACEi with hypotension.  A/P: no signs fluid overload, each condition appears well controlled. No change to medicine.   HLD S: intolerant to statins and zetia expensive. Cardiology consider PCSK9 therapy with history of CAD  A/P: to discuss next October with cardiology  BP controlled- at home has been lower- likely at goal <130/80 but higher stress time fo rher with holidays BP Readings from Last 3 Encounters:  04/15/16 (!)  136/58  10/19/15 110/70  03/28/15 104/80   S: hypothyroidism Lab Results  Component Value Date   TSH 0.45 04/09/2016   On thyroid medication-levothyroxine 125 mcg A/P: continue current medicines  CKD III S: GFR stable in 40s. No nsaids. Creatinine at [facility] did go up to 1.7 on labs at [facility]  A/P: monitor every 6 months usually but with increased creatinine next visit- likely repeat at welcome to medicare exam  Anemia S: largely stable anemia. Colonoscopy 2014. Last visit we planned on stool cards  which were negative x5 A/P:  Continue to monitor- stable since radiation years ago she believes in 11-12 range  Osteoporosis  S: on boniva since 01/2014 with 2 year repeat planned. Actually had this 03/13/15 with [facility]- showed mild worsening worst t score -2.2   67.7 vitD- check yearly with osteoporosis A/P: continue current meds- likely repeat 2018  Welcome to medicare before her birthday. GET BMET at that time. Vit D following visit  Return precautions advised.   Garret Reddish, MD

## 2016-04-15 NOTE — Patient Instructions (Addendum)
Call GYN to figure out about pap smear- if they do another have them send me a copy  Let me know if you need a referral for dermatology- happy to provide if needed  Glad appetite is doing better  See me back before your birthday in a 30 minute slot for a welcome to medicare visit. We will have a long list of questions for you so try to arrive at least 15 minutes early.

## 2016-04-24 LAB — HM PAP SMEAR

## 2016-04-25 ENCOUNTER — Encounter: Payer: Self-pay | Admitting: Family Medicine

## 2016-05-08 ENCOUNTER — Ambulatory Visit (INDEPENDENT_AMBULATORY_CARE_PROVIDER_SITE_OTHER): Payer: Medicare Other | Admitting: General Practice

## 2016-05-08 DIAGNOSIS — Z952 Presence of prosthetic heart valve: Secondary | ICD-10-CM

## 2016-05-08 DIAGNOSIS — Z5181 Encounter for therapeutic drug level monitoring: Secondary | ICD-10-CM

## 2016-05-08 DIAGNOSIS — Z8679 Personal history of other diseases of the circulatory system: Secondary | ICD-10-CM

## 2016-05-08 LAB — POCT INR: INR: 3.2

## 2016-05-08 NOTE — Progress Notes (Signed)
I have reviewed and agree with the plan. 

## 2016-05-08 NOTE — Patient Instructions (Signed)
Pre visit review using our clinic review tool, if applicable. No additional management support is needed unless otherwise documented below in the visit note. 

## 2016-06-05 ENCOUNTER — Ambulatory Visit (INDEPENDENT_AMBULATORY_CARE_PROVIDER_SITE_OTHER): Payer: Medicare Other | Admitting: General Practice

## 2016-06-05 DIAGNOSIS — Z952 Presence of prosthetic heart valve: Secondary | ICD-10-CM | POA: Diagnosis not present

## 2016-06-05 DIAGNOSIS — Z8679 Personal history of other diseases of the circulatory system: Secondary | ICD-10-CM

## 2016-06-05 DIAGNOSIS — Z5181 Encounter for therapeutic drug level monitoring: Secondary | ICD-10-CM

## 2016-06-05 LAB — POCT INR: INR: 3.2

## 2016-06-05 NOTE — Progress Notes (Signed)
I have reviewed and agree with the plan. 

## 2016-06-05 NOTE — Patient Instructions (Signed)
Pre visit review using our clinic review tool, if applicable. No additional management support is needed unless otherwise documented below in the visit note. 

## 2016-06-26 ENCOUNTER — Encounter: Payer: Self-pay | Admitting: Family Medicine

## 2016-06-26 ENCOUNTER — Ambulatory Visit (INDEPENDENT_AMBULATORY_CARE_PROVIDER_SITE_OTHER): Payer: Medicare Other | Admitting: Family Medicine

## 2016-06-26 VITALS — BP 120/82 | HR 82 | Temp 97.6°F | Ht 66.0 in | Wt 128.4 lb

## 2016-06-26 DIAGNOSIS — Z952 Presence of prosthetic heart valve: Secondary | ICD-10-CM

## 2016-06-26 DIAGNOSIS — N183 Chronic kidney disease, stage 3 unspecified: Secondary | ICD-10-CM

## 2016-06-26 DIAGNOSIS — Z0000 Encounter for general adult medical examination without abnormal findings: Secondary | ICD-10-CM

## 2016-06-26 DIAGNOSIS — I5022 Chronic systolic (congestive) heart failure: Secondary | ICD-10-CM

## 2016-06-26 DIAGNOSIS — I456 Pre-excitation syndrome: Secondary | ICD-10-CM

## 2016-06-26 DIAGNOSIS — M810 Age-related osteoporosis without current pathological fracture: Secondary | ICD-10-CM | POA: Diagnosis not present

## 2016-06-26 DIAGNOSIS — E785 Hyperlipidemia, unspecified: Secondary | ICD-10-CM | POA: Diagnosis not present

## 2016-06-26 DIAGNOSIS — E039 Hypothyroidism, unspecified: Secondary | ICD-10-CM | POA: Diagnosis not present

## 2016-06-26 LAB — CBC WITH DIFFERENTIAL/PLATELET
Basophils Absolute: 0.1 10*3/uL (ref 0.0–0.1)
Basophils Relative: 1.5 % (ref 0.0–3.0)
Eosinophils Absolute: 0.3 10*3/uL (ref 0.0–0.7)
Eosinophils Relative: 3.4 % (ref 0.0–5.0)
HCT: 33 % — ABNORMAL LOW (ref 36.0–46.0)
Hemoglobin: 11 g/dL — ABNORMAL LOW (ref 12.0–15.0)
Lymphocytes Relative: 14.2 % (ref 12.0–46.0)
Lymphs Abs: 1.3 10*3/uL (ref 0.7–4.0)
MCHC: 33.4 g/dL (ref 30.0–36.0)
MCV: 91.5 fl (ref 78.0–100.0)
Monocytes Absolute: 1.4 10*3/uL — ABNORMAL HIGH (ref 0.1–1.0)
Monocytes Relative: 14.8 % — ABNORMAL HIGH (ref 3.0–12.0)
Neutro Abs: 6.1 10*3/uL (ref 1.4–7.7)
Neutrophils Relative %: 66.1 % (ref 43.0–77.0)
Platelets: 467 10*3/uL — ABNORMAL HIGH (ref 150.0–400.0)
RBC: 3.6 Mil/uL — ABNORMAL LOW (ref 3.87–5.11)
RDW: 13.9 % (ref 11.5–15.5)
WBC: 9.2 10*3/uL (ref 4.0–10.5)

## 2016-06-26 LAB — COMPREHENSIVE METABOLIC PANEL
ALT: 24 U/L (ref 0–35)
AST: 33 U/L (ref 0–37)
Albumin: 4.4 g/dL (ref 3.5–5.2)
Alkaline Phosphatase: 69 U/L (ref 39–117)
BUN: 25 mg/dL — ABNORMAL HIGH (ref 6–23)
CO2: 33 mEq/L — ABNORMAL HIGH (ref 19–32)
Calcium: 10.4 mg/dL (ref 8.4–10.5)
Chloride: 101 mEq/L (ref 96–112)
Creatinine, Ser: 1.68 mg/dL — ABNORMAL HIGH (ref 0.40–1.20)
GFR: 32.4 mL/min — ABNORMAL LOW (ref >60.00)
Glucose, Bld: 83 mg/dL (ref 70–99)
Potassium: 3.9 mEq/L (ref 3.5–5.1)
Sodium: 142 mEq/L (ref 135–145)
Total Bilirubin: 0.3 mg/dL (ref 0.2–1.2)
Total Protein: 7.6 g/dL (ref 6.0–8.3)

## 2016-06-26 LAB — LIPID PANEL
Cholesterol: 210 mg/dL — ABNORMAL HIGH (ref 0–200)
HDL: 64.2 mg/dL (ref >39.00)
LDL Cholesterol: 125 mg/dL — ABNORMAL HIGH (ref 0–99)
NonHDL: 145.41
Total CHOL/HDL Ratio: 3
Triglycerides: 103 mg/dL (ref 0.0–149.0)
VLDL: 20.6 mg/dL (ref 0.0–40.0)

## 2016-06-26 LAB — TSH: TSH: 0.57 u[IU]/mL (ref 0.35–4.50)

## 2016-06-26 LAB — VITAMIN D 25 HYDROXY (VIT D DEFICIENCY, FRACTURES): VITD: 52.49 ng/mL (ref 30.00–100.00)

## 2016-06-26 NOTE — Patient Instructions (Addendum)
Vision test before you go  Labs after vision test.   See me between now and your November visit with cardiology then we will return to yearly physicals after that unless you need [place] sooner. That way you can be seen every 6 months. Could consider doing annual wellness visits with our nurse in between these physician visits as well for another chance to check in.

## 2016-06-26 NOTE — Progress Notes (Signed)
Phone: [phone]  Subjective:  Patient presents today for their annual wellness visit.    Preventive Screening-Counseling & Management  Advanced directives discussion: Full code. Would not want to be in vegetative state.  Vision wears bifocal contacts: vision 20/20 R/L/Bilateral  Smoking Status: Never Smoker Second Hand Smoking status: No smokers in home  Risk Factors Regular exercise: walks on daily basis as long as weather not cold Diet: reasonable, weight up a few lbs which is good as she is on thin side  Fall Risk: None   Cardiac risk factors:  advanced age (older than 36 for men, 11 for women)  Hyperlipidemia , yes but statin intolerant Lab Results  Component Value Date   CHOL 188 04/09/2016   HDL 57.50 04/09/2016   LDLCALC 107 (H) 04/09/2016   LDLDIRECT 101.0 03/16/2015   TRIG 114.0 04/09/2016   CHOLHDL 3 04/09/2016  No hypertension No diabetes.   Known cardiac disease listed below CHF valve replacement as well  Depression Screen None. PHQ2 0 today  Activities of Daily Living Independent ADLs and IADLs   Hearing Difficulties: -patient declines  Cognitive Testing No reported trouble. States even since she was young has lost things but no worsening.   Normal 3 word recall  List the Names of Other Physician/Practitioners you currently use: -Dr. Paul Half cardiology 02/2016 Allyson Sabal dermatology sees PA Ms. [facility] - Gynecology sees PA Ms. Lane - [facility] scheduled 06/2016  Immunization History  Administered Date(s) Administered  . Influenza Split 03/21/2011, 03/24/2012  . Influenza Whole 03/16/2008, 04/27/2009, 03/01/2010  . Influenza, High Dose Seasonal PF 02/28/2016  . Influenza,inj,Quad PF,36+ Mos 03/12/2013  . Influenza-Unspecified 02/24/2014, 03/14/2015  . Pneumococcal Conjugate-13 03/26/2013  . Pneumococcal Polysaccharide-23 02/25/2003, 10/19/2015  . Td 06/24/2002  . Tdap 03/26/2013  . Zoster 04/24/2011   Required Immunizations  needed today: no but discussed shingrix option likely for next year   Screening tests- up to date Bilateral mastectomies for breast cancer- did mammogram 01/31/16 Pap done 04/24/16 with 3 year repeat 01/2013 colonoscopy with 10 year repeat DEXA 02/2015  ROS- No pertinent positives discovered in course of AWV ROS- full ROS completed and negative except does get some SOB with stairs but stable , no chest pain. Some fatigue but not worsening.   The following were reviewed and entered/updated in epic: Past Medical History:  Diagnosis Date  . Anemia   . Aortic insufficiency   . AORTIC VALVE REPLACEMENT, HX OF 04/27/2007   Qualifier: Diagnosis of  By: Leanne Chang MD, Bruce    . Cancer (HCC)    breast  . CHF (congestive heart failure) ([place])   . Congestive heart failure ([place]) 08/03/2009   [facility] Dr. Paul Half. Last echo 2010 with EF 30%. Restrictive due to radiation. AVR MVR. 02/2014 visit planned.    . GI bleed   . Hodgkin's disease   . Hyperlipidemia   . Hypothyroidism   . Migraine   . MITRAL VALVE REPLACEMENT, HX OF 04/27/2007   Qualifier: Diagnosis of  By: Leanne Chang MD, Bruce    . WPW (Wolff-Parkinson-White syndrome)    Patient Active Problem List   Diagnosis Date Noted  . Osteoporosis. check vitamin D yearly. 01/17/2014    Priority: High  . H/O aortic valve replacement and mitral valve replacement. 01/07/2013    Priority: High  . WOLFF (WOLFE)-PARKINSON-WHITE (WPW) SYNDROME 08/16/2009    Priority: High  . Congestive heart failure ([place]) 08/03/2009    Priority: High  . TRANSIENT ISCHEMIC ATTACKS, HX OF 04/27/2007  Priority: High  . CKD (chronic kidney disease), stage III 01/18/2014    Priority: Medium  . Iron deficiency anemia 08/16/2009    Priority: Medium  . Hyperlipidemia 08/29/2008    Priority: Medium  . Hypothyroidism 11/03/2006    Priority: Medium  . COMMON MIGRAINE 11/03/2006    Priority: Medium  . BREAST CANCER, HX OF 11/03/2006    Priority: Medium  . History of Hodgkin's  disease 11/03/2006    Priority: Medium  . Long term (current) use of anticoagulants 12/23/2012    Priority: Low  . GASTROINTESTINAL HEMORRHAGE, HX OF 11/03/2006    Priority: Low   Past Surgical History:  Procedure Laterality Date  . AORTIC VALVE SURGERY    . bilateral breast implants    . ENDOMETRIAL ABLATION    . MASTECTOMY    . MITRAL VALVE REPLACEMENT    . pericardial stripping  9/08  . SPLENECTOMY    . THORACOTOMY    . TONSILLECTOMY    . valvulopathy  06/13/06    Family History  Problem Relation Age of Onset  . Heart disease Mother   . Cancer Mother     breast  . Heart disease Maternal Aunt   . Cancer Maternal Aunt     uterine  . Heart disease Maternal Grandmother   . Diabetes Maternal Grandmother     Medications- reviewed and updated Current Outpatient Prescriptions  Medication Sig Dispense Refill  . aspirin 81 MG tablet Take 81 mg by mouth daily.      Marland Kitchen aspirin-acetaminophen-caffeine (EXCEDRIN MIGRAINE) 250-250-65 MG tablet Take 2 tablets by mouth every 6 (six) hours as needed for headache.    . Calcium Carbonate-Vitamin D (CALTRATE 600+D) 600-400 MG-UNIT per tablet Take 2 tablets by mouth daily.     . diphenhydramine-acetaminophen (TYLENOL PM) 25-500 MG TABS tablet Take 1 tablet by mouth at bedtime as needed.    . docusate sodium (COLACE) 100 MG capsule Take 100 mg by mouth daily.     . furosemide (LASIX) 40 MG tablet Take 1 tablet by mouth  daily 90 tablet 2  . ibandronate (BONIVA) 150 MG tablet TAKE 1 TABLET BY MOUTH  MONTHLY IN MORNING WITH  FULL GLASS WATER ON EMPTY  STOMACH. NOTHING ELSE BY  MOUTH/LIE DOWN 1 HOUR 3 tablet 3  . levothyroxine (SYNTHROID, LEVOTHROID) 125 MCG tablet Take 1 tablet by mouth  daily before breakfast 90 tablet 2  . Magnesium 250 MG TABS Take 1 tablet by mouth daily.    . metoprolol (TOPROL-XL) 50 MG 24 hr tablet Take 50 mg by mouth 2 (two) times daily.     . Multiple Vitamin (MULTIVITAMIN) tablet Take 1 tablet by mouth daily.      .  potassium chloride SA (K-DUR,KLOR-CON) 20 MEQ tablet Take 1 tablet by mouth  daily 90 tablet 3  . spironolactone (ALDACTONE) 25 MG tablet Take 50 mg by mouth daily.     Marland Kitchen warfarin (COUMADIN) 1 MG tablet Take as directed 90 tablet 1  . warfarin (COUMADIN) 5 MG tablet Take as directed currently: 1 tablet by mouth daily  except 6mg  on Tuesday and  Saturday (Patient taking differently: Take as directed currently: 1 tablet by mouth daily  except 7.5mg  on Monday) 105 tablet 0   No current facility-administered medications for this visit.     Allergies-reviewed and updated Allergies  Allergen Reactions  . Statins     myalgia    Social History   Social History  . Marital status: Married  Spouse name: N/A  . Number of children: N/A  . Years of education: N/A   Social History Main Topics  . Smoking status: Never Smoker  . Smokeless tobacco: Never Used  . Alcohol use No  . Drug use: No  . Sexual activity: Not Asked   Other Topics Concern  . None   Social History Narrative   Lived in [place] for 18 years. Moved here for the weather.    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      Husband retired January 2015. Married 1991 (2nd marriage). No kids.       Hobbies: walks 2 miles per day, read, work outside          Objective: BP 120/82 (BP Location: Left Arm, Patient Position: Sitting, Cuff Size: Normal)   Pulse 82   Temp 97.6 F (36.4 C) (Oral)   Ht 5\' 6"  (1.676 m)   Wt 128 lb 6.4 oz (58.2 kg)   SpO2 98%   BMI 20.72 kg/m  Gen: NAD, resting comfortably HEENT: Mucous membranes are moist. Oropharynx normal Neck: no thyromegaly CV: RRR no murmurs rubs or gallops Lungs: CTAB no crackles, wheeze, rhonchi Abdomen: soft/nontender/nondistended/normal bowel sounds. No rebound or guarding.  Ext: no edema Skin: warm, dry Neuro: grossly normal, moves all extremities, PERRLA  Assessment/Plan:  AWV completed-  discussed recommended screenings anddocumented any personalized health advice and referrals for preventive counseling. See AVS as well which was given to patient.   Status of chronic or acute concerns  Followed by [facility] cardiology for [facility] parkinson white syndrome  History of aortic valve and mitral valve replacement mechanical- on coumadin. History of Gi hemorrhage in 990s but none recently. Had unknown cause of iron deficiency anemia- has had normal colonoscopies and GI workup. MV with iron. Cards also has her on aspirin   CHF- restrictive due to radiation and thus her shortness of breath with stairs. Compliant with lasix 40mg  daily. Also on potassium and spironolactone (no hyperkalemia). Also on metoprolol.   Osteoporosis- on boniva since 2015. Takes calcium and vit d. Check vit d   History of hodgkins disease in 1970s completed chemotherapy and doing well  Hypothyroidism- stable on levothyroxine 125 mcg. Update tsh  CKD III- update bmet   See me between now and your November visit with cardiology then we will return to yearly physicals after that unless you need [place] sooner. That way you can be seen every 6 months. Could consider doing annual wellness visits with our nurse in between these physician visits as well for another chance to check in.   Orders Placed This Encounter  Procedures  . CBC with Differential/Platelet  . Comprehensive metabolic panel    [place]    Order Specific Question:   Has the patient fasted?    Answer:   No  . Lipid panel    [place]    Order Specific Question:   Has the patient fasted?    Answer:   No  . TSH    [place]  . VITAMIN D 25 Hydroxy (Vit-D Deficiency, Fractures)    [place]    Meds ordered this encounter  Medications  . diphenhydramine-acetaminophen (TYLENOL PM) 25-500 MG TABS tablet    Sig: Take 1 tablet by mouth at bedtime as needed.  Marland Kitchen aspirin-acetaminophen-caffeine (EXCEDRIN MIGRAINE) 250-250-65 MG tablet    Sig: Take 2 tablets by  mouth every 6 (six) hours as needed for headache.  Return precautions advised.  Garret Reddish, MD

## 2016-06-26 NOTE — Progress Notes (Signed)
Pre visit review using our clinic review tool, if applicable. No additional management support is needed unless otherwise documented below in the visit note. 

## 2016-06-29 ENCOUNTER — Other Ambulatory Visit: Payer: Self-pay | Admitting: Family Medicine

## 2016-07-03 ENCOUNTER — Ambulatory Visit (INDEPENDENT_AMBULATORY_CARE_PROVIDER_SITE_OTHER): Payer: Medicare Other | Admitting: General Practice

## 2016-07-03 DIAGNOSIS — Z8679 Personal history of other diseases of the circulatory system: Secondary | ICD-10-CM

## 2016-07-03 DIAGNOSIS — Z952 Presence of prosthetic heart valve: Secondary | ICD-10-CM | POA: Diagnosis not present

## 2016-07-03 DIAGNOSIS — Z5181 Encounter for therapeutic drug level monitoring: Secondary | ICD-10-CM

## 2016-07-03 LAB — POCT INR: INR: 3.8

## 2016-07-03 NOTE — Progress Notes (Signed)
I have reviewed and agree with the plan. 

## 2016-07-03 NOTE — Patient Instructions (Signed)
Pre visit review using our clinic review tool, if applicable. No additional management support is needed unless otherwise documented below in the visit note. 

## 2016-07-31 ENCOUNTER — Ambulatory Visit (INDEPENDENT_AMBULATORY_CARE_PROVIDER_SITE_OTHER): Payer: Medicare Other | Admitting: General Practice

## 2016-07-31 DIAGNOSIS — Z5181 Encounter for therapeutic drug level monitoring: Secondary | ICD-10-CM | POA: Diagnosis not present

## 2016-07-31 DIAGNOSIS — Z952 Presence of prosthetic heart valve: Secondary | ICD-10-CM

## 2016-07-31 DIAGNOSIS — Z8679 Personal history of other diseases of the circulatory system: Secondary | ICD-10-CM

## 2016-07-31 LAB — POCT INR: INR: 2.8

## 2016-07-31 NOTE — Patient Instructions (Signed)
Pre visit review using our clinic review tool, if applicable. No additional management support is needed unless otherwise documented below in the visit note. 

## 2016-07-31 NOTE — Progress Notes (Signed)
I have reviewed and agree with the plan. 

## 2016-08-01 ENCOUNTER — Other Ambulatory Visit: Payer: Self-pay | Admitting: Family Medicine

## 2016-08-28 ENCOUNTER — Ambulatory Visit (INDEPENDENT_AMBULATORY_CARE_PROVIDER_SITE_OTHER): Payer: Medicare Other | Admitting: General Practice

## 2016-08-28 DIAGNOSIS — Z8679 Personal history of other diseases of the circulatory system: Secondary | ICD-10-CM

## 2016-08-28 DIAGNOSIS — Z952 Presence of prosthetic heart valve: Secondary | ICD-10-CM | POA: Diagnosis not present

## 2016-08-28 DIAGNOSIS — Z5181 Encounter for therapeutic drug level monitoring: Secondary | ICD-10-CM

## 2016-08-28 LAB — POCT INR: INR: 3.1

## 2016-08-28 NOTE — Patient Instructions (Signed)
Pre visit review using our clinic review tool, if applicable. No additional management support is needed unless otherwise documented below in the visit note. 

## 2016-09-25 ENCOUNTER — Ambulatory Visit (INDEPENDENT_AMBULATORY_CARE_PROVIDER_SITE_OTHER): Payer: Medicare Other | Admitting: General Practice

## 2016-09-25 DIAGNOSIS — Z952 Presence of prosthetic heart valve: Secondary | ICD-10-CM

## 2016-09-25 LAB — POCT INR: INR: 4.3

## 2016-09-25 NOTE — Patient Instructions (Signed)
Pre visit review using our clinic review tool, if applicable. No additional management support is needed unless otherwise documented below in the visit note. 

## 2016-09-25 NOTE — Progress Notes (Signed)
I have reviewed and agree with the plan. 

## 2016-10-16 ENCOUNTER — Ambulatory Visit (INDEPENDENT_AMBULATORY_CARE_PROVIDER_SITE_OTHER): Payer: Medicare Other | Admitting: General Practice

## 2016-10-16 DIAGNOSIS — Z952 Presence of prosthetic heart valve: Secondary | ICD-10-CM

## 2016-10-16 LAB — POCT INR: INR: 3.9

## 2016-10-16 NOTE — Patient Instructions (Signed)
Pre visit review using our clinic review tool, if applicable. No additional management support is needed unless otherwise documented below in the visit note. 

## 2016-10-16 NOTE — Progress Notes (Signed)
I have reviewed and agree with the plan. 

## 2016-11-13 ENCOUNTER — Ambulatory Visit (INDEPENDENT_AMBULATORY_CARE_PROVIDER_SITE_OTHER): Payer: Medicare Other | Admitting: General Practice

## 2016-11-13 DIAGNOSIS — Z952 Presence of prosthetic heart valve: Secondary | ICD-10-CM | POA: Diagnosis not present

## 2016-11-13 LAB — POCT INR: INR: 2.3

## 2016-11-13 NOTE — Patient Instructions (Signed)
Pre visit review using our clinic review tool, if applicable. No additional management support is needed unless otherwise documented below in the visit note. 

## 2016-11-13 NOTE — Progress Notes (Signed)
I have reviewed and agree with the plan. 

## 2016-11-20 ENCOUNTER — Telehealth: Payer: Self-pay | Admitting: *Deleted

## 2016-11-20 NOTE — Telephone Encounter (Signed)
PA with [facility] called to report patient being seen with new atrial flutter, TSH was checked results 0.25; PA requesting PCP approval to make adjustments to thyroid medication; PA plans to decrease  Synthroid from 145mcg to 132mcg; documentation and results will be available in Care Everywhere.   Discussed with Dr. [name] who approved for PA to make adjustments.

## 2016-12-03 ENCOUNTER — Encounter: Payer: Self-pay | Admitting: Family Medicine

## 2016-12-11 ENCOUNTER — Ambulatory Visit: Payer: Medicare Other

## 2016-12-13 ENCOUNTER — Ambulatory Visit (INDEPENDENT_AMBULATORY_CARE_PROVIDER_SITE_OTHER): Payer: Medicare Other | Admitting: General Practice

## 2016-12-13 DIAGNOSIS — Z952 Presence of prosthetic heart valve: Secondary | ICD-10-CM | POA: Diagnosis not present

## 2016-12-13 LAB — POCT INR: INR: 3.8

## 2016-12-13 NOTE — Patient Instructions (Signed)
Pre visit review using our clinic review tool, if applicable. No additional management support is needed unless otherwise documented below in the visit note. 

## 2016-12-13 NOTE — Progress Notes (Signed)
I have reviewed and agree with the plan. 

## 2016-12-20 ENCOUNTER — Ambulatory Visit (INDEPENDENT_AMBULATORY_CARE_PROVIDER_SITE_OTHER): Payer: Medicare Other | Admitting: Family Medicine

## 2016-12-20 ENCOUNTER — Encounter: Payer: Self-pay | Admitting: Family Medicine

## 2016-12-20 DIAGNOSIS — I10 Essential (primary) hypertension: Secondary | ICD-10-CM | POA: Insufficient documentation

## 2016-12-20 DIAGNOSIS — I4892 Unspecified atrial flutter: Secondary | ICD-10-CM | POA: Insufficient documentation

## 2016-12-20 DIAGNOSIS — I483 Typical atrial flutter: Secondary | ICD-10-CM

## 2016-12-20 DIAGNOSIS — E039 Hypothyroidism, unspecified: Secondary | ICD-10-CM | POA: Diagnosis not present

## 2016-12-20 LAB — BASIC METABOLIC PANEL
BUN: 28 mg/dL — ABNORMAL HIGH (ref 6–23)
CO2: 30 mEq/L (ref 19–32)
Calcium: 11.1 mg/dL — ABNORMAL HIGH (ref 8.4–10.5)
Chloride: 100 mEq/L (ref 96–112)
Creatinine, Ser: 1.37 mg/dL — ABNORMAL HIGH (ref 0.40–1.20)
GFR: 40.94 mL/min — ABNORMAL LOW (ref >60.00)
Glucose, Bld: 95 mg/dL (ref 70–99)
Potassium: 3.9 mEq/L (ref 3.5–5.1)
Sodium: 140 mEq/L (ref 135–145)

## 2016-12-20 LAB — TSH: TSH: 0.51 u[IU]/mL (ref 0.35–4.50)

## 2016-12-20 NOTE — Patient Instructions (Addendum)
We will see you 6 months from cardiology visit for physical as long as thyroid levels have normalized and stay that way  Please stop by lab before you go

## 2016-12-20 NOTE — Assessment & Plan Note (Signed)
S: controlled on lasix 40mg  daily, spironolactone 50mg , metoprolol daily though primarily for CHF and cardiac needs When dealing with a flutter issues apparently BP had gotten as high as 180 on evening but has not noted any #s this high BP Readings from Last 3 Encounters:  12/20/16 134/86  06/26/16 120/82  04/15/16 (!) 136/58  A/P: We discussed blood pressure goal of <140/90. Continue current meds

## 2016-12-20 NOTE — Assessment & Plan Note (Signed)
S: discovered at [facility]. Already on coumadin for mechanical valve. Metoprolol through cardiology. She was symptomatic at time with frequent palpitations found to have overtreated thyroid- symptoms have improved with lowering dose A/P: in a flutter today likely- continue current meds and update tsh. She is hoping to avoid cardioversion- was told didn't have to have unless symptomatic and symptoms minimal so likely can avoid

## 2016-12-20 NOTE — Assessment & Plan Note (Signed)
S: started in atrial flutter- noted at [facility]. TSh was suppressed and moved dose of levothryoxine to 112 mcg (on 11/22/16) from 125 mcg. She has noted improvement in palpitations with reduced dose.  ROS-No hair or nail changes. Hot intolerance is improving. No constipation or diarrhea. Has had some palpitations A/P: update tsh today- if slightly low TSH may give another 2 weeks- if far off consider further dose reduction

## 2016-12-20 NOTE — Progress Notes (Signed)
Subjective:  Grace [name] is a 66 y.o. year old very pleasant female patient who presents for/with See problem oriented charting ROS- palpitations improving, hot intolerance improving. No chest pain or shortness of breath.    Past Medical History-  Patient Active Problem List   Diagnosis Date Noted  . Atrial flutter ([place]) 12/20/2016    Priority: High  . Osteoporosis. check vitamin D yearly. 01/17/2014    Priority: High  . H/O aortic valve replacement and mitral valve replacement. 01/07/2013    Priority: High  . WOLFF (WOLFE)-PARKINSON-WHITE (WPW) SYNDROME 08/16/2009    Priority: High  . Congestive heart failure ([place]) 08/03/2009    Priority: High  . TRANSIENT ISCHEMIC ATTACKS, HX OF 04/27/2007    Priority: High  . CKD (chronic kidney disease), stage III 01/18/2014    Priority: Medium  . Iron deficiency anemia 08/16/2009    Priority: Medium  . Hyperlipidemia 08/29/2008    Priority: Medium  . Hypothyroidism 11/03/2006    Priority: Medium  . COMMON MIGRAINE 11/03/2006    Priority: Medium  . BREAST CANCER, HX OF 11/03/2006    Priority: Medium  . History of Hodgkin's disease 11/03/2006    Priority: Medium  . Long term (current) use of anticoagulants 12/23/2012    Priority: Low  . GASTROINTESTINAL HEMORRHAGE, HX OF 11/03/2006    Priority: Low  . Hypertension 12/20/2016    Medications- reviewed and updated Current Outpatient Prescriptions  Medication Sig Dispense Refill  . aspirin 81 MG tablet Take 81 mg by mouth daily.      Marland Kitchen aspirin-acetaminophen-caffeine (EXCEDRIN MIGRAINE) 250-250-65 MG tablet Take 2 tablets by mouth every 6 (six) hours as needed for headache.    . Calcium Carbonate-Vitamin D (CALTRATE 600+D) 600-400 MG-UNIT per tablet Take 2 tablets by mouth daily.     . diphenhydramine-acetaminophen (TYLENOL PM) 25-500 MG TABS tablet Take 1 tablet by mouth at bedtime as needed.    . docusate sodium (COLACE) 100 MG capsule Take 100 mg by mouth daily.     . furosemide  (LASIX) 40 MG tablet TAKE 1 TABLET BY MOUTH  DAILY 90 tablet 1  . ibandronate (BONIVA) 150 MG tablet TAKE 1 TABLET BY MOUTH  MONTHLY IN MORNING WITH  FULL GLASS WATER ON EMPTY  STOMACH. NOTHING ELSE BY  MOUTH/LIE DOWN 1 HOUR 3 tablet 3  . levothyroxine (SYNTHROID, LEVOTHROID) 112 MCG tablet Take 1 tablet by mouth daily.  11  . Magnesium 250 MG TABS Take 1 tablet by mouth daily.    . metoprolol (TOPROL-XL) 50 MG 24 hr tablet Take 50 mg by mouth 2 (two) times daily.     . Multiple Vitamin (MULTIVITAMIN) tablet Take 1 tablet by mouth daily.      . potassium chloride SA (K-DUR,KLOR-CON) 20 MEQ tablet Take 1 tablet by mouth  daily 90 tablet 3  . spironolactone (ALDACTONE) 25 MG tablet Take 50 mg by mouth daily.     Marland Kitchen warfarin (COUMADIN) 5 MG tablet TAKE AS DIRECTED CURRENTLY: 1 TABLET BY MOUTH DAILY  EXCEPT 6MG  ON TUESDAY AND  SATURDAY (Patient taking differently: TAKE AS DIRECTED CURRENTLY: 1 TABLET BY MOUTH DAILY  EXCEPT WEDNESDAY TAKE 7.5MG ) 105 tablet 1   No current facility-administered medications for this visit.     Objective: BP 134/86 (BP Location: Left Arm, Patient Position: Sitting, Cuff Size: Large)   Pulse 90   Temp 98 F (36.7 C) (Oral)   Ht 5\' 6"  (1.676 m)   Wt 133 lb 9.6 oz (60.6 kg)  SpO2 97%   BMI 21.56 kg/m  Gen: NAD, resting comfortably CV: irregularly irregular, mechanical murmur noted.  Lungs: CTAB no crackles, wheeze, rhonchi Abdomen: soft/nontender/nondistended/normal bowel sounds. No rebound or guarding.  Ext: no edema Skin: warm, dry  Assessment/Plan:  Summary from last visit " Followed by [facility] cardiology for [facility] parkinson white syndrome  History of aortic valve and mitral valve replacement mechanical- on coumadin. History of Gi hemorrhage in 990s but none recently. Had unknown cause of iron deficiency anemia- has had normal colonoscopies and GI workup. MV with iron. Cards also has her on aspirin   CHF- restrictive due to radiation and thus her  shortness of breath with stairs. Compliant with lasix 40mg  daily. Also on potassium and spironolactone (no hyperkalemia). Also on metoprolol.   Osteoporosis- on boniva since 2015. Takes calcium and vit d. Check vit d   History of hodgkins disease in 40s completed chemotherapy and doing well ... CKD III- update bmet "  Hypothyroidism S: started in atrial flutter- noted at [facility]. TSh was suppressed and moved dose of levothryoxine to 112 mcg (on 11/22/16) from 125 mcg. She has noted improvement in palpitations with reduced dose.  ROS-No hair or nail changes. Hot intolerance is improving. No constipation or diarrhea. Has had some palpitations A/P: update tsh today- if slightly low TSH may give another 2 weeks- if far off consider further dose reduction  Atrial flutter ([place]) S: discovered at [facility]. Already on coumadin for mechanical valve. Metoprolol through cardiology. She was symptomatic at time with frequent palpitations found to have overtreated thyroid- symptoms have improved with lowering dose A/P: in a flutter today likely- continue current meds and update tsh. She is hoping to avoid cardioversion- was told didn't have to have unless symptomatic and symptoms minimal so likely can avoid  Hypertension S: controlled on lasix 40mg  daily, spironolactone 50mg , metoprolol daily though primarily for CHF and cardiac needs When dealing with a flutter issues apparently BP had gotten as high as 180 on evening but has not noted any #s this high BP Readings from Last 3 Encounters:  12/20/16 134/86  06/26/16 120/82  04/15/16 (!) 136/58  A/P: We discussed blood pressure goal of <140/90. Continue current meds   6 months from cardiology visit for physical  Meds ordered this encounter  Medications  . levothyroxine (SYNTHROID, LEVOTHROID) 112 MCG tablet    Sig: Take 1 tablet by mouth daily.    Refill:  11   Return precautions advised.  Garret Reddish, MD

## 2016-12-23 ENCOUNTER — Encounter: Payer: Self-pay | Admitting: Family Medicine

## 2016-12-24 ENCOUNTER — Other Ambulatory Visit: Payer: Self-pay

## 2016-12-24 MED ORDER — LEVOTHYROXINE SODIUM 112 MCG PO TABS: 112.0000 ug | ORAL_TABLET | Freq: Every day | ORAL | 3 refills | Status: DC

## 2016-12-26 ENCOUNTER — Other Ambulatory Visit: Payer: Self-pay

## 2017-01-09 ENCOUNTER — Other Ambulatory Visit: Payer: Self-pay | Admitting: Family Medicine

## 2017-01-10 ENCOUNTER — Ambulatory Visit (INDEPENDENT_AMBULATORY_CARE_PROVIDER_SITE_OTHER): Payer: Medicare Other | Admitting: General Practice

## 2017-01-10 DIAGNOSIS — Z952 Presence of prosthetic heart valve: Secondary | ICD-10-CM | POA: Diagnosis not present

## 2017-01-10 LAB — POCT INR: INR: 5.3

## 2017-01-10 NOTE — Progress Notes (Signed)
I have reviewed and agree with the plan. 

## 2017-01-10 NOTE — Patient Instructions (Signed)
Pre visit review using our clinic review tool, if applicable. No additional management support is needed unless otherwise documented below in the visit note. 

## 2017-01-23 NOTE — Patient Instructions (Signed)
Pre visit review using our clinic review tool, if applicable. No additional management support is needed unless otherwise documented below in the visit note. 

## 2017-01-24 ENCOUNTER — Other Ambulatory Visit (INDEPENDENT_AMBULATORY_CARE_PROVIDER_SITE_OTHER): Payer: Medicare Other

## 2017-01-24 ENCOUNTER — Ambulatory Visit (INDEPENDENT_AMBULATORY_CARE_PROVIDER_SITE_OTHER): Payer: Medicare Other | Admitting: General Practice

## 2017-01-24 DIAGNOSIS — Z952 Presence of prosthetic heart valve: Secondary | ICD-10-CM | POA: Diagnosis not present

## 2017-01-24 LAB — CALCIUM: Calcium: 9.9 mg/dL (ref 8.4–10.5)

## 2017-01-24 LAB — POCT INR: INR: 2.3

## 2017-01-30 ENCOUNTER — Other Ambulatory Visit: Payer: Self-pay | Admitting: Family Medicine

## 2017-02-14 ENCOUNTER — Ambulatory Visit (INDEPENDENT_AMBULATORY_CARE_PROVIDER_SITE_OTHER): Payer: Medicare Other | Admitting: General Practice

## 2017-02-14 DIAGNOSIS — Z952 Presence of prosthetic heart valve: Secondary | ICD-10-CM

## 2017-02-14 DIAGNOSIS — Z7901 Long term (current) use of anticoagulants: Secondary | ICD-10-CM

## 2017-02-14 LAB — POCT INR: INR: 2.5

## 2017-02-14 NOTE — Patient Instructions (Signed)
Pre visit review using our clinic review tool, if applicable. No additional management support is needed unless otherwise documented below in the visit note. 

## 2017-02-25 ENCOUNTER — Other Ambulatory Visit: Payer: Self-pay | Admitting: Family Medicine

## 2017-02-28 ENCOUNTER — Other Ambulatory Visit: Payer: Self-pay | Admitting: *Deleted

## 2017-02-28 MED ORDER — WARFARIN SODIUM 5 MG PO TABS: 5.0000 mg | ORAL_TABLET | Freq: Every day | ORAL | 1 refills | Status: DC

## 2017-03-13 ENCOUNTER — Other Ambulatory Visit: Payer: Self-pay

## 2017-03-13 ENCOUNTER — Telehealth: Payer: Self-pay | Admitting: Family Medicine

## 2017-03-13 MED ORDER — LEVOTHYROXINE SODIUM 112 MCG PO TABS: 112.0000 ug | ORAL_TABLET | Freq: Every day | ORAL | 3 refills | Status: DC

## 2017-03-13 NOTE — Telephone Encounter (Signed)
Patient called in reference to pharmacy needing new Rx for levothyroxine (SYNTHROID, LEVOTHROID) 112 MCG tablet. [facility] stated they are unable to get this Rx. Please call patient and advise when filled. OK to leave message.

## 2017-03-13 NOTE — Telephone Encounter (Signed)
Called patient and left a voicemail message asking for a return phone call. Need clarification

## 2017-03-13 NOTE — Telephone Encounter (Signed)
Reordered medication whole on the phone with patient. She wanted her prescription sent to [facility]

## 2017-03-14 ENCOUNTER — Ambulatory Visit (INDEPENDENT_AMBULATORY_CARE_PROVIDER_SITE_OTHER): Payer: Medicare Other | Admitting: General Practice

## 2017-03-14 ENCOUNTER — Telehealth: Payer: Self-pay | Admitting: Family Medicine

## 2017-03-14 DIAGNOSIS — Z7901 Long term (current) use of anticoagulants: Secondary | ICD-10-CM | POA: Diagnosis not present

## 2017-03-14 DIAGNOSIS — Z23 Encounter for immunization: Secondary | ICD-10-CM

## 2017-03-14 DIAGNOSIS — Z952 Presence of prosthetic heart valve: Secondary | ICD-10-CM

## 2017-03-14 LAB — POCT INR: INR: 2.5

## 2017-03-14 LAB — HM MAMMOGRAPHY

## 2017-03-14 LAB — HM DEXA SCAN

## 2017-03-14 NOTE — Patient Instructions (Signed)
Pre visit review using our clinic review tool, if applicable. No additional management support is needed unless otherwise documented below in the visit note. 

## 2017-03-14 NOTE — Telephone Encounter (Signed)
Pharmacy called in reference to Rx for levothyroxine (SYNTHROID, LEVOTHROID) 112 MCG tablet. Pharmacy stated this is currently out of stock. Please call pharmacy and advise at [phone].

## 2017-03-17 ENCOUNTER — Encounter: Payer: Self-pay | Admitting: Family Medicine

## 2017-03-17 NOTE — Telephone Encounter (Signed)
Called and spoke to pharmacy who states they actually do have the brand that the patient is on currently taking. They are sending it out

## 2017-03-17 NOTE — Telephone Encounter (Signed)
Patient called to advise that [facility] has adjusted the medication and stated it is processing. Patient would like to place a hold [facility] for [facility] few days and she may call back to ask for the rx to be sent.  No further action required at this time per the patient.

## 2017-03-17 NOTE — Telephone Encounter (Signed)
Patient calling back to inform that she still needs a Rx sent to  [id number] [place], [place]. [phone] (Phone) [phone] (Fax)   For levothyroxine (SYNTHROID, LEVOTHROID) 112 MCG tablet. Patient stated [facility] does not have the levothyroxine. Please call patient with any questions.

## 2017-03-17 NOTE — Telephone Encounter (Signed)
Can you call and see what they have available? They may have other sizes

## 2017-03-17 NOTE — Telephone Encounter (Signed)
Called and spoke to patient and informed her that they are sending out her medication. She verbalized understanding

## 2017-03-17 NOTE — Telephone Encounter (Signed)
MEDICATION: levothyroxine (SYNTHROID, LEVOTHROID) 112 MCG tablet  PHARMACY:  [id number] - [place], [place] - [address]  IS THIS A 90 DAY SUPPLY : yes  IS PATIENT OUT OF MEDICATION: yes for 1 month  IF NOT; HOW MUCH IS LEFT: n/a  LAST APPOINTMENT DATE: @10 /18/2018  NEXT APPOINTMENT DATE:@Visit  date not found  OTHER COMMENTS: Call patient with any questions. She stated that there was some confusion with [facility]. Send to the pharmacy above, call patient to clarify if needed.    **Let patient know to contact pharmacy at the end of the day to make sure medication is ready. **  ** Please notify patient to allow 48-72 hours to process**  **Encourage patient to contact the pharmacy for refills or they can request refills through [facility]**

## 2017-03-17 NOTE — Telephone Encounter (Signed)
Noted  

## 2017-03-25 ENCOUNTER — Encounter: Payer: Self-pay | Admitting: Family Medicine

## 2017-04-01 ENCOUNTER — Other Ambulatory Visit: Payer: Self-pay

## 2017-04-01 MED ORDER — POTASSIUM CHLORIDE CRYS ER 20 MEQ PO TBCR: 20.0000 meq | EXTENDED_RELEASE_TABLET | Freq: Every day | ORAL | 1 refills | Status: DC

## 2017-04-09 ENCOUNTER — Ambulatory Visit: Payer: Medicare Other | Admitting: General Practice

## 2017-04-09 DIAGNOSIS — Z7901 Long term (current) use of anticoagulants: Secondary | ICD-10-CM | POA: Diagnosis not present

## 2017-04-09 DIAGNOSIS — Z952 Presence of prosthetic heart valve: Secondary | ICD-10-CM

## 2017-04-09 LAB — POCT INR: INR: 2.5

## 2017-04-09 NOTE — Patient Instructions (Addendum)
Pre visit review using our clinic review tool, if applicable. No additional management support is needed unless otherwise documented below in the visit note.  Continue 1 tablet daily . Recheck in 4 weeks.  

## 2017-05-07 ENCOUNTER — Ambulatory Visit: Payer: Self-pay | Admitting: Family Medicine

## 2017-05-07 ENCOUNTER — Ambulatory Visit: Payer: Medicare Other

## 2017-05-07 DIAGNOSIS — Z7901 Long term (current) use of anticoagulants: Secondary | ICD-10-CM

## 2017-05-07 DIAGNOSIS — Z952 Presence of prosthetic heart valve: Secondary | ICD-10-CM

## 2017-05-07 LAB — POCT INR: INR: 2.8

## 2017-05-07 NOTE — Patient Instructions (Signed)
Please continue to take 5 mg daily and re-check in 4 weeks.

## 2017-05-07 NOTE — Progress Notes (Signed)
I have reviewed and agree with note, evaluation, plan.   [name], MD  

## 2017-05-09 ENCOUNTER — Ambulatory Visit: Payer: Medicare Other

## 2017-06-03 ENCOUNTER — Ambulatory Visit: Payer: Medicare Other | Admitting: General Practice

## 2017-06-03 DIAGNOSIS — Z7901 Long term (current) use of anticoagulants: Secondary | ICD-10-CM

## 2017-06-03 LAB — POCT INR: INR: 2.6

## 2017-06-03 NOTE — Patient Instructions (Addendum)
Pre visit review using our clinic review tool, if applicable. No additional management support is needed unless otherwise documented below in the visit note.  Continue 1 tablet daily . Recheck in 4 weeks.  

## 2017-06-17 ENCOUNTER — Other Ambulatory Visit: Payer: Self-pay | Admitting: Family Medicine

## 2017-06-23 ENCOUNTER — Other Ambulatory Visit: Payer: Self-pay | Admitting: Family Medicine

## 2017-07-01 ENCOUNTER — Ambulatory Visit: Payer: Medicare Other

## 2017-07-01 LAB — POCT INR: INR: 3.5

## 2017-07-04 ENCOUNTER — Ambulatory Visit (INDEPENDENT_AMBULATORY_CARE_PROVIDER_SITE_OTHER): Payer: Medicare Other | Admitting: General Practice

## 2017-07-04 DIAGNOSIS — Z7901 Long term (current) use of anticoagulants: Secondary | ICD-10-CM

## 2017-07-04 NOTE — Patient Instructions (Signed)
Pre visit review using our clinic review tool, if applicable. No additional management support is needed unless otherwise documented below in the visit note.  Continue 1 tablet daily . Recheck in 4 weeks.  

## 2017-07-10 ENCOUNTER — Encounter: Payer: Self-pay | Admitting: Family Medicine

## 2017-07-10 ENCOUNTER — Ambulatory Visit: Payer: Medicare Other | Admitting: Family Medicine

## 2017-07-10 VITALS — BP 128/74 | HR 76 | Temp 97.7°F | Ht 66.0 in | Wt 132.8 lb

## 2017-07-10 DIAGNOSIS — R059 Cough, unspecified: Secondary | ICD-10-CM

## 2017-07-10 DIAGNOSIS — R05 Cough: Secondary | ICD-10-CM

## 2017-07-10 MED ORDER — DOXYCYCLINE HYCLATE 100 MG PO TABS: 100.0000 mg | ORAL_TABLET | Freq: Two times a day (BID) | ORAL | 0 refills | Status: DC

## 2017-07-10 MED ORDER — BENZONATATE 200 MG PO CAPS: 200.0000 mg | ORAL_CAPSULE | Freq: Two times a day (BID) | ORAL | 0 refills | Status: DC | PRN

## 2017-07-10 NOTE — Progress Notes (Signed)
    Subjective:  Grace [name] is a 67 y.o. female who presents today for same-day appointment with a chief complaint of cough.   HPI:  Cough, Acute Issue Started 9 days ago. Worsened over that time. Associated with some wheezing and sputum production No fevers or chills. No rhinorrhea. Tried OTC cough medications which are not helping. Husband has had similar symptoms that have since resolved.  No obvious precipitating events.  No other obvious alleviating or aggravating factors.  ROS: Per HPI  PMH: She reports that  has never smoked. she has never used smokeless tobacco. She reports that she does not drink alcohol or use drugs.  Objective:  Physical Exam: BP 128/74 (BP Location: Left Arm, Patient Position: Sitting, Cuff Size: Normal)   Pulse 76   Temp 97.7 F (36.5 C) (Oral)   Ht 5\' 6"  (1.676 m)   Wt 132 lb 12.8 oz (60.2 kg)   SpO2 97%   BMI 21.43 kg/m   Gen: NAD, resting comfortably HEENT: TMs with clear effusion bilaterally.  His mucosa erythematous with thick, white nasal discharge.  Oropharynx slightly erythematous without exudate.  No lymphadenopathy. CV: RRR with no murmurs appreciated Pulm: NWOB, CTAB with no crackles, wheezes, or rhonchi  Assessment/Plan:  Cough We will start doxycycline given that symptoms have been persistent for nearly 10 days.  Will also start Tessalon for her cough.  Discussed supportive measures including good oral hydration and use of Tylenol as needed for low-grade fever and [name].  Discussed reasons to return to care.  Follow up as needed.  Grace [name]. Grace Pain, MD 07/10/2017 1:37 PM

## 2017-07-10 NOTE — Patient Instructions (Addendum)
Start the doxycycline  Start tessalon for your cough.  Please stay well hydrated.  You can take tylenol as needed for low grade fever and pain.  Please let me know if your symptoms worsen or fail to improve.  Take care, Dr Jerline Pain

## 2017-07-29 ENCOUNTER — Ambulatory Visit: Payer: Medicare Other | Admitting: General Practice

## 2017-07-29 DIAGNOSIS — Z7901 Long term (current) use of anticoagulants: Secondary | ICD-10-CM | POA: Diagnosis not present

## 2017-07-29 DIAGNOSIS — Z952 Presence of prosthetic heart valve: Secondary | ICD-10-CM | POA: Diagnosis not present

## 2017-07-29 LAB — POCT INR: INR: 2.7

## 2017-07-29 NOTE — Patient Instructions (Addendum)
Pre visit review using our clinic review tool, if applicable. No additional management support is needed unless otherwise documented below in the visit note.  Continue 1 tablet daily.  Re-check in 5 weeks.

## 2017-09-02 ENCOUNTER — Ambulatory Visit: Payer: Medicare Other | Admitting: General Practice

## 2017-09-02 DIAGNOSIS — Z7901 Long term (current) use of anticoagulants: Secondary | ICD-10-CM | POA: Diagnosis not present

## 2017-09-02 DIAGNOSIS — Z952 Presence of prosthetic heart valve: Secondary | ICD-10-CM | POA: Diagnosis not present

## 2017-09-02 LAB — POCT INR: INR: 2.2

## 2017-09-02 NOTE — Patient Instructions (Addendum)
Pre visit review using our clinic review tool, if applicable. No additional management support is needed unless otherwise documented below in the visit note.  Take extra 1/2 tablet today and then continue 1 tablet daily.  Re-check in 4 weeks.

## 2017-09-26 ENCOUNTER — Other Ambulatory Visit: Payer: Self-pay | Admitting: Family Medicine

## 2017-09-30 ENCOUNTER — Ambulatory Visit: Payer: Medicare Other | Admitting: General Practice

## 2017-09-30 DIAGNOSIS — Z952 Presence of prosthetic heart valve: Secondary | ICD-10-CM | POA: Diagnosis not present

## 2017-09-30 DIAGNOSIS — Z7901 Long term (current) use of anticoagulants: Secondary | ICD-10-CM

## 2017-09-30 LAB — POCT INR: INR: 3.4

## 2017-09-30 NOTE — Patient Instructions (Signed)
Pre visit review using our clinic review tool, if applicable. No additional management support is needed unless otherwise documented below in the visit note.  Continue 1 tablet daily . Recheck in 4 weeks.  

## 2017-10-21 ENCOUNTER — Ambulatory Visit (INDEPENDENT_AMBULATORY_CARE_PROVIDER_SITE_OTHER): Payer: Medicare Other | Admitting: Family Medicine

## 2017-10-21 ENCOUNTER — Encounter: Payer: Self-pay | Admitting: Family Medicine

## 2017-10-21 VITALS — BP 118/76 | HR 78 | Temp 98.1°F | Ht 66.0 in | Wt 135.8 lb

## 2017-10-21 DIAGNOSIS — M810 Age-related osteoporosis without current pathological fracture: Secondary | ICD-10-CM | POA: Diagnosis not present

## 2017-10-21 DIAGNOSIS — N183 Chronic kidney disease, stage 3 unspecified: Secondary | ICD-10-CM

## 2017-10-21 DIAGNOSIS — Z0000 Encounter for general adult medical examination without abnormal findings: Secondary | ICD-10-CM | POA: Diagnosis not present

## 2017-10-21 DIAGNOSIS — I10 Essential (primary) hypertension: Secondary | ICD-10-CM | POA: Diagnosis not present

## 2017-10-21 DIAGNOSIS — E039 Hypothyroidism, unspecified: Secondary | ICD-10-CM

## 2017-10-21 DIAGNOSIS — E785 Hyperlipidemia, unspecified: Secondary | ICD-10-CM

## 2017-10-21 MED ORDER — IBANDRONATE SODIUM 150 MG PO TABS: 150.0000 mg | ORAL_TABLET | ORAL | 3 refills | Status: DC

## 2017-10-21 NOTE — Assessment & Plan Note (Signed)
S: poorly controlled on no statin. Statins have caused muscle aches and zetia too expensive. Flu like sypmtoms on statins Lab Results  Component Value Date   CHOL 210 (H) 06/26/2016   HDL 64.20 06/26/2016   LDLCALC 125 (H) 06/26/2016   LDLDIRECT 101.0 03/16/2015   TRIG 103.0 06/26/2016   CHOLHDL 3 06/26/2016   A/P: we discuseed getting labs next week when she does coumadin check. We did toss out idea of Atorvastatin 10mg  trial once weekly- perhaps would have lower side effect profile

## 2017-10-21 NOTE — Assessment & Plan Note (Signed)
Her bone density was stable to improved in 2018. Started boniva 2015. We agreed to recheck next year and if still stable stop boniva and repeat in 2 more years to guide our decision making

## 2017-10-21 NOTE — Patient Instructions (Addendum)
Please stop by lab before you go  No changes today  Lets check in every 6 months or so.   Double check with desk but I think it has been a year so you are also eligible for... I would also like for you to sign up for an annual wellness visit with one of our nurses, Cassie or Manuela Schwartz, who both specialize in the annual wellness visit. This is a free benefit under medicare that may help [place] find additional ways to help you. Some highlights are reviewing medications, lifestyle, and doing a dementia screen.

## 2017-10-21 NOTE — Progress Notes (Signed)
Phone: [phone]  Subjective:  Patient presents today for their annual physical. Chief complaint-noted.   See problem oriented charting- ROS- full  review of systems was completed and negative except for: shortness of breath stable at baseline, swelling in legs worse in summers but no reent weight gain, palpitations  The following were reviewed and entered/updated in epic: Past Medical History:  Diagnosis Date  . Anemia   . Aortic insufficiency   . AORTIC VALVE REPLACEMENT, HX OF 04/27/2007   Qualifier: Diagnosis of  By: Leanne Chang MD, Bruce    . Cancer (HCC)    breast  . CHF (congestive heart failure) ([place])   . Congestive heart failure ([place]) 08/03/2009   [facility] Dr. Paul Half. Last echo 2010 with EF 30%. Restrictive due to radiation. AVR MVR. 02/2014 visit planned.    . GI bleed   . Hodgkin's disease   . Hyperlipidemia   . Hypothyroidism   . Migraine   . MITRAL VALVE REPLACEMENT, HX OF 04/27/2007   Qualifier: Diagnosis of  By: Leanne Chang MD, Bruce    . WPW (Wolff-Parkinson-White syndrome)    Patient Active Problem List   Diagnosis Date Noted  . Atrial flutter ([place]) 12/20/2016    Priority: High  . Osteoporosis. check vitamin D yearly. 01/17/2014    Priority: High  . H/O aortic valve replacement and mitral valve replacement. 01/07/2013    Priority: High  . WOLFF (WOLFE)-PARKINSON-WHITE (WPW) SYNDROME 08/16/2009    Priority: High  . Congestive heart failure ([place]) 08/03/2009    Priority: High  . TRANSIENT ISCHEMIC ATTACKS, HX OF 04/27/2007    Priority: High  . Hypertension 12/20/2016    Priority: Medium  . CKD (chronic kidney disease), stage III ([place]) 01/18/2014    Priority: Medium  . Iron deficiency anemia 08/16/2009    Priority: Medium  . Hyperlipidemia 08/29/2008    Priority: Medium  . Hypothyroidism 11/03/2006    Priority: Medium  . COMMON MIGRAINE 11/03/2006    Priority: Medium  . BREAST CANCER, HX OF 11/03/2006    Priority: Medium  . History of Hodgkin's disease  11/03/2006    Priority: Medium  . Long term (current) use of anticoagulants 12/23/2012    Priority: Low  . GASTROINTESTINAL HEMORRHAGE, HX OF 11/03/2006    Priority: Low   Past Surgical History:  Procedure Laterality Date  . AORTIC VALVE SURGERY    . bilateral breast implants    . ENDOMETRIAL ABLATION    . MASTECTOMY    . MITRAL VALVE REPLACEMENT    . pericardial stripping  9/08  . SPLENECTOMY    . THORACOTOMY    . TONSILLECTOMY    . valvulopathy  06/13/06    Family History  Problem Relation Age of Onset  . Heart disease Mother   . Cancer Mother        breast  . Heart disease Maternal Aunt   . Cancer Maternal Aunt        uterine  . Heart disease Maternal Grandmother   . Diabetes Maternal Grandmother     Medications- reviewed and updated Current Outpatient Medications  Medication Sig Dispense Refill  . Calcium Carbonate-Vitamin D 600-400 MG-UNIT tablet Take 1 tablet by mouth daily.    Marland Kitchen aspirin 81 MG tablet Take 81 mg by mouth daily.      Marland Kitchen aspirin-acetaminophen-caffeine (EXCEDRIN MIGRAINE) 250-250-65 MG tablet Take 2 tablets by mouth every 6 (six) hours as needed for headache.    . diphenhydramine-acetaminophen (TYLENOL PM) 25-500 MG TABS tablet Take 1 tablet  by mouth at bedtime as needed.    . docusate sodium (COLACE) 100 MG capsule Take 100 mg by mouth daily.     . furosemide (LASIX) 40 MG tablet TAKE 1 TABLET BY MOUTH  DAILY 90 tablet 1  . ibandronate (BONIVA) 150 MG tablet Take 1 tablet (150 mg total) by mouth every 30 (thirty) days. In AM w. full glass of h20 and do not take anything else by mouth for 30 mins 3 tablet 3  . levothyroxine (SYNTHROID, LEVOTHROID) 112 MCG tablet Take 1 tablet (112 mcg total) by mouth daily. 90 tablet 3  . Magnesium 250 MG TABS Take 1 tablet by mouth daily.    . metoprolol (TOPROL-XL) 50 MG 24 hr tablet Take 50 mg by mouth 2 (two) times daily.     . potassium chloride SA (K-DUR,KLOR-CON) 20 MEQ tablet TAKE 1 TABLET BY MOUTH  DAILY 90  tablet 1  . spironolactone (ALDACTONE) 25 MG tablet Take 50 mg by mouth daily.     Marland Kitchen warfarin (COUMADIN) 5 MG tablet Take 1 tablet (5 mg total) by mouth daily. 90 tablet 1   No current facility-administered medications for this visit.     Allergies-reviewed and updated Allergies  Allergen Reactions  . Statins     myalgia    Social History   Social History Narrative   Lived in [place] for 18 years. Moved here for the weather.    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      Husband retired January 2015. Married 1991 (2nd marriage). No kids.       Hobbies: walks 2 miles per day, read, work outside          Objective: BP 118/76 (BP Location: Left Arm, Patient Position: Sitting, Cuff Size: Normal)   Pulse 78   Temp 98.1 F (36.7 C) (Oral)   Ht 5\' 6"  (1.676 m)   Wt 135 lb 12.8 oz (61.6 kg)   SpO2 96%   BMI 21.92 kg/m  Gen: NAD, resting comfortably HEENT: Mucous membranes are moist. Oropharynx normal Neck: no obvious thyromegaly or lymphadenopathy CV: RRR no murmurs rubs or gallops Lungs: CTAB no crackles, wheeze, rhonchi Abdomen: soft/nontender/nondistended/normal bowel sounds. No rebound or guarding.  Ext: no edema but spider and varicose veins noted Skin: warm, dry Neuro: grossly normal, moves all extremities, PERRLA  Assessment/Plan:  67 y.o. female presenting for annual physical.  Health Maintenance counseling: 1. Anticipatory guidance: Patient counseled regarding regular dental exams -q6 months, eye exams - yearly, wearing seatbelts.  2. Risk factor reduction:  Advised patient of need for regular exercise and diet rich and fruits and vegetables to reduce risk of heart attack and stroke. Exercise- not walking due to heat but doing some outdoor work- encouraged indoor exercise- has a treadmill. Diet-reasonably weight.  Wt Readings from Last 3 Encounters:  10/21/17 135 lb 12.8 oz (61.6 kg)  07/10/17 132  lb 12.8 oz (60.2 kg)  12/20/16 133 lb 9.6 oz (60.6 kg)  3. Immunizations/screenings/ancillary studies- Discussed shingrix at pharmacy  Immunization History  Administered Date(s) Administered  . Influenza Split 03/21/2011, 03/24/2012  . Influenza Whole 03/16/2008, 04/27/2009, 03/01/2010  . Influenza, High Dose Seasonal PF 02/28/2016, 03/14/2017  . Influenza,inj,Quad PF,6+ Mos 03/12/2013, 03/15/2014, 03/14/2015  . Influenza-Unspecified 02/24/2014, 03/14/2015  . Pneumococcal Conjugate-13 03/26/2013  . Pneumococcal Polysaccharide-23 02/25/2003, 10/19/2015  . Td 06/24/2002, 03/26/2013  . Tdap 03/26/2013  . Zoster 04/24/2011  4. Cervical cancer screening- last  pap nov 2017 and was told normal. Passed age based screening requirements 5. Breast cancer screening/history of breast cancer with bilateral mastectomy back to 19991-  breast exam with GYN and mammogram -October 2018 6. Colon cancer screening - 2014 with 10 year repeat 7. Skin cancer screening- Seeing [facility] dermatology. Has had basal cell removed in last year. advised regular sunscreen use. Denies worrisome, changing, or new skin lesions.  8. Birth control/STD check- ablation/monogomous 9. Osteoporosis follow up- see problem oriented charting. Also check vitamin D to make sure  adequate  Status of chronic or acute concerns   History of hodgkins disease in 1970s- completed chemotherapy  CV issues [facility] 1. History wolff parkinson white syndrome 2. History of aortic and mitral valve replacement mechanical- on long term coumadin.  3. Went into atrial flutter- Placed on digoxin by [facility]- improved significantly in 3 years but started getting lightheaded- but heart rate got down into 40s and they stopped the medicine as she was having some presyncopal episodes. Now feeling some palpitations again particularly with laying down. She is already on coumadin. She states they may consider cardioversion- she has close follow up  in august and may consider.  4. Restrictive CHF- shortness of breath with stairs. Takes lasix 40mg  daily, spironolactone  25mg . Appears to have stable volume status and fortunately no hyperkalemia. Even though she is on potassium and spironolactone  Hypothyroidism- update tsh. On levothyroxine 125 mcg--> 112 mcg  CKD III- update bmet to trend. She should avoid nsaids.   Osteoporosis. check vitamin D yearly. Her bone density was stable to improved in 2018. Started boniva 2015. We agreed to recheck next year and if still stable stop boniva and repeat in 2 more years to guide our decision making  Hyperlipidemia S: poorly controlled on no statin. Statins have caused muscle aches and zetia too expensive. Flu like sypmtoms on statins Lab Results  Component Value Date   CHOL 210 (H) 06/26/2016   HDL 64.20 06/26/2016   LDLCALC 125 (H) 06/26/2016   LDLDIRECT 101.0 03/16/2015   TRIG 103.0 06/26/2016   CHOLHDL 3 06/26/2016   A/P: we discuseed getting labs next week when she does coumadin check. We did toss out idea of Atorvastatin 10mg  trial once weekly- perhaps would have lower side effect profile   Future Appointments  Date Time Provider [place]  10/28/2017  8:00 AM [facility]  04/28/2018  8:30 AM Marin Olp, MD [facility]   Return in about 6 months (around 04/23/2018) for follow up- or sooner if needed.  Lab/Order associations: Age-related osteoporosis without current pathological fracture - Plan: VITAMIN D 25 Hydroxy (Vit-D Deficiency, Fractures)  Essential hypertension - Plan: CBC, Comprehensive metabolic panel, Lipid panel  Hypothyroidism, unspecified type - Plan: TSH  Meds ordered this encounter  Medications  . ibandronate (BONIVA) 150 MG tablet    Sig: Take 1 tablet (150 mg total) by mouth every 30 (thirty) days. In AM w. full glass of h20 and do not take anything else by mouth for 30 mins    Dispense:  3 tablet    Refill:  3     Return precautions advised.  Garret Reddish, MD

## 2017-10-27 ENCOUNTER — Encounter: Payer: Self-pay | Admitting: Family Medicine

## 2017-10-27 ENCOUNTER — Ambulatory Visit: Payer: Medicare Other | Admitting: Family Medicine

## 2017-10-27 ENCOUNTER — Ambulatory Visit: Payer: Self-pay | Admitting: *Deleted

## 2017-10-27 VITALS — BP 112/68 | HR 77 | Temp 98.1°F | Ht 66.0 in | Wt 134.8 lb

## 2017-10-27 DIAGNOSIS — K1379 Other lesions of oral mucosa: Secondary | ICD-10-CM | POA: Diagnosis not present

## 2017-10-27 DIAGNOSIS — Z7901 Long term (current) use of anticoagulants: Secondary | ICD-10-CM | POA: Diagnosis not present

## 2017-10-27 LAB — POCT INR: INR: 3.1 — AB (ref 2.0–3.0)

## 2017-10-27 NOTE — Telephone Encounter (Signed)
Noted  

## 2017-10-27 NOTE — Progress Notes (Signed)
Noted! Thank you

## 2017-10-27 NOTE — Telephone Encounter (Signed)
Cleda Clarks approved the 11:30 appointment scheduled for today with Dr. [name].

## 2017-10-27 NOTE — Progress Notes (Signed)
Subjective:  Grace [name] is a 67 y.o. year old very pleasant female patient who presents for/with See problem oriented charting ROS-has had issues with bleeding gums.  No blood in stool or melena.  No chest pain or shortness of breath or abnormal fatigue  Past Medical History-  Patient Active Problem List   Diagnosis Date Noted  . Atrial flutter ([place]) 12/20/2016    Priority: High  . Osteoporosis. check vitamin D yearly. 01/17/2014    Priority: High  . H/O aortic valve replacement and mitral valve replacement. 01/07/2013    Priority: High  . WOLFF (WOLFE)-PARKINSON-WHITE (WPW) SYNDROME 08/16/2009    Priority: High  . Congestive heart failure ([place]) 08/03/2009    Priority: High  . TRANSIENT ISCHEMIC ATTACKS, HX OF 04/27/2007    Priority: High  . Hypertension 12/20/2016    Priority: Medium  . CKD (chronic kidney disease), stage III ([place]) 01/18/2014    Priority: Medium  . Iron deficiency anemia 08/16/2009    Priority: Medium  . Hyperlipidemia 08/29/2008    Priority: Medium  . Hypothyroidism 11/03/2006    Priority: Medium  . COMMON MIGRAINE 11/03/2006    Priority: Medium  . BREAST CANCER, HX OF 11/03/2006    Priority: Medium  . History of Hodgkin's disease 11/03/2006    Priority: Medium  . Long term (current) use of anticoagulants 12/23/2012    Priority: Low  . GASTROINTESTINAL HEMORRHAGE, HX OF 11/03/2006    Priority: Low    Medications- reviewed and updated Current Outpatient Medications  Medication Sig Dispense Refill  . Multiple Vitamin (MULTI-VITAMIN DAILY) TABS Take by mouth daily.    Marland Kitchen aspirin 81 MG tablet Take 81 mg by mouth daily.      Marland Kitchen aspirin-acetaminophen-caffeine (EXCEDRIN MIGRAINE) 250-250-65 MG tablet Take 2 tablets by mouth every 6 (six) hours as needed for headache.    . Calcium Carbonate-Vitamin D 600-400 MG-UNIT tablet Take 1 tablet by mouth daily.    . diphenhydramine-acetaminophen (TYLENOL PM) 25-500 MG TABS tablet Take 1 tablet by mouth at bedtime  as needed.    . docusate sodium (COLACE) 100 MG capsule Take 100 mg by mouth daily.     . furosemide (LASIX) 40 MG tablet TAKE 1 TABLET BY MOUTH  DAILY 90 tablet 1  . ibandronate (BONIVA) 150 MG tablet Take 1 tablet (150 mg total) by mouth every 30 (thirty) days. In AM w. full glass of h20 and do not take anything else by mouth for 30 mins 3 tablet 3  . levothyroxine (SYNTHROID, LEVOTHROID) 112 MCG tablet Take 1 tablet (112 mcg total) by mouth daily. 90 tablet 3  . Magnesium 250 MG TABS Take 1 tablet by mouth daily.    . metoprolol (TOPROL-XL) 50 MG 24 hr tablet Take 50 mg by mouth 2 (two) times daily.     . penicillin v potassium (VEETID) 500 MG tablet Take 500 mg by mouth 4 (four) times daily.  0  . potassium chloride SA (K-DUR,KLOR-CON) 20 MEQ tablet TAKE 1 TABLET BY MOUTH  DAILY 90 tablet 1  . spironolactone (ALDACTONE) 25 MG tablet Take 50 mg by mouth daily.     Marland Kitchen warfarin (COUMADIN) 5 MG tablet Take 1 tablet (5 mg total) by mouth daily. 90 tablet 1   No current facility-administered medications for this visit.     Objective: BP 112/68 (BP Location: Left Arm, Patient Position: Sitting, Cuff Size: Normal)   Pulse 77   Temp 98.1 F (36.7 C) (Oral)   Ht 5\' 6"  (  1.676 m)   Wt 134 lb 12.8 oz (61.1 kg)   SpO2 96%   BMI 21.76 kg/m  Gen: NAD, resting comfortably Back of gumline- area of dried blood noted CV: RRR harsh mechanical valve sound Lungs: CTAB no crackles, wheeze, rhonchi Abdomen: soft/nontender Ext: no edema Skin: warm, dry  Results for orders placed or performed in visit on 10/27/17 (from the past 24 hour(s))  POCT INR     Status: Abnormal   Collection Time: 10/27/17 11:50 AM  Result Value Ref Range   INR 3.1 (A) 2.0 - 3.0   Assessment/Plan:  Bleeding from mouth  Long term (current) use of anticoagulants - Plan: POCT INR S: Patient has been experiencing bleeding gums after dental procedure on Thursday.  Bleeding actually did not start until Saturday and persisted  through this AM. Literally just stopped when got here. Had been changing dressing every 10-15 minutes.  Had a towel that was bloodsoaked in softball sized patch.  She feels well outside of the bleeding  Missed 1-2dose of penicillin- she will restart.  She was worried that was affecting her INR and caused the bleeding.  A/P: Luckily the bleeding from her dental procedure has stopped.  Her INR is therapeutic in the 2.5-3.5 range for her mechanical valve.  She has lost some blood but is scheduled for labs tomorrow morning including CBC and I think it is reasonable to wait until that time.  She will return for monthly INR checks with our Coumadin clinic.  Due to her mechanical valve history I strongly encouraged her to restart penicillin.  I encouraged her to follow-up with her dentist first if bleeding recurs.  She wanted to make sure her INR was okay before contacting him  Future Appointments  Date Time Provider [place]  10/28/2017  8:45 AM [facility]  04/28/2018  8:30 AM [name], MD [facility]   Lab/Order associations: Long term (current) use of anticoagulants - Plan: POCT INR  Return precautions advised.  Garret Reddish, MD

## 2017-10-27 NOTE — Patient Instructions (Addendum)
Come back for lab tomorrow  I will let Jenny Reichmann Know Inr looks great. Schedule a 1 month INR visit with her.   Come back for labs tomorrow. If you have more bleeding please let me know through mychart.

## 2017-10-27 NOTE — Telephone Encounter (Signed)
She called in wanting to come into the Coumadin clinic for an INR.   However she mentioned she had dental surgery last week.  They cut around one of her wisdom teeth and cauterized it.   Was fine until she started bleeding yesterday morning and has not been able to get it under control.   She is using Telfa pads and changing them every 10-15 minutes.   I called [facility] where she goes to the Coumadin clinic.   Due to the bleeding they advised she see Dr. [name] first.   I spoke with the flow coordinator about the situation at [place] office  and they are going to schedule her for this morning at 11:30 with Dr. [name].   They can also check her INR then.    I made the pt aware of this.    She is supposed to see the dentist today also.     Pt was agreeable to this plan however she really didn't feel like she needed to be seen by Dr. [name].   I let her know after talking with the flow coordinator at [facility] that the RN in the Coumadin clinic could not treat your bleeding.  That's why they advised you see Dr. [name].

## 2017-10-28 ENCOUNTER — Other Ambulatory Visit (INDEPENDENT_AMBULATORY_CARE_PROVIDER_SITE_OTHER): Payer: Medicare Other

## 2017-10-28 ENCOUNTER — Ambulatory Visit: Payer: Medicare Other

## 2017-10-28 DIAGNOSIS — I10 Essential (primary) hypertension: Secondary | ICD-10-CM | POA: Diagnosis not present

## 2017-10-28 DIAGNOSIS — M810 Age-related osteoporosis without current pathological fracture: Secondary | ICD-10-CM

## 2017-10-28 DIAGNOSIS — E039 Hypothyroidism, unspecified: Secondary | ICD-10-CM

## 2017-10-28 LAB — COMPREHENSIVE METABOLIC PANEL
ALT: 21 U/L (ref 0–35)
AST: 29 U/L (ref 0–37)
Albumin: 4.4 g/dL (ref 3.5–5.2)
Alkaline Phosphatase: 63 U/L (ref 39–117)
BUN: 24 mg/dL — ABNORMAL HIGH (ref 6–23)
CO2: 33 mEq/L — ABNORMAL HIGH (ref 19–32)
Calcium: 11.3 mg/dL — ABNORMAL HIGH (ref 8.4–10.5)
Chloride: 100 mEq/L (ref 96–112)
Creatinine, Ser: 1.53 mg/dL — ABNORMAL HIGH (ref 0.40–1.20)
GFR: 35.95 mL/min — ABNORMAL LOW (ref >60.00)
Glucose, Bld: 93 mg/dL (ref 70–99)
Potassium: 4.2 mEq/L (ref 3.5–5.1)
Sodium: 142 mEq/L (ref 135–145)
Total Bilirubin: 0.4 mg/dL (ref 0.2–1.2)
Total Protein: 7.7 g/dL (ref 6.0–8.3)

## 2017-10-28 LAB — LIPID PANEL
Cholesterol: 229 mg/dL — ABNORMAL HIGH (ref 0–200)
HDL: 56.3 mg/dL (ref >39.00)
LDL Cholesterol: 146 mg/dL — ABNORMAL HIGH (ref 0–99)
NonHDL: 172.85
Total CHOL/HDL Ratio: 4
Triglycerides: 133 mg/dL (ref 0.0–149.0)
VLDL: 26.6 mg/dL (ref 0.0–40.0)

## 2017-10-28 LAB — CBC
HCT: 36.1 % (ref 36.0–46.0)
Hemoglobin: 11.6 g/dL — ABNORMAL LOW (ref 12.0–15.0)
MCHC: 32.3 g/dL (ref 30.0–36.0)
MCV: 91 fl (ref 78.0–100.0)
Platelets: 439 10*3/uL — ABNORMAL HIGH (ref 150.0–400.0)
RBC: 3.96 Mil/uL (ref 3.87–5.11)
RDW: 15 % (ref 11.5–15.5)
WBC: 11.8 10*3/uL — ABNORMAL HIGH (ref 4.0–10.5)

## 2017-10-28 LAB — TSH: TSH: 0.62 u[IU]/mL (ref 0.35–4.50)

## 2017-10-28 LAB — VITAMIN D 25 HYDROXY (VIT D DEFICIENCY, FRACTURES): VITD: 59.8 ng/mL (ref 30.00–100.00)

## 2017-10-28 NOTE — Progress Notes (Signed)
Your cholesterol has increased over the last year.  Team if she is willing let us try atorvastatin 10 mg once a week #13 with 3 refills.  Your CBC was largely stable (blood counts, infection fighting cells, platelets) with very slightly elevated white blood cell count which we can repeat at next visit, very slightly high platelets, slight anemia but no recent worsening. Your CMET was normal (kidney, liver, and electrolytes, blood sugar) with moderate but stable decrease in kidney function as well as slightly high calcium.  Team-please order ionized calcium and PTH level under elevated calcium and have patient return for labs. Your vitamin D was normal Your thyroid was normal.

## 2017-10-29 ENCOUNTER — Other Ambulatory Visit: Payer: Self-pay

## 2017-10-31 ENCOUNTER — Telehealth: Payer: Self-pay | Admitting: Family Medicine

## 2017-10-31 NOTE — Telephone Encounter (Signed)
See note.   Copied from [place] [id number]. Topic: Quick Communication - Office Called Patient >> Oct 31, 2017  3:03 PM Everrett Coombe, CMA wrote: Reason for CRM: Per Dr. [name] on 10/30/17: We could try zetia 10mg  daily #30 with 3 refills if she agrees. This is a non statin cholesterol medicine >> Oct 31, 2017  3:40 PM Carolyn Stare wrote:   Pt return [facility] call and is aware of what Dr [name] suggested. She said let her think about it and she will call back

## 2017-11-03 NOTE — Telephone Encounter (Signed)
Noted  

## 2017-11-14 ENCOUNTER — Other Ambulatory Visit: Payer: Self-pay | Admitting: Family Medicine

## 2017-11-17 ENCOUNTER — Other Ambulatory Visit: Payer: Self-pay | Admitting: General Practice

## 2017-11-17 MED ORDER — WARFARIN SODIUM 5 MG PO TABS: 5.0000 mg | ORAL_TABLET | Freq: Every day | ORAL | 1 refills | Status: DC

## 2017-11-19 ENCOUNTER — Other Ambulatory Visit: Payer: Medicare Other

## 2017-11-25 ENCOUNTER — Other Ambulatory Visit: Payer: Medicare Other

## 2017-11-25 ENCOUNTER — Ambulatory Visit: Payer: Medicare Other | Admitting: General Practice

## 2017-11-25 ENCOUNTER — Ambulatory Visit: Payer: Medicare Other

## 2017-11-25 DIAGNOSIS — Z7901 Long term (current) use of anticoagulants: Secondary | ICD-10-CM

## 2017-11-25 DIAGNOSIS — Z952 Presence of prosthetic heart valve: Secondary | ICD-10-CM

## 2017-11-25 LAB — POCT INR: INR: 2.5 (ref 2.0–3.0)

## 2017-11-25 NOTE — Patient Instructions (Addendum)
Pre visit review using our clinic review tool, if applicable. No additional management support is needed unless otherwise documented below in the visit note.  Continue 1 tablet daily . Recheck in 4 weeks.  

## 2017-11-26 ENCOUNTER — Other Ambulatory Visit: Payer: Self-pay | Admitting: Family Medicine

## 2017-11-26 LAB — PTH, INTACT AND CALCIUM
Calcium: 11 mg/dL — ABNORMAL HIGH (ref 8.6–10.4)
PTH: 16 pg/mL (ref 14–64)

## 2017-11-26 LAB — CALCIUM, IONIZED: Calcium, Ion: 5.8 mg/dL — ABNORMAL HIGH (ref 4.8–5.6)

## 2017-11-28 ENCOUNTER — Other Ambulatory Visit: Payer: Self-pay

## 2017-11-28 DIAGNOSIS — M810 Age-related osteoporosis without current pathological fracture: Secondary | ICD-10-CM

## 2017-12-03 ENCOUNTER — Other Ambulatory Visit (INDEPENDENT_AMBULATORY_CARE_PROVIDER_SITE_OTHER): Payer: Medicare Other

## 2017-12-03 LAB — VITAMIN D 25 HYDROXY (VIT D DEFICIENCY, FRACTURES): VITD: 58.08 ng/mL (ref 30.00–100.00)

## 2017-12-11 ENCOUNTER — Other Ambulatory Visit: Payer: Self-pay | Admitting: Family Medicine

## 2017-12-11 LAB — VITAMIN D 1,25 DIHYDROXY
Vitamin D 1, 25 (OH)2 Total: 27 pg/mL (ref 18–72)
Vitamin D2 1, 25 (OH)2: <8 pg/mL
Vitamin D3 1, 25 (OH)2: 27 pg/mL

## 2017-12-11 LAB — PTH-RELATED PEPTIDE: PTH-Related Protein (PTH-RP): 13 pg/mL — ABNORMAL LOW (ref 14–27)

## 2017-12-12 ENCOUNTER — Encounter: Payer: Self-pay | Admitting: Family Medicine

## 2017-12-15 NOTE — Telephone Encounter (Signed)
Grace [name], can you see why pt has not been contacted yet by Endo for Dr. [name]. Thanks

## 2017-12-16 ENCOUNTER — Other Ambulatory Visit (INDEPENDENT_AMBULATORY_CARE_PROVIDER_SITE_OTHER): Payer: Medicare Other

## 2017-12-17 ENCOUNTER — Other Ambulatory Visit: Payer: Medicare Other

## 2017-12-17 LAB — PROTEIN ELECTROPHORESIS, URINE REFLEX
Albumin ELP, Urine: 0 %
Alpha-1-Globulin, U: 0 %
Alpha-2-Globulin, U: 0 %
Beta Globulin, U: 0 %
Gamma Globulin, U: 0 %
Protein, Ur: 4.4 mg/dL

## 2017-12-18 LAB — PROTEIN ELECTROPHORESIS, SERUM
Albumin ELP: 4.4 g/dL (ref 3.8–4.8)
Alpha 1: 0.3 g/dL (ref 0.2–0.3)
Alpha 2: 0.6 g/dL (ref 0.5–0.9)
Beta 2: 0.4 g/dL (ref 0.2–0.5)
Beta Globulin: 0.6 g/dL (ref 0.4–0.6)
Gamma Globulin: 0.9 g/dL (ref 0.8–1.7)
Total Protein: 7.2 g/dL (ref 6.1–8.1)

## 2017-12-19 ENCOUNTER — Other Ambulatory Visit: Payer: Self-pay

## 2017-12-19 DIAGNOSIS — M810 Age-related osteoporosis without current pathological fracture: Secondary | ICD-10-CM

## 2017-12-19 LAB — KAPPA/LAMBDA LIGHT CHAINS, FREE, WITH RATIO, 24HR. URINE
KAPPA Ligh Chain, Free U: 27.1 mg/L — ABNORMAL HIGH (ref 1.35–24.19)
KAPPA/LAMBDA Light Chains Free w/Ratio,Ur: 17.95 — ABNORMAL HIGH (ref 2.04–10.37)
Lambda Light Chain, Free U: 1.51 mg/L (ref 0.24–6.66)

## 2017-12-22 ENCOUNTER — Telehealth: Payer: Self-pay | Admitting: Family Medicine

## 2017-12-22 NOTE — Telephone Encounter (Signed)
Noted  

## 2017-12-22 NOTE — Telephone Encounter (Signed)
Copied from [place] [id number]. Topic: Inquiry >> Dec 22, 2017  4:17 PM Oliver Pila B wrote: Reason for CRM: pt called to let pcp know that she has an appt w/ endo tomorrow; pt will schedule next appt at pcp facility after her 8.9.19 appt @ [facility]

## 2017-12-23 ENCOUNTER — Encounter: Payer: Self-pay | Admitting: Endocrinology

## 2017-12-23 ENCOUNTER — Ambulatory Visit (INDEPENDENT_AMBULATORY_CARE_PROVIDER_SITE_OTHER): Payer: Medicare Other | Admitting: Endocrinology

## 2017-12-23 LAB — PHOSPHORUS: Phosphorus: 3.8 mg/dL (ref 2.3–4.6)

## 2017-12-23 NOTE — Progress Notes (Signed)
Subjective:    Patient ID: Grace [name], female    DOB: 21-Jul-1950, 67 y.o.   MRN: [id number]  HPI Pt is referred by Dr [name], for hypercalcemia.  Pt was noted to have moderate hypercalcemia in 2018 (it was normal earlier in 2018). she has never had urolithiasis, parathyroid probs, sarcoidosis, PUD, pancreatitis, depression, or bony fracture.  she does not take vitamin A supplements.  She has never had surgery to the neck.  Pt denies taking antacids, Li++, or HCTZ.  She has slight leg swelling, but no assoc sob.  Past Medical History:  Diagnosis Date  . Anemia   . Aortic insufficiency   . AORTIC VALVE REPLACEMENT, HX OF 04/27/2007   Qualifier: Diagnosis of  By: Leanne Chang MD, Bruce    . Cancer (HCC)    breast  . CHF (congestive heart failure) ([place])   . Congestive heart failure ([place]) 08/03/2009   [facility] Dr. Paul Half. Last echo 2010 with EF 30%. Restrictive due to radiation. AVR MVR. 02/2014 visit planned.    . GI bleed   . Hodgkin's disease   . Hyperlipidemia   . Hypothyroidism   . Migraine   . MITRAL VALVE REPLACEMENT, HX OF 04/27/2007   Qualifier: Diagnosis of  By: Leanne Chang MD, Bruce    . WPW (Wolff-Parkinson-White syndrome)     Past Surgical History:  Procedure Laterality Date  . AORTIC VALVE SURGERY    . bilateral breast implants    . ENDOMETRIAL ABLATION    . MASTECTOMY    . MITRAL VALVE REPLACEMENT    . pericardial stripping  9/08  . SPLENECTOMY    . THORACOTOMY    . TONSILLECTOMY    . valvulopathy  06/13/06    Social History   Socioeconomic History  . Marital status: Married    Spouse name: Not on file  . Number of children: Not on file  . Years of education: Not on file  . Highest education level: Not on file  Occupational History  . Not on file  Social Needs  . Financial resource strain: Not on file  . Food insecurity:    Worry: Not on file    Inability: Not on file  . Transportation needs:    Medical: Not on file    Non-medical: Not on file  Tobacco Use    . Smoking status: Never Smoker  . Smokeless tobacco: Never Used  Substance and Sexual Activity  . Alcohol use: No  . Drug use: No  . Sexual activity: Not on file  Lifestyle  . Physical activity:    Days per week: Not on file    Minutes per session: Not on file  . Stress: Not on file  Relationships  . Social connections:    Talks on phone: Not on file    Gets together: Not on file    Attends religious service: Not on file    Active member of club or organization: Not on file    Attends meetings of clubs or organizations: Not on file    Relationship status: Not on file  . Intimate partner violence:    Fear of current or ex partner: Not on file    Emotionally abused: Not on file    Physically abused: Not on file    Forced sexual activity: Not on file  Other Topics Concern  . Not on file  Social History Narrative   Lived in [place] for 18 years. Moved here for the weather.    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      Husband retired January 2015. Married 1991 (2nd marriage). No kids.       Hobbies: walks 2 miles per day, read, work outside          Current Outpatient Medications on File Prior to Visit  Medication Sig Dispense Refill  . aspirin 81 MG tablet Take 81 mg by mouth daily.      Marland Kitchen aspirin-acetaminophen-caffeine (EXCEDRIN MIGRAINE) 250-250-65 MG tablet Take 2 tablets by mouth every 6 (six) hours as needed for headache.    . diphenhydramine-acetaminophen (TYLENOL PM) 25-500 MG TABS tablet Take 1 tablet by mouth at bedtime as needed.    . docusate sodium (COLACE) 100 MG capsule Take 100 mg by mouth daily.     . furosemide (LASIX) 40 MG tablet TAKE 1 TABLET BY MOUTH  DAILY 90 tablet 1  . ibandronate (BONIVA) 150 MG tablet Take 1 tablet (150 mg total) by mouth every 30 (thirty) days. In AM w. full glass of h20 and do not take anything else by mouth for 30 mins 3 tablet 3  . levothyroxine (SYNTHROID,  LEVOTHROID) 112 MCG tablet TAKE 1 TABLET BY MOUTH  DAILY 90 tablet 3  . Magnesium 250 MG TABS Take 1 tablet by mouth daily.    . metoprolol (TOPROL-XL) 50 MG 24 hr tablet Take 50 mg by mouth 2 (two) times daily.     . Multiple Vitamin (MULTI-VITAMIN DAILY) TABS Take by mouth daily.    . potassium chloride SA (K-DUR,KLOR-CON) 20 MEQ tablet TAKE 1 TABLET BY MOUTH  DAILY 90 tablet 1  . spironolactone (ALDACTONE) 25 MG tablet Take 50 mg by mouth daily.     Marland Kitchen warfarin (COUMADIN) 5 MG tablet TAKE 1 TABLET BY MOUTH  DAILY 90 tablet 1   No current facility-administered medications on file prior to visit.     Allergies  Allergen Reactions  . Digoxin Other (See Comments)  . Statins     myalgia    Family History  Problem Relation Age of Onset  . Heart disease Mother   . Cancer Mother        breast  . Heart disease Maternal Aunt   . Cancer Maternal Aunt        uterine  . Heart disease Maternal Grandmother   . Diabetes Maternal Grandmother   . Hypercalcemia Neg Hx     BP 126/80 (BP Location: Right Arm, Patient Position: Sitting, Cuff Size: Normal)   Pulse 92   Temp 97.7 F (36.5 C) (Oral)   Ht 5' 5.5" (1.664 m)   Wt 135 lb (61.2 kg)   SpO2 97%   BMI 22.12 kg/m       Review of Systems Denies weight loss, visual loss, cough, diarrhea, sore throat, polyuria, hematuria, rash, depression, numbness, and back pain.      Objective:   Physical Exam VS: see vs page GEN: no distress HEAD: head: no deformity eyes: no periorbital swelling, no proptosis external nose and ears are normal mouth: no lesion seen NECK: supple, thyroid is not enlarged CHEST WALL: no deformity.  No kyphosis.  Old healed surgical scar (median sternotomy) LUNGS: clear to auscultation CV: reg rate and rhythm, no murmur, but loud valve click is heard.  ABD: abdomen is soft, nontender.  no hepatosplenomegaly.  not distended.  no hernia MUSCULOSKELETAL: muscle bulk and strength are grossly normal.  no obvious  joint swelling.  gait is normal and steady EXTEMITIES: no deformity.  no ulcer on the feet.  feet are of normal color and temp.  Trace bilat leg edema, and bilat vv's PULSES: dorsalis pedis intact bilat.  no carotid bruit NEURO:  cn 2-12 grossly intact.   readily moves all 4's.  sensation is intact to touch on the feet SKIN:  Normal texture and temperature.  No rash or suspicious lesion is visible.   NODES:  None palpable at the neck PSYCH: alert, well-oriented.  Does not appear anxious nor depressed.      Lab Results  Component Value Date   PTH 16 11/25/2017   CALCIUM 11.0 (H) 11/25/2017   CAION 5.8 (H) 11/25/2017   Lab Results  Component Value Date   ALT 21 10/28/2017   AST 29 10/28/2017   ALKPHOS 63 10/28/2017   BILITOT 0.4 10/28/2017   I have reviewed outside records, and summarized: Pt was noted to have elevated Ca++, and referred here.  Main problem addressed was periodontal bleeding.      Assessment & Plan:  Hypercalcemia: new, uncertain etiology.    Patient Instructions  blood tests, and a 24-HR urine test, are requested for you today.  We'll let you know about the results.

## 2017-12-23 NOTE — Patient Instructions (Addendum)
blood tests, and a 24-HR urine test, are requested for you today.  We'll let you know about the results.

## 2017-12-26 LAB — VITAMIN A: Vitamin A (Retinoic Acid): 99 ug/dL — ABNORMAL HIGH (ref 38–98)

## 2018-01-02 ENCOUNTER — Ambulatory Visit: Payer: Medicare Other | Admitting: General Practice

## 2018-01-02 ENCOUNTER — Other Ambulatory Visit: Payer: Medicare Other

## 2018-01-02 ENCOUNTER — Other Ambulatory Visit (INDEPENDENT_AMBULATORY_CARE_PROVIDER_SITE_OTHER): Payer: Medicare Other

## 2018-01-02 DIAGNOSIS — Z952 Presence of prosthetic heart valve: Secondary | ICD-10-CM

## 2018-01-02 DIAGNOSIS — Z7901 Long term (current) use of anticoagulants: Secondary | ICD-10-CM

## 2018-01-02 LAB — BASIC METABOLIC PANEL
BUN: 17 mg/dL (ref 6–23)
CO2: 26 mEq/L (ref 19–32)
Calcium: 10.1 mg/dL (ref 8.4–10.5)
Chloride: 104 mEq/L (ref 96–112)
Creatinine, Ser: 1.31 mg/dL — ABNORMAL HIGH (ref 0.40–1.20)
GFR: 42.98 mL/min — ABNORMAL LOW (ref >60.00)
Glucose, Bld: 86 mg/dL (ref 70–99)
Potassium: 3.9 mEq/L (ref 3.5–5.1)
Sodium: 142 mEq/L (ref 135–145)

## 2018-01-02 LAB — POCT INR: INR: 2.8 (ref 2.0–3.0)

## 2018-01-02 NOTE — Patient Instructions (Signed)
Pre visit review using our clinic review tool, if applicable. No additional management support is needed unless otherwise documented below in the visit note.  Continue 1 tablet daily . Recheck in 4 weeks.  

## 2018-01-03 LAB — CALCIUM, URINE, 24 HOUR: Calcium, 24H Urine: 161 mg/24 h

## 2018-01-05 LAB — KAPPA/LAMBDA LIGHT CHAINS
Kappa free light chain: 34 mg/L — ABNORMAL HIGH (ref 3.3–19.4)
Kappa:Lambda Ratio: 1.43 (ref 0.26–1.65)
Lambda Free Lght Chn: 23.8 mg/L (ref 5.7–26.3)

## 2018-01-24 ENCOUNTER — Other Ambulatory Visit: Payer: Self-pay | Admitting: Family Medicine

## 2018-01-26 ENCOUNTER — Other Ambulatory Visit: Payer: Self-pay | Admitting: Family Medicine

## 2018-01-30 ENCOUNTER — Ambulatory Visit: Payer: Medicare Other | Admitting: General Practice

## 2018-01-30 DIAGNOSIS — Z7901 Long term (current) use of anticoagulants: Secondary | ICD-10-CM

## 2018-01-30 DIAGNOSIS — Z952 Presence of prosthetic heart valve: Secondary | ICD-10-CM | POA: Diagnosis not present

## 2018-01-30 LAB — POCT INR: INR: 3.3 — AB (ref 2.0–3.0)

## 2018-01-30 NOTE — Patient Instructions (Signed)
Pre visit review using our clinic review tool, if applicable. No additional management support is needed unless otherwise documented below in the visit note.  Continue 1 tablet daily . Recheck in 4 weeks.  

## 2018-02-27 ENCOUNTER — Ambulatory Visit: Payer: Medicare Other | Admitting: General Practice

## 2018-02-27 DIAGNOSIS — Z7901 Long term (current) use of anticoagulants: Secondary | ICD-10-CM

## 2018-02-27 DIAGNOSIS — Z952 Presence of prosthetic heart valve: Secondary | ICD-10-CM

## 2018-02-27 DIAGNOSIS — Z23 Encounter for immunization: Secondary | ICD-10-CM | POA: Diagnosis not present

## 2018-02-27 LAB — POCT INR: INR: 3.5 — AB (ref 2.0–3.0)

## 2018-02-27 NOTE — Patient Instructions (Signed)
Pre visit review using our clinic review tool, if applicable. No additional management support is needed unless otherwise documented below in the visit note.  Hold dosage today and then continue 1 tablet daily.  Re-check in 4 weeks.

## 2018-02-27 NOTE — Progress Notes (Signed)
Agree with management.  Binnie Rail, MD    Injection given.   Binnie Rail, MD

## 2018-03-16 LAB — HM MAMMOGRAPHY

## 2018-03-17 ENCOUNTER — Ambulatory Visit: Payer: Medicare Other | Admitting: General Practice

## 2018-03-17 DIAGNOSIS — Z952 Presence of prosthetic heart valve: Secondary | ICD-10-CM

## 2018-03-17 DIAGNOSIS — Z7901 Long term (current) use of anticoagulants: Secondary | ICD-10-CM

## 2018-03-17 LAB — POCT INR: INR: 3.4 — AB (ref 2.0–3.0)

## 2018-03-17 NOTE — Patient Instructions (Signed)
Pre visit review using our clinic review tool, if applicable. No additional management support is needed unless otherwise documented below in the visit note.  Hold dosage today and then continue 1 tablet daily.  Re-check in 4 weeks.

## 2018-03-18 ENCOUNTER — Encounter: Payer: Self-pay | Admitting: Family Medicine

## 2018-04-04 ENCOUNTER — Other Ambulatory Visit: Payer: Self-pay | Admitting: Family Medicine

## 2018-04-06 ENCOUNTER — Other Ambulatory Visit: Payer: Self-pay | Admitting: General Practice

## 2018-04-17 ENCOUNTER — Ambulatory Visit: Payer: Medicare Other | Admitting: General Practice

## 2018-04-17 DIAGNOSIS — Z952 Presence of prosthetic heart valve: Secondary | ICD-10-CM | POA: Diagnosis not present

## 2018-04-17 DIAGNOSIS — Z7901 Long term (current) use of anticoagulants: Secondary | ICD-10-CM

## 2018-04-17 LAB — POCT INR: INR: 2.3 (ref 2.0–3.0)

## 2018-04-17 NOTE — Patient Instructions (Addendum)
Pre visit review using our clinic review tool, if applicable. No additional management support is needed unless otherwise documented below in the visit note.  Take extra 1/2 tablet today and then continue 1 tablet daily.  Re-check in 2 weeks.

## 2018-04-27 NOTE — Progress Notes (Signed)
Subjective:  Grace [name] is a 67 y.o. year old very pleasant female patient who presents for/with See problem oriented charting ROS- has had shortness of breath- feels liek cant take good deep breath. No increased edema overall- sometimes has at night. Has had some weight gain. No cough, congestion. Feels more fatigued.    Past Medical History-  Patient Active Problem List   Diagnosis Date Noted  . Atrial flutter ([place]) 12/20/2016    Priority: High  . Osteoporosis. check vitamin D yearly. 01/17/2014    Priority: High  . H/O aortic valve replacement and mitral valve replacement. 01/07/2013    Priority: High  . WOLFF (WOLFE)-PARKINSON-WHITE (WPW) SYNDROME 08/16/2009    Priority: High  . Congestive heart failure ([place]) 08/03/2009    Priority: High  . TRANSIENT ISCHEMIC ATTACKS, HX OF 04/27/2007    Priority: High  . Hypertension 12/20/2016    Priority: Medium  . CKD (chronic kidney disease), stage III ([place]) 01/18/2014    Priority: Medium  . Iron deficiency anemia 08/16/2009    Priority: Medium  . Hyperlipidemia 08/29/2008    Priority: Medium  . Hypothyroidism 11/03/2006    Priority: Medium  . COMMON MIGRAINE 11/03/2006    Priority: Medium  . BREAST CANCER, HX OF 11/03/2006    Priority: Medium  . History of Hodgkin's disease 11/03/2006    Priority: Medium  . Long term (current) use of anticoagulants 12/23/2012    Priority: Low  . GASTROINTESTINAL HEMORRHAGE, HX OF 11/03/2006    Priority: Low  . Hypercalcemia 12/23/2017    Medications- reviewed and updated Current Outpatient Medications  Medication Sig Dispense Refill  . ezetimibe (ZETIA) 10 MG tablet Take by mouth.    Marland Kitchen aspirin 81 MG tablet Take 81 mg by mouth daily.      Marland Kitchen aspirin-acetaminophen-caffeine (EXCEDRIN MIGRAINE) 250-250-65 MG tablet Take 2 tablets by mouth every 6 (six) hours as needed for headache.    . diphenhydramine-acetaminophen (TYLENOL PM) 25-500 MG TABS tablet Take 1 tablet by mouth at bedtime as  needed.    . docusate sodium (COLACE) 100 MG capsule Take 100 mg by mouth daily.     . furosemide (LASIX) 40 MG tablet TAKE 1 TABLET BY MOUTH  DAILY 90 tablet 1  . ibandronate (BONIVA) 150 MG tablet Take 1 tablet (150 mg total) by mouth every 30 (thirty) days. In AM w. full glass of h20 and do not take anything else by mouth for 30 mins 3 tablet 3  . levothyroxine (SYNTHROID, LEVOTHROID) 112 MCG tablet TAKE 1 TABLET BY MOUTH  DAILY 90 tablet 3  . Magnesium 250 MG TABS Take 1 tablet by mouth daily.    . metoprolol (TOPROL-XL) 50 MG 24 hr tablet Take 50 mg by mouth 2 (two) times daily.     . Multiple Vitamin (MULTI-VITAMIN DAILY) TABS Take by mouth daily.    . potassium chloride SA (K-DUR,KLOR-CON) 20 MEQ tablet TAKE 1 TABLET BY MOUTH  DAILY 90 tablet 0  . spironolactone (ALDACTONE) 25 MG tablet Take 50 mg by mouth daily.     Marland Kitchen warfarin (COUMADIN) 5 MG tablet Take 1 tablet daily or AS DIRECTED BY ANTICOAGULATION CLINIC 90 DAY 95 tablet 1   No current facility-administered medications for this visit.     Objective: BP 132/84 (BP Location: Left Arm, Cuff Size: Normal)   Pulse 81   Temp 97.6 F (36.4 C) (Oral)   Ht 5' 5.5" (1.664 m)   Wt 141 lb (64 kg)   LMP  (  LMP Unknown)   SpO2 99%   BMI 23.11 kg/m  Gen: NAD, resting comfortably CV: RRR - loud mechanical murmur audible without stethoscope Lungs: CTAB no crackles, wheeze, rhonchi Abdomen: soft/nontender/nondistended/normal bowel sounds.thin Ext: no edema Skin: warm, dry Neuro: speech normal, moves all extremities  Assessment/Plan:   Shortness of breath  s: some cramping in her legs after INR going low about 2 weeks ago. She is set up this Friday for repeat INR. Noted some chest tightness around the same time- feels constricted like cant take good deep breath but no chest pain. Feels more shortness of breath in general lately though. No increased edema. Up about 6 lbs in last 3-4 months. Does admit was doing some more sedentary work  at home transitioning to new devices and admits was eating more so think shtat contributed.    Follows with [facility] for WPW syndrome. Has aortic and mitral valve replacmeent mechanical so on long term coumadin. On coumadin for a fib- working with cardiology on treatment options for the a fib itself. Has restrictive CHF- lasix 40mg , spirinolactone 25mg  daily.   After reflection did have a few seconds of pain under shoulder pain which has now resolved   She states has been more sedentary in general for 2 months- has not been doing her walking. Not walking now due to shortness of breath. Got a new treadmill a month ago but has felt too short of breath to try it.  A/P: 67 year old female with history of restrictive cardiomyopathy, iron deficiency anemia years ago with normal colonoscopies in late 1990s, congestive heart failure presenting with increasing shortness of breath-found to have recurrent anemia.  On chest x-ray pleural abnormality noted-we will get chest CT.  I am concerned about some fluid overload so we will trial Lasix twice daily instead of daily for 3 days.  We will need to approach next steps once the above steps are taken. -Heart rate in normal range- doubt atrial flutter as cause of symptoms-she will continue her warfarin which is now back in therapeutic range - With INR above 2 strongly doubt something like pulmonary embolism -Anemia certainly could be the cause of symptoms-referred back to GI today-changed priority today to urgent-want their opinion on if virtual colonoscopy is adequate or if she will need true colonoscopy- due to warfarin she would likely need bridging.  Hyperlipidemia S: poorly controlled on no statin- she has declined statin due to myalgias in past. Suggested possibly zetia last visit and she wanted to think about this- she discussed this with [facility] and started it at least 6 weeks ago if not longer Lab Results  Component Value Date   CHOL 229 (H) 10/28/2017    HDL 56.30 10/28/2017   LDLCALC 146 (H) 10/28/2017   LDLDIRECT 101.0 03/16/2015   TRIG 133.0 10/28/2017   CHOLHDL 4 10/28/2017   A/P: hopefully improved on zetia 10mg - check direct LDL today with labs  Hypothyroidism S: On thyroid medication-  Levothyroxine 112 mcg Lab Results  Component Value Date   TSH 0.62 10/28/2017  A/P: has been stable- update tsh with labs  CKD (chronic kidney disease), stage III S:  GFR in 38s or 40s. Knows to avoid nsaids A/P: update bmet with labs- with cramping reasonable to check electrolytes  History of Hodgkin's disease 1970s. Diagnosed at age 37 or 36. MOP chemo-radiation from mouth to pelvis essentially.  Patient does have some CBC abnormalities-we will get CBC with differential as well as pathologist smear review-particularly given history of lymphoma.  May need to get her plugged back into oncology depending on further work-up   Future Appointments  Date Time Provider [employer]  05/01/2018  8:00 AM [facility]  10/08/2018  9:00 AM [facility] HEALTH COACH [facility]   No follow-ups on file.  Lab/Order associations: FASTING Shortness of breath - Plan: DG Chest 2 View, CT Chest W Contrast  Anemia, unspecified type - Plan: CBC with Differential/Platelet, Pathologist smear review, Iron, TIBC and Ferritin Panel  Pleural plaque - Plan: CT Chest W Contrast  Hypothyroidism, unspecified type - Plan: TSH, TSH  Hyperlipidemia, unspecified hyperlipidemia type - Plan: Basic metabolic panel, CBC, LDL cholesterol, direct, CBC, Basic metabolic panel  Chronic systolic congestive heart failure (HCC), Chronic - Plan: B Nat Peptide, Magnesium  Typical atrial flutter (HCC), Chronic  CKD (chronic kidney disease), stage III ([place])  Long term (current) use of anticoagulants - Plan: Protime-INR  Screen for colon cancer - Plan: Ambulatory referral to Gastroenterology  History of Hodgkin's disease  Return precautions  advised.  Garret Reddish, MD

## 2018-04-27 NOTE — Patient Instructions (Addendum)
Health Maintenance Due  Topic Date Due  . CT Virtual Colonoscopy - virtual in 2014- needs repeat this year per USPTF guidelines- will refer back to GI 07/13/2000   I will reach out to you about labs- when we come up with a plan - I want you to inform [facility] about our plan and your symptoms.   Please stop by lab before you go as well as x-ray

## 2018-04-28 ENCOUNTER — Telehealth: Payer: Self-pay | Admitting: Radiology

## 2018-04-28 ENCOUNTER — Ambulatory Visit: Payer: Medicare Other | Admitting: Family Medicine

## 2018-04-28 ENCOUNTER — Encounter: Payer: Self-pay | Admitting: Family Medicine

## 2018-04-28 ENCOUNTER — Ambulatory Visit (INDEPENDENT_AMBULATORY_CARE_PROVIDER_SITE_OTHER): Payer: Medicare Other

## 2018-04-28 VITALS — BP 132/84 | HR 81 | Temp 97.6°F | Ht 65.5 in | Wt 141.0 lb

## 2018-04-28 DIAGNOSIS — I5022 Chronic systolic (congestive) heart failure: Secondary | ICD-10-CM

## 2018-04-28 DIAGNOSIS — E039 Hypothyroidism, unspecified: Secondary | ICD-10-CM

## 2018-04-28 DIAGNOSIS — E785 Hyperlipidemia, unspecified: Secondary | ICD-10-CM | POA: Diagnosis not present

## 2018-04-28 DIAGNOSIS — R0602 Shortness of breath: Secondary | ICD-10-CM

## 2018-04-28 DIAGNOSIS — Z1211 Encounter for screening for malignant neoplasm of colon: Secondary | ICD-10-CM

## 2018-04-28 DIAGNOSIS — N183 Chronic kidney disease, stage 3 unspecified: Secondary | ICD-10-CM

## 2018-04-28 DIAGNOSIS — Z7901 Long term (current) use of anticoagulants: Secondary | ICD-10-CM | POA: Diagnosis not present

## 2018-04-28 DIAGNOSIS — Z8571 Personal history of Hodgkin lymphoma: Secondary | ICD-10-CM

## 2018-04-28 DIAGNOSIS — J929 Pleural plaque without asbestos: Secondary | ICD-10-CM

## 2018-04-28 DIAGNOSIS — D649 Anemia, unspecified: Secondary | ICD-10-CM

## 2018-04-28 DIAGNOSIS — I483 Typical atrial flutter: Secondary | ICD-10-CM

## 2018-04-28 LAB — BASIC METABOLIC PANEL
BUN: 24 mg/dL — ABNORMAL HIGH (ref 6–23)
CO2: 27 mEq/L (ref 19–32)
Calcium: 9.8 mg/dL (ref 8.4–10.5)
Chloride: 103 mEq/L (ref 96–112)
Creatinine, Ser: 1.38 mg/dL — ABNORMAL HIGH (ref 0.40–1.20)
GFR: 40.44 mL/min — ABNORMAL LOW (ref >60.00)
Glucose, Bld: 88 mg/dL (ref 70–99)
Potassium: 3.9 mEq/L (ref 3.5–5.1)
Sodium: 139 mEq/L (ref 135–145)

## 2018-04-28 LAB — CBC
HCT: 24.4 % — ABNORMAL LOW (ref 36.0–46.0)
Hemoglobin: 7.5 g/dL — CL (ref 12.0–15.0)
MCHC: 30.6 g/dL (ref 30.0–36.0)
MCV: 77.7 fl — ABNORMAL LOW (ref 78.0–100.0)
Platelets: 603 10*3/uL — ABNORMAL HIGH (ref 150.0–400.0)
RBC: 3.14 Mil/uL — ABNORMAL LOW (ref 3.87–5.11)
RDW: 18.1 % — ABNORMAL HIGH (ref 11.5–15.5)
WBC: 12.5 10*3/uL — ABNORMAL HIGH (ref 4.0–10.5)

## 2018-04-28 LAB — MAGNESIUM: Magnesium: 2.4 mg/dL (ref 1.5–2.5)

## 2018-04-28 LAB — TSH: TSH: 1.02 u[IU]/mL (ref 0.35–4.50)

## 2018-04-28 LAB — PROTIME-INR
INR: 2.9 ratio — ABNORMAL HIGH (ref 0.8–1.0)
Prothrombin Time: 32.8 s — ABNORMAL HIGH (ref 9.6–13.1)

## 2018-04-28 LAB — LDL CHOLESTEROL, DIRECT: Direct LDL: 68 mg/dL

## 2018-04-28 LAB — BRAIN NATRIURETIC PEPTIDE: Pro B Natriuretic peptide (BNP): 297 pg/mL — ABNORMAL HIGH (ref 0.0–100.0)

## 2018-04-28 LAB — CHG MAMMOGRAM, SCREENING

## 2018-04-28 NOTE — Assessment & Plan Note (Signed)
S: poorly controlled on no statin- she has declined statin due to myalgias in past. Suggested possibly zetia last visit and she wanted to think about this- she discussed this with [facility] and started it at least 6 weeks ago if not longer Lab Results  Component Value Date   CHOL 229 (H) 10/28/2017   HDL 56.30 10/28/2017   LDLCALC 146 (H) 10/28/2017   LDLDIRECT 101.0 03/16/2015   TRIG 133.0 10/28/2017   CHOLHDL 4 10/28/2017   A/P: hopefully improved on zetia 10mg - check direct LDL today with labs

## 2018-04-28 NOTE — Telephone Encounter (Signed)
CRITICAL VALUE STICKER  CRITICAL VALUE: Hemoglobin 7.5  RECEIVER (on-site recipient of call): Kayren Eaves   DATE & TIME NOTIFIED: 04/28/2018 @ 1527 hrs   MESSENGER (representative from lab): Hope  MD NOTIFIED: Garret Reddish   TIME OF NOTIFICATION: 1530hrs  RESPONSE:

## 2018-04-28 NOTE — Assessment & Plan Note (Signed)
S: On thyroid medication-  Levothyroxine 112 mcg Lab Results  Component Value Date   TSH 0.62 10/28/2017  A/P: has been stable- update tsh with labs

## 2018-04-28 NOTE — Assessment & Plan Note (Signed)
S:  GFR in 30s or 40s. Knows to avoid nsaids A/P: update bmet with labs- with cramping reasonable to check electrolytes

## 2018-04-28 NOTE — Progress Notes (Signed)
Very anemic- please take 1 iron tablet every other day- seems that absorption is better with less frequent dosing.  Usually would find this as ferrous sulfate 325 mg at your pharmacy  White blood cells are also elevated as are platelets- I am ordering repeat labs-team please help her coordinate a lab visit-would really like this to be done Thursday if possible.  INR levels were improved to 2.9--I will CC [place] [facility] on this  Team-can we see if we can get her into GI much sooner to discuss options- with anemia they may suggest this being more aggressive than CT colonoscopy  Bad cholesterol looks excellent.  Magnesium levels normal.  Kidney function essentially stable.  Electrolytes normal.  Thyroid also normal  BNP which is a heart failure test was not very elevated-still want to try Lasix twice a day for 3 days as discussed in chest x-ray note

## 2018-04-28 NOTE — Assessment & Plan Note (Signed)
61s. Diagnosed at age 67 or 32. MOP chemo-radiation from mouth to pelvis essentially.  Patient does have some CBC abnormalities-we will get CBC with differential as well as pathologist smear review-particularly given history of lymphoma.  May need to get her plugged back into oncology depending on further work-up

## 2018-04-29 ENCOUNTER — Ambulatory Visit (HOSPITAL_COMMUNITY)
Admission: RE | Admit: 2018-04-29 | Discharge: 2018-04-29 | Disposition: A | Discharge status: Home / Self Care | Payer: Medicare Other | Source: Ambulatory Visit | Attending: Family Medicine | Admitting: Family Medicine

## 2018-04-29 ENCOUNTER — Encounter (HOSPITAL_COMMUNITY): Payer: Self-pay | Admitting: Radiology

## 2018-04-29 DIAGNOSIS — R0602 Shortness of breath: Secondary | ICD-10-CM | POA: Insufficient documentation

## 2018-04-29 DIAGNOSIS — J929 Pleural plaque without asbestos: Secondary | ICD-10-CM | POA: Diagnosis present

## 2018-04-29 MED ORDER — SODIUM CHLORIDE (PF) 0.9 % IJ SOLN
INTRAMUSCULAR | Status: AC
  Filled 2018-04-29: qty 50

## 2018-04-29 MED ORDER — IOHEXOL 300 MG/ML  SOLN
75.0000 mL | Freq: Once | INTRAMUSCULAR | Status: AC | PRN
  Administered 2018-04-29: 60 mL via INTRAVENOUS

## 2018-04-29 NOTE — Progress Notes (Signed)
Noted.  Patient has appointment for INR re-check on Friday, 12/20.

## 2018-04-29 NOTE — Telephone Encounter (Signed)
Dr. [name] is aware and he spoke with patient yestereday

## 2018-05-01 ENCOUNTER — Other Ambulatory Visit: Payer: Self-pay | Admitting: Family Medicine

## 2018-05-01 ENCOUNTER — Other Ambulatory Visit (INDEPENDENT_AMBULATORY_CARE_PROVIDER_SITE_OTHER): Payer: Medicare Other

## 2018-05-01 ENCOUNTER — Ambulatory Visit: Payer: Medicare Other

## 2018-05-01 DIAGNOSIS — D649 Anemia, unspecified: Secondary | ICD-10-CM | POA: Diagnosis not present

## 2018-05-01 LAB — CBC WITH DIFFERENTIAL/PLATELET
Basophils Absolute: 95 cells/uL (ref 0–200)
Basophils Relative: 0.9 %
Eosinophils Absolute: 265 cells/uL (ref 15–500)
Eosinophils Relative: 2.5 %
HCT: 26 % — ABNORMAL LOW (ref 35.0–45.0)
Hemoglobin: 7.6 g/dL — ABNORMAL LOW (ref 11.7–15.5)
Lymphs Abs: 806 cells/uL — ABNORMAL LOW (ref 850–3900)
MCH: 23.1 pg — ABNORMAL LOW (ref 27.0–33.0)
MCHC: 29.2 g/dL — ABNORMAL LOW (ref 32.0–36.0)
MCV: 79 fL — ABNORMAL LOW (ref 80.0–100.0)
MPV: 10.3 fL (ref 7.5–12.5)
Monocytes Relative: 14.4 %
Neutro Abs: 7908 cells/uL — ABNORMAL HIGH (ref 1500–7800)
Neutrophils Relative %: 74.6 %
Platelets: 567 10*3/uL — ABNORMAL HIGH (ref 140–400)
RBC: 3.29 10*6/uL — ABNORMAL LOW (ref 3.80–5.10)
RDW: 16.6 % — ABNORMAL HIGH (ref 11.0–15.0)
Total Lymphocyte: 7.6 %
WBC mixed population: 1526 cells/uL — ABNORMAL HIGH (ref 200–950)
WBC: 10.6 10*3/uL (ref 3.8–10.8)

## 2018-05-02 LAB — IRON,TIBC AND FERRITIN PANEL
%SAT: 80 % (calc) — ABNORMAL HIGH (ref 16–45)
Ferritin: 12 ng/mL — ABNORMAL LOW (ref 16–288)
Iron: 427 ug/dL — ABNORMAL HIGH (ref 45–160)
TIBC: 536 mcg/dL (calc) — ABNORMAL HIGH (ref 250–450)

## 2018-05-04 ENCOUNTER — Other Ambulatory Visit (INDEPENDENT_AMBULATORY_CARE_PROVIDER_SITE_OTHER): Payer: Medicare Other

## 2018-05-04 ENCOUNTER — Ambulatory Visit: Payer: Medicare Other | Admitting: Physician Assistant

## 2018-05-04 ENCOUNTER — Encounter: Payer: Self-pay | Admitting: Physician Assistant

## 2018-05-04 VITALS — BP 124/70 | HR 92 | Ht 66.0 in | Wt 137.4 lb

## 2018-05-04 DIAGNOSIS — Z7901 Long term (current) use of anticoagulants: Secondary | ICD-10-CM | POA: Diagnosis not present

## 2018-05-04 DIAGNOSIS — D649 Anemia, unspecified: Secondary | ICD-10-CM

## 2018-05-04 DIAGNOSIS — D509 Iron deficiency anemia, unspecified: Secondary | ICD-10-CM | POA: Diagnosis not present

## 2018-05-04 DIAGNOSIS — R0602 Shortness of breath: Secondary | ICD-10-CM

## 2018-05-04 LAB — CBC WITH DIFFERENTIAL/PLATELET
Basophils Relative: 2 % (ref 0.0–3.0)
Eosinophils Relative: 2 % (ref 0.0–5.0)
HCT: 27.3 % — ABNORMAL LOW (ref 36.0–46.0)
Hemoglobin: 8.3 g/dL — ABNORMAL LOW (ref 12.0–15.0)
Lymphocytes Relative: 7 % — ABNORMAL LOW (ref 12.0–46.0)
MCHC: 30.3 g/dL (ref 30.0–36.0)
MCV: 78.4 fl (ref 78.0–100.0)
Monocytes Relative: 9 % (ref 3.0–12.0)
Neutrophils Relative %: 80 % — ABNORMAL HIGH (ref 43.0–77.0)
Platelets: 520 10*3/uL — ABNORMAL HIGH (ref 150.0–400.0)
RBC: 3.48 Mil/uL — ABNORMAL LOW (ref 3.87–5.11)
RDW: 19.7 % — ABNORMAL HIGH (ref 11.5–15.5)
WBC: 11.6 10*3/uL — ABNORMAL HIGH (ref 4.0–10.5)

## 2018-05-04 LAB — FOLATE: Folate: >23.9 ng/mL (ref >5.9)

## 2018-05-04 LAB — RETICULOCYTES
ABS Retic: 124800 cells/uL — ABNORMAL HIGH (ref 20000–8000)
Retic Ct Pct: 3.9 %

## 2018-05-04 LAB — VITAMIN B12: Vitamin B-12: 1434 pg/mL — ABNORMAL HIGH (ref 211–911)

## 2018-05-04 LAB — PATHOLOGIST SMEAR REVIEW

## 2018-05-04 NOTE — Patient Instructions (Signed)
Your provider has requested that you go to the basement level for lab work before leaving today. Press "B" on the elevator. The lab is located at the first door on the left as you exit the elevator.  We have given you hemoccult cards with 3 days of collection on the cards.  Please bring the stool cards back to our basement level labs when you have completed them.  We will call you with the results.  Normal BMI (Body Mass Index- based on height and weight) is between 23 and 30. Your BMI today is Body mass index is 22.17 kg/m. Marland Kitchen Please consider follow up  regarding your BMI with your Primary Care Provider.

## 2018-05-05 ENCOUNTER — Encounter: Payer: Self-pay | Admitting: Physician Assistant

## 2018-05-05 NOTE — Progress Notes (Signed)
Agree with assessment and plan. Iron indices suggest iron deficiency however she also likely has a component of anemia of chronic disease. The significant decrease in hemoglobin over the course the last 2 months is concerning and although the hemoglobin gram is slightly higher than a few days ago I remain concerned. I think it is reasonable to see what the Hemoccults show however it may be difficult for [place] to not pursue endoscopic evaluation. However we have to recognize as the patient does as well that coming off blood thinners in the setting of her dual valve replacement does carry risks and she and her primary care doctor and cardiology team will need to understand and recognize that.  Resection of any polyps would be high risk but could be considered as necessary if they were found and biopsies could be obtained. We will await additional labs and determine based on patient's repeat hemogram and Hemoccult cards.

## 2018-05-05 NOTE — Progress Notes (Signed)
Subjective:    Patient ID: Grace [name], female    DOB: 09/18/1950, 67 y.o.   MRN: [id number]  [place] is a pleasant 67 year old white female, known to Dr. Delfin Edis previously who is referred today by Dr. Garret Reddish for evaluation of new anemia, microcytic, with mixed iron studies. Patient had presented to primary care with complaints of fatigue and shortness of breath on 04/28/2018.  She felt this was likely related to her congestive heart failure.  She had labs done that showed a hemoglobin of 7.5, hematocrit of 24 MCV of 77.  About 6 months ago hemoglobin was 11.6. Subsequent iron studies showed a serum iron of 427, TIBC of 536, saturation of 80 and ferritin of 12. Chest CT showed no pleural mass along the left lateral hemithorax which had been questioned on plain films.  There is no acute cardiopulmonary disease, moderate cardiomegaly moderate to severe three-vessel coronary atherosclerosis.  There was a right retro-crural lymph node in the upper abdomen measuring 1.2 cm.  Patient is on chronic diuretics and her meds were increased for few days and her symptoms significantly improved.  She says she is feeling much better this week and denies shortness of breath.  In fact she is leaving for a trip tomorrow and will be out of town for about a week. She says she has not had any abdominal discomfort, nausea vomiting heartburn or indigestion.  No dysphasia.  Appetite has been fine.  No changes in bowel habits or melena or hematochezia. Patient is on chronic Coumadin as well as aspirin 81 mg daily. He has history of chronic kidney disease, restrictive pericarditis status post remote radiation and had subsequent mechanical aortic valve replacement and mitral valve replacement.  She has been anticoagulated since.  History of atrial flutter, congestive heart failure, WPW and breast cancer. She also had a very remote Hodgkin's lymphoma which she had the radiation therapy years ago and  splenectomy..  Last colonoscopy was done in 2004 per Dr. Olevia Perches which was a normal exam.  She has not had EGD.Marland Kitchen  Repeat labs done on 5 6 showed hemoglobin stable at 7.6 hematocrit of 26, MCV of 79, platelets 567. She is not on any regular NSAIDs, her only new medication is Zetia which she started a few months ago. Family history is negative for GI disease as far she is aware. Patient says that approximately 20 years ago had an episode of severe anemia, she did require blood transfusions and had work-up which was unrevealing to her knowledge.  Review of Systems Pertinent positive and negative review of systems were noted in the above HPI section.  All other review of systems was otherwise negative.  Outpatient Encounter Medications as of 05/04/2018  Medication Sig  . aspirin 81 MG tablet Take 81 mg by mouth daily.    Marland Kitchen aspirin-acetaminophen-caffeine (EXCEDRIN MIGRAINE) 250-250-65 MG tablet Take 2 tablets by mouth every 6 (six) hours as needed for headache.  . diphenhydramine-acetaminophen (TYLENOL PM) 25-500 MG TABS tablet Take 1 tablet by mouth at bedtime as needed.  . docusate sodium (COLACE) 100 MG capsule Take 100 mg by mouth daily.   Marland Kitchen ezetimibe (ZETIA) 10 MG tablet Take 10 mg by mouth daily.   Marland Kitchen FERROUS SULFATE PO Take 65 mg by mouth every other day.  . furosemide (LASIX) 40 MG tablet TAKE 1 TABLET BY MOUTH  DAILY  . ibandronate (BONIVA) 150 MG tablet Take 1 tablet (150 mg total) by mouth every 30 (thirty) days. In  AM w. full glass of h20 and do not take anything else by mouth for 30 mins  . levothyroxine (SYNTHROID, LEVOTHROID) 112 MCG tablet TAKE 1 TABLET BY MOUTH  DAILY  . Magnesium 250 MG TABS Take 1 tablet by mouth daily.  . metoprolol (TOPROL-XL) 50 MG 24 hr tablet Take 50 mg by mouth 2 (two) times daily.   . Multiple Vitamin (MULTI-VITAMIN DAILY) TABS Take by mouth daily.  . potassium chloride SA (K-DUR,KLOR-CON) 20 MEQ tablet TAKE 1 TABLET BY MOUTH  DAILY  . spironolactone  (ALDACTONE) 25 MG tablet Take 25 mg by mouth 2 (two) times daily.   Marland Kitchen warfarin (COUMADIN) 5 MG tablet Take 1 tablet daily or AS DIRECTED BY ANTICOAGULATION CLINIC 90 DAY   No facility-administered encounter medications on file as of 05/04/2018.    Allergies  Allergen Reactions  . Digoxin Other (See Comments)  . Statins     myalgia   Patient Active Problem List   Diagnosis Date Noted  . Hypercalcemia 12/23/2017  . Atrial flutter ([place]) 12/20/2016  . Hypertension 12/20/2016  . CKD (chronic kidney disease), stage III ([place]) 01/18/2014  . Osteoporosis. check vitamin D yearly. 01/17/2014  . H/O aortic valve replacement and mitral valve replacement. 01/07/2013  . Long term (current) use of anticoagulants 12/23/2012  . Iron deficiency anemia 08/16/2009  . WOLFF (WOLFE)-PARKINSON-WHITE (WPW) SYNDROME 08/16/2009  . Congestive heart failure ([place]) 08/03/2009  . Hyperlipidemia 08/29/2008  . TRANSIENT ISCHEMIC ATTACKS, HX OF 04/27/2007  . Hypothyroidism 11/03/2006  . COMMON MIGRAINE 11/03/2006  . BREAST CANCER, HX OF 11/03/2006  . History of Hodgkin's disease 11/03/2006  . GASTROINTESTINAL HEMORRHAGE, HX OF 11/03/2006   Social History   Socioeconomic History  . Marital status: Married    Spouse name: Not on file  . Number of children: Not on file  . Years of education: Not on file  . Highest education level: Not on file  Occupational History  . Not on file  Social Needs  . Financial resource strain: Not on file  . Food insecurity:    Worry: Not on file    Inability: Not on file  . Transportation needs:    Medical: Not on file    Non-medical: Not on file  Tobacco Use  . Smoking status: Never Smoker  . Smokeless tobacco: Never Used  Substance and Sexual Activity  . Alcohol use: No  . Drug use: No  . Sexual activity: Not on file  Lifestyle  . Physical activity:    Days per week: Not on file    Minutes per session: Not on file  . Stress: Not on file  Relationships  . Social  connections:    Talks on phone: Not on file    Gets together: Not on file    Attends religious service: Not on file    Active member of club or organization: Not on file    Attends meetings of clubs or organizations: Not on file    Relationship status: Not on file  . Intimate partner violence:    Fear of current or ex partner: Not on file    Emotionally abused: Not on file    Physically abused: Not on file    Forced sexual activity: Not on file  Other Topics Concern  . Not on file  Social History Narrative   Lived in [place] for 18 years. Moved here for the weather.    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 Had to retire due  to cardiac illness.      Husband retired January 2015. Married 1991 (2nd marriage). No kids.       Hobbies: walks 2 miles per day, read, work outside          Ms. Kroft's family history includes Cancer in her maternal aunt and mother; Diabetes in her maternal grandmother; Heart disease in her maternal aunt, maternal grandmother, and mother.      Objective:    Vitals:   05/04/18 1357  BP: 124/70  Pulse: 92    Physical Exam; well-developed older white female in no acute distress, pleasant blood pressure 124/70 pulse 92, BMI 22.1.  HEENT; nontraumatic normocephalic EOMI PERRLA sclera anicteric oropharynx moist, Cardiovascular; regular rate and rhythm with S1-S2, mechanical valve clicks.  Pulmonary; clear bilaterally, Abdomen; soft, nontender nondistended bowel sounds are present there is no palpable mass or hepatosplenomegaly.  Rectal; exam no external lesions noted, digital exam is negative there is scant stool in the rectal vault brown and Hemoccult negative.  Extremities; no clubbing cyanosis or edema skin warm dry, Neuropsych ;alert and oriented, grossly nonfocal mood and affect appropriate       Assessment & Plan:   #42 67 year old white female with new onset significant microcytic anemia with mixed iron studies showing  normal serum iron and TIBC well of saturation but low ferritin of 12. Patient is asymptomatic at present, had presented with fatigue and dyspnea which improved after a brief increase in her chronic diuretics. I suspect she may have had some demand ischemia/masturbation of CHF due to anemia  Etiology of anemia is not clear, currently heme-negative and has no GI symptoms.  In addition she is chronically anticoagulated with Coumadin and aspirin.. Unclear whether she has had an episode of occult GI blood loss or whether anemia is related to anemia of chronic disease, or other underlying bone marrow dysfunction.  #2 S/p  post mechanical aortic and mitral valve replacements-chronically anticoagulated on Coumadin and aspirin. #3 congestive heart failure- EF 35% 4.  Chronic kidney disease 5.  WPW 6.  Atrial flutter 7.  History of breast cancer 8.  History of restrictive pericarditis status post remote radiation therapy #9 remote history of Hodgkin's lymphoma that is post radiation and splenectomy  Plan; repeat CBC today, check B12 and folate levels and reticulocyte count. Patient will complete Hemoccults and return. We discussed potential need for colonoscopy and EGD.  This would require her coming off of Coumadin and being bridged with Lovenox would be managed through her Coumadin clinic here at [facility].  She is anxious about coming off of anticoagulation and obviously would like to avoid procedures if possible.  She has been started on low-dose oral iron and will continue for now.  Will await above labs and stool studies.  Patient will be established with Dr. Rush Landmark .  Cythina Mickelsen Genia Harold PA-C 05/05/2018   Cc: Marin Olp, MD

## 2018-05-15 ENCOUNTER — Other Ambulatory Visit: Payer: Medicare Other

## 2018-05-15 ENCOUNTER — Ambulatory Visit: Payer: Medicare Other | Admitting: General Practice

## 2018-05-15 ENCOUNTER — Other Ambulatory Visit (INDEPENDENT_AMBULATORY_CARE_PROVIDER_SITE_OTHER): Payer: Medicare Other

## 2018-05-15 DIAGNOSIS — D649 Anemia, unspecified: Secondary | ICD-10-CM | POA: Diagnosis not present

## 2018-05-15 DIAGNOSIS — Z952 Presence of prosthetic heart valve: Secondary | ICD-10-CM

## 2018-05-15 DIAGNOSIS — Z7901 Long term (current) use of anticoagulants: Secondary | ICD-10-CM

## 2018-05-15 LAB — HEMOCCULT SLIDES (X 3 CARDS)
Fecal Occult Blood: POSITIVE — AB
OCCULT 1: NEGATIVE
OCCULT 2: NEGATIVE
OCCULT 3: NEGATIVE
OCCULT 4: NEGATIVE
OCCULT 5: POSITIVE — AB

## 2018-05-15 LAB — POCT INR: INR: 3 (ref 2.0–3.0)

## 2018-05-15 NOTE — Patient Instructions (Addendum)
Pre visit review using our clinic review tool, if applicable. No additional management support is needed unless otherwise documented below in the visit note.  Continue 1 tablet daily . Recheck in 4 weeks.  

## 2018-05-16 ENCOUNTER — Other Ambulatory Visit: Payer: Self-pay | Admitting: Family Medicine

## 2018-06-01 ENCOUNTER — Telehealth: Payer: Self-pay | Admitting: Physician Assistant

## 2018-06-01 DIAGNOSIS — D649 Anemia, unspecified: Secondary | ICD-10-CM

## 2018-06-01 NOTE — Telephone Encounter (Signed)
straining should not cause heme positive stool ... yes we can repeat labs, and hemoccults - please order CBC ,ferritin, and hemoccults ---would like her to come here if she can , orders under my name so I get results - thanks

## 2018-06-01 NOTE — Telephone Encounter (Signed)
Called and spoke with patient-patient is requesting to know if the reason for the positive on the hemoccult stools was "because I strained when I got that sample"? Concerned about having to come off of the Coumadin if further testing is needed; can the patient repeat the hemoccult cards for stool samples? Requesting additional lab work to see if "my numbers have improved";  Can lab work be done at [facility] lab or can it be done through Dr. [name] (PCP) @ [address]

## 2018-06-01 NOTE — Telephone Encounter (Signed)
Called and spoke with patient- patient informed of PA-C's recommendations; patient is agreeable with plan of care; lab work orders placed in Epic at this time; patient advised to come to [place] office to complete lab work ad is also agreeable with this request; Patient verbalized understanding of information/instructions; Patient was advised to call back if questions/concerns arise;

## 2018-06-01 NOTE — Telephone Encounter (Signed)
Pt is requesting to speak with Amy, she states that she has questions regarding lab results and stool test. If she does not answer in phone number on file, pls try her cell [phone].

## 2018-06-03 ENCOUNTER — Other Ambulatory Visit (INDEPENDENT_AMBULATORY_CARE_PROVIDER_SITE_OTHER): Payer: Medicare Other

## 2018-06-03 DIAGNOSIS — D649 Anemia, unspecified: Secondary | ICD-10-CM

## 2018-06-03 LAB — CBC WITH DIFFERENTIAL/PLATELET
Basophils Absolute: 0.2 10*3/uL — ABNORMAL HIGH (ref 0.0–0.1)
Basophils Relative: 1.4 % (ref 0.0–3.0)
Eosinophils Absolute: 0.4 10*3/uL (ref 0.0–0.7)
Eosinophils Relative: 3.7 % (ref 0.0–5.0)
HCT: 35.6 % — ABNORMAL LOW (ref 36.0–46.0)
Hemoglobin: 11.1 g/dL — ABNORMAL LOW (ref 12.0–15.0)
Lymphocytes Relative: 12.9 % (ref 12.0–46.0)
Lymphs Abs: 1.4 10*3/uL (ref 0.7–4.0)
MCHC: 31.2 g/dL (ref 30.0–36.0)
MCV: 82.2 fl (ref 78.0–100.0)
Monocytes Absolute: 1.4 10*3/uL — ABNORMAL HIGH (ref 0.1–1.0)
Monocytes Relative: 13.2 % — ABNORMAL HIGH (ref 3.0–12.0)
Neutro Abs: 7.5 10*3/uL (ref 1.4–7.7)
Neutrophils Relative %: 68.8 % (ref 43.0–77.0)
Platelets: 466 10*3/uL — ABNORMAL HIGH (ref 150.0–400.0)
RBC: 4.33 Mil/uL (ref 3.87–5.11)
RDW: 23.7 % — ABNORMAL HIGH (ref 11.5–15.5)
WBC: 10.9 10*3/uL — ABNORMAL HIGH (ref 4.0–10.5)

## 2018-06-03 LAB — FERRITIN: Ferritin: 24.8 ng/mL (ref 10.0–291.0)

## 2018-06-05 ENCOUNTER — Other Ambulatory Visit: Payer: Self-pay | Admitting: *Deleted

## 2018-06-05 ENCOUNTER — Other Ambulatory Visit (INDEPENDENT_AMBULATORY_CARE_PROVIDER_SITE_OTHER): Payer: Medicare Other

## 2018-06-05 ENCOUNTER — Other Ambulatory Visit: Payer: Medicare Other

## 2018-06-05 DIAGNOSIS — D649 Anemia, unspecified: Secondary | ICD-10-CM

## 2018-06-05 LAB — FECAL OCCULT BLOOD, IMMUNOCHEMICAL: Fecal Occult Bld: NEGATIVE

## 2018-06-09 ENCOUNTER — Other Ambulatory Visit: Payer: Self-pay

## 2018-06-09 DIAGNOSIS — D649 Anemia, unspecified: Secondary | ICD-10-CM

## 2018-06-12 ENCOUNTER — Ambulatory Visit: Payer: Medicare Other | Admitting: General Practice

## 2018-06-12 DIAGNOSIS — Z952 Presence of prosthetic heart valve: Secondary | ICD-10-CM

## 2018-06-12 DIAGNOSIS — Z7901 Long term (current) use of anticoagulants: Secondary | ICD-10-CM

## 2018-06-12 LAB — POCT INR: INR: 2.3 (ref 2.0–3.0)

## 2018-06-12 NOTE — Patient Instructions (Signed)
Pre visit review using our clinic review tool, if applicable. No additional management support is needed unless otherwise documented below in the visit note.  Take 1 1/2 tablet today and then change dosage to 1 tablet daily except 1 1/2 tablets every Friday.  Re-check in 4 weeks.

## 2018-06-23 ENCOUNTER — Ambulatory Visit (INDEPENDENT_AMBULATORY_CARE_PROVIDER_SITE_OTHER): Payer: Medicare Other

## 2018-06-23 ENCOUNTER — Ambulatory Visit: Payer: Medicare Other | Admitting: Physician Assistant

## 2018-06-23 ENCOUNTER — Encounter: Payer: Self-pay | Admitting: Physician Assistant

## 2018-06-23 ENCOUNTER — Ambulatory Visit: Payer: Self-pay

## 2018-06-23 VITALS — BP 140/90 | HR 81 | Temp 97.5°F | Ht 66.0 in | Wt 143.4 lb

## 2018-06-23 DIAGNOSIS — R059 Cough, unspecified: Secondary | ICD-10-CM

## 2018-06-23 DIAGNOSIS — R05 Cough: Secondary | ICD-10-CM | POA: Diagnosis not present

## 2018-06-23 DIAGNOSIS — D649 Anemia, unspecified: Secondary | ICD-10-CM | POA: Diagnosis not present

## 2018-06-23 LAB — CBC WITH DIFFERENTIAL/PLATELET
Basophils Absolute: 0.2 10*3/uL — ABNORMAL HIGH (ref 0.0–0.1)
Basophils Relative: 1.4 % (ref 0.0–3.0)
Eosinophils Absolute: 0.3 10*3/uL (ref 0.0–0.7)
Eosinophils Relative: 2.2 % (ref 0.0–5.0)
HCT: 33.3 % — ABNORMAL LOW (ref 36.0–46.0)
Hemoglobin: 10.5 g/dL — ABNORMAL LOW (ref 12.0–15.0)
Lymphocytes Relative: 10.2 % — ABNORMAL LOW (ref 12.0–46.0)
Lymphs Abs: 1.6 10*3/uL (ref 0.7–4.0)
MCHC: 31.5 g/dL (ref 30.0–36.0)
MCV: 82.9 fl (ref 78.0–100.0)
Monocytes Absolute: 1.9 10*3/uL — ABNORMAL HIGH (ref 0.1–1.0)
Monocytes Relative: 12.3 % — ABNORMAL HIGH (ref 3.0–12.0)
Neutro Abs: 11.3 10*3/uL — ABNORMAL HIGH (ref 1.4–7.7)
Neutrophils Relative %: 73.9 % (ref 43.0–77.0)
Platelets: 453 10*3/uL — ABNORMAL HIGH (ref 150.0–400.0)
RBC: 4.02 Mil/uL (ref 3.87–5.11)
RDW: 23.4 % — ABNORMAL HIGH (ref 11.5–15.5)
WBC: 15.3 10*3/uL — ABNORMAL HIGH (ref 4.0–10.5)

## 2018-06-23 LAB — POCT HEMOGLOBIN: Hemoglobin: 10.4 g/dL — AB (ref 11–14.6)

## 2018-06-23 MED ORDER — BENZONATATE 100 MG PO CAPS: 100.0000 mg | ORAL_CAPSULE | Freq: Three times a day (TID) | ORAL | 1 refills | Status: DC | PRN

## 2018-06-23 MED ORDER — DOXYCYCLINE HYCLATE 100 MG PO TABS: 100.0000 mg | ORAL_TABLET | Freq: Two times a day (BID) | ORAL | 0 refills | Status: DC

## 2018-06-23 NOTE — Telephone Encounter (Signed)
See note

## 2018-06-23 NOTE — Telephone Encounter (Signed)
Pt. Reports she started coughing 8-9 days ago. Productive with greenish-gray,thick mucus. Has been Coricidin with little relief.. Having wheezing and shortness of breath. No fever."Just can't get over this."  Reason for Disposition . [1] Continuous (nonstop) coughing interferes with work or school AND [2] no improvement using cough treatment per Care Advice  Answer Assessment - Initial Assessment Questions 1. ONSET: "When did the cough begin?"      Started 8-9 days ago 2. SEVERITY: "How bad is the cough today?"      Severe 3. RESPIRATORY DISTRESS: "Describe your breathing."      Some shortness of breath 4. FEVER: "Do you have a fever?" If so, ask: "What is your temperature, how was it measured, and when did it start?"     No 5. SPUTUM: "Describe the color of your sputum" (clear, white, yellow, green)     Greenish-gray 6. HEMOPTYSIS: "Are you coughing up any blood?" If so ask: "How much?" (flecks, streaks, tablespoons, etc.)     No 7. CARDIAC HISTORY: "Do you have any history of heart disease?" (e.g., heart attack, congestive heart failure)      Yes 8. LUNG HISTORY: "Do you have any history of lung disease?"  (e.g., pulmonary embolus, asthma, emphysema)     Yes 9. PE RISK FACTORS: "Do you have a history of blood clots?" (or: recent major surgery, recent prolonged travel, bedridden)     No 10. OTHER SYMPTOMS: "Do you have any other symptoms?" (e.g., runny nose, wheezing, chest pain)       Wheezing 11. PREGNANCY: "Is there any chance you are pregnant?" "When was your last menstrual period?"       No 12. TRAVEL: "Have you traveled out of the country in the last month?" (e.g., travel history, exposures)       No  Protocols used: [place]

## 2018-06-23 NOTE — Patient Instructions (Addendum)
It was great to see you!  Please schedule your next INR check sooner since we are starting an antibiotic.  Use medication as prescribed: doxycycline and tessalon perles  Push fluids and get plenty of rest. Please return if you are not improving as expected, or if you have high fevers (>101.5) or difficulty swallowing or worsening productive cough.  Call clinic with questions.  I hope you start feeling better soon!

## 2018-06-23 NOTE — Progress Notes (Signed)
Grace [name] is a 68 y.o. female here for a new problem.  I acted as a Education administrator for [employer], PA-C Anselmo Pickler, LPN  History of Present Illness:   Chief Complaint  Patient presents with  . Cough    Cough  Chronicity: Started 8-9 days ago. The problem has been gradually worsening. The problem occurs every few hours. The cough is productive of sputum (expectorating green sputum). Associated symptoms include chills, nasal congestion, postnasal drip and shortness of breath. Pertinent negatives include no ear congestion, ear pain, fever, headaches, sore throat or wheezing. Associated symptoms comments: Chest congestion. The symptoms are aggravated by lying down. Treatments tried: Coricidine HBP cough & cold, Tessalon capsules. The treatment provided mild relief. Her past medical history is significant for bronchitis. There is no history of asthma or pneumonia.   She has been working with GI regarding her anemia. Prior visit with PCP for SOB revealed critical Hgb. Last Hgb two weeks ago was 11.1. Denies lightheadedness or dizziness.  Past Medical History:  Diagnosis Date  . Anemia   . Aortic insufficiency   . AORTIC VALVE REPLACEMENT, HX OF 04/27/2007   Qualifier: Diagnosis of  By: Leanne Chang MD, Bruce    . Cancer (HCC)    breast  . CHF (congestive heart failure) ([place])   . Congestive heart failure ([place]) 08/03/2009   [facility]. [facility] Half. Last echo 2010 with EF 30%. Restrictive due to radiation. AVR MVR. 02/2014 visit planned.    . GI bleed   . Hodgkin's disease   . Hyperlipidemia   . Hypothyroidism   . Migraine   . MITRAL VALVE REPLACEMENT, HX OF 04/27/2007   Qualifier: Diagnosis of  By: Leanne Chang MD, Bruce    . WPW (Wolff-Parkinson-White syndrome)      Social History   Socioeconomic History  . Marital status: Married    Spouse name: Not on file  . Number of children: Not on file  . Years of education: Not on file  . Highest education level: Not on file  Occupational History  .  Not on file  Social Needs  . Financial resource strain: Not on file  . Food insecurity:    Worry: Not on file    Inability: Not on file  . Transportation needs:    Medical: Not on file    Non-medical: Not on file  Tobacco Use  . Smoking status: Never Smoker  . Smokeless tobacco: Never Used  Substance and Sexual Activity  . Alcohol use: No  . Drug use: No  . Sexual activity: Not on file  Lifestyle  . Physical activity:    Days per week: Not on file    Minutes per session: Not on file  . Stress: Not on file  Relationships  . Social connections:    Talks on phone: Not on file    Gets together: Not on file    Attends religious service: Not on file    Active member of club or organization: Not on file    Attends meetings of clubs or organizations: Not on file    Relationship status: Not on file  . Intimate partner violence:    Fear of current or ex partner: Not on file    Emotionally abused: Not on file    Physically abused: Not on file    Forced sexual activity: Not on file  Other Topics Concern  . Not on file  Social History Narrative   Lived in [place] for 18 years. Moved here  for the weather.    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      Husband retired January 2015. Married 1991 (2nd marriage). No kids.       Hobbies: walks 2 miles per day, read, work outside          Past Surgical History:  Procedure Laterality Date  . AORTIC VALVE SURGERY    . bilateral breast implants    . ENDOMETRIAL ABLATION    . MASTECTOMY    . MITRAL VALVE REPLACEMENT    . pericardial stripping  9/08  . SPLENECTOMY    . THORACOTOMY    . TONSILLECTOMY    . valvulopathy  06/13/06    Family History  Problem Relation Age of Onset  . Heart disease Mother   . Cancer Mother        breast  . Heart disease Maternal Aunt   . Cancer Maternal Aunt        uterine  . Heart disease Maternal Grandmother   . Diabetes Maternal  Grandmother   . Hypercalcemia Neg Hx   . Colon cancer Neg Hx   . Esophageal cancer Neg Hx     Allergies  Allergen Reactions  . Digoxin Other (See Comments)  . Statins     myalgia    Current Medications:   Current Outpatient Medications:  .  aspirin 81 MG tablet, Take 81 mg by mouth daily.  , Disp: , Rfl:  .  aspirin-acetaminophen-caffeine (EXCEDRIN MIGRAINE) 250-250-65 MG tablet, Take 2 tablets by mouth every 6 (six) hours as needed for headache., Disp: , Rfl:  .  diphenhydramine-acetaminophen (TYLENOL PM) 25-500 MG TABS tablet, Take 1 tablet by mouth at bedtime as needed., Disp: , Rfl:  .  docusate sodium (COLACE) 100 MG capsule, Take 100 mg by mouth daily. , Disp: , Rfl:  .  ezetimibe (ZETIA) 10 MG tablet, Take 10 mg by mouth daily. , Disp: , Rfl:  .  FERROUS SULFATE PO, Take 65 mg by mouth every other day., Disp: , Rfl:  .  furosemide (LASIX) 40 MG tablet, TAKE 1 TABLET BY MOUTH  DAILY, Disp: 90 tablet, Rfl: 1 .  ibandronate (BONIVA) 150 MG tablet, Take 1 tablet (150 mg total) by mouth every 30 (thirty) days. In AM w. full glass of h20 and do not take anything else by mouth for 30 mins, Disp: 3 tablet, Rfl: 3 .  levothyroxine (SYNTHROID, LEVOTHROID) 112 MCG tablet, TAKE 1 TABLET BY MOUTH  DAILY, Disp: 90 tablet, Rfl: 3 .  Magnesium 250 MG TABS, Take 1 tablet by mouth daily., Disp: , Rfl:  .  metoprolol (TOPROL-XL) 50 MG 24 hr tablet, Take 50 mg by mouth 2 (two) times daily. , Disp: , Rfl:  .  Multiple Vitamin (MULTI-VITAMIN DAILY) TABS, Take by mouth daily., Disp: , Rfl:  .  potassium chloride SA (K-DUR,KLOR-CON) 20 MEQ tablet, TAKE 1 TABLET BY MOUTH  DAILY, Disp: 90 tablet, Rfl: 1 .  spironolactone (ALDACTONE) 25 MG tablet, Take 25 mg by mouth 2 (two) times daily. , Disp: , Rfl:  .  warfarin (COUMADIN) 5 MG tablet, Take 1 tablet daily or AS DIRECTED BY ANTICOAGULATION CLINIC 90 DAY, Disp: 95 tablet, Rfl: 1 .  benzonatate (TESSALON) 100 MG capsule, Take 1 capsule (100 mg total) by  mouth 3 (three) times daily as needed for cough., Disp: 20 capsule, Rfl: 1 .  doxycycline (VIBRA-TABS) 100 MG tablet, Take 1 tablet (  100 mg total) by mouth 2 (two) times daily., Disp: 20 tablet, Rfl: 0   Review of Systems:   Review of Systems  Constitutional: Positive for chills. Negative for fever.  HENT: Positive for postnasal drip. Negative for ear pain and sore throat.   Respiratory: Positive for cough and shortness of breath. Negative for wheezing.   Neurological: Negative for headaches.    Vitals:   Vitals:   06/23/18 1040  BP: 140/90  Pulse: 81  Temp: (!) 97.5 F (36.4 C)  TempSrc: Oral  SpO2: 99%  Weight: 143 lb 6.1 oz (65 kg)  Height: 5\' 6"  (1.676 m)     Body mass index is 23.14 kg/m.  Physical Exam:   Physical Exam Vitals signs and nursing note reviewed.  Constitutional:      General: She is not in acute distress.    Appearance: She is well-developed. She is not ill-appearing or toxic-appearing.  HENT:     Head: Normocephalic and atraumatic.     Right Ear: Tympanic membrane, ear canal and external ear normal. Tympanic membrane is not erythematous, retracted or bulging.     Left Ear: Tympanic membrane, ear canal and external ear normal. Tympanic membrane is not erythematous, retracted or bulging.     Nose: Nose normal.     Right Sinus: No maxillary sinus tenderness or frontal sinus tenderness.     Left Sinus: No maxillary sinus tenderness or frontal sinus tenderness.     Mouth/Throat:     Pharynx: Uvula midline. No posterior oropharyngeal erythema.  Eyes:     General: Lids are normal.     Conjunctiva/sclera: Conjunctivae normal.  Neck:     Trachea: Trachea normal.  Cardiovascular:     Rate and Rhythm: Normal rate and regular rhythm.     Heart sounds: S1 normal and S2 normal. Murmur (loud mechanical) present.  Pulmonary:     Effort: Pulmonary effort is normal.     Breath sounds: Normal breath sounds. No decreased breath sounds, wheezing, rhonchi or rales.   Lymphadenopathy:     Cervical: No cervical adenopathy.  Skin:    General: Skin is warm and dry.  Neurological:     Mental Status: She is alert.  Psychiatric:        Speech: Speech normal.        Behavior: Behavior normal. Behavior is cooperative.   Chest xray without significant acute issues on my read  Results for orders placed or performed in visit on 06/23/18  CBC with Differential/Platelet  Result Value Ref Range   WBC 15.3 (H) 4.0 - 10.5 K/uL   RBC 4.02 3.87 - 5.11 Mil/uL   Hemoglobin 10.5 (L) 12.0 - 15.0 g/dL   HCT 33.3 (L) 36.0 - 46.0 %   MCV 82.9 78.0 - 100.0 fl   MCHC 31.5 30.0 - 36.0 g/dL   RDW 23.4 (H) 11.5 - 15.5 %   Platelets 453.0 (H) 150.0 - 400.0 K/uL   Neutrophils Relative % 73.9 43.0 - 77.0 %   Lymphocytes Relative 10.2 (L) 12.0 - 46.0 %   Monocytes Relative 12.3 (H) 3.0 - 12.0 %   Eosinophils Relative 2.2 0.0 - 5.0 %   Basophils Relative 1.4 0.0 - 3.0 %   Neutro Abs 11.3 (H) 1.4 - 7.7 K/uL   Lymphs Abs 1.6 0.7 - 4.0 K/uL   Monocytes Absolute 1.9 (H) 0.1 - 1.0 K/uL   Eosinophils Absolute 0.3 0.0 - 0.7 K/uL   Basophils Absolute 0.2 (H) 0.0 - 0.1 K/uL  POCT hemoglobin  Result Value Ref Range   Hemoglobin 10.4 (A) 11 - 14.6 g/dL    Assessment and Plan:   Grace [name] was seen today for cough.  Diagnoses and all orders for this visit:  Cough No red flags on exam. Official CXR read pending. Given multiple comorbidities, elevated WBC and symptoms not improving with supportive care, will initiate doxcycline per orders. Advised her to go to sooner for INR check due to this. Discussed taking medications as prescribed. Reviewed return precautions including worsening fever, SOB, worsening cough or other concerns. Push fluids and rest. I recommend that patient follow-up if symptoms worsen or persist despite treatment x 7-10 days, sooner if needed. -     DG Chest 2 View; Future  Anemia, unspecified type Decrease from 2 weeks ago. I have fwd'd to Nicoletta Ba,  PA-C with [facility] for further review and advice.  -     POCT hemoglobin -     CBC with Differential/Platelet  Other orders -     doxycycline (VIBRA-TABS) 100 MG tablet; Take 1 tablet (100 mg total) by mouth 2 (two) times daily. -     benzonatate (TESSALON) 100 MG capsule; Take 1 capsule (100 mg total) by mouth 3 (three) times daily as needed for cough.  . Reviewed expectations re: course of current medical issues. . Discussed self-management of symptoms. . Outlined signs and symptoms indicating need for more acute intervention. . Patient verbalized understanding and all questions were answered. . See orders for this visit as documented in the electronic medical record. . Patient received an After-Visit Summary.  CMA or LPN served as scribe during this visit. History, Physical, and Plan performed by medical provider. The above documentation has been reviewed and is accurate and complete.   Inda Coke, PA-C

## 2018-06-25 ENCOUNTER — Other Ambulatory Visit: Payer: Self-pay

## 2018-06-25 DIAGNOSIS — D649 Anemia, unspecified: Secondary | ICD-10-CM

## 2018-06-30 ENCOUNTER — Encounter: Payer: Self-pay | Admitting: Family Medicine

## 2018-06-30 ENCOUNTER — Ambulatory Visit: Payer: Medicare Other | Admitting: Family Medicine

## 2018-06-30 VITALS — BP 110/60 | HR 90 | Temp 97.9°F | Ht 66.0 in | Wt 137.0 lb

## 2018-06-30 DIAGNOSIS — E785 Hyperlipidemia, unspecified: Secondary | ICD-10-CM | POA: Diagnosis not present

## 2018-06-30 DIAGNOSIS — Z7901 Long term (current) use of anticoagulants: Secondary | ICD-10-CM

## 2018-06-30 DIAGNOSIS — S300XXA Contusion of lower back and pelvis, initial encounter: Secondary | ICD-10-CM | POA: Diagnosis not present

## 2018-06-30 LAB — CBC
HCT: 33.2 % — ABNORMAL LOW (ref 36.0–46.0)
Hemoglobin: 10.6 g/dL — ABNORMAL LOW (ref 12.0–15.0)
MCHC: 31.9 g/dL (ref 30.0–36.0)
MCV: 85.4 fl (ref 78.0–100.0)
Platelets: 569 10*3/uL — ABNORMAL HIGH (ref 150.0–400.0)
RBC: 3.89 Mil/uL (ref 3.87–5.11)
RDW: 23.4 % — ABNORMAL HIGH (ref 11.5–15.5)
WBC: 12.8 10*3/uL — ABNORMAL HIGH (ref 4.0–10.5)

## 2018-06-30 LAB — COMPREHENSIVE METABOLIC PANEL
ALT: 23 U/L (ref 0–35)
AST: 35 U/L (ref 0–37)
Albumin: 4.4 g/dL (ref 3.5–5.2)
Alkaline Phosphatase: 78 U/L (ref 39–117)
BUN: 36 mg/dL — ABNORMAL HIGH (ref 6–23)
CO2: 29 mEq/L (ref 19–32)
Calcium: 10.5 mg/dL (ref 8.4–10.5)
Chloride: 98 mEq/L (ref 96–112)
Creatinine, Ser: 1.47 mg/dL — ABNORMAL HIGH (ref 0.40–1.20)
GFR: 35.35 mL/min — ABNORMAL LOW (ref >60.00)
Glucose, Bld: 93 mg/dL (ref 70–99)
Potassium: 3.7 mEq/L (ref 3.5–5.1)
Sodium: 137 mEq/L (ref 135–145)
Total Bilirubin: 0.4 mg/dL (ref 0.2–1.2)
Total Protein: 8 g/dL (ref 6.0–8.3)

## 2018-06-30 LAB — PROTIME-INR
INR: 3.4 ratio — ABNORMAL HIGH (ref 0.8–1.0)
Prothrombin Time: 39.1 s — ABNORMAL HIGH (ref 9.6–13.1)

## 2018-06-30 NOTE — Patient Instructions (Addendum)
Hang in there!  Despite all that you are facing and have faced-I can see how strong you are  You have done a great job with managing current issue-I do think you had a blood vessel burst and then formation of a hematoma-the hematoma itself appears about the size of a golf ball-you have had bleeding around that and thus the associated bruising  Lets check an INR and CBC today as well as kidney and liver check  Continue ice or heat up to 3-4 x a day- whichever brings more comfort. This will probably take weeks (and more likely months to heal) Let me know if you have new or worsening symptoms

## 2018-06-30 NOTE — Progress Notes (Signed)
Phone [phone]   Subjective:  Grace [name] is a 68 y.o. year old very pleasant female patient who presents for/with See problem oriented charting ROS-patient has some fatigue.  She complains of pain over her left buttocks as well as extensive bruising.  No recent rectal bleeding reported.  Past Medical History-  Patient Active Problem List   Diagnosis Date Noted  . Atrial flutter ([place]) 12/20/2016    Priority: High  . Osteoporosis. check vitamin D yearly. 01/17/2014    Priority: High  . H/O aortic valve replacement and mitral valve replacement. 01/07/2013    Priority: High  . WOLFF (WOLFE)-PARKINSON-WHITE (WPW) SYNDROME 08/16/2009    Priority: High  . Congestive heart failure ([place]) 08/03/2009    Priority: High  . TRANSIENT ISCHEMIC ATTACKS, HX OF 04/27/2007    Priority: High  . History of Hodgkin's disease 11/03/2006    Priority: High  . Hypertension 12/20/2016    Priority: Medium  . CKD (chronic kidney disease), stage III ([place]) 01/18/2014    Priority: Medium  . Iron deficiency anemia 08/16/2009    Priority: Medium  . Hyperlipidemia 08/29/2008    Priority: Medium  . Hypothyroidism 11/03/2006    Priority: Medium  . COMMON MIGRAINE 11/03/2006    Priority: Medium  . BREAST CANCER, HX OF 11/03/2006    Priority: Medium  . Long term (current) use of anticoagulants 12/23/2012    Priority: Low  . GASTROINTESTINAL HEMORRHAGE, HX OF 11/03/2006    Priority: Low  . Hypercalcemia 12/23/2017    Medications- reviewed and updated Current Outpatient Medications  Medication Sig Dispense Refill  . aspirin 81 MG tablet Take 81 mg by mouth daily.      Marland Kitchen aspirin-acetaminophen-caffeine (EXCEDRIN MIGRAINE) 250-250-65 MG tablet Take 2 tablets by mouth every 6 (six) hours as needed for headache.    . benzonatate (TESSALON) 100 MG capsule Take 1 capsule (100 mg total) by mouth 3 (three) times daily as needed for cough. 20 capsule 1  . diphenhydramine-acetaminophen (TYLENOL PM) 25-500  MG TABS tablet Take 1 tablet by mouth at bedtime as needed.    . docusate sodium (COLACE) 100 MG capsule Take 100 mg by mouth daily.     Marland Kitchen doxycycline (VIBRA-TABS) 100 MG tablet Take 1 tablet (100 mg total) by mouth 2 (two) times daily. 20 tablet 0  . ezetimibe (ZETIA) 10 MG tablet Take 10 mg by mouth daily.     Marland Kitchen FERROUS SULFATE PO Take 65 mg by mouth every other day.    . furosemide (LASIX) 40 MG tablet TAKE 1 TABLET BY MOUTH  DAILY 90 tablet 1  . ibandronate (BONIVA) 150 MG tablet Take 1 tablet (150 mg total) by mouth every 30 (thirty) days. In AM w. full glass of h20 and do not take anything else by mouth for 30 mins 3 tablet 3  . levothyroxine (SYNTHROID, LEVOTHROID) 112 MCG tablet TAKE 1 TABLET BY MOUTH  DAILY 90 tablet 3  . Magnesium 250 MG TABS Take 1 tablet by mouth daily.    . metoprolol (TOPROL-XL) 50 MG 24 hr tablet Take 50 mg by mouth 2 (two) times daily.     . Multiple Vitamin (MULTI-VITAMIN DAILY) TABS Take by mouth daily.    . potassium chloride SA (K-DUR,KLOR-CON) 20 MEQ tablet TAKE 1 TABLET BY MOUTH  DAILY 90 tablet 1  . spironolactone (ALDACTONE) 25 MG tablet Take 25 mg by mouth 2 (two) times daily.     Marland Kitchen warfarin (COUMADIN) 5 MG tablet Take 1 tablet  daily or AS DIRECTED BY ANTICOAGULATION CLINIC 90 DAY 95 tablet 1   No current facility-administered medications for this visit.      Objective:  BP 110/60 (BP Location: Right Arm, Patient Position: Sitting, Cuff Size: Large)   Pulse 90   Temp 97.9 F (36.6 C) (Oral)   Ht 5\' 6"  (1.676 m)   Wt 137 lb (62.1 kg)   SpO2 97%   BMI 22.11 kg/m  Gen: NAD, resting comfortably CV: RRR no murmurs rubs or gallops Lungs: CTAB no crackles, wheeze, rhonchi Abdomen: soft/nontender/nondistended/normal bowel sounds. No rebound or guarding.  Ext: no edema Skin: warm, dry Neuro: grossly normal, moves all extremities MSK: Extensive bruising in bandlike appearance noted-20 x5 cm in some areas by 3 centimeter toward spine. In middle golf  ball size subcutaneous somewhat mobile area nontender-suspected hematoma    Assessment and Plan   Traumatic hematoma of buttock, initial encounter S: seen last week for a cough with Inda Coke- was placed on doxycycline and tessalon for cough. Noted chest congestion. Had been going on for a few weeks despite coricidin HBP. Tessalon didn't help.   On Thursday morning- With one coughing fit had a big coughing fit and hurt in left upper back all the way down to left lower back. Also noted some pins/needles in both legs. Had another coughing fit Thursday evening and felt like a bite on left lateral hip. On left low back noted a bulge of her tissue- was about the size of a walnut to start and then got spasm over the left low back and then later looked like she had been hit with a "fying pain" closer to the spine.  A/P: Traumatic hematoma likely due to severe coughing fit.  Hematoma is small but has extensive bruising-this has been stable and is not worsening over recent days.  We discussed using ice packs or heat as needed for comfort.  We discussed this may take an extended period of time to heal-up to 4 to 6 weeks  Future Appointments  Date Time Provider [place]  07/10/2018  8:15 AM [facility]  07/16/2018  9:00 AM Mansouraty, Telford Nab., MD [facility]  10/08/2018  9:00 AM [facility] CO[facility]   Lab/Order associations: Traumatic hematoma of buttock, initial encounter  Long term (current) use of anticoagulants - Plan: Protime-INR, CANCELED: Protime-INR ( [facility] ONLY)  Hyperlipidemia, unspecified hyperlipidemia type - Plan: CBC, Comprehensive metabolic panel  Return precautions advised.  Garret Reddish, MD

## 2018-07-10 ENCOUNTER — Other Ambulatory Visit (INDEPENDENT_AMBULATORY_CARE_PROVIDER_SITE_OTHER): Payer: Medicare Other

## 2018-07-10 ENCOUNTER — Ambulatory Visit: Payer: Medicare Other | Admitting: General Practice

## 2018-07-10 DIAGNOSIS — D649 Anemia, unspecified: Secondary | ICD-10-CM | POA: Diagnosis not present

## 2018-07-10 DIAGNOSIS — Z952 Presence of prosthetic heart valve: Secondary | ICD-10-CM

## 2018-07-10 DIAGNOSIS — Z7901 Long term (current) use of anticoagulants: Secondary | ICD-10-CM

## 2018-07-10 LAB — CBC WITH DIFFERENTIAL/PLATELET
Basophils Absolute: 0.1 10*3/uL (ref 0.0–0.1)
Basophils Relative: 1.2 % (ref 0.0–3.0)
Eosinophils Absolute: 0.4 10*3/uL (ref 0.0–0.7)
Eosinophils Relative: 3.9 % (ref 0.0–5.0)
HCT: 32.3 % — ABNORMAL LOW (ref 36.0–46.0)
Hemoglobin: 10.4 g/dL — ABNORMAL LOW (ref 12.0–15.0)
Lymphocytes Relative: 8.2 % — ABNORMAL LOW (ref 12.0–46.0)
Lymphs Abs: 0.8 10*3/uL (ref 0.7–4.0)
MCHC: 32.3 g/dL (ref 30.0–36.0)
MCV: 86.8 fl (ref 78.0–100.0)
Monocytes Absolute: 1.3 10*3/uL — ABNORMAL HIGH (ref 0.1–1.0)
Monocytes Relative: 14.1 % — ABNORMAL HIGH (ref 3.0–12.0)
Neutro Abs: 6.9 10*3/uL (ref 1.4–7.7)
Neutrophils Relative %: 72.6 % (ref 43.0–77.0)
Platelets: 481 10*3/uL — ABNORMAL HIGH (ref 150.0–400.0)
RBC: 3.72 Mil/uL — ABNORMAL LOW (ref 3.87–5.11)
RDW: 23.2 % — ABNORMAL HIGH (ref 11.5–15.5)
WBC: 9.5 10*3/uL (ref 4.0–10.5)

## 2018-07-10 LAB — POCT INR: INR: 4.1 — AB (ref 2.0–3.0)

## 2018-07-10 NOTE — Patient Instructions (Signed)
Pre visit review using our clinic review tool, if applicable. No additional management support is needed unless otherwise documented below in the visit note.  Take 1 1/2 tablet today and then change dosage to 1 tablet daily except 1 1/2 tablets every Friday.  Re-check in 3 weeks.

## 2018-07-15 NOTE — Addendum Note (Signed)
Addended by: Virgina Evener A on: 07/15/2018 04:38 PM   Modules accepted: Orders

## 2018-07-16 ENCOUNTER — Encounter: Payer: Self-pay | Admitting: Gastroenterology

## 2018-07-16 ENCOUNTER — Ambulatory Visit: Payer: Medicare Other | Admitting: Gastroenterology

## 2018-07-16 VITALS — BP 126/80 | HR 70 | Ht 66.0 in | Wt 140.4 lb

## 2018-07-16 DIAGNOSIS — Z9229 Personal history of other drug therapy: Secondary | ICD-10-CM | POA: Diagnosis not present

## 2018-07-16 DIAGNOSIS — D509 Iron deficiency anemia, unspecified: Secondary | ICD-10-CM

## 2018-07-16 NOTE — Patient Instructions (Signed)
If you are age 68 or older, your body mass index should be between 23-30. Your Body mass index is 22.66 kg/m. If this is out of the aforementioned range listed, please consider follow up with your Primary Care Provider.  If you are age 31 or younger, your body mass index should be between 19-25. Your Body mass index is 22.66 kg/m. If this is out of the aformentioned range listed, please consider follow up with your Primary Care Provider.    We will send a clearance form to  Dr. Alycia Rossetti to see if he agrees with you have endoscopies. We will contact you once we have received form back from him.   Start Iron supplements once daily.   Your provider has requested that you go to the basement level for lab work in 1 month. Press "B" on the elevator. The lab is located at the first door on the left as you exit the elevator.  Thank you for choosing me and [facility].  Dr. Rush Landmark

## 2018-07-16 NOTE — Progress Notes (Signed)
[place] VISIT   Primary Care Provider Marin Olp, [place] [address] [phone]  Referring Provider Marin Olp, [address] [place], [place] [address] [phone]  Patient Profile: Grace [name] is a 68 y.o. 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  The patient presents to the [facility] for an evaluation and management of problem(s) noted below:  Problem List 1. Iron deficiency anemia, unspecified iron deficiency anemia type   2. Hx of long-term (current) use of anticoagulants     History of Present Illness: Please see initial consultation note from [place] [facility] for full details of HPI.  In brief, the patient was referred because of anemia with microcytosis and issues of increasing fatigue and shortness of breath.  She was found to have a hemoglobin of 7.5 and a hematocrit of 24 with an MCV of 77.  Prior to December her hemoglobin was 11.6.  Her iron indices were suggestive of iron deficiency.  Thinking that her issues were due to heart failure even though she had anemia medications were adjusted from a diuretic perspective and she improved.  She is on chronic Coumadin as result of both a mechanical aortic and mechanical mitral valve.  She had no history of any overt GI bleeding.  The patient had recent repeat fecal occult cards performed and these returned negative.  Her hemogram has been stable to slightly improved.  As I see her today the patient feels well without any significant GI symptoms.  She feels strong.  She does continue to have worries about coming off anticoagulation due to her valve history.  When she recently saw the anticoagulation clinic they suggest that she may need Lovenox if she were to need to be bridged.  She has not  spoken with her cardiologist back at [facility] as of yet to let them know about the possibility of needing procedures and would appreciate their input as well.  No melena or healthy-appearing abdominal pain.  She recently also had a hematoma develop after significant coughing and was seen by her primary care provider for this.  GI Review of Systems Positive as above Negative for dysphagia, odynophagia, bloating, change in bowel habits  Review of Systems General: Denies fevers/chills/weight loss HEENT: Denies oral lesions Cardiovascular: Denies chest pain/palpitations Pulmonary: Denies shortness of breath Gastroenterological: See HPI Genitourinary: Denies darkened urine or hematuria Hematological: Positive for easy bruising/bleeding due to Coumadin Dermatological: Denies jaundice Psychological: Mood is stable but expresses some anxiety about the consideration of procedures   Medications Current Outpatient Medications  Medication Sig Dispense Refill  . aspirin 81 MG tablet Take 81 mg by mouth daily.      Marland Kitchen aspirin-acetaminophen-caffeine (EXCEDRIN MIGRAINE) 250-250-65 MG tablet Take 2 tablets by mouth every 6 (six) hours as needed for headache.    . benzonatate (TESSALON) 100 MG capsule Take 1 capsule (100 mg total) by mouth 3 (three) times daily as needed for cough. 20 capsule 1  . diphenhydramine-acetaminophen (TYLENOL PM) 25-500 MG TABS tablet Take 1 tablet by mouth at bedtime as needed.    . docusate sodium (COLACE) 100 MG capsule Take 100 mg by mouth daily.     Marland Kitchen ezetimibe (ZETIA) 10 MG tablet Take 10 mg by mouth daily.     Marland Kitchen FERROUS SULFATE PO Take 325 mg by mouth every other day.     Marland Kitchen  furosemide (LASIX) 40 MG tablet TAKE 1 TABLET BY MOUTH  DAILY 90 tablet 1  . ibandronate (BONIVA) 150 MG tablet Take 1 tablet (150 mg total) by mouth every 30 (thirty) days. In AM w. full glass of h20 and do not take anything else by mouth for 30 mins 3 tablet 3  . levothyroxine (SYNTHROID, LEVOTHROID) 112  MCG tablet TAKE 1 TABLET BY MOUTH  DAILY 90 tablet 3  . Magnesium 250 MG TABS Take 1 tablet by mouth daily.    . metoprolol (TOPROL-XL) 50 MG 24 hr tablet Take 50 mg by mouth 2 (two) times daily.     . Multiple Vitamin (MULTI-VITAMIN DAILY) TABS Take by mouth daily.    . potassium chloride SA (K-DUR,KLOR-CON) 20 MEQ tablet TAKE 1 TABLET BY MOUTH  DAILY 90 tablet 1  . spironolactone (ALDACTONE) 25 MG tablet Take 25 mg by mouth 2 (two) times daily.     Marland Kitchen warfarin (COUMADIN) 5 MG tablet Take 1 tablet daily or AS DIRECTED BY ANTICOAGULATION CLINIC 90 DAY 95 tablet 1   No current facility-administered medications for this visit.     Allergies Allergies  Allergen Reactions  . Digoxin Other (See Comments)  . Statins     myalgia    Histories Past Medical History:  Diagnosis Date  . Anemia   . Aortic insufficiency   . AORTIC VALVE REPLACEMENT, HX OF 04/27/2007   Qualifier: Diagnosis of  By: Leanne Chang MD, Bruce    . Cancer (HCC)    breast  . CHF (congestive heart failure) ([place])   . Congestive heart failure ([place]) 08/03/2009   [facility] Dr. Paul Half. Last echo 2010 with EF 30%. Restrictive due to radiation. AVR MVR. 02/2014 visit planned.    . GI bleed   . Hodgkin's disease   . Hyperlipidemia   . Hypothyroidism   . Migraine   . MITRAL VALVE REPLACEMENT, HX OF 04/27/2007   Qualifier: Diagnosis of  By: Leanne Chang MD, Bruce    . WPW (Wolff-Parkinson-White syndrome)    Past Surgical History:  Procedure Laterality Date  . AORTIC VALVE SURGERY    . bilateral breast implants    . ENDOMETRIAL ABLATION    . MASTECTOMY    . MITRAL VALVE REPLACEMENT    . pericardial stripping  9/08  . SPLENECTOMY    . THORACOTOMY    . TONSILLECTOMY    . valvulopathy  06/13/06   Social History   Socioeconomic History  . Marital status: Married    Spouse name: Not on file  . Number of children: Not on file  . Years of education: Not on file  . Highest education level: Not on file  Occupational History  . Not on  file  Social Needs  . Financial resource strain: Not on file  . Food insecurity:    Worry: Not on file    Inability: Not on file  . Transportation needs:    Medical: Not on file    Non-medical: Not on file  Tobacco Use  . Smoking status: Never Smoker  . Smokeless tobacco: Never Used  Substance and Sexual Activity  . Alcohol use: No  . Drug use: No  . Sexual activity: Not on file  Lifestyle  . Physical activity:    Days per week: Not on file    Minutes per session: Not on file  . Stress: Not on file  Relationships  . Social connections:    Talks on phone: Not on file    Gets  together: Not on file    Attends religious service: Not on file    Active member of club or organization: Not on file    Attends meetings of clubs or organizations: Not on file    Relationship status: Not on file  . Intimate partner violence:    Fear of current or ex partner: Not on file    Emotionally abused: Not on file    Physically abused: Not on file    Forced sexual activity: Not on file  Other Topics Concern  . Not on file  Social History Narrative   Lived in [place] for 18 years. Moved here for the weather.    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      Husband retired January 2015. Married 1991 (2nd marriage). No kids.       Hobbies: walks 2 miles per day, read, work outside         Family History  Problem Relation Age of Onset  . Heart disease Mother   . Cancer Mother        breast  . Heart disease Maternal Aunt   . Cancer Maternal Aunt        uterine  . Heart disease Maternal Grandmother   . Diabetes Maternal Grandmother   . Hypercalcemia Neg Hx   . Colon cancer Neg Hx   . Esophageal cancer Neg Hx   . Liver disease Neg Hx   . Pancreatic cancer Neg Hx   . Rectal cancer Neg Hx   . Stomach cancer Neg Hx    I have reviewed her medical, social, and family history in detail and updated the electronic medical record  as necessary.    PHYSICAL EXAMINATION  BP 126/80   Pulse 70   Ht 5\' 6"  (1.676 m)   Wt 140 lb 6.4 oz (63.7 kg)   SpO2 96%   BMI 22.66 kg/m  Wt Readings from Last 3 Encounters:  07/16/18 140 lb 6.4 oz (63.7 kg)  06/30/18 137 lb (62.1 kg)  06/23/18 143 lb 6.1 oz (65 kg)  GEN: NAD, appears stated age, doesn't appear chronically ill PSYCH: Cooperative, without pressured speech EYE: Conjunctivae pink, sclerae anicteric ENT: MMM CV: RR with mitral click present, no R/Gs RESP: CTAB posteriorly, without wheezing GI: NABS, soft, NT/ND, without rebound or guarding, no HSM appreciated MSK/EXT: No lower extremity edema SKIN: No jaundice NEURO:  Alert & Oriented x 3, no focal deficits   REVIEW OF DATA  I reviewed the following data at the time of this encounter:  GI Procedures and Studies  No relevant studies to review  Laboratory Studies  Reviewed in epic  Imaging Studies  No relevant studies to review   ASSESSMENT  Ms. Lascala is a 68 y.o. 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  The patient is seen today for evaluation and management of:  1. Iron deficiency anemia, unspecified iron deficiency anemia type   2. Hx of long-term (current) use of anticoagulants    This is a clinically and hemodynamically stable patient who presents for follow-up in the setting of iron deficiency anemia of unclear etiology.  Although there is no overt GI symptoms to suspect a particular diagnosis in the work-up of iron deficiency anemia both an upper and lower endoscopy would be reasonable.  This would exclude angiectasia's, ulcer  disease, polyp disease as well as cancers of the upper GI tract and the colon.  We discussed the role and reasoning for [place] to think about procedures.  She obviously, with her double valves has concerned about coming off  anticoagulation and being bridged.  I share her concerns as well and at times based on how the patient and/or the patient's cardiologist/PCP want to handle things we sometimes consider Lovenox bridging versus heparin drip and in the hospital.  We will reach out to the patient's primary care provider in anticoagulation clinic here but she would also like the input from her primary cardiologist at [facility] Dr. Alycia Rossetti and see if they can help [place] understand what they would advise in regards to anticoagulation needs and potential bridging.  I would like her to initiate iron supplementation once daily and in 1 month return for lab work.  She is not having overt GI bleeding at this point I think it is reasonable for [place] to see how her labs are and then we discussed when she returns how she would like to proceed.  The risks and benefits of endoscopic evaluation were discussed with the patient; these include but are not limited to the risk of perforation, infection, bleeding, missed lesions, lack of diagnosis, severe illness requiring hospitalization, as well as anesthesia and sedation related illnesses.  We did discuss that there are other methods of doing colon cancer screening such as virtual colonoscopy and evaluation of the small bowel such as capsule endoscopy.  However, these are tests that are done for colon cancer screening and not diagnostic evaluation of anemia.  The capsule endoscopy itself is usually only covered through insurance after a person has had an upper and lower endoscopy to further evaluate sources of potential GI bleeding.  As we know, a virtual colonoscopy would not be able to see angiectasia's that could be playing a role or significant mucosal abnormalities that are not polyps.  Thus, I am not sure that pursuing a capsule endoscopy would be my first indication.  I would consider diagnostic endoscopy and colonoscopy if she could not come off anticoagulation safely but with her having to recognize the  increased risk for bleeding in that particular case.  The patient is agreeable to proceed.  All patient questions were answered, to the best of my ability, and the patient agrees to the aforementioned plan of action with follow-up as indicated.   PLAN  Repeat labs below in 1 month to see how things have trended Follow-up in clinic We will discuss with patient's primary care provider/anticoagulation clinic possible role of bridging and whether that is with Lovenox versus needing to come into the hospital for heparin bridge We will also send a release to her cardiologist at [facility] to see how they would like to proceed with her anticoagulation (they are not managing this currently but she would like their input) Iron once daily   Orders Placed This Encounter  Procedures  . CBC  . IBC panel  . Ferritin  . IgA  . Tissue transglutaminase, IgA    New Prescriptions   No medications on file   Modified Medications   No medications on file    Planned Follow Up: No follow-ups on file.   Justice Britain, MD [facility] Office # [phone]

## 2018-07-21 ENCOUNTER — Telehealth: Payer: Self-pay | Admitting: General Practice

## 2018-07-21 NOTE — Telephone Encounter (Signed)
-----   Message from Marin Olp, MD sent at 07/21/2018 11:15 AM EST ----- Regarding: RE: Lovenox Bridge Absolutely- you are amazing! Promise me you will not retire until I do please :).   Annie Main  ----- Message ----- From: Warden Fillers, RN Sent: 07/21/2018   9:26 AM EST To: Marin Olp, MD Subject: Lovenox Bridge                                 Hi Dr. [name],  Patient will be having an upper/lower GI in the near future.  She will need to stop coumadin for 5 days and will need a Lovenox bridge, I just need your permission to dose and order.  Thanks, Villa Herb, RN

## 2018-07-22 ENCOUNTER — Encounter: Payer: Self-pay | Admitting: Gastroenterology

## 2018-07-22 DIAGNOSIS — Z7901 Long term (current) use of anticoagulants: Secondary | ICD-10-CM | POA: Insufficient documentation

## 2018-07-22 DIAGNOSIS — Z9229 Personal history of other drug therapy: Secondary | ICD-10-CM | POA: Insufficient documentation

## 2018-07-27 ENCOUNTER — Telehealth: Payer: Self-pay | Admitting: General Practice

## 2018-07-27 NOTE — Telephone Encounter (Signed)
-----   Message from Marin Olp, MD sent at 07/21/2018 11:15 AM EST ----- Regarding: RE: Lovenox Bridge Absolutely- you are amazing! Promise me you will not retire until I do please :).   Annie Main  ----- Message ----- From: Warden Fillers, RN Sent: 07/21/2018   9:26 AM EST To: Marin Olp, MD Subject: Lovenox Bridge                                 Hi Dr. [name],  Patient will be having an upper/lower GI in the near future.  She will need to stop coumadin for 5 days and will need a Lovenox bridge, I just need your permission to dose and order.  Thanks, Villa Herb, RN

## 2018-07-31 ENCOUNTER — Encounter: Payer: Self-pay | Admitting: Gastroenterology

## 2018-07-31 ENCOUNTER — Ambulatory Visit: Payer: Medicare Other | Admitting: General Practice

## 2018-07-31 DIAGNOSIS — Z952 Presence of prosthetic heart valve: Secondary | ICD-10-CM | POA: Diagnosis not present

## 2018-07-31 DIAGNOSIS — Z7901 Long term (current) use of anticoagulants: Secondary | ICD-10-CM

## 2018-07-31 LAB — POCT INR: INR: 2.9 (ref 2.0–3.0)

## 2018-07-31 NOTE — Progress Notes (Signed)
Received documentation from Dr. Alycia Rossetti. Patient will need a Lovenox bridge for history of mechanical AVR and mechanical MVR and history of A. fib. We will scan these records into the chart. This will be documentation for [place] as we consider procedures down the road.   Justice Britain, MD [facility] Office # [phone]

## 2018-07-31 NOTE — Patient Instructions (Addendum)
Pre visit review using our clinic review tool, if applicable. No additional management support is needed unless otherwise documented below in the visit note.  Continue to take 1 tablet daily except 1 1/2 tablets every Friday.  Re-check in 4 weeks.      

## 2018-08-06 ENCOUNTER — Other Ambulatory Visit (INDEPENDENT_AMBULATORY_CARE_PROVIDER_SITE_OTHER): Payer: Medicare Other

## 2018-08-06 DIAGNOSIS — Z9229 Personal history of other drug therapy: Secondary | ICD-10-CM

## 2018-08-06 DIAGNOSIS — D509 Iron deficiency anemia, unspecified: Secondary | ICD-10-CM | POA: Diagnosis not present

## 2018-08-06 LAB — IBC PANEL
Iron: 225 ug/dL — ABNORMAL HIGH (ref 42–145)
Saturation Ratios: 48.3 % (ref 20.0–50.0)
Transferrin: 333 mg/dL (ref 212.0–360.0)

## 2018-08-06 LAB — CBC
HCT: 35.9 % — ABNORMAL LOW (ref 36.0–46.0)
Hemoglobin: 11.4 g/dL — ABNORMAL LOW (ref 12.0–15.0)
MCHC: 31.7 g/dL (ref 30.0–36.0)
MCV: 90 fl (ref 78.0–100.0)
Platelets: 443 10*3/uL — ABNORMAL HIGH (ref 150.0–400.0)
RBC: 3.98 Mil/uL (ref 3.87–5.11)
RDW: 18.6 % — ABNORMAL HIGH (ref 11.5–15.5)
WBC: 9.2 10*3/uL (ref 4.0–10.5)

## 2018-08-06 LAB — IGA: IgA: 258 mg/dL (ref 68–378)

## 2018-08-06 LAB — FERRITIN: Ferritin: 33 ng/mL (ref 10.0–291.0)

## 2018-08-07 LAB — TISSUE TRANSGLUTAMINASE, IGA: (tTG) Ab, IgA: 1 U/mL

## 2018-08-11 ENCOUNTER — Other Ambulatory Visit: Payer: Self-pay | Admitting: General Practice

## 2018-08-11 ENCOUNTER — Other Ambulatory Visit: Payer: Self-pay | Admitting: Family Medicine

## 2018-08-11 ENCOUNTER — Other Ambulatory Visit: Payer: Self-pay

## 2018-08-11 MED ORDER — IBANDRONATE SODIUM 150 MG PO TABS: 150.0000 mg | ORAL_TABLET | ORAL | 3 refills | Status: DC

## 2018-08-11 MED ORDER — WARFARIN SODIUM 5 MG PO TABS: ORAL_TABLET | ORAL | 1 refills | Status: DC

## 2018-08-11 NOTE — Progress Notes (Signed)
Thank you!  Yes, I see it now.:)

## 2018-08-11 NOTE — Progress Notes (Unsigned)
I actually did route to you, not sure if it just did not go through, but I know to route all coumadin refills to you.  Thank you!

## 2018-08-11 NOTE — Progress Notes (Signed)
Hi Jennifer!  I am the coumadin clinic RN for [facility].  In the future please route coumadin/Warfarin re-fills to me.  Thanks!

## 2018-08-17 ENCOUNTER — Other Ambulatory Visit: Payer: Self-pay

## 2018-08-17 DIAGNOSIS — D509 Iron deficiency anemia, unspecified: Secondary | ICD-10-CM

## 2018-08-17 DIAGNOSIS — Z9229 Personal history of other drug therapy: Secondary | ICD-10-CM

## 2018-08-17 NOTE — Progress Notes (Signed)
Per Dr Rush Landmark pt to have labs done 1-2 days prior to visit on 09/15/2018. Lab order enter, pt informed.

## 2018-08-28 ENCOUNTER — Ambulatory Visit (INDEPENDENT_AMBULATORY_CARE_PROVIDER_SITE_OTHER): Payer: Medicare Other | Admitting: General Practice

## 2018-08-28 DIAGNOSIS — Z952 Presence of prosthetic heart valve: Secondary | ICD-10-CM | POA: Diagnosis not present

## 2018-08-28 DIAGNOSIS — Z7901 Long term (current) use of anticoagulants: Secondary | ICD-10-CM

## 2018-08-28 LAB — POCT INR: INR: 4.2 — AB (ref 2.0–3.0)

## 2018-08-28 NOTE — Patient Instructions (Signed)
Pre visit review using our clinic review tool, if applicable. No additional management support is needed unless otherwise documented below in the visit note.  Skip coumadin today and then change dosage and take 5 mg daily.  Encouraged patient to stop NSAIDS due to interaction with coumadin. Re-check in 3 to 4 weeks.

## 2018-08-29 ENCOUNTER — Other Ambulatory Visit: Payer: Self-pay | Admitting: Family Medicine

## 2018-08-29 MED ORDER — IBANDRONATE SODIUM 150 MG PO TABS: 150.0000 mg | ORAL_TABLET | ORAL | 3 refills | Status: DC

## 2018-08-29 NOTE — Progress Notes (Signed)
Change boniva to 60 minutes before food

## 2018-09-11 ENCOUNTER — Other Ambulatory Visit (INDEPENDENT_AMBULATORY_CARE_PROVIDER_SITE_OTHER): Payer: Medicare Other

## 2018-09-11 DIAGNOSIS — Z7901 Long term (current) use of anticoagulants: Secondary | ICD-10-CM

## 2018-09-11 DIAGNOSIS — D509 Iron deficiency anemia, unspecified: Secondary | ICD-10-CM

## 2018-09-11 DIAGNOSIS — Z9229 Personal history of other drug therapy: Secondary | ICD-10-CM

## 2018-09-11 LAB — CBC
HCT: 35.3 % — ABNORMAL LOW (ref 36.0–46.0)
Hemoglobin: 11.9 g/dL — ABNORMAL LOW (ref 12.0–15.0)
MCHC: 33.7 g/dL (ref 30.0–36.0)
MCV: 92.7 fl (ref 78.0–100.0)
Platelets: 532 10*3/uL — ABNORMAL HIGH (ref 150.0–400.0)
RBC: 3.81 Mil/uL — ABNORMAL LOW (ref 3.87–5.11)
RDW: 15.5 % (ref 11.5–15.5)
WBC: 10.1 10*3/uL (ref 4.0–10.5)

## 2018-09-11 LAB — FERRITIN: Ferritin: 44.8 ng/mL (ref 10.0–291.0)

## 2018-09-11 LAB — IRON: Iron: 35 ug/dL — ABNORMAL LOW (ref 42–145)

## 2018-09-12 LAB — IRON, TOTAL/TOTAL IRON BINDING CAP
%SAT: 12 % (calc) — ABNORMAL LOW (ref 16–45)
Iron: 51 ug/dL (ref 45–160)
TIBC: 411 mcg/dL (calc) (ref 250–450)

## 2018-09-15 ENCOUNTER — Other Ambulatory Visit: Payer: Self-pay

## 2018-09-15 ENCOUNTER — Encounter: Payer: Self-pay | Admitting: Gastroenterology

## 2018-09-15 ENCOUNTER — Ambulatory Visit (INDEPENDENT_AMBULATORY_CARE_PROVIDER_SITE_OTHER): Payer: Medicare Other | Admitting: Gastroenterology

## 2018-09-15 DIAGNOSIS — D509 Iron deficiency anemia, unspecified: Secondary | ICD-10-CM | POA: Diagnosis not present

## 2018-09-15 NOTE — Patient Instructions (Signed)
Your provider has requested that you go to the basement level for lab work in 4 weeks (around 10/15/18). Press "B" on the elevator. The lab is located at the first door on the left as you exit the elevator.  We will contact you to schedule your EGD/Colonoscopy at the hospital in 8 weeks when our schedule becomes available. You will need Lovenox bridging which has already been discussed with the coumadin clinic.

## 2018-09-15 NOTE — Progress Notes (Signed)
[place] VISIT   Primary Care Provider Marin Olp, [place] [id number] [phone]  Patient Profile: KYLIANA STANDEN is a 68 y.o. 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  The patient presents to the [facility] for an evaluation and management of problem(s) noted below:  Problem List 1. Iron deficiency anemia, unspecified iron deficiency anemia type   2. Microcytic anemia     History of Present Illness: Please see initial consultation note from [place] [facility] and my subsequent follow-up note for full details of HPI.    I connected with  Cordella Register on 09/17/18. I verified that I was speaking with the correct person using two identifiers. Due to the COVID-19 Pandemic, this service was provided via telemedicine using Audio/Visual Media. The patient was located at home. The provider was located in the office. The patient did consent to this visit and is aware of charges through their insurance as well as the limitations of evaluation and management by telemedicine. Other persons participating in this telemedicine service were none.  Interval History The patient has been feeling and doing well.  She is not reporting any significant issues of gastrointestinal hemorrhage including melena or hematochezia.  She is taking her iron once daily.  She got labs performed which we are reviewing today and show some persistent iron deficiency in the setting of a low iron saturation but her hemoglobin itself has slightly improved as well.  We ended up having received information from her cardiologist as well as her primary care provider and if we move forward with endoscopic evaluation they would like her to be Lovenox bridge.  GI Review of Systems Positive as above  Negative for abdominal pain, nausea, vomiting, dysphagia, change in bowel habits  Review of Systems General: Denies fevers/chills/weight loss Cardiovascular: Denies chest pain/palpitations Pulmonary: Denies shortness of breath Gastroenterological: See HPI Genitourinary: Denies darkened urine or hematuria Hematological: Positive for easy bruising/bleeding due to Coumadin Dermatological: Denies jaundice Psychological: Mood is stable   Medications Current Outpatient Medications  Medication Sig Dispense Refill  . aspirin 81 MG tablet Take 81 mg by mouth daily.      Marland Kitchen aspirin-acetaminophen-caffeine (EXCEDRIN MIGRAINE) 250-250-65 MG tablet Take 2 tablets by mouth every 6 (six) hours as needed for headache.    . benzonatate (TESSALON) 100 MG capsule Take 1 capsule (100 mg total) by mouth 3 (three) times daily as needed for cough. 20 capsule 1  . diphenhydramine-acetaminophen (TYLENOL PM) 25-500 MG TABS tablet Take 1 tablet by mouth at bedtime as needed.    . docusate sodium (COLACE) 100 MG capsule Take 100 mg by mouth daily.     Marland Kitchen ezetimibe (ZETIA) 10 MG tablet Take 10 mg by mouth daily.     Marland Kitchen FERROUS SULFATE PO Take 325 mg by mouth 2 (two) times a day.     . furosemide (LASIX) 40 MG tablet TAKE 1 TABLET BY MOUTH  DAILY 90 tablet 1  . ibandronate (BONIVA) 150 MG tablet Take 1 tablet (150 mg total) by mouth every 30 (thirty) days. In AM w. full glass of h20 and do not take anything else by mouth for 60 mins 3 tablet 3  . levothyroxine (SYNTHROID, LEVOTHROID) 112 MCG tablet TAKE 1 TABLET BY MOUTH  DAILY 90 tablet 3  . Magnesium 250 MG TABS Take 1 tablet by mouth  daily.    . metoprolol (TOPROL-XL) 50 MG 24 hr tablet Take 50 mg by mouth 2 (two) times daily.     . Multiple Vitamin (MULTI-VITAMIN DAILY) TABS Take by mouth daily.    . potassium chloride SA (K-DUR,KLOR-CON) 20 MEQ tablet TAKE 1 TABLET BY MOUTH  DAILY 90 tablet 1  . spironolactone (ALDACTONE) 25 MG tablet Take 25 mg by mouth 2 (two)  times daily.     Marland Kitchen warfarin (COUMADIN) 5 MG tablet Take 1 tablet daily except 1 1/2 tablets on Fridays or AS DIRECTED BY ANTICOAGULATION CLINIC 90 DAY 100 tablet 1   No current facility-administered medications for this visit.     Allergies Allergies  Allergen Reactions  . Digoxin Other (See Comments)  . Statins     myalgia    Histories Past Medical History:  Diagnosis Date  . Anemia   . Aortic insufficiency   . AORTIC VALVE REPLACEMENT, HX OF 04/27/2007   Qualifier: Diagnosis of  By: Leanne Chang MD, Bruce    . Cancer (HCC)    breast  . CHF (congestive heart failure) ([place])   . Congestive heart failure ([place]) 08/03/2009   [facility] Dr. Paul Half. Last echo 2010 with EF 30%. Restrictive due to radiation. AVR MVR. 02/2014 visit planned.    . GI bleed   . Hodgkin's disease   . Hyperlipidemia   . Hypothyroidism   . Migraine   . MITRAL VALVE REPLACEMENT, HX OF 04/27/2007   Qualifier: Diagnosis of  By: Leanne Chang MD, Bruce    . WPW (Wolff-Parkinson-White syndrome)    Past Surgical History:  Procedure Laterality Date  . AORTIC VALVE SURGERY    . bilateral breast implants    . ENDOMETRIAL ABLATION    . MASTECTOMY    . MITRAL VALVE REPLACEMENT    . pericardial stripping  9/08  . SPLENECTOMY    . THORACOTOMY    . TONSILLECTOMY    . valvulopathy  06/13/06   Social History   Socioeconomic History  . Marital status: Married    Spouse name: Not on file  . Number of children: Not on file  . Years of education: Not on file  . Highest education level: Not on file  Occupational History  . Not on file  Social Needs  . Financial resource strain: Not on file  . Food insecurity:    Worry: Not on file    Inability: Not on file  . Transportation needs:    Medical: Not on file    Non-medical: Not on file  Tobacco Use  . Smoking status: Never Smoker  . Smokeless tobacco: Never Used  Substance and Sexual Activity  . Alcohol use: No  . Drug use: No  . Sexual activity: Not on file  Lifestyle   . Physical activity:    Days per week: Not on file    Minutes per session: Not on file  . Stress: Not on file  Relationships  . Social connections:    Talks on phone: Not on file    Gets together: Not on file    Attends religious service: Not on file    Active member of club or organization: Not on file    Attends meetings of clubs or organizations: Not on file    Relationship status: Not on file  . Intimate partner violence:    Fear of current or ex partner: Not on file    Emotionally abused: Not on file    Physically abused: Not on file  Forced sexual activity: Not on file  Other Topics Concern  . Not on file  Social History Narrative   Lived in [place] for 18 years. Moved here for the weather.    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      Husband retired January 2015. Married 1991 (2nd marriage). No kids.       Hobbies: walks 2 miles per day, read, work outside         Family History  Problem Relation Age of Onset  . Heart disease Mother   . Cancer Mother        breast  . Heart disease Maternal Aunt   . Cancer Maternal Aunt        uterine  . Heart disease Maternal Grandmother   . Diabetes Maternal Grandmother   . Hypercalcemia Neg Hx   . Colon cancer Neg Hx   . Esophageal cancer Neg Hx   . Liver disease Neg Hx   . Pancreatic cancer Neg Hx   . Rectal cancer Neg Hx   . Stomach cancer Neg Hx    I have reviewed her medical, social, and family history in detail and updated the electronic medical record as necessary.    PHYSICAL EXAMINATION  Telehealth visit   REVIEW OF DATA  I reviewed the following data at the time of this encounter:  GI Procedures and Studies  No relevant studies to review  Laboratory Studies  Reviewed in epic   Imaging Studies  No relevant studies to review   ASSESSMENT  Ms. Heck is a 68 y.o. female with a pmh significant for CHFrEF, Breast cancer survivor,  AVR/MVR (on Coumadin 2.5-3.5), prior Hodgkin's Lymphoma (s/p Radiation & Chemo), Hypothyroidism, HLD, iron deficiency anemia.  The patient is seen today for evaluation and management of:  1. Iron deficiency anemia, unspecified iron deficiency anemia type   2. Microcytic anemia    The patient is hemodynamically and clinically stable at this point in time with improving hemoglobin.  With the persistent iron deficiency I do think that evaluating her GI tract is reasonable.  We have gotten permission to proceed with an upper and lower endoscopy with Lovenox bridging per her cardiologist's recommendation.  The patient's hemogram has improved with iron and we will maintain her on iron.  We will increase her to twice daily oral iron.  We will recheck her labs in 1 month if she still has iron deficiency will get her set up for an IV iron infusion.  In approximately 6 to 8 weeks we will plan to proceed with an upper and lower endoscopy and we will do that with intention of keeping those dates unless her repeat labs in 4 weeks look worse.  The risks and benefits of endoscopic evaluation were discussed with the patient; these include but are not limited to the risk of perforation, infection, bleeding, missed lesions, lack of diagnosis, severe illness requiring hospitalization, as well as anesthesia and sedation related illnesses.  The patient is agreeable to proceed.  All patient questions were answered, to the best of my ability, and the patient agrees to the aforementioned plan of action with follow-up as indicated.   PLAN  Bloodwork in 1 month Increase Iron to BID orally Consider IV Iron based on repeat iron studies 8-weeks EGD/Colon on Lovenox Bridge in Hospital due to Valvular heart disease   Orders Placed This Encounter  Procedures  . CBC with Differential/Platelet  . IBC +  Ferritin    New Prescriptions   No medications on file   Modified Medications   No medications on file    Planned Follow  Up: No follow-ups on file.   Justice Britain, MD [facility] Office # [phone]

## 2018-09-17 DIAGNOSIS — D509 Iron deficiency anemia, unspecified: Secondary | ICD-10-CM | POA: Insufficient documentation

## 2018-09-18 ENCOUNTER — Ambulatory Visit (INDEPENDENT_AMBULATORY_CARE_PROVIDER_SITE_OTHER): Payer: Medicare Other | Admitting: General Practice

## 2018-09-18 DIAGNOSIS — Z7901 Long term (current) use of anticoagulants: Secondary | ICD-10-CM

## 2018-09-18 DIAGNOSIS — Z952 Presence of prosthetic heart valve: Secondary | ICD-10-CM | POA: Diagnosis not present

## 2018-09-18 LAB — POCT INR: INR: 3.5 — AB (ref 2.0–3.0)

## 2018-09-18 NOTE — Patient Instructions (Addendum)
Pre visit review using our clinic review tool, if applicable. No additional management support is needed unless otherwise documented below in the visit note.  Take 1/2 tablet today and then take 5 mg daily.  Encouraged patient to stop NSAIDS due to interaction with coumadin. Re-check in 6 weeks.

## 2018-09-24 ENCOUNTER — Encounter: Payer: Self-pay | Admitting: Family Medicine

## 2018-09-24 NOTE — Telephone Encounter (Signed)
I spoke with pt on the phone and schedule her appointment for Monday, May 4.

## 2018-09-24 NOTE — Telephone Encounter (Signed)
Please see message and advise.  Thank you. Patient has a Virtual visit with on May 14th.

## 2018-09-25 ENCOUNTER — Encounter: Payer: Self-pay | Admitting: Family Medicine

## 2018-09-25 NOTE — Patient Instructions (Addendum)
Health Maintenance Due  Topic Date Due  . CT Virtual Colonoscopy Will discuss with Dr. [name] at future visit- following with Dr. Rush Landmark at present 01/27/2018    Depression screen [facility] 2/9 04/28/2018 12/20/2016 10/19/2015  Decreased Interest 0 0 0  Down, Depressed, Hopeless 0 0 0  PHQ - 2 Score 0 0 0  Some recent data might be hidden   Video visit

## 2018-09-25 NOTE — Progress Notes (Addendum)
Phone [phone]   Subjective:  Virtual visit via Video note. Chief complaint: Chief Complaint  Patient presents with  . Back Pain    x6weeks    This visit type was conducted due to national recommendations for restrictions regarding the COVID-19 Pandemic (e.g. social distancing).  This format is felt to be most appropriate for this patient at this time balancing risks to patient and risks to population by having him in for in person visit.  No physical exam was performed (except for noted visual exam or audio findings with Telehealth visits).    Our team/I connected with Grace [name] on 09/28/18 at  8:20 AM EDT by a video enabled telemedicine application ([email or web address]) and verified that I am speaking with the correct person using two identifiers.  Location patient: Home[place] Location provider: [facility], office Persons participating in the virtual visit:  patient  Our team/I discussed the limitations of evaluation and management by telemedicine and the availability of in person appointments. In light of current covid-19 pandemic, patient also understands that we are trying to protect them by minimizing in office contact if at all possible.  The patient expressed consent for telemedicine visit and agreed to proceed. Patient understands insurance will be billed.   ROS-no fever, chills, cough, congestion.  No reported weakness in the leg.  No reported new incontinence.  Past Medical History-  Patient Active Problem List   Diagnosis Date Noted  . Atrial flutter ([place]) 12/20/2016    Priority: High  . Osteoporosis. check vitamin D yearly. 01/17/2014    Priority: High  . H/O aortic valve replacement and mitral valve replacement. 01/07/2013    Priority: High  . WOLFF (WOLFE)-PARKINSON-WHITE (WPW) SYNDROME 08/16/2009    Priority: High  . Congestive heart failure ([place]) 08/03/2009    Priority: High  . TRANSIENT ISCHEMIC ATTACKS, HX OF 04/27/2007    Priority: High  . History of Hodgkin's  disease 11/03/2006    Priority: High  . Hypertension 12/20/2016    Priority: Medium  . CKD (chronic kidney disease), stage III ([place]) 01/18/2014    Priority: Medium  . Iron deficiency anemia 08/16/2009    Priority: Medium  . Hyperlipidemia 08/29/2008    Priority: Medium  . Hypothyroidism 11/03/2006    Priority: Medium  . COMMON MIGRAINE 11/03/2006    Priority: Medium  . BREAST CANCER, HX OF 11/03/2006    Priority: Medium  . Long term (current) use of anticoagulants 12/23/2012    Priority: Low  . GASTROINTESTINAL HEMORRHAGE, HX OF 11/03/2006    Priority: Low  . Microcytic anemia 09/17/2018  . Hx of long-term (current) use of anticoagulants 07/22/2018  . Hypercalcemia 12/23/2017    Medications- reviewed and updated Current Outpatient Medications  Medication Sig Dispense Refill  . aspirin 81 MG tablet Take 81 mg by mouth daily.      Marland Kitchen aspirin-acetaminophen-caffeine (EXCEDRIN MIGRAINE) 250-250-65 MG tablet Take 2 tablets by mouth every 6 (six) hours as needed for headache.    . diphenhydramine-acetaminophen (TYLENOL PM) 25-500 MG TABS tablet Take 1 tablet by mouth at bedtime as needed.    . docusate sodium (COLACE) 100 MG capsule Take 100 mg by mouth daily.     Marland Kitchen ezetimibe (ZETIA) 10 MG tablet Take 10 mg by mouth daily.     Marland Kitchen FERROUS SULFATE PO Take 325 mg by mouth 2 (two) times a day.     . furosemide (LASIX) 40 MG tablet TAKE 1 TABLET BY MOUTH  DAILY 90 tablet 1  . ibandronate (  BONIVA) 150 MG tablet Take 1 tablet (150 mg total) by mouth every 30 (thirty) days. In AM w. full glass of h20 and do not take anything else by mouth for 60 mins 3 tablet 3  . levothyroxine (SYNTHROID, LEVOTHROID) 112 MCG tablet TAKE 1 TABLET BY MOUTH  DAILY 90 tablet 3  . Magnesium 250 MG TABS Take 1 tablet by mouth daily.    . metoprolol (TOPROL-XL) 50 MG 24 hr tablet Take 50 mg by mouth 2 (two) times daily.     . Multiple Vitamin (MULTI-VITAMIN DAILY) TABS Take by mouth daily.    . potassium chloride SA  (K-DUR,KLOR-CON) 20 MEQ tablet TAKE 1 TABLET BY MOUTH  DAILY 90 tablet 1  . spironolactone (ALDACTONE) 25 MG tablet Take 25 mg by mouth 2 (two) times daily.     Marland Kitchen warfarin (COUMADIN) 5 MG tablet Take 1 tablet daily except 1 1/2 tablets on Fridays or AS DIRECTED BY ANTICOAGULATION CLINIC 90 DAY 100 tablet 1   No current facility-administered medications for this visit.      Objective:  BP 123/64 (BP Location: Left Arm, Patient Position: Sitting, Cuff Size: Normal)   Pulse 82   Temp 97.6 F (36.4 C) (Oral)   Ht 5\' 6"  (1.676 m)   Wt 138 lb (62.6 kg)   BMI 22.27 kg/m  Gen: NAD, resting comfortably Lungs: nonlabored, normal respiratory rate  Skin: appears dry, no obvious rash, multiple spider veins present on the right leg, on the left leg there are multiple spider veins with some bruising and also several varicose veins noted.      Assessment and Plan   # Back Pain initially- now primarily left hip pain S: march 14th weekend was out in backyard doing yardwork- next week felt like could barely walk- pain was along the tailbone. 2 weeks walked with a cane and then it started to get better. Then started noting some groin pain on the left. Also notes several varicose veins that have popped up in left leg. Back several months ago had significant hematoma in left buttocks-that seemed to heal up within about 2 weeks. TYlenol was not helpful for pain. excedrin migraine seemed to help pain for four hours then came back. Recently had higher INR at 4.2 so wants to be cautious about excedrin. Still limping due to the pain in the groin. Back pain has resolved. Still having the groin pain but not having to use her walker. 10/10 when started- now down to mild stiffness in AM- as day goes on can get up to 5-7/10.  She does seem to have pain when she moves the hip-also has pain when pushing on varicose veins.  She really wants to avoid leaving the house if at all possible but with precautions we are taking in  our office- she would feel comfortable coming here for x-rays. A/P: Patient with pain along varicose veins in the left leg but also with groin pain which seems to be her most significant issue- this is relieved by NSAIDs- I wonder if she has some underlying hip arthritis-we will have her by for left hip x-ray.  If no obvious arthritis is noted-may consider venous duplex of the left leg- she has spider veins on both legs but the varicose veins on the left and pain along varicose veins makes me consider imaging or vascular referral- particularly with prior hematoma in the left buttocks- wonder if that hematoma may have affected drainage though.  We should avoid Excedrin due to  her being on Coumadin.  She is going to come by tomorrow for x-ray-she knows she needs to be symptom-free from COVID-19 which she is at present and she will wear a mask. -Pain also could be coming from the back but distribution in the groin and along varicose veins seems atypical for this-may consider back imaging in the future - for pain along varicose veins- is going to try compression stockings from [employer] in [place] Addendum-x-ray without obvious arthritis-we will get venous duplex of the left leg to further assess.  May consider vascular consult if compression stockings do not help with pain or worrisome findings on venous duplex.  Chronic systolic congestive heart failure ([place]), Chronic S: Compliant with Lasix 40 mg daily.  Does not report bilateral leg swelling outside of varicose veins particular in the left.  Also compliant with spironolactone 25 mg twice daily.  Denies recent weight gain A/P: Stable. Continue current medications.    Typical atrial flutter ([place]), Chronic S: Compliant with Coumadin.  Compliant with metoprolol A/P: Given patient has been on Coumadin-strongly doubt DVT as cause of varicosities on the left.  Rate controlled and anticoagulated-continue current medication   Other notes: 1. Has been doing self  isolation since march 12th. Has only been out to Dr. Joycelyn Man and INR checks- plans for labs every 4 weeks- up to twice a day on iron.    Future Appointments  Date Time Provider [place]  10/08/2018  9:40 AM Marin Olp, MD [facility]  10/30/2018  8:00 AM [facility]   Lab/Order associations: Left groin pain - Plan: DG HIP UNILAT W OR W/O PELVIS 2-3 VIEWS LEFT  Return precautions advised.  Garret Reddish, MD

## 2018-09-28 ENCOUNTER — Ambulatory Visit (INDEPENDENT_AMBULATORY_CARE_PROVIDER_SITE_OTHER): Payer: Medicare Other | Admitting: Family Medicine

## 2018-09-28 ENCOUNTER — Other Ambulatory Visit: Payer: Medicare Other

## 2018-09-28 ENCOUNTER — Ambulatory Visit (INDEPENDENT_AMBULATORY_CARE_PROVIDER_SITE_OTHER): Payer: Medicare Other

## 2018-09-28 VITALS — BP 123/64 | HR 82 | Temp 97.6°F | Ht 66.0 in | Wt 138.0 lb

## 2018-09-28 DIAGNOSIS — R1032 Left lower quadrant pain: Secondary | ICD-10-CM

## 2018-09-28 DIAGNOSIS — I483 Typical atrial flutter: Secondary | ICD-10-CM | POA: Diagnosis not present

## 2018-09-28 DIAGNOSIS — I5022 Chronic systolic (congestive) heart failure: Secondary | ICD-10-CM

## 2018-09-28 DIAGNOSIS — I83892 Varicose veins of left lower extremities with other complications: Secondary | ICD-10-CM

## 2018-09-28 NOTE — Addendum Note (Signed)
Addended by: Marin Olp on: 09/28/2018 08:10 PM   Modules accepted: Orders

## 2018-09-28 NOTE — Addendum Note (Signed)
Addended by: Marin Olp on: 09/28/2018 10:07 PM   Modules accepted: Level of Service

## 2018-09-30 ENCOUNTER — Ambulatory Visit (HOSPITAL_COMMUNITY)
Admission: RE | Admit: 2018-09-30 | Discharge: 2018-09-30 | Disposition: A | Discharge status: Home / Self Care | Payer: Medicare Other | Source: Ambulatory Visit | Attending: Family Medicine | Admitting: Family Medicine

## 2018-09-30 ENCOUNTER — Encounter (HOSPITAL_COMMUNITY): Payer: Self-pay

## 2018-09-30 ENCOUNTER — Other Ambulatory Visit: Payer: Self-pay

## 2018-09-30 ENCOUNTER — Telehealth: Payer: Self-pay

## 2018-09-30 DIAGNOSIS — R1032 Left lower quadrant pain: Secondary | ICD-10-CM | POA: Insufficient documentation

## 2018-09-30 DIAGNOSIS — I83892 Varicose veins of left lower extremities with other complications: Secondary | ICD-10-CM | POA: Diagnosis present

## 2018-09-30 NOTE — Telephone Encounter (Signed)
Received a call from [facility] (Cardiology)  To inform me that the patient was neg for DVT but did have a Baker's cyst behind lt knee.  Dr. [name] informed.

## 2018-09-30 NOTE — Progress Notes (Unsigned)
The left lower venous has been completed and is negative for DVT. Small 2.5 cm Baker's cyst seen.  Preliminary results can be found under CV proc through chart review.  Cira Servant, RVT [facility]

## 2018-10-01 ENCOUNTER — Other Ambulatory Visit: Payer: Self-pay

## 2018-10-01 DIAGNOSIS — M79605 Pain in left leg: Secondary | ICD-10-CM

## 2018-10-01 DIAGNOSIS — R1032 Left lower quadrant pain: Secondary | ICD-10-CM

## 2018-10-01 NOTE — Progress Notes (Signed)
Referral sent.  Please see other note.

## 2018-10-01 NOTE — Progress Notes (Signed)
Referral sent today

## 2018-10-07 ENCOUNTER — Encounter: Payer: Self-pay | Admitting: Family Medicine

## 2018-10-07 NOTE — Patient Instructions (Addendum)
  Grace [name] , Thank you for taking time to come for your Medicare Wellness Visit. I appreciate your ongoing commitment to your health goals. Please review the following plan we discussed and let me know if I can assist you in the future.   These are the goals we discussed: 1. Target 150 minutes exercise per week 2. Maintain current weight   This is a list of the screening recommended for you and due dates:  Health Maintenance  Topic Date Due  . Colon Cancer Screening  02/06/2013  . Flu Shot  12/26/2018  . Mammogram  03/17/2020  . Tetanus Vaccine  03/27/2023  . DEXA scan (bone density measurement)  Completed  .  Hepatitis C: One time screening is recommended by Center for Disease Control  (CDC) for  adults born from [birth year] through 1965.   Completed  . Pneumonia vaccines  Completed

## 2018-10-08 ENCOUNTER — Ambulatory Visit: Payer: Medicare Other

## 2018-10-08 ENCOUNTER — Ambulatory Visit (INDEPENDENT_AMBULATORY_CARE_PROVIDER_SITE_OTHER): Payer: Medicare Other | Admitting: Family Medicine

## 2018-10-08 ENCOUNTER — Other Ambulatory Visit: Payer: Self-pay | Admitting: Family Medicine

## 2018-10-08 VITALS — BP 115/56 | HR 84 | Temp 97.7°F | Ht 66.0 in | Wt 137.0 lb

## 2018-10-08 DIAGNOSIS — Z0000 Encounter for general adult medical examination without abnormal findings: Secondary | ICD-10-CM

## 2018-10-08 DIAGNOSIS — I483 Typical atrial flutter: Secondary | ICD-10-CM | POA: Diagnosis not present

## 2018-10-08 DIAGNOSIS — N183 Chronic kidney disease, stage 3 unspecified: Secondary | ICD-10-CM

## 2018-10-08 NOTE — Progress Notes (Signed)
Phone [phone]   Subjective:  Virtual visit via Video note. Chief complaint: Chief Complaint  Patient presents with  . Annual Exam  Patient presents today for their annual wellness visit.   This visit type was conducted due to national recommendations for restrictions regarding the COVID-19 Pandemic (e.g. social distancing).  This format is felt to be most appropriate for this patient at this time balancing risks to patient and risks to population by having him in for in person visit.  No physical exam was performed (except for noted visual exam or audio findings with Telehealth visits).    Our team/I connected with Grace [name] at  9:40 AM EDT by a video enabled telemedicine application (doxy.me or caregility through epic) and verified that I am speaking with the correct person using two identifiers.  Location patient: Home[place] Location provider: [facility], office Persons participating in the virtual visit:  patient  Our team/I discussed the limitations of evaluation and management by telemedicine and the availability of in person appointments. In light of current covid-19 pandemic, patient also understands that we are trying to protect them by minimizing in office contact if at all possible.  The patient expressed consent for telemedicine visit and agreed to proceed. Patient understands insurance will be billed.   Preventive Screening-Counseling & Management  Smoking Status: Never Smoker Second Hand Smoking status: No smokers in home No alcohol intake  Risk Factors Regular exercise: walking 3-4x a week outside of when back/groin bothering her. Can walk up to 60 mins Diet: eating a reasonably balanced diet- doing a good balance of plant based foods  Fall Risk: None  Fall Risk  10/08/2018 04/28/2018 12/20/2016 10/19/2015  Falls in the past year? 0 0 No No  Opioid use history:  no long term opioids use- outside of migraine history years ago- was on oxycodone- but ultimately botox  was the most helpful.   Cardiac risk factors:  advanced age (older than 15 for men, 43 for women)  Known atrial flutter, valve replacement x2 CHF- follows up with [facility] Hyperlipidemia - but statin intolerant but has tolerated zetia over last year No diabetes.  Family History: heart disease in mom late 90s  Hypertension is controlled  Depression Screen None. PHQ2 0  Depression screen [facility] 2/9 10/08/2018 04/28/2018 12/20/2016 10/19/2015  Decreased Interest 0 0 0 0  Down, Depressed, Hopeless 0 0 0 0  PHQ - 2 Score 0 0 0 0  Some recent data might be hidden    Activities of Daily Living Independent ADLs and IADLs   Hearing Difficulties: -patient declines  Cognitive Testing             No reported trouble.   Mini cog is normal with 2/3 recall and normal clock draw  List the Names of Other Physician/Practitioners you currently use: -Dr. Rush Landmark GI -Dr. Elba Barman cardiology -[facility] dermatology [place], [place] - Dr. Lester Kinsman dentist -GYN Laurin Coder, NP  -Dr. [place] for eye care  Advanced directives She has living will and husband is HCPOA- registered with [place] registry Just updated this with lawyer- she is her [place]  Immunization History  Administered Date(s) Administered  . Influenza Split 03/21/2011, 03/24/2012  . Influenza Whole 03/16/2008, 04/27/2009, 03/01/2010  . Influenza, High Dose Seasonal PF 02/28/2016, 03/14/2017, 02/27/2018  . Influenza,inj,Quad PF,6+ Mos 03/12/2013, 03/15/2014, 03/14/2015  . Influenza-Unspecified 02/24/2014, 03/14/2015  . Pneumococcal Conjugate-13 03/26/2013  . Pneumococcal Polysaccharide-23 02/25/2003, 10/19/2015  . Td 06/24/2002, 03/26/2013  . Tdap 03/26/2013  . Zoster 04/24/2011  Required Immunizations needed today - discussed shingrix shot after things calm down from covid 19  Screening tests- up to date - Patient has planned egd/colonoscopy with lovenox bridge in hospital due to valvular heart disease - mammogram 02/2018 -- still sees GYN  but does not do pap smears- last one 2017  ROS- No pertinent positives discovered in course of AWV - does have lingering groin pain and some back pain but much better 1-2/10  The following were reviewed and entered/updated in epic: Past Medical History:  Diagnosis Date  . Anemia   . Aortic insufficiency   . AORTIC VALVE REPLACEMENT, HX OF 04/27/2007   Qualifier: Diagnosis of  By: Leanne Chang MD, Bruce    . Cancer (HCC)    breast  . CHF (congestive heart failure) ([place])   . Congestive heart failure ([place]) 08/03/2009   [facility] Dr. Paul Half. Last echo 2010 with EF 30%. Restrictive due to radiation. AVR MVR. 02/2014 visit planned.    . GI bleed   . Hodgkin's disease   . Hyperlipidemia   . Hypothyroidism   . Migraine   . MITRAL VALVE REPLACEMENT, HX OF 04/27/2007   Qualifier: Diagnosis of  By: Leanne Chang MD, Bruce    . WPW (Wolff-Parkinson-White syndrome)    Patient Active Problem List   Diagnosis Date Noted  . Atrial flutter ([place]) 12/20/2016    Priority: High  . Osteoporosis. check vitamin D yearly. 01/17/2014    Priority: High  . H/O aortic valve replacement and mitral valve replacement. 01/07/2013    Priority: High  . WOLFF (WOLFE)-PARKINSON-WHITE (WPW) SYNDROME 08/16/2009    Priority: High  . Congestive heart failure ([place]) 08/03/2009    Priority: High  . TRANSIENT ISCHEMIC ATTACKS, HX OF 04/27/2007    Priority: High  . History of Hodgkin's disease 11/03/2006    Priority: High  . Hypertension 12/20/2016    Priority: Medium  . CKD (chronic kidney disease), stage III ([place]) 01/18/2014    Priority: Medium  . Iron deficiency anemia 08/16/2009    Priority: Medium  . Hyperlipidemia 08/29/2008    Priority: Medium  . Hypothyroidism 11/03/2006    Priority: Medium  . COMMON MIGRAINE 11/03/2006    Priority: Medium  . BREAST CANCER, HX OF 11/03/2006    Priority: Medium  . Long term (current) use of anticoagulants 12/23/2012    Priority: Low  . GASTROINTESTINAL HEMORRHAGE, HX OF 11/03/2006     Priority: Low  . Microcytic anemia 09/17/2018  . Hx of long-term (current) use of anticoagulants 07/22/2018  . Hypercalcemia 12/23/2017   Past Surgical History:  Procedure Laterality Date  . AORTIC VALVE SURGERY    . bilateral breast implants    . ENDOMETRIAL ABLATION    . MASTECTOMY    . MITRAL VALVE REPLACEMENT    . pericardial stripping  9/08  . SPLENECTOMY    . THORACOTOMY    . TONSILLECTOMY    . valvulopathy  06/13/06    Family History  Problem Relation Age of Onset  . Heart disease Mother   . Cancer Mother        breast  . Heart disease Maternal Aunt   . Cancer Maternal Aunt        uterine  . Heart disease Maternal Grandmother   . Diabetes Maternal Grandmother   . Hypercalcemia Neg Hx   . Colon cancer Neg Hx   . Esophageal cancer Neg Hx   . Liver disease Neg Hx   . Pancreatic cancer Neg Hx   .  Rectal cancer Neg Hx   . Stomach cancer Neg Hx     Medications- reviewed and updated Current Outpatient Medications  Medication Sig Dispense Refill  . aspirin 81 MG tablet Take 81 mg by mouth daily.      Marland Kitchen aspirin-acetaminophen-caffeine (EXCEDRIN MIGRAINE) 250-250-65 MG tablet Take 2 tablets by mouth every 6 (six) hours as needed for headache.    . diphenhydramine-acetaminophen (TYLENOL PM) 25-500 MG TABS tablet Take 1 tablet by mouth at bedtime as needed.    . docusate sodium (COLACE) 100 MG capsule Take 100 mg by mouth daily.     Marland Kitchen ezetimibe (ZETIA) 10 MG tablet Take 10 mg by mouth daily.     Marland Kitchen FERROUS SULFATE PO Take 325 mg by mouth 2 (two) times a day.     . furosemide (LASIX) 40 MG tablet TAKE 1 TABLET BY MOUTH  DAILY 90 tablet 1  . ibandronate (BONIVA) 150 MG tablet Take 1 tablet (150 mg total) by mouth every 30 (thirty) days. In AM w. full glass of h20 and do not take anything else by mouth for 60 mins 3 tablet 3  . Magnesium 250 MG TABS Take 1 tablet by mouth daily.    . metoprolol (TOPROL-XL) 50 MG 24 hr tablet Take 50 mg by mouth 2 (two) times daily.     .  Multiple Vitamin (MULTI-VITAMIN DAILY) TABS Take by mouth daily.    . potassium chloride SA (K-DUR,KLOR-CON) 20 MEQ tablet TAKE 1 TABLET BY MOUTH  DAILY 90 tablet 1  . spironolactone (ALDACTONE) 25 MG tablet Take 25 mg by mouth 2 (two) times daily.     Marland Kitchen warfarin (COUMADIN) 5 MG tablet Take 1 tablet daily except 1 1/2 tablets on Fridays or AS DIRECTED BY ANTICOAGULATION CLINIC 90 DAY (Patient taking differently: Take 1 tablet daily) 100 tablet 1  . levothyroxine (SYNTHROID) 112 MCG tablet TAKE 1 TABLET BY MOUTH  DAILY 90 tablet 3   No current facility-administered medications for this visit.     Allergies-reviewed and updated Allergies  Allergen Reactions  . Digoxin Other (See Comments)  . Statins     myalgia    Social History   Socioeconomic History  . Marital status: Married    Spouse name: Not on file  . Number of children: Not on file  . Years of education: Not on file  . Highest education level: Not on file  Occupational History  . Not on file  Social Needs  . Financial resource strain: Not on file  . Food insecurity:    Worry: Not on file    Inability: Not on file  . Transportation needs:    Medical: Not on file    Non-medical: Not on file  Tobacco Use  . Smoking status: Never Smoker  . Smokeless tobacco: Never Used  Substance and Sexual Activity  . Alcohol use: No  . Drug use: No  . Sexual activity: Not on file  Lifestyle  . Physical activity:    Days per week: Not on file    Minutes per session: Not on file  . Stress: Not on file  Relationships  . Social connections:    Talks on phone: Not on file    Gets together: Not on file    Attends religious service: Not on file    Active member of club or organization: Not on file    Attends meetings of clubs or organizations: Not on file    Relationship status: Not on file  Other Topics Concern  . Not on file  Social History Narrative   Lived in [place] for 18 years. Moved here for the weather.    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      Husband retired January 2015. Married 1991 (2nd marriage). No kids.       Hobbies: walks 2 miles per day, read, work outside           Objective:  BP (!) 115/56   Pulse 84   Temp 97.7 F (36.5 C)   Ht 5\' 6"  (1.676 m)   Wt 137 lb (62.1 kg)   BMI 22.11 kg/m  Gen: NAD, resting comfortably nonlabored breathing, normal respiratory rate Normal speech   Assessment/Plan:  AWV completed- discussed recommended screenings and documented any personalized health advice and referrals for preventive counseling. See AVS as well which was given to patient- actually will be mailed to patient  Status of chronic or acute concerns   History of hodgkins disease in 1970s- completed chemotherapy  CV issues followed by [facility] 1. History wolff parkinson white syndrome 2. History of aortic and mitral valve replacement mechanical- on long term coumadin followed at our clinic 3. atrial flutter-on coumadin for anticoagulation and metoprolol for rate control 4. Restrictive CHF- shortness of breath with stairs. Takes lasix 40mg  daily, spironolactone  25mg . No reported worsening edema or SOB. Potassium has interestingly required supplement despite spironolactone  Hypothyroidism- update tsh. On levothyroxine 112 mcg. Last tsh was stable- dont feel strongly about rechecking with covid 19  CKD III- stable in February with GFR in 30s She should avoid nsaids.   Osteoporosis. check vitamin D yearly. Her bone density was stable to improved in 2018- we agreed to repeat in October. Continue calcium, vitamin D, boniva for now ( Started boniva 201) May be able to stop boniva and repeat in 2 more years to guide our decision making  Hyperlipidemia S: poorly controlled on no statin in the past- improved control on zetia on last check.. Statins have caused muscle aches and zetia too expensive  previously. Flu like sypmtoms on statins as well Lab Results  Component Value Date   CHOL 229 (H) 10/28/2017   HDL 56.30 10/28/2017   LDLCALC 146 (H) 10/28/2017   LDLDIRECT 68.0 04/28/2018   TRIG 133.0 10/28/2017   CHOLHDL 4 10/28/2017   A/P: Controlled-continue current medication  # Back/hip/groin issues (see note last week)- may see Dr. [name] - trying to decide whether to keep appointment as symptoms have improved  Will drop off advanced directive  Future Appointments  Date Time Provider [place]  10/12/2018  2:15 PM Lyndal Pulley, DO [facility]  10/30/2018  8:00 AM [facility]   Lab/Order associations: Preventative health care  Return precautions advised. Garret Reddish, MD

## 2018-10-11 NOTE — Progress Notes (Signed)
[name] [place],  [address] Phone: [phone] Subjective:   Fontaine No, am serving as a scribe for Dr. Hulan Saas.  I'm seeing this patient by the request  of:  Grace Olp, [name]   CC: Left groin pain  [id number]  Grace [name] is a 68 y.o. female coming in with complaint of left groin pain for 2 months. Was doing yard work. Felt like she pulled a muscle in lower back. Does note that she was coughing in February and pulled a muscle in her left glute which resulted in a hematoma over the glute. Did walk for 2 weeks with a cane following the yardwork incident. Does note a vein that surfaced on the medial aspect of leg that ran up to the pubic symphysis. Was seen by Grace [name] for groin pain 09/28/2018. Xray taken: No significant abnormality noted. Venous doppler negative for DVT but 2.5 cm bakers cyst noted. Patient states that her pain has improved. Pain is located in groin and radiates into the coccyx. Pain is the same with sitting, lying or standing. 1/10 pain today. States that the bottoms of her feet appear bruised this morning. Wonders if she has a vascular issue.      Patient did have x-rays of the hip and evaluated 09/14/2018.  These were independently visualized by me.  Found to have minimal arthritic changes.  Patient also had Dopplers done.  Dopplers were unremarkable for Baker's cyst of the leftknee   Past Medical History:  Diagnosis Date  . Anemia   . Aortic insufficiency   . AORTIC VALVE REPLACEMENT, HX OF 04/27/2007   Qualifier: Diagnosis of  By: Grace [name], Grace [name]    . Cancer (HCC)    breast  . CHF (congestive heart failure) ([place])   . Congestive heart failure ([place]) 08/03/2009   [facility] Dr. Paul Half. Last echo 2010 with EF 30%. Restrictive due to radiation. AVR MVR. 02/2014 visit planned.    . GI bleed   . Hodgkin's disease   . Hyperlipidemia   . Hypothyroidism   . Migraine   . MITRAL VALVE REPLACEMENT, HX  OF 04/27/2007   Qualifier: Diagnosis of  By: Grace [name], Grace [name]    . WPW (Wolff-Parkinson-White syndrome)    Past Surgical History:  Procedure Laterality Date  . AORTIC VALVE SURGERY    . bilateral breast implants    . ENDOMETRIAL ABLATION    . MASTECTOMY    . MITRAL VALVE REPLACEMENT    . pericardial stripping  9/08  . SPLENECTOMY    . THORACOTOMY    . TONSILLECTOMY    . valvulopathy  06/13/06   Social History   Socioeconomic History  . Marital status: Married    Spouse name: Not on file  . Number of children: Not on file  . Years of education: Not on file  . Highest education level: Not on file  Occupational History  . Not on file  Social Needs  . Financial resource strain: Not on file  . Food insecurity:    Worry: Not on file    Inability: Not on file  . Transportation needs:    Medical: Not on file    Non-medical: Not on file  Tobacco Use  . Smoking status: Never Smoker  . Smokeless tobacco: Never Used  Substance and Sexual Activity  . Alcohol use: No  . Drug use: No  . Sexual activity: Not on file  Lifestyle  . Physical  activity:    Days per week: Not on file    Minutes per session: Not on file  . Stress: Not on file  Relationships  . Social connections:    Talks on phone: Not on file    Gets together: Not on file    Attends religious service: Not on file    Active member of club or organization: Not on file    Attends meetings of clubs or organizations: Not on file    Relationship status: Not on file  Other Topics Concern  . Not on file  Social History Narrative   Lived in [place] for 18 years. Moved here for the weather.    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      Husband retired January 2015. Married 1991 (2nd marriage). No kids.       Hobbies: walks 2 miles per day, read, work outside         Allergies  Allergen Reactions  . Digoxin Other (See Comments)  . Statins      myalgia   Family History  Problem Relation Age of Onset  . Heart disease Mother   . Cancer Mother        breast  . Heart disease Maternal Aunt   . Cancer Maternal Aunt        uterine  . Heart disease Maternal Grandmother   . Diabetes Maternal Grandmother   . Hypercalcemia Neg Hx   . Colon cancer Neg Hx   . Esophageal cancer Neg Hx   . Liver disease Neg Hx   . Pancreatic cancer Neg Hx   . Rectal cancer Neg Hx   . Stomach cancer Neg Hx     Current Outpatient Medications (Endocrine & Metabolic):  .  ibandronate (BONIVA) 150 MG tablet, Take 1 tablet (150 mg total) by mouth every 30 (thirty) days. In AM w. full glass of h20 and do not take anything else by mouth for 60 mins .  levothyroxine (SYNTHROID) 112 MCG tablet, TAKE 1 TABLET BY MOUTH  DAILY  Current Outpatient Medications (Cardiovascular):  .  ezetimibe (ZETIA) 10 MG tablet, Take 10 mg by mouth daily.  .  furosemide (LASIX) 40 MG tablet, TAKE 1 TABLET BY MOUTH  DAILY .  metoprolol (TOPROL-XL) 50 MG 24 hr tablet, Take 50 mg by mouth 2 (two) times daily.  Marland Kitchen  spironolactone (ALDACTONE) 25 MG tablet, Take 25 mg by mouth 2 (two) times daily.    Current Outpatient Medications (Analgesics):  .  aspirin 81 MG tablet, Take 81 mg by mouth daily.   Marland Kitchen  aspirin-acetaminophen-caffeine (EXCEDRIN MIGRAINE) 250-250-65 MG tablet, Take 2 tablets by mouth every 6 (six) hours as needed for headache.  Current Outpatient Medications (Hematological):  Marland Kitchen  FERROUS SULFATE PO, Take 325 mg by mouth 2 (two) times a day.  .  warfarin (COUMADIN) 5 MG tablet, Take 1 tablet daily except 1 1/2 tablets on Fridays or AS DIRECTED BY ANTICOAGULATION CLINIC 90 DAY (Patient taking differently: Take 1 tablet daily)  Current Outpatient Medications (Other):  .  diphenhydramine-acetaminophen (TYLENOL PM) 25-500 MG TABS tablet, Take 1 tablet by mouth at bedtime as needed. .  docusate sodium (COLACE) 100 MG capsule, Take 100 mg by mouth daily.  .  Magnesium 250 MG  TABS, Take 1 tablet by mouth daily. .  Multiple Vitamin (MULTI-VITAMIN DAILY) TABS, Take by mouth daily. .  potassium chloride SA (K-DUR,KLOR-CON) 20 MEQ tablet, TAKE  1 TABLET BY MOUTH  DAILY    Past medical history, social, surgical and family history all reviewed in electronic medical record.  No pertanent information unless stated regarding to the chief complaint.   Review of Systems:  No headache, visual changes, nausea, vomiting, diarrhea, constipation, dizziness, abdominal pain, skin rash, fevers, chills, night sweats, weight loss, swollen lymph nodes, body aches, joint swelling, chest pain, shortness of breath, mood changes.  Positive muscle aches  Objective  Blood pressure 122/78, pulse 69, height 5\' 6"  (1.676 m), weight 136 lb (61.7 kg), SpO2 99 %.    General: No apparent distress alert and oriented x3 mood and affect normal, dressed appropriately.  HEENT: Pupils equal, extraocular movements intact patient is wearing a mask Respiratory: Patient's speak in full sentences and does not appear short of breath  Cardiovascular: 1+ lower extremity edema, non tender, no erythema  Skin: Warm dry intact with no signs of infection or rash on extremities or on axial skeleton.  Abdomen: Soft nontender  Neuro: Cranial nerves II through XII are intact, neurovascularly intact in all extremities with 2+ DTRs and 2+ pulses.  Lymph: No lymphadenopathy of posterior or anterior cervical chain or axillae bilaterally.  Gait normal with good balance and coordination.  MSK:  Non tender with full range of motion and good stability and symmetric strength and tone of shoulders, elbows, wrist, knee and ankles bilaterally.  Mild arthritic changes of multiple joints.  Baker's cyst is palpated on the left popliteal region of the knee but otherwise knee very minimal arthritic changes with full range of motion. Hip: Left ROM IR: 25 Deg, ER: 45 Deg, Flexion: 120 Deg, Extension: 100 Deg, Abduction: 45 Deg,  Adduction: 25 Deg Strength 4+ out of 5 strength in all range of motion compared to the contralateral side Pelvic alignment unremarkable to inspection and palpation. Standing hip rotation and gait without trendelenburg sign / unsteadiness. Greater trochanter mild tenderness noted.  Worsening pain with Corky Sox test on the left compared to the contralateral side Severe tenderness over the left sacroiliac joint.  [id number]; 15 additional minutes spent for Therapeutic exercises as stated in above notes.  This included exercises focusing on stretching, strengthening, with significant focus on eccentric aspects.   Long term goals include an improvement in range of motion, strength, endurance as well as avoiding reinjury. Patient's frequency would include in 1-2 times a day, 3-5 times a week for a duration of 6-12 weeks. Sacroiliac Joint Mobilization and Rehab 1. Work on pretzel stretching, shoulder back and leg draped in front. 3-5 sets, 30 sec.. 2. hip abductor rotations. standing, hip flexion and rotation outward then inward. 3 sets, 15 reps. when can do comfortably, add ankle weights starting at 2 pounds.  3. cross over stretching - shoulder back to ground, same side leg crossover. 3-5 sets for 30 min..  4. rolling up and back knees to chest and rocking. 5. sacral tilt - 5 sets, hold for 5-10 seconds   Proper technique shown and discussed handout in great detail with ATC.  All questions were discussed and answered.   Impression and Recommendations:     This case required medical decision making of moderate complexity. The above documentation has been reviewed and is accurate and complete Lyndal Pulley, DO       Note: This dictation was prepared with Dragon dictation along with smaller phrase technology. Any transcriptional errors that result from this process are unintentional.

## 2018-10-12 ENCOUNTER — Other Ambulatory Visit: Payer: Self-pay

## 2018-10-12 ENCOUNTER — Encounter: Payer: Self-pay | Admitting: Family Medicine

## 2018-10-12 ENCOUNTER — Ambulatory Visit: Payer: Medicare Other | Admitting: Family Medicine

## 2018-10-12 VITALS — BP 122/78 | HR 69 | Ht 66.0 in | Wt 136.0 lb

## 2018-10-12 DIAGNOSIS — M810 Age-related osteoporosis without current pathological fracture: Secondary | ICD-10-CM | POA: Diagnosis not present

## 2018-10-12 DIAGNOSIS — D509 Iron deficiency anemia, unspecified: Secondary | ICD-10-CM | POA: Diagnosis not present

## 2018-10-12 DIAGNOSIS — M533 Sacrococcygeal disorders, not elsewhere classified: Secondary | ICD-10-CM | POA: Diagnosis not present

## 2018-10-12 DIAGNOSIS — M2550 Pain in unspecified joint: Secondary | ICD-10-CM | POA: Diagnosis not present

## 2018-10-12 DIAGNOSIS — M7122 Synovial cyst of popliteal space [Baker], left knee: Secondary | ICD-10-CM

## 2018-10-12 NOTE — Assessment & Plan Note (Signed)
Will monitor.  No intervention today.

## 2018-10-12 NOTE — Assessment & Plan Note (Signed)
Patient had more of a sacroiliac pain.  Concerned that patient did have a insufficiency fracture potentially.  Discussed with patient again to do with patient having hypercalcemia, low vitamin D, and multiple other comorbidities this could be contributing.  Added some labs to patient's next blood draw.  Encourage patient to continue Boniva but I do think that depending on vitamin D level we may need to consider prescription dose.  Home exercises given for range of motion exercises more muscle strengthening and stability.  Discussed with more of the groin injury all fine compression could be beneficial.  Continue to be active otherwise.  Follow-up again 4 weeks

## 2018-10-12 NOTE — Assessment & Plan Note (Signed)
Rechecking vitamin D

## 2018-10-12 NOTE — Patient Instructions (Addendum)
Good to see you  You might have injured your SI joint  Ice is your friend Ice 20 minutes 2 times daily. Usually after activity and before bed. pennsaid pinkie amount topically 2 times daily as needed.  Stay active Thigh compression sleeve with a lot of activity daily  We will get labs next week and see  See me again in 4 weeks

## 2018-10-16 ENCOUNTER — Other Ambulatory Visit (INDEPENDENT_AMBULATORY_CARE_PROVIDER_SITE_OTHER): Payer: Medicare Other

## 2018-10-16 DIAGNOSIS — M2550 Pain in unspecified joint: Secondary | ICD-10-CM | POA: Diagnosis not present

## 2018-10-16 DIAGNOSIS — D509 Iron deficiency anemia, unspecified: Secondary | ICD-10-CM | POA: Diagnosis not present

## 2018-10-16 LAB — CBC WITH DIFFERENTIAL/PLATELET
Basophils Absolute: 0.1 10*3/uL (ref 0.0–0.1)
Basophils Relative: 1.1 % (ref 0.0–3.0)
Eosinophils Absolute: 0.3 10*3/uL (ref 0.0–0.7)
Eosinophils Relative: 3.9 % (ref 0.0–5.0)
HCT: 37.1 % (ref 36.0–46.0)
Hemoglobin: 12.3 g/dL (ref 12.0–15.0)
Lymphocytes Relative: 10.5 % — ABNORMAL LOW (ref 12.0–46.0)
Lymphs Abs: 0.9 10*3/uL (ref 0.7–4.0)
MCHC: 33.1 g/dL (ref 30.0–36.0)
MCV: 92 fl (ref 78.0–100.0)
Monocytes Absolute: 1.3 10*3/uL — ABNORMAL HIGH (ref 0.1–1.0)
Monocytes Relative: 15.6 % — ABNORMAL HIGH (ref 3.0–12.0)
Neutro Abs: 5.8 10*3/uL (ref 1.4–7.7)
Neutrophils Relative %: 68.9 % (ref 43.0–77.0)
Platelets: 424 10*3/uL — ABNORMAL HIGH (ref 150.0–400.0)
RBC: 4.03 Mil/uL (ref 3.87–5.11)
RDW: 15.4 % (ref 11.5–15.5)
WBC: 8.4 10*3/uL (ref 4.0–10.5)

## 2018-10-16 LAB — IBC + FERRITIN
Ferritin: 52.2 ng/mL (ref 10.0–291.0)
Iron: 33 ug/dL — ABNORMAL LOW (ref 42–145)
Saturation Ratios: 7.9 % — ABNORMAL LOW (ref 20.0–50.0)
Transferrin: 299 mg/dL (ref 212.0–360.0)

## 2018-10-16 LAB — TSH: TSH: 0.55 u[IU]/mL (ref 0.35–4.50)

## 2018-10-16 LAB — SEDIMENTATION RATE: Sed Rate: 24 mm/hr (ref 0–30)

## 2018-10-16 LAB — VITAMIN D 25 HYDROXY (VIT D DEFICIENCY, FRACTURES): VITD: 53.58 ng/mL (ref 30.00–100.00)

## 2018-10-16 LAB — URIC ACID: Uric Acid, Serum: 7.4 mg/dL — ABNORMAL HIGH (ref 2.4–7.0)

## 2018-10-20 LAB — PTH, INTACT AND CALCIUM
Calcium: 10.2 mg/dL (ref 8.6–10.4)
PTH: 25 pg/mL (ref 14–64)

## 2018-10-27 ENCOUNTER — Other Ambulatory Visit: Payer: Self-pay

## 2018-10-27 ENCOUNTER — Ambulatory Visit (INDEPENDENT_AMBULATORY_CARE_PROVIDER_SITE_OTHER): Payer: Medicare Other | Admitting: General Practice

## 2018-10-27 DIAGNOSIS — Z952 Presence of prosthetic heart valve: Secondary | ICD-10-CM | POA: Diagnosis not present

## 2018-10-27 DIAGNOSIS — Z7901 Long term (current) use of anticoagulants: Secondary | ICD-10-CM

## 2018-10-27 DIAGNOSIS — D509 Iron deficiency anemia, unspecified: Secondary | ICD-10-CM

## 2018-10-27 LAB — POCT INR: INR: 5.5 — AB (ref 2.0–3.0)

## 2018-10-27 NOTE — Patient Instructions (Signed)
Pre visit review using our clinic review tool, if applicable. No additional management support is needed unless otherwise documented below in the visit note. 

## 2018-10-29 ENCOUNTER — Telehealth: Payer: Self-pay

## 2018-10-29 NOTE — Telephone Encounter (Signed)
Thank you for update. At her convenience. GM

## 2018-10-29 NOTE — Telephone Encounter (Addendum)
Dr Rush Landmark I spoke with pt today to get her scheduled for Colon;/Endo, however her coumadin levels have been elevated. She would like to wait until after her levels have normalized before proceeding with procedures as she will also have to bridge with Lovenox. She goes for repeat PT/INR in 2 weeks. She would like for me to give her a call back at that time.

## 2018-10-30 ENCOUNTER — Ambulatory Visit: Payer: Medicare Other

## 2018-11-02 ENCOUNTER — Other Ambulatory Visit: Payer: Self-pay

## 2018-11-02 ENCOUNTER — Ambulatory Visit: Payer: Medicare Other | Admitting: Family Medicine

## 2018-11-02 ENCOUNTER — Ambulatory Visit (INDEPENDENT_AMBULATORY_CARE_PROVIDER_SITE_OTHER): Payer: Medicare Other

## 2018-11-02 ENCOUNTER — Encounter: Payer: Self-pay | Admitting: Family Medicine

## 2018-11-02 VITALS — BP 120/78 | HR 87 | Temp 98.1°F | Ht 66.0 in | Wt 133.8 lb

## 2018-11-02 DIAGNOSIS — M546 Pain in thoracic spine: Secondary | ICD-10-CM | POA: Diagnosis not present

## 2018-11-02 NOTE — Progress Notes (Signed)
Grace [name] - 68 y.o. female MRN [id number]  Date of birth: Dec 31, 1950  SUBJECTIVE:  Including CC & ROS.  Chief Complaint  Patient presents with  . Follow-up    Back pain   I, [name] LAT, ATC, have served as scribe for Dr. Clearance Coots.  Grace [name] is a 68 y.o. female that is presenting w/ c/o back pain.  Pt last saw Dr. [name] on 10/12/18 for L groin pain.  Pt was ill in Feb and went to see him.  Pt states that she was coughing and experienced pain and swelling in her L lower back.  Pt went to see Dr. [name], her PCP, and was essentially told to wait and see.  Her symptoms improved but then flared up again after pulling weeds in March.  She started having back and L groin and L inner thigh pain.  She had a virtual visit w/ Dr. [name] and was referred to Dr. [name].  More recently, she picked up a kitchen chair early last week, and rotated w/ the chair.  She is now having pain in her t-spine and feels like a belt is being tightened around her chest. She rates her current pain as a sharp 10/10 at it's worst.  Pt is having pain w/ breathing.  She rates her pain currently as an 8/10 that is constant in nature.  She reports having spasms in her back and denies any N/T into her B LEs.  Aggravating factors: sitting  Alleviating factors: laying supine  Treatments tried: Ice, heat, Pennsaid  Imaging: L hip XR on 09/28/18   Review of Systems  Constitutional: Negative for fever.  HENT: Negative for congestion.   Respiratory: Negative for cough.   Cardiovascular: Negative for chest pain.  Gastrointestinal: Negative for abdominal pain.  Musculoskeletal: Positive for arthralgias and back pain.  Skin: Positive for color change.  Neurological: Positive for weakness.  Hematological: Positive for adenopathy.    HISTORY: Past Medical, Surgical, Social, and Family History Reviewed & Updated per EMR.   Pertinent Historical Findings include:  Past Medical History:  Diagnosis Date  .  Anemia   . Aortic insufficiency   . AORTIC VALVE REPLACEMENT, HX OF 04/27/2007   Qualifier: Diagnosis of  By: Leanne Chang MD, Bruce    . Cancer (HCC)    breast  . CHF (congestive heart failure) ([place])   . Congestive heart failure ([place]) 08/03/2009   [facility] Dr. Paul Half. Last echo 2010 with EF 30%. Restrictive due to radiation. AVR MVR. 02/2014 visit planned.    . GI bleed   . Hodgkin's disease   . Hyperlipidemia   . Hypothyroidism   . Migraine   . MITRAL VALVE REPLACEMENT, HX OF 04/27/2007   Qualifier: Diagnosis of  By: Leanne Chang MD, Bruce    . WPW (Wolff-Parkinson-White syndrome)     Past Surgical History:  Procedure Laterality Date  . AORTIC VALVE SURGERY    . bilateral breast implants    . ENDOMETRIAL ABLATION    . MASTECTOMY    . MITRAL VALVE REPLACEMENT    . pericardial stripping  9/08  . SPLENECTOMY    . THORACOTOMY    . TONSILLECTOMY    . valvulopathy  06/13/06    Allergies  Allergen Reactions  . Digoxin Other (See Comments)  . Statins     myalgia    Family History  Problem Relation Age of Onset  . Heart disease Mother   . Cancer Mother  breast  . Heart disease Maternal Aunt   . Cancer Maternal Aunt        uterine  . Heart disease Maternal Grandmother   . Diabetes Maternal Grandmother   . Hypercalcemia Neg Hx   . Colon cancer Neg Hx   . Esophageal cancer Neg Hx   . Liver disease Neg Hx   . Pancreatic cancer Neg Hx   . Rectal cancer Neg Hx   . Stomach cancer Neg Hx      Social History   Socioeconomic History  . Marital status: Married    Spouse name: Not on file  . Number of children: Not on file  . Years of education: Not on file  . Highest education level: Not on file  Occupational History  . Not on file  Social Needs  . Financial resource strain: Not on file  . Food insecurity:    Worry: Not on file    Inability: Not on file  . Transportation needs:    Medical: Not on file    Non-medical: Not on file  Tobacco Use  . Smoking status: Never  Smoker  . Smokeless tobacco: Never Used  Substance and Sexual Activity  . Alcohol use: No  . Drug use: No  . Sexual activity: Not on file  Lifestyle  . Physical activity:    Days per week: Not on file    Minutes per session: Not on file  . Stress: Not on file  Relationships  . Social connections:    Talks on phone: Not on file    Gets together: Not on file    Attends religious service: Not on file    Active member of club or organization: Not on file    Attends meetings of clubs or organizations: Not on file    Relationship status: Not on file  . Intimate partner violence:    Fear of current or ex partner: Not on file    Emotionally abused: Not on file    Physically abused: Not on file    Forced sexual activity: Not on file  Other Topics Concern  . Not on file  Social History Narrative   Lived in [place] for 18 years. Moved here for the weather.    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      Husband retired January 2015. Married 1991 (2nd marriage). No kids.       Hobbies: walks 2 miles per day, read, work outside           [place]:  VS: BP 120/78 (BP Location: Left Arm, Patient Position: Sitting, Cuff Size: Normal)   Pulse 87   Temp 98.1 F (36.7 C)   Ht 5\' 6"  (1.676 m)   Wt 133 lb 12.8 oz (60.7 kg)   SpO2 96%   BMI 21.60 kg/m  Physical Exam Gen: NAD, alert, cooperative with exam, well-appearing ENT: normal lips, normal nasal mucosa,  Eye: normal EOM, normal conjunctiva and lids CV:  no edema, +2 pedal pulses   Resp: no accessory muscle use, non-labored,  Skin: no rashes, no areas of induration  Neuro: normal tone, normal sensation to touch Psych:  normal insight, alert and oriented MSK:  Back:  Tenderness to palpation of the midline thoracic spine. Slight scoliosis present with the right scapula being more prominent than left.  No winging of the scapula. Normal shoulder range of  motion. No tenderness to palpation of the paraspinal  muscles. No tenderness palpation over the left or right ribs in the midaxillary space. Neurovascular intact.     ASSESSMENT & PLAN:   Acute midline thoracic back pain Back pain with a week duration that has significantly gotten worse.  Is occurring in the midline and concern with compression fracture -X-ray. -Counseled on supportive care.  The above documentation has been reviewed and is accurate and complete. Clearance Coots, MD 11/03/2018, 1:30 PM>

## 2018-11-02 NOTE — Patient Instructions (Signed)
Nice to meet you I will call you with the xray results from today  Please try the exercises   Please send me a message in MyChart with any questions or updates.  Please see me back in as needed.   --Dr. Raeford Razor

## 2018-11-03 ENCOUNTER — Telehealth: Payer: Self-pay | Admitting: Family Medicine

## 2018-11-03 DIAGNOSIS — M546 Pain in thoracic spine: Secondary | ICD-10-CM | POA: Insufficient documentation

## 2018-11-03 MED ORDER — CALCITONIN (SALMON) 200 UNIT/ACT NA SOLN: 1.0000 | Freq: Every day | NASAL | 0 refills | Status: DC

## 2018-11-03 MED ORDER — TRAMADOL HCL 50 MG PO TABS: 50.0000 mg | ORAL_TABLET | Freq: Three times a day (TID) | ORAL | 0 refills | Status: DC | PRN

## 2018-11-03 NOTE — Telephone Encounter (Signed)
Spoke with patient about the results.  It appears that she has 2 new compression fractures.  She is currently on Boniva for bone health.  Vitamin D was last checked to be normal.  Is on warfarin so avoiding anti-inflammatories for pain.  Advised to take Tylenol. Will provided Calcitonin and tramadol. May need to provide further pain management it no controlled.   Rosemarie Ax, MD [facility] 11/03/2018, 1:39 PM

## 2018-11-03 NOTE — Assessment & Plan Note (Signed)
Back pain with a week duration that has significantly gotten worse.  Is occurring in the midline and concern with compression fracture -X-ray. -Counseled on supportive care.

## 2018-11-03 NOTE — Telephone Encounter (Signed)
Copied from [place] [phone] [id number]. Topic: General - Inquiry >> Nov 03, 2018 12:37 PM Margot Ables wrote: Reason for CRM: pt calling to f/u on results of back pain. Pt was not able to get into Dr. [name] at [facility]. She came to Dr. Raeford Razor yesterday to be seen so that she didn't have to wait. Pt was advised Dr. Raeford Razor is out of office this week. She said she is in even more pain. Pt requesting another MD to review and call her back today with results. Call pt Cell # please [phone]. She said ok to talk to her or her husband, Darnell Level.

## 2018-11-04 ENCOUNTER — Telehealth: Payer: Self-pay | Admitting: Gastroenterology

## 2018-11-04 NOTE — Telephone Encounter (Signed)
Pt called and states she just found out yesterday that she has 2 compression fractures at T6 and T7. Also states that her last INR was 5.5 and they do not know why it was so elevated. Pt is scheduled for 2 feraheme infusions and wants to know if she can cancel those and just have repeat labs now. She just doesn't think she can handle the infusions at this time just has to much going on right now. Please advise.

## 2018-11-04 NOTE — Telephone Encounter (Signed)
Patient would like to speak to someone about her new health issues.

## 2018-11-04 NOTE — Telephone Encounter (Signed)
Thank you Vaughan Basta for the update. I am sorry to hear about the fractures. I wouldn't redraw her labs until it has been at least 4-6 weeks since her last set of CBC/Iron/TIBC/Ferritin. I would rather she try and get at least one of the IV Iron infusions, but it is her call. Plan to order the labs, and she may cancel the IV Iron infusion for now. However, she needs to be understanding that if she remains Iron deficient, which I believe she likely will be, she would significantly benefit from the IV Iron infusion. Set her up for a followup with me 1-2 weeks after her Labs are completed so we can talk about where things are and plan procedures when she feels ready. Thanks. GM

## 2018-11-05 ENCOUNTER — Encounter: Payer: Self-pay | Admitting: Family Medicine

## 2018-11-05 ENCOUNTER — Telehealth: Payer: Self-pay | Admitting: Family Medicine

## 2018-11-05 ENCOUNTER — Other Ambulatory Visit: Payer: Self-pay

## 2018-11-05 DIAGNOSIS — D509 Iron deficiency anemia, unspecified: Secondary | ICD-10-CM

## 2018-11-05 DIAGNOSIS — S22000A Wedge compression fracture of unspecified thoracic vertebra, initial encounter for closed fracture: Secondary | ICD-10-CM

## 2018-11-05 NOTE — Telephone Encounter (Signed)
Pt aware, would like to just have labs and OV. Feraheme appts cancelled. Lab orders in epic, pt knows to have labs done around 6/19, OV scheduled with Dr. Jannifer Rodney 11/20/18@11AM , pt aware of appt.

## 2018-11-05 NOTE — Telephone Encounter (Signed)
Spoke with patient about referral to endo for bone health and compression fractures.   Rosemarie Ax, MD [facility] 11/05/2018, 8:57 AM

## 2018-11-05 NOTE — Telephone Encounter (Signed)
Left message for pt to call back  °

## 2018-11-05 NOTE — Telephone Encounter (Signed)
Pt returning phone call ° °

## 2018-11-09 ENCOUNTER — Encounter: Payer: Self-pay | Admitting: Family Medicine

## 2018-11-09 ENCOUNTER — Ambulatory Visit: Payer: Medicare Other | Admitting: Family Medicine

## 2018-11-09 ENCOUNTER — Other Ambulatory Visit: Payer: Self-pay

## 2018-11-09 VITALS — BP 132/70 | HR 92 | Ht 66.0 in | Wt 132.0 lb

## 2018-11-09 DIAGNOSIS — M546 Pain in thoracic spine: Secondary | ICD-10-CM

## 2018-11-09 DIAGNOSIS — M4854XA Collapsed vertebra, not elsewhere classified, thoracic region, initial encounter for fracture: Secondary | ICD-10-CM

## 2018-11-09 DIAGNOSIS — M2550 Pain in unspecified joint: Secondary | ICD-10-CM | POA: Diagnosis not present

## 2018-11-09 NOTE — Patient Instructions (Addendum)
Good to see you.  Take tramadol with tylenol as needed Labs today  See me again in 2-3 weeks

## 2018-11-09 NOTE — Assessment & Plan Note (Signed)
Patient has a more of a compression fracture.  We discussed which activities to do which wants to avoid.  Discussed with calcitonin the potential side effects.  Discussed hypercalcemia.  Would like laboratory work-up to further evaluate to see if there is any reason.  Follow-up again in 4 to 8 weeks

## 2018-11-09 NOTE — Progress Notes (Signed)
[name] [address], [place] [address] Phone: [phone] Subjective:   I Kandace Blitz am serving as a Education administrator for Dr. Hulan Saas.  I'm seeing this patient by the request  of:    CC: Back pain follow-up  JIR:[id number]  Grace [name] is a 68 y.o. female coming in with complaint of back pain. Fracture T6-7. States she is not feeling well. 2 weeks ago she pulled something in her hip. Remembers picking something up. Lift and turn/twist motion. Has been taking tramadol. Took the tramadol before bed and woke up with her wisdom tooth bleeding. Has discontinued since Wednesday. She is taking blood thinners. Has also prescribed nasal spray and hasn't taken it because she is nervous about being on blood thinners. Ice and heating packs help.       Past Medical History:  Diagnosis Date  . Anemia   . Aortic insufficiency   . AORTIC VALVE REPLACEMENT, HX OF 04/27/2007   Qualifier: Diagnosis of  By: Leanne Chang MD, Bruce    . Cancer (HCC)    breast  . CHF (congestive heart failure) ([place])   . Congestive heart failure ([place]) 08/03/2009   [facility]. Paul Half. Last echo 2010 with EF 30%. Restrictive due to radiation. AVR MVR. 02/2014 visit planned.    . GI bleed   . Hodgkin's disease   . Hyperlipidemia   . Hypothyroidism   . Migraine   . MITRAL VALVE REPLACEMENT, HX OF 04/27/2007   Qualifier: Diagnosis of  By: Leanne Chang MD, Bruce    . WPW (Wolff-Parkinson-White syndrome)    Past Surgical History:  Procedure Laterality Date  . AORTIC VALVE SURGERY    . bilateral breast implants    . ENDOMETRIAL ABLATION    . MASTECTOMY    . MITRAL VALVE REPLACEMENT    . pericardial stripping  9/08  . SPLENECTOMY    . THORACOTOMY    . TONSILLECTOMY    . valvulopathy  06/13/06   Social History   Socioeconomic History  . Marital status: Married    Spouse name: Not on file  . Number of children: Not on file  . Years of education: Not on file  . Highest education  level: Not on file  Occupational History  . Not on file  Social Needs  . Financial resource strain: Not on file  . Food insecurity    Worry: Not on file    Inability: Not on file  . Transportation needs    Medical: Not on file    Non-medical: Not on file  Tobacco Use  . Smoking status: Never Smoker  . Smokeless tobacco: Never Used  Substance and Sexual Activity  . Alcohol use: No  . Drug use: No  . Sexual activity: Not on file  Lifestyle  . Physical activity    Days per week: Not on file    Minutes per session: Not on file  . Stress: Not on file  Relationships  . Social Herbalist on phone: Not on file    Gets together: Not on file    Attends religious service: Not on file    Active member of club or organization: Not on file    Attends meetings of clubs or organizations: Not on file    Relationship status: Not on file  Other Topics Concern  . Not on file  Social History Narrative   Lived in [place] for 18 years. Moved here for the weather.  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      Husband retired January 2015. Married 1991 (2nd marriage). No kids.       Hobbies: walks 2 miles per day, read, work outside         Allergies  Allergen Reactions  . Digoxin Other (See Comments)  . Statins     myalgia   Family History  Problem Relation Age of Onset  . Heart disease Mother   . Cancer Mother        breast  . Heart disease Maternal Aunt   . Cancer Maternal Aunt        uterine  . Heart disease Maternal Grandmother   . Diabetes Maternal Grandmother   . Hypercalcemia Neg Hx   . Colon cancer Neg Hx   . Esophageal cancer Neg Hx   . Liver disease Neg Hx   . Pancreatic cancer Neg Hx   . Rectal cancer Neg Hx   . Stomach cancer Neg Hx     Current Outpatient Medications (Endocrine & Metabolic):  .  calcitonin, salmon, (MIACALCIN) 200 UNIT/ACT nasal spray, Place 1 spray into alternate  nostrils daily. Use for maximum duration of 2-4 weeks. .  ibandronate (BONIVA) 150 MG tablet, Take 1 tablet (150 mg total) by mouth every 30 (thirty) days. In AM w. full glass of h20 and do not take anything else by mouth for 60 mins .  levothyroxine (SYNTHROID) 112 MCG tablet, TAKE 1 TABLET BY MOUTH  DAILY  Current Outpatient Medications (Cardiovascular):  .  ezetimibe (ZETIA) 10 MG tablet, Take 10 mg by mouth daily.  .  furosemide (LASIX) 40 MG tablet, TAKE 1 TABLET BY MOUTH  DAILY .  metoprolol (TOPROL-XL) 50 MG 24 hr tablet, Take 50 mg by mouth 2 (two) times daily.  Marland Kitchen  spironolactone (ALDACTONE) 25 MG tablet, Take 25 mg by mouth 2 (two) times daily.    Current Outpatient Medications (Analgesics):  .  aspirin 81 MG tablet, Take 81 mg by mouth daily.   Marland Kitchen  aspirin-acetaminophen-caffeine (EXCEDRIN MIGRAINE) 250-250-65 MG tablet, Take 2 tablets by mouth every 6 (six) hours as needed for headache. .  traMADol (ULTRAM) 50 MG tablet, Take 1 tablet (50 mg total) by mouth every 8 (eight) hours as needed for up to 7 days.  Current Outpatient Medications (Hematological):  Marland Kitchen  FERROUS SULFATE PO, Take 325 mg by mouth 2 (two) times a day.  .  warfarin (COUMADIN) 5 MG tablet, Take 1 tablet daily except 1 1/2 tablets on Fridays or AS DIRECTED BY ANTICOAGULATION CLINIC 90 DAY (Patient taking differently: Take 1 tablet daily)  Current Outpatient Medications (Other):  .  diphenhydramine-acetaminophen (TYLENOL PM) 25-500 MG TABS tablet, Take 1 tablet by mouth at bedtime as needed. .  docusate sodium (COLACE) 100 MG capsule, Take 100 mg by mouth daily.  .  Magnesium 250 MG TABS, Take 1 tablet by mouth daily. .  Multiple Vitamin (MULTI-VITAMIN DAILY) TABS, Take by mouth daily. .  potassium chloride SA (K-DUR,KLOR-CON) 20 MEQ tablet, TAKE 1 TABLET BY MOUTH  DAILY    Past medical history, social, surgical and family history all reviewed in electronic medical record.  No pertanent information unless stated  regarding to the chief complaint.   Review of Systems:  No headache, visual changes, nausea, vomiting, diarrhea, constipation, dizziness, abdominal pain, skin rash, fevers, chills, night sweats, weight loss, swollen lymph nodes, body aches, joint swelling,  chest pain,  shortness of breath, mood changes.  Positive muscle aches  Objective  Blood pressure 132/70, pulse 92, height 5\' 6"  (1.676 m), weight 132 lb (59.9 kg), SpO2 95 %.    General: No apparent distress alert and oriented x3 mood and affect normal, dressed appropriately.  Seems uncomfortable HEENT: Pupils equal, extraocular movements intact  Respiratory: Patient's speak in full sentences and does not appear short of breath  Cardiovascular: No lower extremity edema, non tender, no erythema  Skin: Warm dry intact with no signs of infection or rash on extremities or on axial skeleton.  Abdomen: Soft nontender  Neuro: Cranial nerves II through XII are intact, neurovascularly intact in all extremities with 2+ DTRs and 2+ pulses.  Lymph: No lymphadenopathy of posterior or anterior cervical chain or axillae bilaterally.  Gait normal with good balance and coordination.  MSK:  tender with mild limited range of motion and good stability and symmetric strength and tone of shoulders, elbows, wrist, hip, knee and ankles bilaterally.  Arthritic changes of multiple joints Thoracic spine has some mild increasing kyphosis.  Patient does have tenderness to palpation around the spinous process tenderness in the T6-T8 areas.  Also has pain in the paraspinal musculature.    Impression and Recommendations:     This case required medical decision making of moderate complexity. The above documentation has been reviewed and is accurate and complete Lyndal Pulley, DO       Note: This dictation was prepared with Dragon dictation along with smaller phrase technology. Any transcriptional errors that result from this process are unintentional.

## 2018-11-10 ENCOUNTER — Ambulatory Visit (INDEPENDENT_AMBULATORY_CARE_PROVIDER_SITE_OTHER): Payer: Medicare Other | Admitting: General Practice

## 2018-11-10 ENCOUNTER — Other Ambulatory Visit: Payer: Self-pay

## 2018-11-10 ENCOUNTER — Other Ambulatory Visit (INDEPENDENT_AMBULATORY_CARE_PROVIDER_SITE_OTHER): Payer: Medicare Other

## 2018-11-10 DIAGNOSIS — Z952 Presence of prosthetic heart valve: Secondary | ICD-10-CM

## 2018-11-10 DIAGNOSIS — M2550 Pain in unspecified joint: Secondary | ICD-10-CM

## 2018-11-10 DIAGNOSIS — Z7901 Long term (current) use of anticoagulants: Secondary | ICD-10-CM

## 2018-11-10 LAB — C-REACTIVE PROTEIN: CRP: 1.1 mg/dL (ref 0.5–20.0)

## 2018-11-10 LAB — SEDIMENTATION RATE: Sed Rate: 46 mm/hr — ABNORMAL HIGH (ref 0–30)

## 2018-11-10 LAB — POCT INR: INR: 2.9 (ref 2.0–3.0)

## 2018-11-10 NOTE — Patient Instructions (Addendum)
Pre visit review using our clinic review tool, if applicable. No additional management support is needed unless otherwise documented below in the visit note.  Continue to take 1 tablet daily except 1/2 tablet on Fridays.  Re-check in 3 weeks.  

## 2018-11-11 ENCOUNTER — Encounter (HOSPITAL_COMMUNITY): Payer: Medicare Other

## 2018-11-12 LAB — PROTEIN ELECTROPHORESIS, SERUM, WITH REFLEX
Albumin ELP: 4.4 g/dL (ref 3.8–4.8)
Alpha 1: 0.5 g/dL — ABNORMAL HIGH (ref 0.2–0.3)
Alpha 2: 0.8 g/dL (ref 0.5–0.9)
Beta 2: 0.5 g/dL (ref 0.2–0.5)
Beta Globulin: 0.6 g/dL (ref 0.4–0.6)
Gamma Globulin: 1.1 g/dL (ref 0.8–1.7)
Total Protein: 8 g/dL (ref 6.1–8.1)

## 2018-11-12 LAB — PTH, INTACT AND CALCIUM
Calcium: 10.5 mg/dL — ABNORMAL HIGH (ref 8.6–10.4)
PTH: 37 pg/mL (ref 14–64)

## 2018-11-12 LAB — CALCIUM, IONIZED: Calcium, Ion: 5.29 mg/dL (ref 4.8–5.6)

## 2018-11-13 ENCOUNTER — Telehealth: Payer: Self-pay | Admitting: Physical Therapy

## 2018-11-13 ENCOUNTER — Other Ambulatory Visit (INDEPENDENT_AMBULATORY_CARE_PROVIDER_SITE_OTHER): Payer: Medicare Other

## 2018-11-13 DIAGNOSIS — D509 Iron deficiency anemia, unspecified: Secondary | ICD-10-CM | POA: Diagnosis not present

## 2018-11-13 LAB — CBC WITH DIFFERENTIAL/PLATELET
Basophils Absolute: 0.1 10*3/uL (ref 0.0–0.1)
Basophils Relative: 1.3 % (ref 0.0–3.0)
Eosinophils Absolute: 0.2 10*3/uL (ref 0.0–0.7)
Eosinophils Relative: 1.8 % (ref 0.0–5.0)
HCT: 37 % (ref 36.0–46.0)
Hemoglobin: 12.1 g/dL (ref 12.0–15.0)
Lymphocytes Relative: 9.1 % — ABNORMAL LOW (ref 12.0–46.0)
Lymphs Abs: 0.8 10*3/uL (ref 0.7–4.0)
MCHC: 32.8 g/dL (ref 30.0–36.0)
MCV: 92 fl (ref 78.0–100.0)
Monocytes Absolute: 1.4 10*3/uL — ABNORMAL HIGH (ref 0.1–1.0)
Monocytes Relative: 16.1 % — ABNORMAL HIGH (ref 3.0–12.0)
Neutro Abs: 6.2 10*3/uL (ref 1.4–7.7)
Neutrophils Relative %: 71.7 % (ref 43.0–77.0)
Platelets: 559 10*3/uL — ABNORMAL HIGH (ref 150.0–400.0)
RBC: 4.02 Mil/uL (ref 3.87–5.11)
RDW: 14.8 % (ref 11.5–15.5)
WBC: 8.7 10*3/uL (ref 4.0–10.5)

## 2018-11-13 LAB — IRON: Iron: 62 ug/dL (ref 42–145)

## 2018-11-13 LAB — FERRITIN: Ferritin: 76.1 ng/mL (ref 10.0–291.0)

## 2018-11-13 NOTE — Telephone Encounter (Signed)
Copied from [place] [id number]. Topic: General - Inquiry >> Nov 13, 2018 10:10 AM Mathis Bud wrote: Reason for CRM: patient is requesting a call back regarding medical records, she would like to discuss with her old MR that she first came to practice with. Call back # [phone] (home) [phone] (cell)

## 2018-11-13 NOTE — Telephone Encounter (Signed)
Called pt, she is looking for old medical records that she brought with her from [place] when she established care with Dr. Leanne Chang. I provided pt with number to medical records dept she will contact them regarding medical records.

## 2018-11-14 ENCOUNTER — Encounter: Payer: Self-pay | Admitting: Family Medicine

## 2018-11-14 LAB — IRON, TOTAL/TOTAL IRON BINDING CAP
%SAT: 16 % (calc) (ref 16–45)
Iron: 63 ug/dL (ref 45–160)
TIBC: 391 mcg/dL (calc) (ref 250–450)

## 2018-11-17 ENCOUNTER — Telehealth: Payer: Self-pay | Admitting: *Deleted

## 2018-11-17 ENCOUNTER — Other Ambulatory Visit: Payer: Self-pay

## 2018-11-17 ENCOUNTER — Other Ambulatory Visit: Payer: Self-pay | Admitting: Family Medicine

## 2018-11-17 DIAGNOSIS — M546 Pain in thoracic spine: Secondary | ICD-10-CM

## 2018-11-17 NOTE — Telephone Encounter (Signed)
I refilled today . Will be short term.  Have compression fracture and not well controlled.

## 2018-11-17 NOTE — Telephone Encounter (Signed)
I am sorry she is hurting.  This can take some time.  Calcitonin nose spray could help  Take the tramadol with a tylenol  This will take some time to heal.

## 2018-11-17 NOTE — Telephone Encounter (Signed)
-----   Message from Jacqualin Combes sent at 11/17/2018  4:15 PM EDT ----- Regarding: Tramadol refill Is looking for another refill on her tramadol. Please advise.

## 2018-11-17 NOTE — Telephone Encounter (Signed)
Pt called stating she is having a lot of pain in her back & would like any recommendations as to what to do. Her next appt with you is 11/26/2018.

## 2018-11-18 ENCOUNTER — Telehealth: Payer: Self-pay

## 2018-11-18 ENCOUNTER — Encounter (HOSPITAL_COMMUNITY): Payer: Medicare Other

## 2018-11-18 NOTE — Telephone Encounter (Signed)
Please see MyChart message below that was sent from pt on 6/20:    Labs from Dr [name] 6/15 visit considered within normal & [facility] labs 6/19 considered stable; IV iron & procedures on hold for now, will discuss w him at 6/26 appt.  Would like to put seeing Dr. Renne Crigler on hold for now until get compression fractures issue resolved.  Still having great amount pain &  spasms, so would like to see scoliosis & spinal deformity surgeon about Kyphoplasty surgery, know would have to come off Warfarin for procedure.  Could you refer me to best in the area?  Also talked w CNA Brandy yesterday,  trying to obtain my med records I brought w  me when moved here from from [facility] in 1997 & delivered to Dr Leanne Chang when he was at [place] office.  Med records didn't know, but checking to see if paper record exist in storage.  Reason being, found online Dr. Ernst Breach at [facility] who specializes in long term survivors of child & young adult cancer w complex needs.  Didn't know any specialist like that existed, called his office, so would need records. Thx  Pls advise

## 2018-11-18 NOTE — Telephone Encounter (Signed)
Copied from [place] [phone]. Topic: General - Other >> Nov 18, 2018  1:05 PM Yvette Rack wrote: Reason for CRM: Pt stated she sent Dr. [name] a [facility] message on Saturday but she has not received a response. Pt requests a call back regarding her [facility] message or either a reply on [facility].

## 2018-11-18 NOTE — Telephone Encounter (Signed)
Grace [name] discussed with pt via mychart.

## 2018-11-19 ENCOUNTER — Other Ambulatory Visit: Payer: Self-pay | Admitting: *Deleted

## 2018-11-19 DIAGNOSIS — M4854XA Collapsed vertebra, not elsewhere classified, thoracic region, initial encounter for fracture: Secondary | ICD-10-CM

## 2018-11-20 ENCOUNTER — Other Ambulatory Visit: Payer: Self-pay

## 2018-11-20 ENCOUNTER — Encounter: Payer: Self-pay | Admitting: Gastroenterology

## 2018-11-20 ENCOUNTER — Ambulatory Visit (INDEPENDENT_AMBULATORY_CARE_PROVIDER_SITE_OTHER): Payer: Medicare Other | Admitting: Gastroenterology

## 2018-11-20 VITALS — BP 124/62 | HR 73 | Temp 98.4°F | Ht 66.0 in | Wt 130.0 lb

## 2018-11-20 DIAGNOSIS — Z9229 Personal history of other drug therapy: Secondary | ICD-10-CM | POA: Diagnosis not present

## 2018-11-20 DIAGNOSIS — D509 Iron deficiency anemia, unspecified: Secondary | ICD-10-CM | POA: Diagnosis not present

## 2018-11-20 NOTE — Patient Instructions (Signed)
Your provider has requested that you go to the basement level for lab work in August 2020- 1 week before follow-up visit.   Press "B" on the elevator. The lab is located at the first door on the left as you exit the elevator.   Continue Iron twice daily.   Our office will contact you to schedule your next follow-up appointment for August 2020.   Thank you for choosing me and [facility].  Dr. Rush Landmark

## 2018-11-20 NOTE — Progress Notes (Signed)
[place] VISIT   Primary Care Provider Grace [name], [place] [address] [phone]  Patient Profile: Grace [name] is a 68 y.o. female with a pmh significant for CHFrEF, Breast cancer survivor, AVR/MVR (on Coumadin 2.5-3.5), prior Hodgkin's Lymphoma (s/p Radiation & Chemo), Hypothyroidism, HLD, iron deficiency anemia, vertebral fracture.  The patient presents to the [facility] for an evaluation and management of problem(s) noted below:  Problem List 1. Iron deficiency anemia, unspecified iron deficiency anemia type   2. Hx of long-term (current) use of anticoagulants     History of Present Illness: Please see initial consultation note from [facility] and my subsequent follow-up notes for full details of HPI.    Interval History The patient from a GI perspective has been doing well overall.  We reviewed her recent blood count and iron indices showing a stable iron saturation greater than 16%.  Stools are darker as a result of her iron supplementation.  We held off on her IV iron infusion due to patient preference.  She is dealing with multiple issues including back pain and also is in the process of trying to be evaluated at [facility] due to her prior history of multiple oncologic cancers and previous radiation treatments.  There is plan for possible kyphoplasty.  She has no abdominal pain.  She denies any nausea or vomiting.  GI Review of Systems Positive as above Negative for early satiety, change in bowel habits, diarrhea  Review of Systems General: Denies fevers/chills/weight loss Cardiovascular: Denies chest pain Pulmonary: Denies shortness of breath Gastroenterological: See HPI Genitourinary: Denies darkened urine or hematuria Hematological: Remains at increased bruising/bleeding due to Coumadin Dermatological: Denies jaundice Psychological: Mood is anxious about her medical issues starting to  pile on   Medications Current Outpatient Medications  Medication Sig Dispense Refill   aspirin 81 MG tablet Take 81 mg by mouth daily.       aspirin-acetaminophen-caffeine (EXCEDRIN MIGRAINE) 250-250-65 MG tablet Take 2 tablets by mouth every 6 (six) hours as needed for headache.     calcitonin, salmon, (MIACALCIN) 200 UNIT/ACT nasal spray Place 1 spray into alternate nostrils daily. Use for maximum duration of 2-4 weeks. 3.7 mL 0   diphenhydramine-acetaminophen (TYLENOL PM) 25-500 MG TABS tablet Take 1 tablet by mouth at bedtime as needed.     docusate sodium (COLACE) 100 MG capsule Take 100 mg by mouth daily.      ezetimibe (ZETIA) 10 MG tablet Take 10 mg by mouth daily.      FERROUS SULFATE PO Take 325 mg by mouth 2 (two) times a day.      furosemide (LASIX) 40 MG tablet TAKE 1 TABLET BY MOUTH  DAILY 90 tablet 1   ibandronate (BONIVA) 150 MG tablet Take 1 tablet (150 mg total) by mouth every 30 (thirty) days. In AM w. full glass of h20 and do not take anything else by mouth for 60 mins 3 tablet 3   levothyroxine (SYNTHROID) 112 MCG tablet TAKE 1 TABLET BY MOUTH  DAILY 90 tablet 3   Magnesium 250 MG TABS Take 1 tablet by mouth daily.     metoprolol (TOPROL-XL) 50 MG 24 hr tablet Take 50 mg by mouth 2 (two) times daily.      Multiple Vitamin (MULTI-VITAMIN DAILY) TABS Take by mouth daily.     potassium chloride SA (K-DUR,KLOR-CON) 20 MEQ tablet TAKE 1 TABLET BY MOUTH  DAILY 90 tablet 1   spironolactone (ALDACTONE) 25  MG tablet Take 25 mg by mouth 2 (two) times daily.      traMADol (ULTRAM) 50 MG tablet TAKE 1 TABLET (50 MG TOTAL) BY MOUTH EVERY 8 (EIGHT) HOURS AS NEEDED FOR UP TO 7 DAYS. 20 tablet 0   warfarin (COUMADIN) 5 MG tablet Take 1 tablet daily except 1 1/2 tablets on Fridays or AS DIRECTED BY ANTICOAGULATION CLINIC 90 DAY (Patient taking differently: Take 1 tablet daily) 100 tablet 1   No current facility-administered medications for this visit.      Allergies Allergies  Allergen Reactions   Digoxin Other (See Comments)   Statins     myalgia    Histories Past Medical History:  Diagnosis Date   Anemia    Aortic insufficiency    AORTIC VALVE REPLACEMENT, HX OF 04/27/2007   Qualifier: Diagnosis of  By: Grace Chang MD, Bruce     Cancer [facility])    breast   CHF (congestive heart failure) ([place])    Congestive heart failure ([place]) 08/03/2009   [facility] Dr. Paul [name]. Last echo 2010 with EF 30%. Restrictive due to radiation. AVR MVR. 02/2014 visit planned.     GI bleed    Hodgkin's disease    Hyperlipidemia    Hypothyroidism    Migraine    MITRAL VALVE REPLACEMENT, HX OF 04/27/2007   Qualifier: Diagnosis of  By: Grace Chang MD, Bruce     WPW (Wolff-Parkinson-White syndrome)    Past Surgical History:  Procedure Laterality Date   AORTIC VALVE SURGERY     bilateral breast implants     ENDOMETRIAL ABLATION     MASTECTOMY     MITRAL VALVE REPLACEMENT     pericardial stripping  9/08   SPLENECTOMY     THORACOTOMY     TONSILLECTOMY     valvulopathy  06/13/06   Social History   Socioeconomic History   Marital status: Married    Spouse name: Not on file   Number of children: Not on file   Years of education: Not on file   Highest education level: Not on file  Occupational History   Not on file  Social Needs   Financial resource strain: Not on file   Food insecurity    Worry: Not on file    Inability: Not on file   Transportation needs    Medical: Not on file    Non-medical: Not on file  Tobacco Use   Smoking status: Never Smoker   Smokeless tobacco: Never Used  Substance and Sexual Activity   Alcohol use: No   Drug use: No   Sexual activity: Not on file  Lifestyle   Physical activity    Days per week: Not on file    Minutes per session: Not on file   Stress: Not on file  Relationships   Social connections    Talks on phone: Not on file    Gets together: Not on file    Attends  religious service: Not on file    Active member of club or organization: Not on file    Attends meetings of clubs or organizations: Not on file    Relationship status: Not on file   Intimate partner violence    Fear of current or ex partner: Not on file    Emotionally abused: Not on file    Physically abused: Not on file    Forced sexual activity: Not on file  Other Topics Concern   Not on file  Social History Narrative   Lived  in [place] for 18 years. Moved here for the weather.    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      Husband retired January 2015. Married 1991 (2nd marriage). No kids.       Hobbies: walks 2 miles per day, read, work outside         Family History  Problem Relation Age of Onset   Heart disease Mother    Cancer Mother        breast   Heart disease Maternal Aunt    Cancer Maternal Aunt        uterine   Heart disease Maternal Grandmother    Diabetes Maternal Grandmother    Hypercalcemia Neg Hx    Colon cancer Neg Hx    Esophageal cancer Neg Hx    Liver disease Neg Hx    Pancreatic cancer Neg Hx    Rectal cancer Neg Hx    Stomach cancer Neg Hx    I have reviewed her medical, social, and family history in detail and updated the electronic medical record as necessary.    PHYSICAL EXAMINATION  BP 124/62    Pulse 73    Temp 98.4 F (36.9 C)    Ht 5\' 6"  (1.676 m)    Wt 130 lb (59 kg)    BMI 20.98 kg/m  GEN: NAD, appears stated age, doesn't appear chronically ill PSYCH: Cooperative, without pressured speech EYE: Conjunctivae pink, sclerae anicteric ENT: MMM CV: Click with systolic murmur present, non-tachycardic without rubs  RESP: Decreased breath sounds at the bases bilaterally but no adventitious sounds GI: NABS, soft, NT/ND, without rebound or guarding, no HSM appreciated MSK/EXT: Pain upon palpation of the thoracic vertebrae SKIN: No jaundice NEURO:  Alert &  Oriented x 3, no focal deficits   REVIEW OF DATA  I reviewed the following data at the time of this encounter:  GI Procedures and Studies  No relevant studies to review  Laboratory Studies  Reviewed in epic   Imaging Studies  No relevant studies to review   ASSESSMENT  Ms. Acocella is a 68 y.o. female with a pmh significant for CHFrEF, Breast cancer survivor, AVR/MVR (on Coumadin 2.5-3.5), prior Hodgkin's Lymphoma (s/p Radiation & Chemo), Hypothyroidism, HLD, iron deficiency anemia, vertebral fracture.  The patient is seen today for evaluation and management of:  1. Iron deficiency anemia, unspecified iron deficiency anemia type   2. Hx of long-term (current) use of anticoagulants    The patient is hemodynamically and clinically stable from the perspective of her iron deficiency anemia.  She still requires an endoscopic evaluation from above and below and will require a Lovenox bridge.  This procedure will be done in the hospital-based setting due to her valvular heart disease and per her cardiologist recommendations as well.  As the patient is dealing with multiple other medical issues at this point in time and her iron saturation is greater than 16% and her blood counts have stabilized I think it is okay for [place] to continue to monitor for a period of time while she gets other things in order.  We will continue her oral iron twice daily.  We will plan to repeat her blood work in approximately 6 to 8 weeks.  We will repeat a earlier if there are issues or concerns of symptomatic anemia redeveloping.  We will re-discuss timing of procedures once her labs have returned and based on how her other medical issues  are being handled.  Patient is appreciative for the care she has received.  The risks and benefits of endoscopic evaluation were discussed with the patient; these include but are not limited to the risk of perforation, infection, bleeding, missed lesions, lack of diagnosis, severe illness  requiring hospitalization, as well as anesthesia and sedation related illnesses.  The patient is agreeable to proceed.  All patient questions were answered, to the best of my ability, and the patient agrees to the aforementioned plan of action with follow-up as indicated.   PLAN  Laboratories as outlined below in 6 to 8 weeks Continue p.o. iron twice daily Hold on IV iron for now but based on repeat iron indices may be required EGD/colonoscopy to be performed in the near future (per patient request we are holding on this at this time)   Orders Placed This Encounter  Procedures   CBC   IBC + Ferritin    New Prescriptions   No medications on file   Modified Medications   No medications on file    Planned Follow Up: Return in about 2 months (around 01/20/2019).   Justice Britain, MD [facility] Office # [phone]

## 2018-11-20 NOTE — Telephone Encounter (Signed)
I think Dr. [name] placed referral to [facility] neurosurgery yesterday.   Team- when im out of office please let patients know that so they can expect some delay in return message potentially. Team please let her know sorry about delayed response- I was not forwarded the [employer].   Sounds like good news on the Dr. Avanell Shackleton find! Tell her that's great- I was not aware of that either- have her let me know how it goes  Oldest records I can see in media are from 2010 cardiology visit- hoping they can find records in paper storage.

## 2018-11-20 NOTE — Telephone Encounter (Signed)
Spoke to pt, told her sorry for th delay Dr. [name] has been out of the office will be back Monday but did answer your message. He said sounds like good news on the Dr. Avanell Shackleton find! Tell her that's great- I was not aware of that either- have her let me know how it goes. Also the oldest records he can see in media are from 2010 cardiology visit- hoping they can find records in paper storage. Pt verbalized understanding and said they did find her old records she has been talking with Medical Records.

## 2018-11-23 ENCOUNTER — Other Ambulatory Visit: Payer: Self-pay

## 2018-11-23 ENCOUNTER — Ambulatory Visit
Admission: RE | Admit: 2018-11-23 | Discharge: 2018-11-23 | Disposition: A | Discharge status: Home / Self Care | Payer: Medicare Other | Source: Ambulatory Visit | Attending: Family Medicine | Admitting: Family Medicine

## 2018-11-23 DIAGNOSIS — M4854XA Collapsed vertebra, not elsewhere classified, thoracic region, initial encounter for fracture: Secondary | ICD-10-CM

## 2018-11-24 ENCOUNTER — Other Ambulatory Visit: Payer: Self-pay | Admitting: Neurosurgery

## 2018-11-25 ENCOUNTER — Other Ambulatory Visit: Payer: Self-pay | Admitting: Neurosurgery

## 2018-11-25 ENCOUNTER — Other Ambulatory Visit: Payer: Self-pay | Admitting: Family Medicine

## 2018-11-26 ENCOUNTER — Ambulatory Visit: Payer: Medicare Other | Admitting: Family Medicine

## 2018-11-27 ENCOUNTER — Telehealth: Payer: Self-pay | Admitting: Family Medicine

## 2018-11-27 NOTE — Telephone Encounter (Signed)
Relation to pt: self  Call back number: [phone]  /  [phone]    Reason for call:  Patient was informed by Dr. Babs Bertin Neurosurgery office they would contact Dr. [name] to discuss continuing or d/c patient warfarin (COUMADIN) 5 MG tablet   prior to surgery, please advise

## 2018-11-30 NOTE — Telephone Encounter (Signed)
Form received and to Dr. [name] to review and sign.

## 2018-11-30 NOTE — Telephone Encounter (Signed)
Patient will need Lovenox bridge- sending to [facility] for her help.  Also will need approval from her [place]

## 2018-11-30 NOTE — Telephone Encounter (Signed)
See note

## 2018-12-01 ENCOUNTER — Other Ambulatory Visit: Payer: Self-pay | Admitting: General Practice

## 2018-12-01 ENCOUNTER — Other Ambulatory Visit: Payer: Self-pay

## 2018-12-01 ENCOUNTER — Ambulatory Visit (INDEPENDENT_AMBULATORY_CARE_PROVIDER_SITE_OTHER): Payer: Medicare Other | Admitting: General Practice

## 2018-12-01 DIAGNOSIS — Z952 Presence of prosthetic heart valve: Secondary | ICD-10-CM | POA: Diagnosis not present

## 2018-12-01 DIAGNOSIS — Z7901 Long term (current) use of anticoagulants: Secondary | ICD-10-CM

## 2018-12-01 LAB — POCT INR: INR: 2.5 (ref 2.0–3.0)

## 2018-12-01 MED ORDER — ENOXAPARIN SODIUM 60 MG/0.6ML ~~LOC~~ SOLN: 60.0000 mg | Freq: Two times a day (BID) | SUBCUTANEOUS | 0 refills | Status: DC

## 2018-12-01 NOTE — Patient Instructions (Addendum)
Pre visit review using our clinic review tool, if applicable. No additional management support is needed unless otherwise documented below in the visit note.  Continue to take 1 tablet daily except 1/2 tablet on Fridays.  Please follow patient instructions.  7/11 - Take last dose of coumadin until after procedure. 7/12 - Nothing (No coumadin and No Lovenox) 7/13 - Lovenox in the AM and PM 7/14 - Lovenox in the AM and PM 7/15 - Lovenox in the AM only 7/16 - Do not take Lovenox today 7/17 - Lovenox in the AM and PM AND 1 1/2 tablets of coumadin 7/18 - Lovenox in the AM and PM AND 1 1/2 tablets of coumadin 7/19 - Lovenox in the AM and PM AND 1 1/2 tablet of coumadin 7/20 - Lovenox in the AM and PM and 1 tablet of coumadin 7/21 - Check INR

## 2018-12-01 NOTE — Telephone Encounter (Addendum)
Spoke with patient. She contacted [facility] but he was on vacation. His PA Dwain Sarna is agreeable to d/c Warfarin prior to surgery. If you have any questions, they can be reached at [phone], may speak with PA or RN Altha Harm.   She saw Jenny Reichmann today and Lovenox regimen was established.   If we need a new form from [facility] can call and ask for Janett Billow and she will re-fax.   Spoke with Dr. [name], form was completed and faxed.

## 2018-12-02 NOTE — Telephone Encounter (Signed)
Pt called and stated that she would like a call back from cindy. Please advise. She would like to give her some information.   [phone]

## 2018-12-02 NOTE — Telephone Encounter (Signed)
Noted  

## 2018-12-04 NOTE — Pre-Procedure Instructions (Signed)
[place]  12/04/2018      [place], [address] [place] [employer] [address] [place] [address] Phone: [phone] Fax: [phone]  [id number] - [place], [place] [address] Phone: [phone] Fax: [phone]    Your procedure is scheduled on December 10, 2018.  Report to [facility] Entrance "A" at 700 AM.  Call this number if you have problems the morning of surgery:  [phone]   Call [phone] if you have any questions prior to your surgery date Monday-Friday 8am-4pm    Remember:  Do not eat or drink after midnight.    Take these medicines the morning of surgery with A SIP OF WATER Levothyroxine (synthroid) Metoprolol (Toprol XL)  Follow your surgeon's instructions on when to hold/resume aspirin, coumadin and lovenox.  If no instructions were given call the office to determine how they would like to you take aspirin, coumadin and lovenox  7 days prior to surgery STOP taking any Excedrin Migraine, Aleve, Naproxen, Ibuprofen, Motrin, Advil, Goody's, BC's, all herbal medications, fish oil, and all vitamins    Do not wear jewelry, make-up or nail polish.  Do not wear lotions, powders, or perfumes, or deodorant.  Do not shave 48 hours prior to surgery.  Do not bring valuables to the hospital.  [facility] is not responsible for any belongings or valuables.  Contacts, dentures or bridgework may not be worn into surgery.  Leave your suitcase in the car.  After surgery it may be brought to your room.  For patients admitted to the hospital, discharge time will be determined by your treatment team. IF you are a smoker, DO NOT Smoke 24 hours prior to surgery   IF you wear a CPAP at night please bring your mask, tubing, and machine the morning of surgery    Remember that you must have someone to transport you home after your surgery, and remain with you for 24  hours if you are discharged the same day.  Patients discharged the day of surgery will not be allowed to drive home.    [place]- Preparing For Surgery  Before surgery, you can play an important role. Because skin is not sterile, your skin needs to be as free of germs as possible. You can reduce the number of germs on your skin by washing with CHG (chlorahexidine gluconate) Soap before surgery.  CHG is an antiseptic cleaner which kills germs and bonds with the skin to continue killing germs even after washing.    Oral Hygiene is also important to reduce your risk of infection.  Remember - BRUSH YOUR TEETH THE MORNING OF SURGERY WITH YOUR REGULAR TOOTHPASTE  Please do not use if you have an allergy to CHG or antibacterial soaps. If your skin becomes reddened/irritated stop using the CHG.  Do not shave (including legs and underarms) for at least 48 hours prior to first CHG shower. It is OK to shave your face.  Please follow these instructions carefully.   1. Shower the NIGHT BEFORE SURGERY and the MORNING OF SURGERY with CHG.   2. If you chose to wash your hair, wash your hair first as usual with your normal shampoo.  3. After you shampoo, rinse your hair and body thoroughly to remove the shampoo.  4. Use CHG as you would any other liquid soap. You can apply CHG directly to the skin and wash gently with  a scrungie or a clean washcloth.   5. Apply the CHG Soap to your body ONLY FROM THE NECK DOWN.  Do not use on open wounds or open sores. Avoid contact with your eyes, ears, mouth and genitals (private parts). Wash Face and genitals (private parts)  with your normal soap.  6. Wash thoroughly, paying special attention to the area where your surgery will be performed.  7. Thoroughly rinse your body with warm water from the neck down.  8. DO NOT shower/wash with your normal soap after using and rinsing off the CHG Soap.  9. Pat yourself dry with a CLEAN TOWEL.  10. Wear CLEAN PAJAMAS to  bed the night before surgery, wear comfortable clothes the morning of surgery  11. Place CLEAN SHEETS on your bed the night of your first shower and DO NOT SLEEP WITH PETS.  Day of Surgery:  Do not apply any deodorants/lotions.  Please wear clean clothes to the hospital/surgery center.   Remember to brush your teeth WITH YOUR REGULAR TOOTHPASTE.   Please read over the following fact sheets that you were given.

## 2018-12-07 ENCOUNTER — Other Ambulatory Visit: Payer: Self-pay

## 2018-12-07 ENCOUNTER — Encounter (HOSPITAL_COMMUNITY): Payer: Self-pay

## 2018-12-07 ENCOUNTER — Other Ambulatory Visit (HOSPITAL_COMMUNITY)
Admission: RE | Admit: 2018-12-07 | Discharge: 2018-12-07 | Disposition: A | Discharge status: Home / Self Care | Payer: Medicare Other | Source: Ambulatory Visit | Attending: Neurosurgery | Admitting: Neurosurgery

## 2018-12-07 ENCOUNTER — Encounter (HOSPITAL_COMMUNITY)
Admission: RE | Admit: 2018-12-07 | Discharge: 2018-12-07 | Disposition: A | Discharge status: Home / Self Care | Payer: Medicare Other | Source: Ambulatory Visit | Attending: Neurosurgery | Admitting: Neurosurgery

## 2018-12-07 DIAGNOSIS — Z8571 Personal history of Hodgkin lymphoma: Secondary | ICD-10-CM | POA: Diagnosis not present

## 2018-12-07 DIAGNOSIS — Z8673 Personal history of transient ischemic attack (TIA), and cerebral infarction without residual deficits: Secondary | ICD-10-CM | POA: Diagnosis not present

## 2018-12-07 DIAGNOSIS — E039 Hypothyroidism, unspecified: Secondary | ICD-10-CM | POA: Diagnosis not present

## 2018-12-07 DIAGNOSIS — Z7901 Long term (current) use of anticoagulants: Secondary | ICD-10-CM | POA: Diagnosis not present

## 2018-12-07 DIAGNOSIS — Z7982 Long term (current) use of aspirin: Secondary | ICD-10-CM | POA: Diagnosis not present

## 2018-12-07 DIAGNOSIS — N189 Chronic kidney disease, unspecified: Secondary | ICD-10-CM | POA: Diagnosis not present

## 2018-12-07 DIAGNOSIS — R2689 Other abnormalities of gait and mobility: Secondary | ICD-10-CM | POA: Diagnosis not present

## 2018-12-07 DIAGNOSIS — E785 Hyperlipidemia, unspecified: Secondary | ICD-10-CM | POA: Diagnosis not present

## 2018-12-07 DIAGNOSIS — I4892 Unspecified atrial flutter: Secondary | ICD-10-CM | POA: Diagnosis not present

## 2018-12-07 DIAGNOSIS — Z1159 Encounter for screening for other viral diseases: Secondary | ICD-10-CM | POA: Insufficient documentation

## 2018-12-07 DIAGNOSIS — I130 Hypertensive heart and chronic kidney disease with heart failure and stage 1 through stage 4 chronic kidney disease, or unspecified chronic kidney disease: Secondary | ICD-10-CM | POA: Diagnosis not present

## 2018-12-07 DIAGNOSIS — M4854XA Collapsed vertebra, not elsewhere classified, thoracic region, initial encounter for fracture: Secondary | ICD-10-CM | POA: Diagnosis not present

## 2018-12-07 DIAGNOSIS — Z952 Presence of prosthetic heart valve: Secondary | ICD-10-CM | POA: Diagnosis not present

## 2018-12-07 DIAGNOSIS — Z79899 Other long term (current) drug therapy: Secondary | ICD-10-CM | POA: Diagnosis not present

## 2018-12-07 DIAGNOSIS — Z79890 Hormone replacement therapy (postmenopausal): Secondary | ICD-10-CM | POA: Diagnosis not present

## 2018-12-07 DIAGNOSIS — I509 Heart failure, unspecified: Secondary | ICD-10-CM | POA: Diagnosis not present

## 2018-12-07 DIAGNOSIS — I2510 Atherosclerotic heart disease of native coronary artery without angina pectoris: Secondary | ICD-10-CM | POA: Diagnosis not present

## 2018-12-07 DIAGNOSIS — Z7983 Long term (current) use of bisphosphonates: Secondary | ICD-10-CM | POA: Diagnosis not present

## 2018-12-07 DIAGNOSIS — S22050A Wedge compression fracture of T5-T6 vertebra, initial encounter for closed fracture: Secondary | ICD-10-CM | POA: Insufficient documentation

## 2018-12-07 DIAGNOSIS — Z01812 Encounter for preprocedural laboratory examination: Secondary | ICD-10-CM | POA: Insufficient documentation

## 2018-12-07 HISTORY — DX: Atherosclerotic heart disease of native coronary artery without angina pectoris: I25.10

## 2018-12-07 HISTORY — DX: Disease of pericardium, unspecified: I31.9

## 2018-12-07 HISTORY — DX: Transient cerebral ischemic attack, unspecified: G45.9

## 2018-12-07 HISTORY — DX: Rheumatic tricuspid insufficiency: I07.1

## 2018-12-07 HISTORY — DX: Cardiac arrhythmia, unspecified: I49.9

## 2018-12-07 HISTORY — DX: Chronic kidney disease, unspecified: N18.9

## 2018-12-07 LAB — CBC
HCT: 41.9 % (ref 36.0–46.0)
Hemoglobin: 13.1 g/dL (ref 12.0–15.0)
MCH: 29.8 pg (ref 26.0–34.0)
MCHC: 31.3 g/dL (ref 30.0–36.0)
MCV: 95.2 fL (ref 80.0–100.0)
Platelets: 466 10*3/uL — ABNORMAL HIGH (ref 150–400)
RBC: 4.4 MIL/uL (ref 3.87–5.11)
RDW: 15.5 % (ref 11.5–15.5)
WBC: 14.4 10*3/uL — ABNORMAL HIGH (ref 4.0–10.5)
nRBC: 0 % (ref 0.0–0.2)

## 2018-12-07 LAB — BASIC METABOLIC PANEL
Anion gap: 11 (ref 5–15)
BUN: 13 mg/dL (ref 8–23)
CO2: 27 mmol/L (ref 22–32)
Calcium: 9.7 mg/dL (ref 8.9–10.3)
Chloride: 101 mmol/L (ref 98–111)
Creatinine, Ser: 1.05 mg/dL — ABNORMAL HIGH (ref 0.44–1.00)
GFR calc Af Amer: >60 mL/min (ref >60)
GFR calc non Af Amer: 55 mL/min — ABNORMAL LOW (ref >60)
Glucose, Bld: 94 mg/dL (ref 70–99)
Potassium: 4.1 mmol/L (ref 3.5–5.1)
Sodium: 139 mmol/L (ref 135–145)

## 2018-12-07 LAB — PROTIME-INR
INR: 1.9 — ABNORMAL HIGH (ref 0.8–1.2)
Prothrombin Time: 21.8 seconds — ABNORMAL HIGH (ref 11.4–15.2)

## 2018-12-07 LAB — SURGICAL PCR SCREEN
MRSA, PCR: NEGATIVE
Staphylococcus aureus: NEGATIVE

## 2018-12-07 NOTE — Progress Notes (Signed)
PCP - Dr. Ansel Bong- [place]  Cardiologist - Dr. Alycia Rossetti  Chest x-ray - 06/23/2018 (E)  EKG - 12/30/17 (CE)-Req'd  Stress Test - Denies  ECHO - 06/07/17 (CE)  Cardiac Cath - 11/06/06 (CE)  AICD-na PM-na LOOP-na  Sleep Study - Denies CPAP - None  LABS- 12/07/2018: CBC, BMP, PCR, COVID  ASA- LD-7/11 Coumadin- LD- 7/11 Lovenox- started 7/13  ERAS- No   Anesthesia- Yes-cardiac history  Pt denies having chest pain, sob, or fever at this time. All instructions explained to the pt, with a verbal understanding of the material. Pt agrees to go over the instructions while at home for a better understanding. Pt also instructed to self quarantine after being tested for COVID-19. The opportunity to ask questions was provided.   Coronavirus Screening  Have you experienced the following symptoms:  Cough yes/no: No Fever (>100.25F)  yes/no: No Runny nose yes/no: No Sore throat yes/no: No Difficulty breathing/shortness of breath  yes/no: No  Have you or a family member traveled in the last 14 days and where? yes/no: No   If the patient indicates "YES" to the above questions, their PAT will be rescheduled to limit the exposure to others and, the surgeon will be notified. THE PATIENT WILL NEED TO BE ASYMPTOMATIC FOR 14 DAYS.   If the patient is not experiencing any of these symptoms, the PAT nurse will instruct them to NOT bring anyone with them to their appointment since they may have these symptoms or traveled as well.   Please remind your patients and families that hospital visitation restrictions are in effect and the importance of the restrictions.

## 2018-12-08 ENCOUNTER — Encounter (HOSPITAL_COMMUNITY): Payer: Self-pay

## 2018-12-08 LAB — SARS CORONAVIRUS 2 (TAT 6-24 HRS): SARS Coronavirus 2: NEGATIVE

## 2018-12-08 NOTE — Anesthesia Preprocedure Evaluation (Addendum)
Anesthesia Evaluation  Patient identified by MRN, date of birth, ID band Patient awake    Reviewed: Allergy & Precautions, NPO status , Patient's Chart, lab work & pertinent test results  History of Anesthesia Complications Negative for: history of anesthetic complications  Airway Mallampati: II  TM Distance: >3 FB Neck ROM: Full    Dental no notable dental hx. (+) Dental Advisory Given   Pulmonary neg pulmonary ROS,    Pulmonary exam normal        Cardiovascular hypertension, + CAD and +CHF  Normal cardiovascular exam+ Valvular Problems/Murmurs   S/P AVR, MVR Echo 06/07/17 ([facility]): - VERY POOR SOUND TRANSMISSION LIMITS QUALITY OF INTERPRETATION - MODERATE LV DYSFUNCTION (See above) LEFT VENTRICLE                       Anterior: HYPOCONTRACTILE     Size: Normal                           Lateral: HYPOCONTRACTILE  Contraction: REGIONALLY IMPAIRED                Septal: DYSKINETIC  Closest EF: 35% (Estimated)           Apical: HYPOCONTRACTILE  LV masses: No Masses                  Inferior: HYPOCONTRACTILE  LVH: None                                     Posterior: Normal - NORMAL RIGHT VENTRICULAR SYSTOLIC FUNCTION - VALVULAR REGURGITATION:  Mild PR Moderate TR (moderate to severe). 2.5 m/s peak TR vel  30 mmHg peak RV pressure - PROSTHETIC VALVE(S):  MECH PROSTHETIC AoV (44mm [employer])             Aortic: INDETERMINATE FOR AR  MECH PROSTHETIC AoV           2.2 m/s peak vel  20 mmHg peak grad 14 mmHg mean grad             Resting LVOT Vel: 0.6 m/s. Dimensionless Index: 0.21             Trivial AVR perivalvular leak not appreciated on today's study. MECH PROSTHETIC MV (74mm [facility])             Mitral: INDETERMINATE FOR MR  [facility] PROSTHETIC MV           1.9 m/s peak vel  14 mmHg peak grad  5 mmHg mean grad            MV  Inflow E Vel.= nm* cm/s MV Annulus E'Vel.= nm* cm/s E/E'Ratio= nm* - COMPARED WITH PRIOR ECHO STUDY ON 03/14/2015: NO SIGNIFICANT CHANGE (Comparison LVEF 35% on 03/14/15, LVEF 30% 08/18/08 [facility])    [place] 09/08/06([facility]): FINDINGS: 1. RA 12/11/9, RV 34/0/8, PA 32/17/23, PCW 15/14/12, cardiac output/index: Fick: 2/8/1.7; thermodilution 2/6/1.6. IMPRESSION/PLAN: The patient has essentially normal cardiac filling pressures with low cardiac output. The aortic prosthesis is well seen and has normal leaflet opening. The mitral prosthesis is not well seen. Specifically, no ring is noted.    Neuro/Psych TIAnegative psych ROS   GI/Hepatic negative GI ROS, Neg liver ROS,   Endo/Other  Hypothyroidism   Renal/GU negative Renal ROS     Musculoskeletal negative musculoskeletal ROS (+)   Abdominal  Peds  Hematology negative hematology ROS (+)   Anesthesia Other Findings Day of surgery medications reviewed with the patient.  Reproductive/Obstetrics                            Anesthesia Physical Anesthesia Plan  ASA: III  Anesthesia Plan: General   Post-op Pain Management:    Induction: Intravenous  PONV Risk Score and Plan: 3 and Ondansetron, Dexamethasone and Diphenhydramine  Airway Management Planned: Oral ETT  Additional Equipment:   Intra-op Plan:   Post-operative Plan: Extubation in OR  Informed Consent: I have reviewed the patients History and Physical, chart, labs and discussed the procedure including the risks, benefits and alternatives for the proposed anesthesia with the patient or authorized representative who has indicated his/her understanding and acceptance.     Dental advisory given  Plan Discussed with: CRNA and Anesthesiologist  Anesthesia Plan Comments: (See PAT note written 12/08/2018 by Myra Gianotti, PA-C.  )      Anesthesia Quick Evaluation

## 2018-12-08 NOTE — Progress Notes (Signed)
Anesthesia Chart Review:  Case: [id number] Date/Time: 12/10/18 0845   Procedure: KYPHOPLASTY THORACIC 6- THORACIC 7 (N/A ) - KYPHOPLASTY THORACIC 6- THORACIC 7   Anesthesia type: General   Pre-op diagnosis: COMPRESSION FRACTURE OF THORACIC 6 VERTEBRA   Location: [place] OR ROOM 44 / [place] [facility]   Surgeon: Newman Pies, MD      DISCUSSION: Patient is a 68 year old female scheduled for the above procedure.  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 Jude MVR and 19 mm mechanical [facility] AVR 06/13/06, [facility], [place], Elenore Rota, MD), constrictive pericarditis (s/p pericardial stripping through a lateral thoracotomy by Dr. Elenor Quinones, [facility] 01/8118), chronic systolic CHF (EF 14% 11/8293), Wolff-Parkinson-White syndrome, paroxsymal atrial flutter (2018), intermittent left BBB, tricuspid regurgitation (moderate-severe 2019), TIA (02/2007), hyperlipidemia, hypothyroidism, migraines, breast cancer (s/p mastectomy, bilateral breast reconstruction), splenectomy (~ 1974).  PCP Dr. [name] classified her as "moderate risk" for planned procedure.  Lovenox bridge recommended and communication with cardiology.  Patient contacted [facility] and spoke with Ventura Sellers, NP on 12/01/18 who was in agreement with Lovenox bridge while warfarin on hold for surgery. Instructions provided by Dr. [name] (outlined in [facility]). Patient reported last ASA 12/05/18, last warfarin 12/05/18 (with starting Lovenox bridge 12/07/18, last pre-operative dose 12/09/18 AM).  WBC of 14.4 called to [facility] at Dr. Adline Mango office. She needs a repeat PT/INR on the day of surgery given INR of 1.9 on 12/07/18.  12/07/18 presurgical COVID-19 test was negative. Anesthesia team to evaluate on the day of surgery.   VS: BP (!) 143/79   Pulse 100   Temp 36.5 C   Resp 18   Ht 5\' 6"  (1.676 m)   Wt 58.1 kg   SpO2  100%   BMI 20.68 kg/m    PROVIDERS: Marin Olp, MD is PCP - Venetia Maxon, MD is cardiologist ([facility], see Care Everywhere). Last visit 12/30/17.  She was doing well from a cardiac standpoint at that time. Notes indicate he discussed with her consideration of future ICD, although "worry that we could worsen her TR and not really make her any better. I think her sudden death risk is relatively low. Once again she is been okay with not pursing a device." Volume status good. She was no longer in aflutter, although he suspected she could develop again with anticipated continued conservative management.  Renato Shin, MD is endocrinologist. She was seen on 12/23/17 for hypercalcemia. Urine calcium was normal which "would suggest that your kidneys are not properly eliminating calcium." Patient has not been back for follow-up.   Rush Landmark, Valarie Merino, MD is GI.  Last visit 11/20/18 for iron deficiency anemia   LABS: Labs reviewed: Repeat PT/INR on the day of surgery. (all labs ordered are listed, but only abnormal results are displayed)  Labs Reviewed  BASIC METABOLIC PANEL - Abnormal; Notable for the following components:      Result Value   Creatinine, Ser 1.05 (*)    GFR calc non Af Amer 55 (*)    All other components within normal limits  CBC - Abnormal; Notable for the following components:   WBC 14.4 (*)    Platelets 466 (*)    All other components within normal limits  PROTIME-INR - Abnormal; Notable for the following components:   Prothrombin Time 21.8 (*)    INR 1.9 (*)    All other components  within normal limits  SURGICAL PCR SCREEN    IMAGES: MRI T-spine 11/23/18: IMPRESSION: 1. Acute to subacute T6 and T7 compression fractures as described above, with progressive height loss at T7 since recent thoracic spine x-rays. These are likely benign osteoporotic fractures. Prominently decreased T1 signal in both vertebral bodies is likely related to  underlying marrow edema and sclerosis. Consider bone biopsy at the time of kyphoplasty if performed or repeat thoracic spine MRI in 3 months to evaluate for interval change. 2. Mild stenosis at T7-T8 due to retropulsion of the T7 inferior Endplate.  CXR 06/23/18: FINDINGS: Cardiac shadows within normal limits. Postsurgical changes are again seen and stable. Bilateral breast implants are again noted. Chronic calcifications are noted bilaterally. No focal infiltrate or effusion is seen. No acute bony abnormality is noted. IMPRESSION: Chronic changes without acute abnormality.  CT Chest 04/29/18: IMPRESSION: 1. No pleural mass along the LEFT LATERAL hemithorax as questioned on the chest x-ray yesterday. 2. No acute cardiopulmonary disease. 3. Linear scarring in the lower lobes, RIGHT MIDDLE LOBE and lingula. Biapical pleuroparenchymal scarring and pleural scarring posteromedially in the LEFT hemithorax as noted previously. Post radiation fibrosis medially in the RIGHT UPPER LOBE and superior segment RIGHT LOWER LOBE. 4. Moderate cardiomegaly with enlargement of all 4 chambers. Moderate to severe three-vessel coronary atherosclerosis. Prior aortic valve replacement and mitral valve replacement. 5. Stable large LEFT adrenal adenoma. 6. No pathologic lymphadenopathy in the chest or upper abdomen. An index RIGHT retrocrural lymph node in the upper abdomen measures 1.2 Cm.   EKG:  EKG 12/30/17 [facility]): Tracing requested. According to [facility] CE: Result Narrative Vent Rate (bpm) 86   PR Interval (msec) 232   QRS Interval (msec) 94   QT Interval (msec) 398   QTc (msec) 476   Other Result Information  This result has an attachment that is not available.  Result Narrative  Sinus rhythm 1st degree AV block Nonspecific ST depression Abnormal ECG When compared with ECG of 01-Jul-2017 11:11, No significant change was found I reviewed and concur with this report.  Electronically signed [id number], MD, NEIL ([id number]) on 12/31/2017 8:10:28 AM   - It appears she has an intermittent left BBB (mentioned in post-MVR/AVR cardiology follow-up notes and on result narrative of 03/14/15 EKG tracing in [facility].  CV: Holter Monitor 07/01/17-07/02/17 ([facility] CE): Min HR: 32 BPM At: 07/01/2017 3:28:27 PM Max HR: 104 BPM At: 07/02/2017 10:55:47 PM Avg HR: 85 BPM Pauses > 2013ms:1 Longest RR: 2.034 at 07/02/2017 4:52:36 PM Ventricular Beats: 731 Couplets: 15 Runs: 0 Fastest Run:BPM at  Longest Ventricular Run:BPM at  Supraventricular Beats: 40 Supraventricular Fastest Run: 83 BPM at 07/02/2017 4:53:00 PM Conclusions:  1) This 48 hour Holter scan was adequate for interpretation  2) The patient was in sinus rhythm, sinus bradycardia, sinus tachycardia sinus arrhythmia (strips 7, 11).  3) Ventricular ectopic activity consisted of multifocal PVCs (strips 5, 9),  couplets (strips 6, 10).  4) Supraventricular ectopic activity consisted of PACs (strips 8, 11), rare nonsustained ectopic actrial rhythm. The longest and fastest run was 13 beats, 83 BPM at 4:43 PM (strip 12).  5) There were no pauses greater than 2.0  seconds noted.  6) There were no symptoms noted in patient diary, nor was the event button activated.   Echo 06/07/17 ([facility] CE): - VERY POOR SOUND TRANSMISSION LIMITS QUALITY OF INTERPRETATION - MODERATE LV DYSFUNCTION (See above) LEFT VENTRICLE  Anterior: HYPOCONTRACTILE     Size: Normal                  Lateral: HYPOCONTRACTILE  Contraction: REGIONALLY IMPAIRED     Septal: DYSKINETIC  Closest EF: 35% (Estimated)           Apical: HYPOCONTRACTILE  LV masses: No Masses                Inferior: HYPOCONTRACTILE  LVH: None                   Posterior: Normal - NORMAL RIGHT VENTRICULAR SYSTOLIC FUNCTION - VALVULAR REGURGITATION:  Mild PR Moderate TR  (moderate to severe). 2.5 m/s peak TR vel  30 mmHg peak RV pressure - PROSTHETIC VALVE(S):  MECH PROSTHETIC AoV (23mm [employer]ENCY MECHANICAL)  Aortic: INDETERMINATE FOR AR  MECH PROSTHETIC AoV    2.2 m/s peak vel  20 mmHg peak grad 14 mmHg mean grad   Resting LVOT Vel: 0.6 m/s. Dimensionless Index: 0.21  Trivial AVR perivalvular leak not appreciated on today's study. MECH PROSTHETIC MV (63mm ST. JUDE)   Mitral: INDETERMINATE FOR MR  [facility] PROSTHETIC MV    1.9 m/s peak vel  14 mmHg peak grad  5 mmHg mean grad    MV Inflow E Vel.= nm* cm/s MV Annulus E'Vel.= nm* cm/s E/E'Ratio= nm* - COMPARED WITH PRIOR ECHO STUDY ON 03/14/2015: NO SIGNIFICANT CHANGE (Comparison LVEF 35% on 03/14/15, LVEF 30% 08/18/08 [facility])    [place] 09/08/06([facility]): FINDINGS:  1. RA 12/11/9, RV 34/0/8, PA 32/17/23, PCW 15/14/12, cardiac      output/index:  Fick:  2/8/1.7; thermodilution 2/6/1.6. IMPRESSION/PLAN:  The patient has essentially normal cardiac filling  pressures with low cardiac output.  The aortic prosthesis is well seen  and has normal leaflet opening.  The mitral prosthesis is not well seen.  Specifically, no ring is noted.   Cardiac cath 11/06/06 (PRE-AVR/MVR, [facility] CE): Coronary Artery Disease Left Main:insignificant (40% ostium LM) LAD system:significant (60% discrete distal LAD). Has small D3. LCX system:normal RCA system:insignificant (30% discrete ostium RCA)  Number of vessels with significant CAD:1 - Vessel Right Heart Catheterization Pulmonary Hypertension:Mild   Past Medical History:  Diagnosis Date  . Anemia   . Aortic insufficiency   . AORTIC VALVE REPLACEMENT, HX OF 04/27/2007   Qualifier: Diagnosis of  By: Leanne Chang MD, Bruce    . Cancer (HCC)    breast  . CHF (congestive heart failure) ([place])   . Chronic kidney disease   . Congestive heart failure ([place]) 08/03/2009   [facility] Dr. Paul Half. Last echo 2010 with EF 30%. Restrictive due to  radiation. AVR MVR. 02/2014 visit planned.    . Coronary artery disease    11/06/06 [facility]): 40% oLM, 60% dLAD, 30% oRCA  . Dysrhythmia    paroxsymal a-flutter 2018; intermittent left BBB (2016)  . GI bleed   . Hodgkin's disease    ~1974, s/p Mantle field radiation  . Hyperlipidemia   . Hypothyroidism   . Migraine   . MITRAL VALVE REPLACEMENT, HX OF 04/27/2007   Qualifier: Diagnosis of  By: Leanne Chang MD, Bruce    . Pericarditis    s/p pericardidal stripping through lateral thoracotomy 01/2007 [facility])  . TIA (transient ischemic attack)   . Tricuspid regurgitation   . WPW (Wolff-Parkinson-White syndrome)     Past Surgical History:  Procedure Laterality Date  . AORTIC VALVE SURGERY     19 mm mechanical [facility]ent AVR 05/2006 [facility], Dr. Lajuana Matte)  .  bilateral breast implants    . ENDOMETRIAL ABLATION    . EXPLORATORY LAPAROTOMY    . MASTECTOMY    . MITRAL VALVE REPLACEMENT     25 mm St. Jude MVR, 05/2006, Dr. Lajuana Matte, [facility]  . pericardial stripping  9/08  . SPLENECTOMY     ~ 1974  . SPLENECTOMY, TOTAL    . THORACOTOMY     for pericardial stripping 01/2007 (Dr. Elenor Quinones, [facility])  . TONSILLECTOMY    . valvulopathy  06/13/06    MEDICATIONS: . aspirin 81 MG tablet  . aspirin-acetaminophen-caffeine (EXCEDRIN MIGRAINE) 250-250-65 MG tablet  . calcitonin, salmon, (MIACALCIN) 200 UNIT/ACT nasal spray  . diphenhydramine-acetaminophen (TYLENOL PM) 25-500 MG TABS tablet  . docusate sodium (COLACE) 100 MG capsule  . enoxaparin (LOVENOX) 60 MG/0.6ML injection  . ezetimibe (ZETIA) 10 MG tablet  . ferrous sulfate 325 (65 FE) MG tablet  . furosemide (LASIX) 40 MG tablet  . ibandronate (BONIVA) 150 MG tablet  . levothyroxine (SYNTHROID) 112 MCG tablet  . Magnesium 250 MG TABS  . metoprolol (TOPROL-XL) 50 MG 24 hr tablet  . Multiple Vitamin (MULTI-VITAMIN DAILY) TABS  . potassium chloride SA (K-DUR,KLOR-CON) 20 MEQ tablet  . spironolactone (ALDACTONE) 25 MG tablet  . warfarin (COUMADIN) 5  MG tablet   No current facility-administered medications for this encounter.     Myra Gianotti, PA-C Surgical Short Stay/Anesthesiology [facility] Phone [phone] [facility] Phone [phone] 12/08/2018 1:32 PM

## 2018-12-10 ENCOUNTER — Encounter (HOSPITAL_COMMUNITY)
Admission: RE | Disposition: A | Discharge status: Home / Self Care | Payer: Self-pay | Source: Home / Self Care | Attending: Neurosurgery

## 2018-12-10 ENCOUNTER — Encounter (HOSPITAL_COMMUNITY): Payer: Self-pay

## 2018-12-10 ENCOUNTER — Ambulatory Visit (HOSPITAL_COMMUNITY): Payer: Medicare Other | Admitting: Vascular Surgery

## 2018-12-10 ENCOUNTER — Ambulatory Visit (HOSPITAL_COMMUNITY): Payer: Medicare Other

## 2018-12-10 ENCOUNTER — Other Ambulatory Visit: Payer: Self-pay

## 2018-12-10 ENCOUNTER — Ambulatory Visit (HOSPITAL_COMMUNITY)
Admission: RE | Admit: 2018-12-10 | Discharge: 2018-12-10 | Disposition: A | Discharge status: Home / Self Care | Payer: Medicare Other | Attending: Neurosurgery | Admitting: Neurosurgery

## 2018-12-10 DIAGNOSIS — Z1159 Encounter for screening for other viral diseases: Secondary | ICD-10-CM | POA: Diagnosis not present

## 2018-12-10 DIAGNOSIS — Z79890 Hormone replacement therapy (postmenopausal): Secondary | ICD-10-CM | POA: Insufficient documentation

## 2018-12-10 DIAGNOSIS — Z8673 Personal history of transient ischemic attack (TIA), and cerebral infarction without residual deficits: Secondary | ICD-10-CM | POA: Insufficient documentation

## 2018-12-10 DIAGNOSIS — N189 Chronic kidney disease, unspecified: Secondary | ICD-10-CM | POA: Diagnosis not present

## 2018-12-10 DIAGNOSIS — Z952 Presence of prosthetic heart valve: Secondary | ICD-10-CM | POA: Insufficient documentation

## 2018-12-10 DIAGNOSIS — I509 Heart failure, unspecified: Secondary | ICD-10-CM | POA: Insufficient documentation

## 2018-12-10 DIAGNOSIS — Z419 Encounter for procedure for purposes other than remedying health state, unspecified: Secondary | ICD-10-CM

## 2018-12-10 DIAGNOSIS — M4854XA Collapsed vertebra, not elsewhere classified, thoracic region, initial encounter for fracture: Secondary | ICD-10-CM | POA: Diagnosis not present

## 2018-12-10 DIAGNOSIS — S22000G Wedge compression fracture of unspecified thoracic vertebra, subsequent encounter for fracture with delayed healing: Secondary | ICD-10-CM

## 2018-12-10 DIAGNOSIS — I130 Hypertensive heart and chronic kidney disease with heart failure and stage 1 through stage 4 chronic kidney disease, or unspecified chronic kidney disease: Secondary | ICD-10-CM | POA: Diagnosis not present

## 2018-12-10 DIAGNOSIS — R2689 Other abnormalities of gait and mobility: Secondary | ICD-10-CM | POA: Insufficient documentation

## 2018-12-10 DIAGNOSIS — Z79899 Other long term (current) drug therapy: Secondary | ICD-10-CM | POA: Insufficient documentation

## 2018-12-10 DIAGNOSIS — Z8571 Personal history of Hodgkin lymphoma: Secondary | ICD-10-CM | POA: Insufficient documentation

## 2018-12-10 DIAGNOSIS — E039 Hypothyroidism, unspecified: Secondary | ICD-10-CM | POA: Insufficient documentation

## 2018-12-10 DIAGNOSIS — Z7901 Long term (current) use of anticoagulants: Secondary | ICD-10-CM | POA: Insufficient documentation

## 2018-12-10 DIAGNOSIS — I2510 Atherosclerotic heart disease of native coronary artery without angina pectoris: Secondary | ICD-10-CM | POA: Insufficient documentation

## 2018-12-10 DIAGNOSIS — E785 Hyperlipidemia, unspecified: Secondary | ICD-10-CM | POA: Insufficient documentation

## 2018-12-10 DIAGNOSIS — I4892 Unspecified atrial flutter: Secondary | ICD-10-CM | POA: Insufficient documentation

## 2018-12-10 DIAGNOSIS — Z7983 Long term (current) use of bisphosphonates: Secondary | ICD-10-CM | POA: Insufficient documentation

## 2018-12-10 DIAGNOSIS — Z7982 Long term (current) use of aspirin: Secondary | ICD-10-CM | POA: Insufficient documentation

## 2018-12-10 HISTORY — PX: KYPHOPLASTY: SHX5884

## 2018-12-10 LAB — PROTIME-INR
INR: 1.2 (ref 0.8–1.2)
Prothrombin Time: 14.6 seconds (ref 11.4–15.2)

## 2018-12-10 SURGERY — KYPHOPLASTY
Anesthesia: General

## 2018-12-10 MED ORDER — PROPOFOL 10 MG/ML IV BOLUS
INTRAVENOUS | Status: DC | PRN
  Administered 2018-12-10: 80 mg via INTRAVENOUS

## 2018-12-10 MED ORDER — ONDANSETRON HCL 4 MG/2ML IJ SOLN
INTRAMUSCULAR | Status: AC
  Filled 2018-12-10: qty 2

## 2018-12-10 MED ORDER — CHLORHEXIDINE GLUCONATE CLOTH 2 % EX PADS: 6.0000 | MEDICATED_PAD | Freq: Once | CUTANEOUS | Status: DC

## 2018-12-10 MED ORDER — EZETIMIBE 10 MG PO TABS
10.0000 mg | ORAL_TABLET | Freq: Every day | ORAL | Status: DC
  Administered 2018-12-10: 10 mg via ORAL
  Filled 2018-12-10: qty 1

## 2018-12-10 MED ORDER — ALBUMIN HUMAN 5 % IV SOLN
INTRAVENOUS | Status: DC | PRN
  Administered 2018-12-10: 10:00:00 via INTRAVENOUS

## 2018-12-10 MED ORDER — MAGNESIUM OXIDE 400 (241.3 MG) MG PO TABS: 200.0000 mg | ORAL_TABLET | Freq: Every day | ORAL | Status: DC

## 2018-12-10 MED ORDER — PROPOFOL 10 MG/ML IV BOLUS
INTRAVENOUS | Status: AC
  Filled 2018-12-10: qty 20

## 2018-12-10 MED ORDER — FERROUS SULFATE 325 (65 FE) MG PO TABS: 325.0000 mg | ORAL_TABLET | Freq: Two times a day (BID) | ORAL | Status: DC

## 2018-12-10 MED ORDER — PHENOL 1.4 % MT LIQD: 1.0000 | OROMUCOSAL | Status: DC | PRN

## 2018-12-10 MED ORDER — MIDAZOLAM HCL 5 MG/5ML IJ SOLN
INTRAMUSCULAR | Status: DC | PRN
  Administered 2018-12-10: 1 mg via INTRAVENOUS

## 2018-12-10 MED ORDER — ACETAMINOPHEN 325 MG PO TABS: 650.0000 mg | ORAL_TABLET | ORAL | Status: DC | PRN

## 2018-12-10 MED ORDER — DIPHENHYDRAMINE HCL 50 MG/ML IJ SOLN
INTRAMUSCULAR | Status: AC
  Filled 2018-12-10: qty 1

## 2018-12-10 MED ORDER — LIDOCAINE 2% (20 MG/ML) 5 ML SYRINGE
INTRAMUSCULAR | Status: DC | PRN
  Administered 2018-12-10: 60 mg via INTRAVENOUS

## 2018-12-10 MED ORDER — ONDANSETRON HCL 4 MG/2ML IJ SOLN: 4.0000 mg | Freq: Four times a day (QID) | INTRAMUSCULAR | Status: DC | PRN

## 2018-12-10 MED ORDER — OXYCODONE HCL 5 MG PO TABS
ORAL_TABLET | ORAL | Status: AC
  Filled 2018-12-10: qty 1

## 2018-12-10 MED ORDER — FENTANYL CITRATE (PF) 100 MCG/2ML IJ SOLN
INTRAMUSCULAR | Status: DC | PRN
  Administered 2018-12-10: 25 ug via INTRAVENOUS
  Administered 2018-12-10: 50 ug via INTRAVENOUS
  Administered 2018-12-10 (×2): 25 ug via INTRAVENOUS

## 2018-12-10 MED ORDER — BUPIVACAINE-EPINEPHRINE (PF) 0.5% -1:200000 IJ SOLN
INTRAMUSCULAR | Status: AC
  Filled 2018-12-10: qty 30

## 2018-12-10 MED ORDER — SODIUM CHLORIDE 0.9% FLUSH: 3.0000 mL | INTRAVENOUS | Status: DC | PRN

## 2018-12-10 MED ORDER — BISACODYL 10 MG RE SUPP: 10.0000 mg | Freq: Every day | RECTAL | Status: DC | PRN

## 2018-12-10 MED ORDER — FENTANYL CITRATE (PF) 100 MCG/2ML IJ SOLN: 25.0000 ug | INTRAMUSCULAR | Status: DC | PRN

## 2018-12-10 MED ORDER — ONDANSETRON HCL 4 MG PO TABS: 4.0000 mg | ORAL_TABLET | Freq: Four times a day (QID) | ORAL | Status: DC | PRN

## 2018-12-10 MED ORDER — ADULT MULTIVITAMIN W/MINERALS CH: 1.0000 | ORAL_TABLET | Freq: Every day | ORAL | Status: DC

## 2018-12-10 MED ORDER — CEFAZOLIN SODIUM-DEXTROSE 2-4 GM/100ML-% IV SOLN
2.0000 g | Freq: Three times a day (TID) | INTRAVENOUS | Status: DC
  Administered 2018-12-10: 17:00:00 2 g via INTRAVENOUS
  Filled 2018-12-10: qty 100

## 2018-12-10 MED ORDER — DOCUSATE SODIUM 100 MG PO CAPS: 100.0000 mg | ORAL_CAPSULE | Freq: Two times a day (BID) | ORAL | Status: DC

## 2018-12-10 MED ORDER — ARTIFICIAL TEARS OPHTHALMIC OINT
TOPICAL_OINTMENT | OPHTHALMIC | Status: AC
  Filled 2018-12-10: qty 3.5

## 2018-12-10 MED ORDER — PROMETHAZINE HCL 25 MG/ML IJ SOLN: 6.2500 mg | INTRAMUSCULAR | Status: DC | PRN

## 2018-12-10 MED ORDER — OXYCODONE HCL 5 MG PO TABS
10.0000 mg | ORAL_TABLET | ORAL | Status: DC | PRN
  Administered 2018-12-10: 10 mg via ORAL
  Filled 2018-12-10: qty 2

## 2018-12-10 MED ORDER — ASPIRIN-ACETAMINOPHEN-CAFFEINE 250-250-65 MG PO TABS
2.0000 | ORAL_TABLET | Freq: Four times a day (QID) | ORAL | Status: DC | PRN
  Filled 2018-12-10: qty 2

## 2018-12-10 MED ORDER — ALBUMIN HUMAN 5 % IV SOLN
INTRAVENOUS | Status: AC
  Filled 2018-12-10: qty 250

## 2018-12-10 MED ORDER — HYDROCODONE-ACETAMINOPHEN 5-325 MG PO TABS: 1.0000 | ORAL_TABLET | ORAL | 0 refills | Status: DC | PRN

## 2018-12-10 MED ORDER — IBANDRONATE SODIUM 150 MG PO TABS: 150.0000 mg | ORAL_TABLET | ORAL | Status: DC

## 2018-12-10 MED ORDER — ZOLPIDEM TARTRATE 5 MG PO TABS: 5.0000 mg | ORAL_TABLET | Freq: Every evening | ORAL | Status: DC | PRN

## 2018-12-10 MED ORDER — METOPROLOL SUCCINATE ER 50 MG PO TB24: 50.0000 mg | ORAL_TABLET | Freq: Two times a day (BID) | ORAL | Status: DC

## 2018-12-10 MED ORDER — POTASSIUM CHLORIDE CRYS ER 20 MEQ PO TBCR: 20.0000 meq | EXTENDED_RELEASE_TABLET | Freq: Every day | ORAL | Status: DC

## 2018-12-10 MED ORDER — ACETAMINOPHEN 500 MG PO TABS
1000.0000 mg | ORAL_TABLET | Freq: Four times a day (QID) | ORAL | Status: DC
  Administered 2018-12-10: 1000 mg via ORAL
  Filled 2018-12-10: qty 2

## 2018-12-10 MED ORDER — CYCLOBENZAPRINE HCL 10 MG PO TABS: 10.0000 mg | ORAL_TABLET | Freq: Three times a day (TID) | ORAL | Status: DC | PRN

## 2018-12-10 MED ORDER — BACITRACIN ZINC 500 UNIT/GM EX OINT
TOPICAL_OINTMENT | CUTANEOUS | Status: AC
  Filled 2018-12-10: qty 28.35

## 2018-12-10 MED ORDER — 0.9 % SODIUM CHLORIDE (POUR BTL) OPTIME
TOPICAL | Status: DC | PRN
  Administered 2018-12-10: 1000 mL

## 2018-12-10 MED ORDER — LIDOCAINE 2% (20 MG/ML) 5 ML SYRINGE
INTRAMUSCULAR | Status: AC
  Filled 2018-12-10: qty 5

## 2018-12-10 MED ORDER — SODIUM CHLORIDE 0.9 % IV SOLN: 250.0000 mL | INTRAVENOUS | Status: DC

## 2018-12-10 MED ORDER — FENTANYL CITRATE (PF) 250 MCG/5ML IJ SOLN
INTRAMUSCULAR | Status: AC
  Filled 2018-12-10: qty 5

## 2018-12-10 MED ORDER — DEXAMETHASONE SODIUM PHOSPHATE 10 MG/ML IJ SOLN
INTRAMUSCULAR | Status: DC | PRN
  Administered 2018-12-10: 5 mg via INTRAVENOUS

## 2018-12-10 MED ORDER — LACTATED RINGERS IV SOLN
INTRAVENOUS | Status: DC
  Administered 2018-12-10: 07:00:00 via INTRAVENOUS

## 2018-12-10 MED ORDER — SPIRONOLACTONE 25 MG PO TABS
25.0000 mg | ORAL_TABLET | Freq: Two times a day (BID) | ORAL | Status: DC
  Filled 2018-12-10 (×2): qty 1

## 2018-12-10 MED ORDER — FUROSEMIDE 40 MG PO TABS: 40.0000 mg | ORAL_TABLET | Freq: Every day | ORAL | Status: DC

## 2018-12-10 MED ORDER — SODIUM CHLORIDE 0.9% FLUSH: 3.0000 mL | Freq: Two times a day (BID) | INTRAVENOUS | Status: DC

## 2018-12-10 MED ORDER — ALBUMIN HUMAN 5 % IV SOLN
12.5000 g | Freq: Once | INTRAVENOUS | Status: AC
  Administered 2018-12-10: 12:00:00 12.5 g via INTRAVENOUS

## 2018-12-10 MED ORDER — HYDROCODONE-ACETAMINOPHEN 5-325 MG PO TABS: 1.0000 | ORAL_TABLET | ORAL | Status: DC | PRN

## 2018-12-10 MED ORDER — MIDAZOLAM HCL 2 MG/2ML IJ SOLN
INTRAMUSCULAR | Status: AC
  Filled 2018-12-10: qty 2

## 2018-12-10 MED ORDER — PROPOFOL 1000 MG/100ML IV EMUL
INTRAVENOUS | Status: AC
  Filled 2018-12-10: qty 100

## 2018-12-10 MED ORDER — SUGAMMADEX SODIUM 200 MG/2ML IV SOLN
INTRAVENOUS | Status: DC | PRN
  Administered 2018-12-10: 120 mg via INTRAVENOUS

## 2018-12-10 MED ORDER — MORPHINE SULFATE (PF) 4 MG/ML IV SOLN: 4.0000 mg | INTRAVENOUS | Status: DC | PRN

## 2018-12-10 MED ORDER — DEXAMETHASONE SODIUM PHOSPHATE 10 MG/ML IJ SOLN
INTRAMUSCULAR | Status: AC
  Filled 2018-12-10: qty 1

## 2018-12-10 MED ORDER — CEFAZOLIN SODIUM-DEXTROSE 2-4 GM/100ML-% IV SOLN
2.0000 g | INTRAVENOUS | Status: AC
  Administered 2018-12-10: 2 g via INTRAVENOUS
  Filled 2018-12-10: qty 100

## 2018-12-10 MED ORDER — ALBUMIN HUMAN 5 % IV SOLN
12.5000 g | Freq: Once | INTRAVENOUS | Status: AC
  Administered 2018-12-10: 12.5 g via INTRAVENOUS

## 2018-12-10 MED ORDER — ROCURONIUM BROMIDE 10 MG/ML (PF) SYRINGE
PREFILLED_SYRINGE | INTRAVENOUS | Status: AC
  Filled 2018-12-10: qty 10

## 2018-12-10 MED ORDER — BUPIVACAINE-EPINEPHRINE 0.5% -1:200000 IJ SOLN
INTRAMUSCULAR | Status: DC | PRN
  Administered 2018-12-10: 10 mL

## 2018-12-10 MED ORDER — ACETAMINOPHEN 650 MG RE SUPP: 650.0000 mg | RECTAL | Status: DC | PRN

## 2018-12-10 MED ORDER — PROPOFOL 500 MG/50ML IV EMUL
INTRAVENOUS | Status: AC
  Filled 2018-12-10: qty 50

## 2018-12-10 MED ORDER — LEVOTHYROXINE SODIUM 112 MCG PO TABS
112.0000 ug | ORAL_TABLET | Freq: Every day | ORAL | Status: DC
  Filled 2018-12-10: qty 1

## 2018-12-10 MED ORDER — OXYCODONE HCL 5 MG PO TABS: 5.0000 mg | ORAL_TABLET | ORAL | Status: DC | PRN

## 2018-12-10 MED ORDER — MENTHOL 3 MG MT LOZG: 1.0000 | LOZENGE | OROMUCOSAL | Status: DC | PRN

## 2018-12-10 MED ORDER — ACETAMINOPHEN 500 MG PO TABS
1000.0000 mg | ORAL_TABLET | Freq: Once | ORAL | Status: AC
  Administered 2018-12-10: 1000 mg via ORAL
  Filled 2018-12-10: qty 2

## 2018-12-10 MED ORDER — DIPHENHYDRAMINE HCL 50 MG/ML IJ SOLN
INTRAMUSCULAR | Status: DC | PRN
  Administered 2018-12-10: 12.5 mg via INTRAVENOUS

## 2018-12-10 MED ORDER — PROPOFOL 500 MG/50ML IV EMUL
INTRAVENOUS | Status: DC | PRN
  Administered 2018-12-10: 100 ug/kg/min via INTRAVENOUS

## 2018-12-10 MED ORDER — ROCURONIUM BROMIDE 10 MG/ML (PF) SYRINGE
PREFILLED_SYRINGE | INTRAVENOUS | Status: DC | PRN
  Administered 2018-12-10: 30 mg via INTRAVENOUS

## 2018-12-10 MED ORDER — BACITRACIN ZINC 500 UNIT/GM EX OINT
TOPICAL_OINTMENT | CUTANEOUS | Status: DC | PRN
  Administered 2018-12-10: 1 via TOPICAL

## 2018-12-10 MED ORDER — SODIUM CHLORIDE 0.9 % IV SOLN
INTRAVENOUS | Status: DC | PRN
  Administered 2018-12-10: 30 ug/min via INTRAVENOUS

## 2018-12-10 MED ORDER — IOPAMIDOL (ISOVUE-300) INJECTION 61%
INTRAVENOUS | Status: DC | PRN
  Administered 2018-12-10: 100 mL

## 2018-12-10 MED ORDER — ONDANSETRON HCL 4 MG/2ML IJ SOLN
INTRAMUSCULAR | Status: DC | PRN
  Administered 2018-12-10: 4 mg via INTRAVENOUS

## 2018-12-10 SURGICAL SUPPLY — 43 items
APL SKNCLS STERI-STRIP NONHPOA (GAUZE/BANDAGES/DRESSINGS) ×1
BENZOIN TINCTURE PRP APPL 2/3 (GAUZE/BANDAGES/DRESSINGS) ×3 IMPLANT
BLADE CLIPPER SURG (BLADE) IMPLANT
BLADE SURG 15 STRL LF DISP TIS (BLADE) ×1 IMPLANT
BLADE SURG 15 STRL SS (BLADE) ×3
CARTRIDGE OIL MAESTRO DRILL (MISCELLANEOUS) ×1 IMPLANT
CEMENT KYPHON C01A KIT/MIXER (Cement) ×2 IMPLANT
CLOSURE WOUND 1/2 X4 (GAUZE/BANDAGES/DRESSINGS) ×1
CONT SPEC 4OZ CLIKSEAL STRL BL (MISCELLANEOUS) ×4 IMPLANT
COVER WAND RF STERILE (DRAPES) ×3 IMPLANT
DIFFUSER DRILL AIR PNEUMATIC (MISCELLANEOUS) ×3 IMPLANT
DRAPE C-ARM 42X72 X-RAY (DRAPES) ×3 IMPLANT
DRAPE HALF SHEET 40X57 (DRAPES) ×3 IMPLANT
DRAPE INCISE IOBAN 66X45 STRL (DRAPES) ×3 IMPLANT
DRAPE LAPAROTOMY 100X72X124 (DRAPES) ×3 IMPLANT
DRAPE SURG 17X23 STRL (DRAPES) ×12 IMPLANT
DRAPE WARM FLUID 44X44 (DRAPES) ×3 IMPLANT
DRSG OPSITE POSTOP 4X6 (GAUZE/BANDAGES/DRESSINGS) ×2 IMPLANT
DRSG TELFA 3X8 NADH (GAUZE/BANDAGES/DRESSINGS) ×3 IMPLANT
GAUZE 4X4 16PLY RFD (DISPOSABLE) ×3 IMPLANT
GAUZE SPONGE 4X4 12PLY STRL (GAUZE/BANDAGES/DRESSINGS) ×1 IMPLANT
GLOVE BIO SURGEON STRL SZ8 (GLOVE) ×3 IMPLANT
GLOVE BIO SURGEON STRL SZ8.5 (GLOVE) ×3 IMPLANT
GLOVE EXAM NITRILE XL STR (GLOVE) IMPLANT
GOWN STRL REUS W/ TWL LRG LVL3 (GOWN DISPOSABLE) IMPLANT
GOWN STRL REUS W/ TWL XL LVL3 (GOWN DISPOSABLE) IMPLANT
GOWN STRL REUS W/TWL LRG LVL3 (GOWN DISPOSABLE)
GOWN STRL REUS W/TWL XL LVL3 (GOWN DISPOSABLE)
KIT BASIN OR (CUSTOM PROCEDURE TRAY) ×3 IMPLANT
KIT TURNOVER KIT B (KITS) ×3 IMPLANT
NEEDLE HYPO 22GX1.5 SAFETY (NEEDLE) ×3 IMPLANT
NS IRRIG 1000ML POUR BTL (IV SOLUTION) ×3 IMPLANT
OIL CARTRIDGE MAESTRO DRILL (MISCELLANEOUS)
PACK EENT II TURBAN DRAPE (CUSTOM PROCEDURE TRAY) ×3 IMPLANT
PAD ARMBOARD 7.5X6 YLW CONV (MISCELLANEOUS) ×11 IMPLANT
PAD DRESSING TELFA 3X8 NADH (GAUZE/BANDAGES/DRESSINGS) IMPLANT
SPECIMEN JAR SMALL (MISCELLANEOUS) IMPLANT
STRIP CLOSURE SKIN 1/2X4 (GAUZE/BANDAGES/DRESSINGS) ×2 IMPLANT
SUT VIC AB 3-0 SH 8-18 (SUTURE) ×3 IMPLANT
SYR CONTROL 10ML LL (SYRINGE) ×3 IMPLANT
TOWEL GREEN STERILE (TOWEL DISPOSABLE) ×3 IMPLANT
TOWEL GREEN STERILE FF (TOWEL DISPOSABLE) ×3 IMPLANT
TRAY KYPHOPAK 15/2 EXPRESS (KITS) ×4 IMPLANT

## 2018-12-10 NOTE — H&P (Signed)
Subjective: The patient is a 68 year old white female with multiple medical problems including cardiac valvular disease, Coumadin anticoagulation, cancer, etc.  She is developed severe thoracic pain.  She has failed medical management and was worked up with a thoracic MRI.  This demonstrated T6 and T7 fractures.  I discussed the various treatment options with the patient.  She has decided to proceed with a kyphoplasty.  She has held her Coumadin.  Past Medical History:  Diagnosis Date  . Anemia   . Aortic insufficiency   . AORTIC VALVE REPLACEMENT, HX OF 04/27/2007   Qualifier: Diagnosis of  By: Leanne Chang MD, Bruce    . Cancer ([place])    left breast  . CHF (congestive heart failure) ([place])   . Chronic kidney disease   . Congestive heart failure ([place]) 08/03/2009   [facility] Dr. Paul Half. Last echo 2010 with EF 30%. Restrictive due to radiation. AVR MVR. 02/2014 visit planned.    . Coronary artery disease    11/06/06 [facility]): 40% oLM, 60% dLAD, 30% oRCA  . Dysrhythmia    paroxsymal a-flutter 2018; intermittent left BBB (2016)  . GI bleed   . Hodgkin's disease    ~1974, s/p Mantle field radiation  . Hyperlipidemia   . Hypothyroidism   . Migraine   . MITRAL VALVE REPLACEMENT, HX OF 04/27/2007   Qualifier: Diagnosis of  By: Leanne Chang MD, Bruce    . Pericarditis    s/p pericardidal stripping through lateral thoracotomy 01/2007 [facility])  . TIA (transient ischemic attack)   . Tricuspid regurgitation   . WPW (Wolff-Parkinson-White syndrome)     Past Surgical History:  Procedure Laterality Date  . AORTIC VALVE SURGERY     19 mm mechanical [facility]ent AVR 05/2006 [facility], Dr. Lajuana Matte)  . bilateral breast implants    . ENDOMETRIAL ABLATION    . EXPLORATORY LAPAROTOMY    . MASTECTOMY    . MITRAL VALVE REPLACEMENT     25 mm St. Jude MVR, 05/2006, Dr. Lajuana Matte, [facility]  . pericardial stripping  9/08  . SPLENECTOMY     ~ 1974  . SPLENECTOMY, TOTAL    . THORACOTOMY     for pericardial stripping 01/2007 (Dr.  Elenor Quinones, [facility])  . TONSILLECTOMY    . valvulopathy  06/13/06    Allergies  Allergen Reactions  . Digoxin Other (See Comments)  . Statins     myalgia    Social History   Tobacco Use  . Smoking status: Never Smoker  . Smokeless tobacco: Never Used  Substance Use Topics  . Alcohol use: No    Family History  Problem Relation Age of Onset  . Heart disease Mother   . Cancer Mother        breast  . Heart disease Maternal Aunt   . Cancer Maternal Aunt        uterine  . Heart disease Maternal Grandmother   . Diabetes Maternal Grandmother   . Hypercalcemia Neg Hx   . Colon cancer Neg Hx   . Esophageal cancer Neg Hx   . Liver disease Neg Hx   . Pancreatic cancer Neg Hx   . Rectal cancer Neg Hx   . Stomach cancer Neg Hx    Prior to Admission medications   Medication Sig Start Date End Date Taking? Authorizing Provider  aspirin 81 MG tablet Take 81 mg by mouth daily.     Yes [provider]  aspirin-acetaminophen-caffeine (EXCEDRIN MIGRAINE) [phone] MG tablet Take 2 tablets by mouth every 6 (  six) hours as needed for headache.   Yes [provider]  diphenhydramine-acetaminophen (TYLENOL PM) 25-500 MG TABS tablet Take 1 tablet by mouth at bedtime as needed (sleep).    Yes [provider]  docusate sodium (COLACE) 100 MG capsule Take 100 mg by mouth daily.    Yes [provider]  enoxaparin (LOVENOX) 60 MG/0.6ML injection Inject 0.6 mLs (60 mg total) into the skin every 12 (twelve) hours. 12/01/18  Yes Marin Olp, MD  ezetimibe (ZETIA) 10 MG tablet Take 10 mg by mouth daily.  12/30/17 12/30/18 Yes [provider]  ferrous sulfate 325 (65 FE) MG tablet Take 325 mg by mouth 2 (two) times a day.    Yes [provider]  furosemide (LASIX) 40 MG tablet TAKE 1 TABLET BY MOUTH  DAILY Patient taking differently: Take 40 mg by mouth daily.  05/01/18  Yes Marin Olp, MD  ibandronate (BONIVA) 150 MG tablet Take 1 tablet (150 mg total)  by mouth every 30 (thirty) days. In AM w. full glass of h20 and do not take anything else by mouth for 60 mins 08/29/18  Yes Marin Olp, MD  levothyroxine (SYNTHROID) 112 MCG tablet TAKE 1 TABLET BY MOUTH  DAILY Patient taking differently: Take 112 mcg by mouth daily before breakfast.  10/08/18  Yes Marin Olp, MD  Magnesium 250 MG TABS Take 250 mg by mouth daily.    Yes [provider]  metoprolol (TOPROL-XL) 50 MG 24 hr tablet Take 50 mg by mouth 2 (two) times daily.    Yes [provider]  Multiple Vitamin (MULTI-VITAMIN DAILY) TABS Take 1 tablet by mouth daily.    Yes [provider]  potassium chloride SA (K-DUR,KLOR-CON) 20 MEQ tablet TAKE 1 TABLET BY MOUTH  DAILY Patient taking differently: Take 20 mEq by mouth daily.  05/18/18  Yes Marin Olp, MD  spironolactone (ALDACTONE) 25 MG tablet Take 25 mg by mouth 2 (two) times daily.    Yes [provider]  warfarin (COUMADIN) 5 MG tablet Take 1 tablet daily except 1 1/2 tablets on Fridays or AS DIRECTED BY ANTICOAGULATION CLINIC 90 DAY Patient taking differently: Take 2.5-5 mg by mouth See admin instructions. Take 5 mg by mouth daily except Fridays take 2.5 mg 08/11/18  Yes Marin Olp, MD  calcitonin, salmon, (MIACALCIN) 200 UNIT/ACT nasal spray Place 1 spray into alternate nostrils daily. Use for maximum duration of 2-4 weeks. Patient not taking: Reported on 11/26/2018 11/03/18   Rosemarie Ax, MD     Review of Systems  Positive ROS: As above  All other systems have been reviewed and were otherwise negative with the exception of those mentioned in the HPI and as above.  Objective: Vital signs in last 24 hours: Temp:  [97.8 F (36.6 C)] 97.8 F (36.6 C) (07/16 0719) Pulse Rate:  [100] 100 (07/16 0719) Resp:  [18] 18 (07/16 0719) BP: (154)/(79) 154/79 (07/16 0719) SpO2:  [100 %] 100 % (07/16 0719) Weight:  [58.1 kg] 58.1 kg (07/16 0719) Estimated body mass index is 20.68  kg/m as calculated from the following:   Height as of this encounter: 5\' 6"  (1.676 m).   Weight as of this encounter: 58.1 kg.   General Appearance: Alert Head: Normocephalic, without obvious abnormality, atraumatic Eyes: PERRL, conjunctiva/corneas clear, EOM's intact,    Ears: Normal  Throat: Normal  Neck: Supple, Back: unremarkable Lungs: Clear to auscultation bilaterally, respirations unlabored Heart: Regular rate and rhythm,  no murmur, rub or gallop Abdomen: Soft, non-tender Extremities: Extremities normal, atraumatic, no cyanosis or edema Skin: unremarkable  NEUROLOGIC:   Mental status: alert and oriented,Motor Exam - grossly normal Sensory Exam - grossly normal Reflexes:  Coordination - grossly normal Gait - grossly normal Balance - grossly normal Cranial Nerves: I: smell Not tested  II: visual acuity  OS: Normal  OD: Normal   II: visual fields Full to confrontation  II: pupils Equal, round, reactive to light  III,VII: ptosis None  III,IV,VI: extraocular muscles  Full ROM  V: mastication Normal  V: facial light touch sensation  Normal  V,VII: corneal reflex  Present  VII: facial muscle function - upper  Normal  VII: facial muscle function - lower Normal  VIII: hearing Not tested  IX: soft palate elevation  Normal  IX,X: gag reflex Present  XI: trapezius strength  5/5  XI: sternocleidomastoid strength 5/5  XI: neck flexion strength  5/5  XII: tongue strength  Normal    Data Review Lab Results  Component Value Date   WBC 14.4 (H) 12/07/2018   HGB 13.1 12/07/2018   HCT 41.9 12/07/2018   MCV 95.2 12/07/2018   PLT 466 (H) 12/07/2018   Lab Results  Component Value Date   NA 139 12/07/2018   K 4.1 12/07/2018   CL 101 12/07/2018   CO2 27 12/07/2018   BUN 13 12/07/2018   CREATININE 1.05 (H) 12/07/2018   GLUCOSE 94 12/07/2018   Lab Results  Component Value Date   INR 1.2 12/10/2018    Assessment/Plan: T6 and T7 compression fracture, thoracic spine  pain: I have discussed the situation with the patient.  I reviewed her imaging studies with her and pointed out the abnormalities.  We have discussed the various treatment options including surgery.  I have described the surgical treatment option of a T6 and T7 kyphoplasty.  I have shown her surgical models.  I have given her a surgical pamphlet.  We have discussed the risks, benefits, alternatives, expected postoperative course, and likelihood of achieving our goals with surgery.  I have answered all her questions.  She has decided to proceed with surgery.   Ophelia Charter 12/10/2018 9:02 AM

## 2018-12-10 NOTE — Anesthesia Procedure Notes (Signed)
Procedure Name: Intubation Date/Time: 12/10/2018 9:25 AM Performed by: Cleda Daub, CRNA Pre-anesthesia Checklist: Patient identified, Emergency Drugs available, Suction available and Patient being monitored Patient Re-evaluated:Patient Re-evaluated prior to induction Oxygen Delivery Method: Circle system utilized Preoxygenation: Pre-oxygenation with 100% oxygen Induction Type: IV induction Ventilation: Mask ventilation without difficulty and Mask ventilation throughout procedure Laryngoscope Size: Miller and 2 Grade View: Grade I Tube type: Oral Tube size: 7.0 mm Airway Equipment and Method: Stylet Placement Confirmation: ETT inserted through vocal cords under direct vision,  positive ETCO2 and breath sounds checked- equal and bilateral Secured at: 19 cm Tube secured with: Tape Dental Injury: Teeth and Oropharynx as per pre-operative assessment

## 2018-12-10 NOTE — Progress Notes (Signed)
Pt doing well. Pt given D/C instructions with verbal understanding. Rx was sent to pharmacy by MD. Pt's dressing was changed prior to D/C. Pt's incision has no sign of infection. Pt's IV was removed prior to D/C. Pt D/C'd home via wheelchair @ 1910 per MD order. Pt is stable @ D/C and has no other needs at this time. Holli Humbles, RN

## 2018-12-10 NOTE — Op Note (Signed)
Brief history: The patient is a 68 year old white female with multiple medical problems who has suffered T6 and T7 compression fractures.  She has failed medical management and decided to proceed with kyphoplasty after weighing the risks, benefits and alternatives.  Preop diagnosis: T6 and T7 compression fracture, thoracic spine pain  Postop diagnosis: The same  Procedure: Bilateral T6 and T7 kyphoplasty's with T6 and T7 vertebral body biopsy  Surgeon: Dr. Earle Gell  Assistant: None  Anesthesia: General tracheal  Estimated blood loss: Minimal  Specimens: T6 and T7 vertebral body biopsy  Drains: None  Complications: None  Description of procedure: The patient was brought to the operating room by the anesthesia team.  General endotracheal anesthesia was induced.  The patient was then turned to the prone position on the chest rolls.  Her thoracic region was then prepared with Betadine scrub and Betadine solution.  Sterile drapes were applied.  I then injected the area to be incised with Marcaine with epinephrine solution.  I made small incisions over the bilateral T6 and T7 pedicles.  Under biplanar fluoroscopic guidance I cannulated the bilateral T6 and T7 pedicles with the Jamshidi needle.  I removed the trocar and obtained a vertebral body biopsies via the cannulas at the right T6 and T7 vertebral bodies.  I then drilled down the cannulas into the vertebral bodies, remove the drill, inserted a balloon, inflated the balloons, deflated balloons, remove the balloons, I then injected bone cement into the bilateral T6 and T7 vertebral bodies via the pedicles and cannulas.  This was done under biplanar fluoroscopic guidance.  After I was satisfied with the filling of the bone cement we remove the cannulas.  I reapproximated patient's subcutaneous tissue with interrupted 3-0 Vicryl suture.  I reapproximated the skin with Steri-Strips and benzoin.  A sterile dressing was applied.  The drapes were  removed.  By report all sponge, instrument, and needle counts were correct at the end this case.

## 2018-12-10 NOTE — Evaluation (Signed)
Physical Therapy Evaluation Patient Details Name: Grace [name] MRN: [id number] DOB: July 15, 1950 Today's Date: 12/10/2018   History of Present Illness  68 yo female with onset of spinal pain with failed conservative measures was admitted for kyphoplasty of T6, T7 on 7/16.  PMHx:  cardiac valvular disease, coumadin, breast CA, CKD, CHF, CAD, Hodgkins disease, mitral Valve replacement, migraines, pericarditis, TIA, [name] Parkinson's syndrome  Clinical Impression  Pt was seen for mobility and is anticipating a trip home today.  Pt was given a RW only because her head felt "woozy" after surgery, but was able to stand with fair balance with no AD.  Will expect her to make excellent progress with moving at home with husband to supervise, and will not expect her to need more therapy unless she does not progress as surgeon will expect.  Follow up with surgeon as indicated, but will see for PT in the AM if she does not leave to come home today.    Follow Up Recommendations No PT follow up    Equipment Recommendations  None recommended by PT    Recommendations for Other Services       Precautions / Restrictions Precautions Precautions: Back Precaution Booklet Issued: Yes (comment) Precaution Comments: pt did not retain precautions information after teaching Restrictions Weight Bearing Restrictions: No      Mobility  Bed Mobility Overal bed mobility: Needs Assistance Bed Mobility: Sidelying to Sit;Sit to Sidelying   Sidelying to sit: Min assist     Sit to sidelying: Min assist General bed mobility comments: requires both tactile and verbal cues  Transfers Overall transfer level: Needs assistance Equipment used: Rolling walker (2 wheeled);1 person hand held assist Transfers: Sit to/from Stand Sit to Stand: Min guard         General transfer comment: cues for  hand placement and recall of precautions  Ambulation/Gait Ambulation/Gait assistance: Min guard Gait Distance (Feet):  120 Feet Assistive device: Rolling walker (2 wheeled);1 person hand held assist Gait Pattern/deviations: Step-through pattern;Decreased stride length;Wide base of support;Trunk flexed Gait velocity: reduced Gait velocity interpretation: <1.31 ft/sec, indicative of household ambulator General Gait Details: prompts for avoiding lifting walker and care with setting limits of gait  Stairs Stairs: (not needed)          Wheelchair Mobility    Modified Rankin (Stroke Patients Only)       Balance Overall balance assessment: Needs assistance Sitting-balance support: Feet supported Sitting balance-Leahy Scale: Good     Standing balance support: Bilateral upper extremity supported Standing balance-Leahy Scale: Fair Standing balance comment: head feels "woozy" a little                             Pertinent Vitals/Pain Pain Assessment: Faces Faces Pain Scale: No hurt Pain Location: no complaints    Home Living Family/patient expects to be discharged to:: Private residence Living Arrangements: Spouse/significant other Available Help at Discharge: Family;Available 24 hours/day Type of Home: House Home Access: Level entry     Home Layout: One level Home Equipment: None;Cane - single point(cane is wooden and not hers)      Prior Function Level of Independence: Independent         Comments: was able to drive and do housework prior to spine pain     Hand Dominance   Dominant Hand: Right    Extremity/Trunk Assessment   Upper Extremity Assessment Upper Extremity Assessment: Overall WFL for tasks assessed  Lower Extremity Assessment Lower Extremity Assessment: Overall WFL for tasks assessed    Cervical / Trunk Assessment Cervical / Trunk Assessment: Other exceptions(new kyphoplasty)  Communication   Communication: No difficulties  Cognition Arousal/Alertness: Awake/alert Behavior During Therapy: Impulsive Overall Cognitive Status: No  family/caregiver present to determine baseline cognitive functioning                                 General Comments: pt has minor judgment issues of mobility and setting limits, but also recall of instructions is limited      General Comments General comments (skin integrity, edema, etc.): pt was seen for mobility and strength testing, noted her recall of precautions was not there, provided written instructions and reviewed them for transfers and bed mob    Exercises     Assessment/Plan    PT Assessment Patient needs continued PT services  PT Problem List Decreased range of motion;Decreased activity tolerance;Decreased balance;Decreased mobility;Decreased coordination;Decreased cognition;Decreased knowledge of use of DME;Decreased safety awareness;Decreased skin integrity       PT Treatment Interventions DME instruction;Gait training;Functional mobility training;Therapeutic activities;Therapeutic exercise;Balance training;Neuromuscular re-education;Patient/family education    PT Goals (Current goals can be found in the Care Plan section)  Acute Rehab PT Goals Patient Stated Goal: to walk and get home with husband PT Goal Formulation: With patient Time For Goal Achievement: 12/17/18 Potential to Achieve Goals: Good    Frequency Min 4X/week   Barriers to discharge   home in level environment with husband    Co-evaluation               AM-PAC PT "6 Clicks" Mobility  Outcome Measure Help needed turning from your back to your side while in a flat bed without using bedrails?: None Help needed moving from lying on your back to sitting on the side of a flat bed without using bedrails?: A Little Help needed moving to and from a bed to a chair (including a wheelchair)?: A Little Help needed standing up from a chair using your arms (e.g., wheelchair or bedside chair)?: A Little Help needed to walk in hospital room?: A Little Help needed climbing 3-5 steps with a  railing? : A Lot 6 Click Score: 18    End of Session   Activity Tolerance: Patient tolerated treatment well;Treatment limited secondary to medical complications (Comment)(recent anesthesia and pain meds) Patient left: in bed;with call bell/phone within reach;with bed alarm set Nurse Communication: Mobility status PT Visit Diagnosis: Unsteadiness on feet (R26.81);Dizziness and giddiness (R42)    Time: 5329-9242 PT Time Calculation (min) (ACUTE ONLY): 24 min   Charges:   PT Evaluation $PT Eval Moderate Complexity: 1 Mod PT Treatments $Gait Training: 8-22 mins       Ramond Dial 12/10/2018, 2:30 PM   Mee Hives, PT MS Acute Rehab Dept. Number: [place] [facility] [place]

## 2018-12-10 NOTE — Progress Notes (Signed)
PHARMACIST - PHYSICIAN COMMUNICATION  CONCERNING: P&T Medication Policy Regarding Oral Bisphosphonates  RECOMMENDATION: Your order for alendronate (Fosamax), ibandronate (Boniva), or risedronate (Actonel) has been discontinued at this time.  If the patient's post-hospital medical condition warrants safe use of this class of drugs, please resume the pre-hospital regimen upon discharge.  DESCRIPTION:  Alendronate (Fosamax), ibandronate (Boniva), and risedronate (Actonel) can cause severe esophageal erosions in patients who are unable to remain upright at least 30 minutes after taking this medication.   Since brief interruptions in therapy are thought to have minimal impact on bone mineral density, the [facility] has established that bisphosphonate orders should be routinely discontinued during hospitalization.   To override this safety policy and permit administration of Boniva, Fosamax, or Actonel in the hospital, prescribers must write "DO NOT HOLD" in the comments section when placing the order for this class of medications.   Nicole Cella, RPh Clinical Pharmacist [phone] [place] Please check AMION for all [facility] Pharm[facility] phone numbers After 10:00 PM, call [place] [phone] 12/10/2018 1:44 PM

## 2018-12-10 NOTE — Transfer of Care (Signed)
Immediate Anesthesia Transfer of Care Note  Patient: Grace [name]  Procedure(s) Performed: KYPHOPLASTY THORACIC SIX- THORACIC SEVEN (N/A )  Patient Location: PACU  Anesthesia Type:General  Level of Consciousness: drowsy and patient cooperative  Airway & Oxygen Therapy: Patient Spontanous Breathing and Patient connected to face mask oxygen  Post-op Assessment: Report given to RN and Post -op Vital signs reviewed and stable  Post vital signs: Reviewed and stable  Last Vitals:  Vitals Value Taken Time  BP 107/45 12/10/18 1111  Temp    Pulse 97 12/10/18 1115  Resp 13 12/10/18 1115  SpO2 100 % 12/10/18 1115  Vitals shown include unvalidated device data.  Last Pain:  Vitals:   12/10/18 0719  PainSc: 5       Patients Stated Pain Goal: 3 (67/20/91 9802)  Complications: No apparent anesthesia complications

## 2018-12-10 NOTE — Discharge Summary (Signed)
Physician Discharge Summary  Patient ID: Grace [name] MRN: [id number] DOB/AGE: 1950-09-27 68 y.o.  Admit date: 12/10/2018 Discharge date: 12/10/2018  Admission Diagnoses:T6 and T7 compression fracture, thoracic spine pain  Discharge Diagnoses:  The same Active Problems:   Thoracic compression fracture, with delayed healing, subsequent encounter   Discharged Condition: good  Hospital Course:  Performed a T6 and T7 kyphoplasty and vertebral body biopsy on the patient on 12/10/2018.  The surgery went well.    The patient's postoperative course was unremarkable.  She requested discharge home on the evening of surgery.  She was given written and oral discharge instructions.  Consults:  None Significant Diagnostic Studies: none Treatments: T6 and T7 kyphoplasty with vertebral body biopsies Discharge Exam: Blood pressure 116/60, pulse (!) 105, temperature 97.8 F (36.6 C), temperature source Oral, resp. rate 18, height 5\' 6"  (1.676 m), weight 58.1 kg, SpO2 96 %.  the patient is alert and pleasant.  She is moving her lower extremities well.  Disposition:   Home  Discharge Instructions     Remove dressing in 72 hours   Complete by: As directed    Call MD for:  difficulty breathing, headache or visual disturbances   Complete by: As directed    Call MD for:  extreme fatigue   Complete by: As directed    Call MD for:  hives   Complete by: As directed    Call MD for:  persistant dizziness or light-headedness   Complete by: As directed    Call MD for:  persistant nausea and vomiting   Complete by: As directed    Call MD for:  redness, tenderness, or signs of infection (pain, swelling, redness, odor or green/yellow discharge around incision site)   Complete by: As directed    Call MD for:  severe uncontrolled pain   Complete by: As directed    Call MD for:  temperature >100.4   Complete by: As directed    Diet - low sodium heart healthy   Complete by: As directed    Discharge  instructions   Complete by: As directed    Call [phone] for a followup appointment. Take a stool softener while you are using pain medications.   Driving Restrictions   Complete by: As directed    Do not drive for 2 weeks.   Increase activity slowly   Complete by: As directed    Lifting restrictions   Complete by: As directed    Do not lift more than 5 pounds. No excessive bending or twisting.   May shower / Bathe   Complete by: As directed    Remove the dressing for 3 days after surgery.  You may shower, but leave the incision alone.     Allergies as of 12/10/2018      Reactions   Digoxin Other (See Comments)   Statins    myalgia      Medication List    STOP taking these medications   calcitonin (salmon) 200 UNIT/ACT nasal spray Commonly known as: Miacalcin     TAKE these medications   aspirin 81 MG tablet Take 81 mg by mouth daily.   aspirin-acetaminophen-caffeine 250-250-65 MG tablet Commonly known as: EXCEDRIN MIGRAINE Take 2 tablets by mouth every 6 (six) hours as needed for headache.   diphenhydramine-acetaminophen 25-500 MG Tabs tablet Commonly known as: TYLENOL PM Take 1 tablet by mouth at bedtime as needed (sleep).   docusate sodium 100 MG capsule Commonly known as: COLACE Take 100 mg  by mouth daily.   enoxaparin 60 MG/0.6ML injection Commonly known as: LOVENOX Inject 0.6 mLs (60 mg total) into the skin every 12 (twelve) hours.   ezetimibe 10 MG tablet Commonly known as: ZETIA Take 10 mg by mouth daily.   ferrous sulfate 325 (65 FE) MG tablet Take 325 mg by mouth 2 (two) times a day.   furosemide 40 MG tablet Commonly known as: LASIX TAKE 1 TABLET BY MOUTH  DAILY   HYDROcodone-acetaminophen 5-325 MG tablet Commonly known as: NORCO/VICODIN Take 1-2 tablets by mouth every 4 (four) hours as needed for moderate pain.   ibandronate 150 MG tablet Commonly known as: BONIVA Take 1 tablet (150 mg total) by mouth every 30 (thirty) days. In AM w.  full glass of h20 and do not take anything else by mouth for 60 mins   levothyroxine 112 MCG tablet Commonly known as: SYNTHROID TAKE 1 TABLET BY MOUTH  DAILY What changed: when to take this   Magnesium 250 MG Tabs Take 250 mg by mouth daily.   metoprolol succinate 50 MG 24 hr tablet Commonly known as: TOPROL-XL Take 50 mg by mouth 2 (two) times daily.   Multi-Vitamin Daily Tabs Take 1 tablet by mouth daily.   potassium chloride SA 20 MEQ tablet Commonly known as: K-DUR TAKE 1 TABLET BY MOUTH  DAILY   spironolactone 25 MG tablet Commonly known as: ALDACTONE Take 25 mg by mouth 2 (two) times daily.   warfarin 5 MG tablet Commonly known as: COUMADIN Take as directed. If you are unsure how to take this medication, talk to your nurse or doctor. Original instructions: Take 1 tablet daily except 1 1/2 tablets on Fridays or AS DIRECTED BY ANTICOAGULATION CLINIC 90 DAY What changed:   how much to take  how to take this  when to take this  additional instructions      Follow-up Information    Newman Pies, MD Follow up in 2 week(s).   Specialty: Neurosurgery Contact information: [address] [address] [place] [address] [phone]           Signed: Ophelia Charter 12/10/2018, 6:37 PM

## 2018-12-10 NOTE — Evaluation (Signed)
Occupational Therapy Evaluation Patient Details Name: Grace [name] MRN: [id number] DOB: December 12, 1950 Today's Date: 12/10/2018    History of Present Illness 68 yo female with onset of spinal pain with failed conservative measures was admitted for kyphoplasty of T6, T7 on 7/16.  PMHx:  cardiac valvular disease, coumadin, breast CA, CKD, CHF, CAD, Hodgkins disease, mitral Valve replacement, migraines, pericarditis, TIA, [name] Parkinson's syndrome   Clinical Impression   This 68 y/o female presents with the above. PTA pt reports independence with ADL, iADL and functional mobility. Pt performing mobility within room using RW, demonstrating LB ADL at [facility] assist level. Pt with decreased recall of back precautions, but with back handout issued and utilizing resource during session when cued for recall. Pt reports she will return home with spouse assist for ADL/iADL needs. Will continue to follow while she remains in acute setting to maximize her safety and independence with ADL and mobility.     Follow Up Recommendations  No OT follow up;Supervision/Assistance - 24 hour(24hr initially)    Equipment Recommendations  None recommended by OT           Precautions / Restrictions Precautions Precautions: Back Precaution Booklet Issued: Yes (comment) Precaution Comments: reviewed throughout session Restrictions Weight Bearing Restrictions: No      Mobility Bed Mobility Overal bed mobility: Needs Assistance Bed Mobility: Sidelying to Sit;Rolling Rolling: Supervision Sidelying to sit: Supervision     Sit to sidelying: Min assist General bed mobility comments: pt requires cues to perform log roll technique but is able to perform easily and without assist, completed from flat bed, use of bedrail  Transfers Overall transfer level: Needs assistance Equipment used: Rolling walker (2 wheeled);1 person hand held assist Transfers: Sit to/from Stand Sit to Stand: Min guard          General transfer comment: cues for hand placement; minguard for safety    Balance Overall balance assessment: Needs assistance Sitting-balance support: Feet supported Sitting balance-Leahy Scale: Good     Standing balance support: Bilateral upper extremity supported;No upper extremity supported Standing balance-Leahy Scale: Fair Standing balance comment: pt able to statically stand without UE support and minguard assist                            ADL either performed or assessed with clinical judgement   ADL Overall ADL's : Needs assistance/impaired Eating/Feeding: Modified independent;Sitting   Grooming: Set up;Sitting   Upper Body Bathing: Set up;Sitting   Lower Body Bathing: Min guard;Sit to/from stand   Upper Body Dressing : Set up;Sitting   Lower Body Dressing: Min guard;Sit to/from stand Lower Body Dressing Details (indicate cue type and reason): pt easily able to perform figure 4 technique to adjust socks while seated EOB Toilet Transfer: Min guard;Ambulation;RW Toilet Transfer Details (indicate cue type and reason): simulated via transfer to/from EOB, room level mobility Toileting- Clothing Manipulation and Hygiene: Min guard;Sit to/from Nurse, children's Details (indicate cue type and reason): verbally reviewed safe transfer techniques for walk in shower; educated on using shower chair initially for bathing ADL to increase safety and for increased ease of adhering to precautions Functional mobility during ADLs: Min guard;Rolling walker                           Pertinent Vitals/Pain Pain Assessment: No/denies pain Faces Pain Scale: No hurt Pain Location: no complaints     Hand  Dominance Right   Extremity/Trunk Assessment Upper Extremity Assessment Upper Extremity Assessment: Overall WFL for tasks assessed   Lower Extremity Assessment Lower Extremity Assessment: Defer to PT evaluation   Cervical / Trunk Assessment Cervical  / Trunk Assessment: Other exceptions(new kyphoplasty)   Communication Communication Communication: No difficulties   Cognition Arousal/Alertness: Awake/alert Behavior During Therapy: Impulsive Overall Cognitive Status: No family/caregiver present to determine baseline cognitive functioning                                 General Comments: decreased STM unable to recall precautions reviewed in PT session, though suspect partly due to pt just having surgery   General Comments  pt was seen for mobility and strength testing, noted her recall of precautions was not there, provided written instructions and reviewed them for transfers and bed mob    Exercises Exercises: Other exercises(LE strength is [facility])   Shoulder Instructions      Home Living Family/patient expects to be discharged to:: Private residence Living Arrangements: Spouse/significant other Available Help at Discharge: Family;Available 24 hours/day Type of Home: House Home Access: Level entry     Home Layout: One level     Bathroom Shower/Tub: Tub/shower unit;Walk-in shower   Bathroom Toilet: Standard     Home Equipment: Cane - single point;Shower seat(cane is wooden and not hers)          Prior Functioning/Environment Level of Independence: Independent        Comments: was able to drive and do housework prior to spine pain; performing yard work        OT Problem List: Decreased strength;Decreased range of motion;Decreased activity tolerance;Decreased knowledge of use of DME or AE;Decreased knowledge of precautions;Impaired balance (sitting and/or standing)      OT Treatment/Interventions: Self-care/ADL training;Therapeutic exercise;Energy conservation;DME and/or AE instruction;Therapeutic activities;Patient/family education;Balance training    OT Goals(Current goals can be found in the care plan section) Acute Rehab OT Goals Patient Stated Goal: to walk and get home with husband; get back to  her yardwork OT Goal Formulation: With patient Time For Goal Achievement: 12/24/18 Potential to Achieve Goals: Good  OT Frequency: Min 2X/week   Barriers to D/C:            Co-evaluation              AM-PAC OT "6 Clicks" Daily Activity     Outcome Measure Help from another person eating meals?: None Help from another person taking care of personal grooming?: None Help from another person toileting, which includes using toliet, bedpan, or urinal?: None Help from another person bathing (including washing, rinsing, drying)?: None Help from another person to put on and taking off regular upper body clothing?: None Help from another person to put on and taking off regular lower body clothing?: A Little 6 Click Score: 23   End of Session Equipment Utilized During Treatment: Rolling walker Nurse Communication: Mobility status(pt sitting EOB)  Activity Tolerance: Patient tolerated treatment well Patient left: in bed;with call bell/phone within reach(seated EOB)  OT Visit Diagnosis: Other abnormalities of gait and mobility (R26.89)                Time: 3646-8032 OT Time Calculation (min): 19 min Charges:  OT General Charges $OT Visit: 1 Visit OT Evaluation $OT Eval Low Complexity: [place], OT Supplemental Rehabilitation Services Pager [phone] Office [phone]   Raymondo Band 12/10/2018, 3:28  PM

## 2018-12-10 NOTE — Anesthesia Postprocedure Evaluation (Signed)
Anesthesia Post Note  Patient: Grace [name]  Procedure(s) Performed: KYPHOPLASTY THORACIC SIX- THORACIC SEVEN (N/A )     Patient location during evaluation: PACU Anesthesia Type: General Level of consciousness: sedated Pain management: pain level controlled Vital Signs Assessment: post-procedure vital signs reviewed and stable Respiratory status: spontaneous breathing and respiratory function stable Cardiovascular status: stable Postop Assessment: no apparent nausea or vomiting Anesthetic complications: no    Last Vitals:  Vitals:   12/10/18 1237 12/10/18 1307  BP: (!) 97/53 (!) 110/58  Pulse: 93 96  Resp: 19 19  Temp:  36.6 C  SpO2: 96% 100%    Last Pain:  Vitals:   12/10/18 1307  TempSrc: Oral  PainSc:                  [name]

## 2018-12-10 NOTE — Discharge Instructions (Signed)
Balloon Kyphoplasty, Care After °This sheet gives you information about how to care for yourself after your procedure. Your health care provider may also give you more specific instructions. If you have problems or questions, contact your health care provider. °What can I expect after the procedure? °After your procedure, it is common to have back pain. °Follow these instructions at home: °Medicines °· Take over-the-counter and prescription medicines only as told by your health care provider. °· Ask your health care provider if the medicine prescribed to you: °? Requires you to avoid driving or using heavy machinery. °? Can cause constipation. You may need to take steps to prevent or treat constipation, such as: °§ Drink enough fluid to keep your urine pale yellow. °§ Take over-the-counter or prescription medicines. °§ Eat foods that are high in fiber, such as beans, whole grains, and fresh fruits and vegetables. °§ Limit foods that are high in fat and processed sugars, such as fried or sweet foods. °Puncture site care ° °· Follow instructions from your health care provider about how to take care of your puncture site. Make sure you: °? Wash your hands with soap and water before and after you change your bandage (dressing). If soap and water are not available, use hand sanitizer. °? Change your dressing as told by your health care provider. °? Leave skin glue or adhesive strips in place. These skin closures may need to be in place for 2 weeks or longer. If adhesive strip edges start to loosen and curl up, you may trim the loose edges. Do not remove adhesive strips completely unless your health care provider tells you to do that. °· Check your puncture site every day for signs of infection. Watch for: °? Redness, swelling, or pain. °? Fluid or blood. °? Warmth. °? Pus or a bad smell. °· Keep your dressing dry until your health care provider says that it can be removed. °Managing pain, stiffness, and swelling ° °· If  directed, put ice on the painful area. °? Put ice in a plastic bag. °? Place a towel between your skin and the bag. °? Leave the ice on for 20 minutes, 2-3 times a day. °Activity °· Rest your back and avoid intense physical activity for as long as told by your health care provider. °· Avoid bending, lifting, or twisting your back for as long as told by your health care provider. °· Return to your normal activities as told by your health care provider. Ask your health care provider what activities are safe for you. °· Do not lift anything that is heavier than 5 lb (2.2 kg). You may need to avoid heavy lifting for several weeks. °General instructions °· Do not use any products that contain nicotine or tobacco, such as cigarettes, e-cigarettes, and chewing tobacco. These can delay bone healing. If you need help quitting, ask your health care provider. °· Do not drive for 24 hours if you were given a sedative during your procedure. °· Keep all follow-up visits as told by your health care provider. This is important. °Contact a health care provider if: °· You have a fever or chills. °· You have redness, swelling, or pain at the site of your puncture. °· You have fluid, blood, or pus coming from the puncture site. °· You have pain that gets worse or does not get better with medicine. °· You develop numbness or weakness in any part of your body. °Get help right away if: °· You have chest pain. °·   You have difficulty breathing. °· You have weakness, numbness, or tingling in your legs. °· You cannot control your bladder or bowel movements. °· You suddenly become weak or numb on one side of your body. °· You become very confused. °· You have trouble speaking or understanding, or both. °Summary °· Follow instructions from your health care provider about how to take care of your puncture site. °· Take over-the-counter and prescription medicines only as told by your health care provider. °· Rest your back and avoid intense  physical activity for as long as told by your health care provider. °· Contact a health care provider if you have pain that gets worse or does not get better with medicine. °· Keep all follow-up visits as told by your health care provider. This is important. °This information is not intended to replace advice given to you by your health care provider. Make sure you discuss any questions you have with your health care provider. °Document Released: 02/01/2015 Document Revised: 04/20/2018 Document Reviewed: 04/20/2018 °Elsevier Patient Education © 2020 [employer] ° °

## 2018-12-11 ENCOUNTER — Telehealth: Payer: Self-pay | Admitting: General Practice

## 2018-12-11 ENCOUNTER — Encounter (HOSPITAL_COMMUNITY): Payer: Self-pay | Admitting: Neurosurgery

## 2018-12-11 NOTE — Telephone Encounter (Signed)
-----   Message from Marin Olp, MD sent at 12/11/2018 11:59 AM EDT ----- As we discussed -agree with once daily and inr check on Tuesday.   Thanks- you are amazing ! Garret Reddish   ----- Message ----- From: Warden Fillers, RN Sent: 12/11/2018  10:33 AM EDT To: Marin Olp, MD  Dr. [name],  Patient had Kyphoplasty yesterday.  This morning after taking a Lovenox injection she started to bleed from the incision sites.  She was very upset.  She did call the surgeon and he gave her instructions to hold pressure on the sites until it stopped.  What do you think about having her take the Lovenox just once a day instead of twice a day? AND of course re-start her coumadin as well? I know she does have mechanical valves but we don't want her to bleed either.  Your thoughts?  Jenny Reichmann

## 2018-12-11 NOTE — Telephone Encounter (Signed)
Incoming call from patient this morning.  Patient called to say she was bleeding from kyphoplasty incision site after she took Lovenox this morning.  She called her surgeon and he advised her to put pressure and ice on the site until it stopped. At the time of my conversation with patient the bleeding has all but stopped.  After consulting with Dr. [name] patient has been advised to take Lovenox only once a day along with coumadin.  Patient also advised to hold pressure on site if bleeding were to occur again.  Patient verbalized understanding and thanked me for the call.  Dr. [name] will follow up with patient this weekend.

## 2018-12-14 ENCOUNTER — Encounter (HOSPITAL_COMMUNITY): Payer: Self-pay | Admitting: Neurosurgery

## 2018-12-15 ENCOUNTER — Ambulatory Visit (INDEPENDENT_AMBULATORY_CARE_PROVIDER_SITE_OTHER): Payer: Medicare Other | Admitting: General Practice

## 2018-12-15 ENCOUNTER — Other Ambulatory Visit: Payer: Self-pay

## 2018-12-15 DIAGNOSIS — Z952 Presence of prosthetic heart valve: Secondary | ICD-10-CM | POA: Diagnosis not present

## 2018-12-15 DIAGNOSIS — Z7901 Long term (current) use of anticoagulants: Secondary | ICD-10-CM

## 2018-12-15 LAB — POCT INR: INR: 2.2 (ref 2.0–3.0)

## 2018-12-15 NOTE — Patient Instructions (Signed)
Pre visit review using our clinic review tool, if applicable. No additional management support is needed unless otherwise documented below in the visit note.  Take 1 1/2 tablets today and then continue to take 1 tablet daily except 1/2 tablet on Fridays.  Re-check in 4 weeks.

## 2018-12-17 ENCOUNTER — Other Ambulatory Visit: Payer: Self-pay | Admitting: Family Medicine

## 2018-12-25 ENCOUNTER — Other Ambulatory Visit (INDEPENDENT_AMBULATORY_CARE_PROVIDER_SITE_OTHER): Payer: Medicare Other

## 2018-12-25 DIAGNOSIS — D509 Iron deficiency anemia, unspecified: Secondary | ICD-10-CM | POA: Diagnosis not present

## 2018-12-25 LAB — IBC + FERRITIN
Ferritin: 85.9 ng/mL (ref 10.0–291.0)
Iron: 125 ug/dL (ref 42–145)
Saturation Ratios: 28.7 % (ref 20.0–50.0)
Transferrin: 311 mg/dL (ref 212.0–360.0)

## 2018-12-25 LAB — CBC
HCT: 39.5 % (ref 36.0–46.0)
Hemoglobin: 13 g/dL (ref 12.0–15.0)
MCHC: 33 g/dL (ref 30.0–36.0)
MCV: 91.8 fl (ref 78.0–100.0)
Platelets: 560 10*3/uL — ABNORMAL HIGH (ref 150.0–400.0)
RBC: 4.31 Mil/uL (ref 3.87–5.11)
RDW: 15.2 % (ref 11.5–15.5)
WBC: 14.8 10*3/uL — ABNORMAL HIGH (ref 4.0–10.5)

## 2018-12-28 ENCOUNTER — Telehealth: Payer: Self-pay

## 2018-12-28 NOTE — Telephone Encounter (Signed)
-----   Message from Irving Copas., MD sent at 12/25/2018  5:01 PM EDT ----- Regarding: Follow up Grace [name], Let's shoot for Clinic visit at end of this month or early next month. Thanks. GM

## 2018-12-28 NOTE — Telephone Encounter (Signed)
appt made for 01/07/19 at 1010 am with Dr Rush Landmark  The pt has been advised

## 2018-12-29 ENCOUNTER — Encounter: Payer: Self-pay | Admitting: Internal Medicine

## 2019-01-05 ENCOUNTER — Ambulatory Visit: Payer: Medicare Other

## 2019-01-07 ENCOUNTER — Ambulatory Visit (INDEPENDENT_AMBULATORY_CARE_PROVIDER_SITE_OTHER): Payer: Medicare Other | Admitting: Gastroenterology

## 2019-01-07 ENCOUNTER — Telehealth: Payer: Self-pay

## 2019-01-07 ENCOUNTER — Encounter: Payer: Self-pay | Admitting: Gastroenterology

## 2019-01-07 ENCOUNTER — Other Ambulatory Visit: Payer: Self-pay

## 2019-01-07 VITALS — BP 102/60 | HR 89 | Temp 98.0°F | Ht 66.0 in | Wt 127.0 lb

## 2019-01-07 DIAGNOSIS — D509 Iron deficiency anemia, unspecified: Secondary | ICD-10-CM

## 2019-01-07 DIAGNOSIS — M545 Low back pain, unspecified: Secondary | ICD-10-CM

## 2019-01-07 DIAGNOSIS — Z7901 Long term (current) use of anticoagulants: Secondary | ICD-10-CM

## 2019-01-07 DIAGNOSIS — Z9229 Personal history of other drug therapy: Secondary | ICD-10-CM | POA: Diagnosis not present

## 2019-01-07 MED ORDER — PEG 3350-KCL-NA BICARB-NACL 420 G PO SOLR: 4000.0000 mL | Freq: Once | ORAL | 0 refills | Status: AC

## 2019-01-07 NOTE — Progress Notes (Addendum)
[facility] VISIT   Primary Care Provider Marin Olp, [address] [place] [address] [phone]  Patient Profile: Grace [name] is a 68 y.o. female with a pmh significant for CHFrEF, Breast cancer survivor, AVR/MVR (on Coumadin 2.5-3.5), prior Hodgkin's Lymphoma (s/p Radiation & Chemo), Hypothyroidism, HLD, Iron deficiency anemia (s/p Iron supplementation), vertebral fracture (s/p Kyphoplasty).  The patient presents to the [facility] for an evaluation and management of problem(s) noted below:  Problem List 1. Iron deficiency anemia, unspecified iron deficiency anemia type   2. Hx of long-term (current) use of anticoagulants   3. Acute left-sided low back pain without sciatica     History of Present Illness: Please see initial consultation note from [facility] and my subsequent follow-up notes for full details of HPI.    Interval History The patient from a GI perspective has been doing well overall.  We reviewed her recent blood count and iron indices showing a stable iron saturation greater than 16%.  Stools are darker as a result of her iron supplementation.  We held off on her IV iron infusion due to patient preference.  She is dealing with multiple issues including back pain and also is in the process of trying to be evaluated at [facility] due to her prior history of multiple oncologic cancers and previous radiation treatments.  There is plan for possible kyphoplasty.  She has no abdominal pain.  She denies any nausea or vomiting.  Interval History The patient has been doing well since our last visit.  She underwent her kyphoplasty and only had some minor bleeding requiring Lovenox to be held for 1 day post her procedure but otherwise has had improvement in her discomfort in her back.  However yesterday, she had significant sneezing and coughing to the point where she has felt like she may have caused some muscle  soreness in her back and will be discussing this with her primary providers moving forward.  From the standpoint in regards to her iron deficiency, her iron saturations have look fantastic and in July when we rechecked her labs her iron saturation was 29% and her hemoglobin had normalized at 13.  She recently also saw her cardiologist and had an echocardiogram performed and was told that she is good to proceed with endoscopic evaluation if still deemed necessary.  The patient has no GI complaints currently as noted below.  She has darkened stool from her iron supplementation but not overt melena or hematochezia.  GI Review of Systems Positive as above Negative for dysphagia, odynophagia, nausea, vomiting, abdominal pain, change in bowel habits  Review of Systems General: Denies fevers/chills Cardiovascular: Denies chest pain Pulmonary: Denies shortness of breath Gastroenterological: See HPI Genitourinary: Denies darkened urine or hematuria Hematological: Bruising/bleeding remains stable due to Coumadin use Dermatological: Denies jaundice Musculoskeletal: Discomfort in left lower back Psychological: Mood is anxious about her medical issues starting to pile on   Medications Current Outpatient Medications  Medication Sig Dispense Refill   aspirin 81 MG tablet Take 81 mg by mouth daily.       aspirin-acetaminophen-caffeine (EXCEDRIN MIGRAINE) 250-250-65 MG tablet Take 2 tablets by mouth every 6 (six) hours as needed for headache.     diphenhydramine-acetaminophen (TYLENOL PM) 25-500 MG TABS tablet Take 1 tablet by mouth at bedtime as needed (sleep).      docusate sodium (COLACE) 100 MG capsule Take 100 mg by mouth daily.      enoxaparin (LOVENOX) 60 MG/0.6ML injection  Inject 0.6 mLs (60 mg total) into the skin every 12 (twelve) hours. 13 mL 0   ferrous sulfate 325 (65 FE) MG tablet Take 325 mg by mouth 2 (two) times a day.      furosemide (LASIX) 40 MG tablet TAKE 1 TABLET BY MOUTH   DAILY (Patient taking differently: Take 40 mg by mouth daily. ) 90 tablet 1   ibandronate (BONIVA) 150 MG tablet Take 1 tablet (150 mg total) by mouth every 30 (thirty) days. In AM w. full glass of h20 and do not take anything else by mouth for 60 mins 3 tablet 3   levothyroxine (SYNTHROID) 112 MCG tablet TAKE 1 TABLET BY MOUTH  DAILY (Patient taking differently: Take 112 mcg by mouth daily before breakfast. ) 90 tablet 3   Magnesium 250 MG TABS Take 250 mg by mouth daily.      metoprolol (TOPROL-XL) 50 MG 24 hr tablet Take 50 mg by mouth 2 (two) times daily.      Multiple Vitamin (MULTI-VITAMIN DAILY) TABS Take 1 tablet by mouth daily.      potassium chloride SA (K-DUR) 20 MEQ tablet TAKE 1 TABLET BY MOUTH  DAILY 90 tablet 1   spironolactone (ALDACTONE) 25 MG tablet Take 25 mg by mouth 2 (two) times daily.      warfarin (COUMADIN) 5 MG tablet Take 1 tablet daily except 1 1/2 tablets on Fridays or AS DIRECTED BY ANTICOAGULATION CLINIC 90 DAY (Patient taking differently: Take 2.5-5 mg by mouth See admin instructions. Take 5 mg by mouth daily except Fridays take 2.5 mg) 100 tablet 1   ezetimibe (ZETIA) 10 MG tablet Take 10 mg by mouth daily.      No current facility-administered medications for this visit.     Allergies Allergies  Allergen Reactions   Digoxin Other (See Comments)   Statins     myalgia    Histories Past Medical History:  Diagnosis Date   Anemia    Aortic insufficiency    AORTIC VALVE REPLACEMENT, HX OF 04/27/2007   Qualifier: Diagnosis of  By: Leanne Chang MD, Bruce     Cancer [facility])    left breast   CHF (congestive heart failure) ([place])    Chronic kidney disease    Congestive heart failure ([place]) 08/03/2009   [facility] Dr. Paul Half. Last echo 2010 with EF 30%. Restrictive due to radiation. AVR MVR. 02/2014 visit planned.     Coronary artery disease    11/06/06 [facility]): 40% oLM, 60% dLAD, 30% oRCA   Dysrhythmia    paroxsymal a-flutter 2018; intermittent left BBB  (2016)   GI bleed    Hodgkin's disease    ~1974, s/p [place] field radiation   Hyperlipidemia    Hypothyroidism    Migraine    MITRAL VALVE REPLACEMENT, HX OF 04/27/2007   Qualifier: Diagnosis of  By: Leanne Chang MD, Bruce     Pericarditis    s/p pericardidal stripping through lateral thoracotomy 01/2007 [facility])   TIA (transient ischemic attack)    Tricuspid regurgitation    WPW (Wolff-Parkinson-White syndrome)    Past Surgical History:  Procedure Laterality Date   AORTIC VALVE SURGERY     19 mm mechanical [employer] AVR 05/2006 ([place], Dr. Lajuana Matte)   bilateral breast implants     ENDOMETRIAL ABLATION     EXPLORATORY LAPAROTOMY     KYPHOPLASTY N/A 12/10/2018   Procedure: KYPHOPLASTY THORACIC SIX- THORACIC SEVEN;  Surgeon: Newman Pies, MD;  Location: [facility];  Service: Neurosurgery;  Laterality: N/A;  KYPHOPLASTY THORACIC SIX- THORACIC SEVEN   MASTECTOMY     MITRAL VALVE REPLACEMENT     25 mm [employer] MVR, 05/2006, Dr. Lajuana Matte, [facility]   pericardial stripping  9/08   SPLENECTOMY     ~ [place]     for pericardial stripping 01/2007 (Dr. Elenor Quinones, [facility])   [place]     valvulopathy  06/13/06   Social History   Socioeconomic History   Marital status: Married    Spouse name: Not on file   Number of children: Not on file   Years of education: Not on file   Highest education level: Not on file  Occupational History   Not on file  Social Needs   Financial resource strain: Not on file   Food insecurity    Worry: Not on file    Inability: Not on file   Transportation needs    Medical: Not on file    Non-medical: Not on file  Tobacco Use   Smoking status: Never Smoker   Smokeless tobacco: Never Used  Substance and Sexual Activity   Alcohol use: No   Drug use: No   Sexual activity: Not on file  Lifestyle   Physical activity    Days per week: Not on file    Minutes per session: Not on file   Stress:  Not on file  Relationships   Social connections    Talks on phone: Not on file    Gets together: Not on file    Attends religious service: Not on file    Active member of club or organization: Not on file    Attends meetings of clubs or organizations: Not on file    Relationship status: Not on file   Intimate partner violence    Fear of current or ex partner: Not on file    Emotionally abused: Not on file    Physically abused: Not on file    Forced sexual activity: Not on file  Other Topics Concern   Not on file  Social History Narrative   Lived in [place] for 18 years. Moved here for the weather.    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      Husband retired January 2015. Married 1991 (2nd marriage). No kids.       Hobbies: walks 2 miles per day, read, work outside         Family History  Problem Relation Age of Onset   Heart disease Mother    Cancer Mother        breast   Heart disease Maternal Aunt    Cancer Maternal Aunt        uterine   Heart disease Maternal Grandmother    Diabetes Maternal Grandmother    Hypercalcemia Neg Hx    Colon cancer Neg Hx    Esophageal cancer Neg Hx    Liver disease Neg Hx    Pancreatic cancer Neg Hx    Rectal cancer Neg Hx    Stomach cancer Neg Hx    I have reviewed her medical, social, and family history in detail and updated the electronic medical record as necessary.    PHYSICAL EXAMINATION  BP 102/60 (BP Location: Left Arm, Patient Position: Sitting, Cuff Size: Normal)    Pulse 89    Temp 98 F (36.7 C) (Oral)    Ht 5\' 6"  (1.676 m)  Wt 127 lb (57.6 kg)    SpO2 97%    BMI 20.50 kg/m  GEN: NAD, appears stated age, doesn't appear chronically ill PSYCH: Cooperative, without pressured speech EYE: Conjunctivae pink, sclerae anicteric ENT: MMM CV: Click with systolic murmur present, without rubs or gallops RESP: No adventitious breath sounds  present GI: NABS, soft, NT/ND, without rebound or guarding, no HSM appreciated MSK/EXT: Previous palpation at the thoracic vertebrae did not have discomfort, however she has pain upon deep palpation of her left lower back in the region of her muscles rather than her spine  SKIN: No jaundice, Band-Aids on top of upper back in setting of recent skin lesions that were worked on by dermatology NEURO:  Alert & Oriented x 3, no focal deficits   REVIEW OF DATA  I reviewed the following data at the time of this encounter:  GI Procedures and Studies  No relevant studies to review  Laboratory Studies  Reviewed in epic   Imaging Studies  No relevant studies to review   ASSESSMENT  Ms. Mungia is a 68 y.o. female with a pmh significant for CHFrEF, Breast cancer survivor, AVR/MVR (on Coumadin 2.5-3.5), prior Hodgkin's Lymphoma (s/p Radiation & Chemo), Hypothyroidism, HLD, Iron deficiency anemia (s/p Iron supplementation), vertebral fracture (s/p Kyphoplasty).  The patient is seen today for evaluation and management of:  1. Iron deficiency anemia, unspecified iron deficiency anemia type   2. Hx of long-term (current) use of anticoagulants   3. Acute left-sided low back pain without sciatica    From an iron deficiency perspective, the patient is doing well.  She has responded well to iron supplementation.  As result of how significant her anemia was, I do think it is still reasonable for [place] to consider an endoscopic evaluation even though things have improved that we can sure that we are not missing anything that could require treatment.  We have previously obtained the okay for Lovenox bridge for this patient understanding, that her risk of bleeding will be higher than an individual who does not get bridged.  We will proceed with her endoscopic evaluation in the hospital as per her cardiologist recommendations previously.  As her back pain is improved status post her kyphoplasty she is ready to move  forward with her procedures.  However, yesterday she had significant discomfort develop after a coughing and sneezing episode which seems to be musculoskeletal in origin based on today's exam but she will follow-up with her primary for further recommendations.  For now she will use some heating pads and cold pack to try to optimize things.  The risks and benefits of endoscopic evaluation were discussed with the patient; these include but are not limited to the risk of perforation, infection, bleeding, missed lesions, lack of diagnosis, severe illness requiring hospitalization, as well as anesthesia and sedation related illnesses.  The patient is agreeable to proceed.  All patient questions were answered, to the best of my ability, and the patient agrees to the aforementioned plan of action with follow-up as indicated.   PLAN  Twice daily oral iron supplementation  Proceed with scheduling EGD/colonoscopy with Lovenox bridge as per prior documentation notation (hospital-based on cardiology recommendation previously) CBC/iron/ferritin/TIBC to be drawn in 8 weeks Heat/cold compresses interchanged sporadically for low back pain Try to use Tylenol, however minimal NSAIDs as able but if needed okay to use as she has done previously (since she is already on anticoagulation)   Orders Placed This Encounter  Procedures   CBC  IBC + Ferritin   Ambulatory referral to Gastroenterology    New Prescriptions   No medications on file   Modified Medications   No medications on file    Planned Follow Up: No follow-ups on file.   Justice Britain, MD [facility]oscopy Office # [phone]

## 2019-01-07 NOTE — Telephone Encounter (Signed)
Good Afternoon,   Grace [name] is scheduled for  Colonoscopy + Enteroscopy on 02/22/2019 @ [facility]. Please see below.   Request for surgical clearance:     Endoscopy Procedure  What type of surgery is being performed?    Colonoscopy+ Enteroscopy   When is this surgery scheduled?     02/22/2019  What type of clearance is required ?   Pharmacy  Are there any medications that need to be held prior to surgery and how long? Hold Coumadin 5 days prior to procedure- Pt will require Lovenox Bridge?   Practice name and name of physician performing surgery?      [facility]- Dr. Justice Britain   What is your office phone and fax number?      Phone- [phone]  Fax- [phone]   Anesthesia type (None, local, MAC, general) ?       MAC  Thank you for your help.

## 2019-01-07 NOTE — Patient Instructions (Addendum)
Your provider has requested that you go to the basement level for lab work 2 weeks before your procedure. Press "B" on the elevator. The lab is located at the first door on the left as you exit the elevator.  You have been scheduled for an endoscopy. Please follow written instructions given to you at your visit today. If you use inhalers (even only as needed), please bring them with you on the day of your procedure.     Thank you for choosing me and [facility].  Dr. Rush Landmark

## 2019-01-08 DIAGNOSIS — M545 Low back pain, unspecified: Secondary | ICD-10-CM | POA: Insufficient documentation

## 2019-01-12 ENCOUNTER — Ambulatory Visit: Payer: Medicare Other

## 2019-01-12 ENCOUNTER — Other Ambulatory Visit: Payer: Self-pay

## 2019-01-12 ENCOUNTER — Ambulatory Visit (INDEPENDENT_AMBULATORY_CARE_PROVIDER_SITE_OTHER): Payer: Medicare Other | Admitting: General Practice

## 2019-01-12 DIAGNOSIS — Z952 Presence of prosthetic heart valve: Secondary | ICD-10-CM

## 2019-01-12 DIAGNOSIS — Z7901 Long term (current) use of anticoagulants: Secondary | ICD-10-CM

## 2019-01-12 LAB — POCT INR: INR: 4.1 — AB (ref 2.0–3.0)

## 2019-01-12 NOTE — Patient Instructions (Addendum)
Pre visit review using our clinic review tool, if applicable. No additional management support is needed unless otherwise documented below in the visit note.  Hold coumadin today and then continue to take 1 tablet daily except 1/2 tablet on Fridays.  Re-check in 3 weeks.

## 2019-01-22 ENCOUNTER — Other Ambulatory Visit: Payer: Self-pay

## 2019-01-26 ENCOUNTER — Ambulatory Visit (INDEPENDENT_AMBULATORY_CARE_PROVIDER_SITE_OTHER): Payer: Medicare Other | Admitting: Internal Medicine

## 2019-01-26 ENCOUNTER — Encounter: Payer: Self-pay | Admitting: Internal Medicine

## 2019-01-26 ENCOUNTER — Other Ambulatory Visit: Payer: Self-pay

## 2019-01-26 VITALS — BP 128/82 | HR 80 | Ht 66.0 in | Wt 127.0 lb

## 2019-01-26 DIAGNOSIS — M4854XA Collapsed vertebra, not elsewhere classified, thoracic region, initial encounter for fracture: Secondary | ICD-10-CM

## 2019-01-26 DIAGNOSIS — Z952 Presence of prosthetic heart valve: Secondary | ICD-10-CM | POA: Diagnosis not present

## 2019-01-26 DIAGNOSIS — M810 Age-related osteoporosis without current pathological fracture: Secondary | ICD-10-CM

## 2019-01-26 NOTE — Patient Instructions (Addendum)
Please continue multivitamin and Boniva.  Please stop at the lab.  Try to get approx. 1000-1200 mg calcium from the diet preferentially.  Try to get approx. 50 g proteins a day.  Please come back for a follow-up appointment in 6 months.  How Can I Prevent Falls? Men and women with osteoporosis need to take care not to fall down. Falls can break bones. Some reasons people fall are: Poor vision  Poor balance  Certain diseases that affect how you walk  Some types of medicine, such as sleeping pills.  Some tips to help prevent falls outdoors are: Use a cane or walker  Wear rubber-soled shoes so you don't slip  Walk on grass when sidewalks are slippery  In winter, put salt or kitty litter on icy sidewalks.  Some ways to help prevent falls indoors are: Keep rooms free of clutter, especially on floors  Use plastic or carpet runners on slippery floors  Wear low-heeled shoes that provide good support  Do not walk in socks, stockings, or slippers  Be sure carpets and area rugs have skid-proof backs or are tacked to the floor  Be sure stairs are well lit and have rails on both sides  Put grab bars on bathroom walls near tub, shower, and toilet  Use a rubber bath mat in the shower or tub  Keep a flashlight next to your bed  Use a sturdy step stool with a handrail and wide steps  Add more lights in rooms (and night lights) Buy a cordless phone to keep with you so that you don't have to rush to the phone       when it rings and so that you can call for help if you fall.   (adapted from http://www.niams.Nightlife[employer]views.se)  Exercise for Strong Bones (from [employer] [employer]) There are two types of exercises that are important for building and maintaining bone density:  weight-bearing and muscle-strengthening exercises. Weight-bearing Exercises These exercises include activities that make you move against gravity while staying upright.  Weight-bearing exercises can be high-impact or low-impact. High-impact weight-bearing exercises help build bones and keep them strong. If you have broken a bone due to osteoporosis or are at risk of breaking a bone, you may need to avoid high-impact exercises. If youre not sure, you should check with your healthcare provider. Examples of high-impact weight-bearing exercises are:  Dancing  Doing high-impact aerobics  Hiking  Jogging/running  Jumping Rope  Stair climbing  Tennis Low-impact weight-bearing exercises can also help keep bones strong and are a safe alternative if you cannot do high-impact exercises. Examples of low-impact weight-bearing exercises are:  Using elliptical training machines  Doing low-impact aerobics  Using stair-step machines  Fast walking on a treadmill or outside Muscle-Strengthening Exercises These exercises include activities where you move your body, a weight or some other resistance against gravity. They are also known as resistance exercises and include:  Lifting weights  Using elastic exercise bands  Using weight machines  Lifting your own body weight  Functional movements, such as standing and rising up on your toes Yoga and Pilates can also improve strength, balance and flexibility. However, certain positions may not be safe for people with osteoporosis or those at increased risk of broken bones. For example, exercises that have you bend forward may increase the chance of breaking a bone in the spine. A physical therapist should be able to help you learn which exercises are safe and appropriate for you. Non-Impact Exercises Non-impact exercises can help  you to improve balance, posture and how well you move in everyday activities. These exercises can also help to increase muscle strength and decrease the risk of falls and broken bones. Some of these exercises include:  Balance exercises that strengthen your legs and test your balance, such  as Tai Chi, can decrease your risk of falls.  Posture exercises that improve your posture and reduce rounded or sloping shoulders can help you decrease the chance of breaking a bone, especially in the spine.  Functional exercises that improve how well you move can help you with everyday activities and decrease your chance of falling and breaking a bone. For example, if you have trouble getting up from a chair or climbing stairs, you should do these activities as exercises. A physical therapist can teach you balance, posture and functional exercises. Starting a New Exercise Program If you havent exercised regularly for a while, check with your healthcare provider before beginning a new exercise program--particularly if you have health problems such as heart disease, diabetes or high blood pressure. If youre at high risk of breaking a bone, you should work with a physical therapist to develop a safe exercise program. Once you have your healthcare providers approval, start slowly. If youve already broken bones in the spine because of osteoporosis, be very careful to avoid activities that require reaching down, bending forward, rapid twisting motions, heavy lifting and those that increase your chance of a fall. As you get started, your muscles may feel sore for a day or two after you exercise. If soreness lasts longer, you may be working too hard and need to ease up. Exercises should be done in a pain-free range of motion. How Much Exercise Do You Need? Weight-bearing exercises 30 minutes on most days of the week. Do a 30-minutesession or multiple sessions spread out throughout the day. The benefits to your bones are the same.   Muscle-strengthening exercises Two to three days per week. If you dont have much time for strengthening/resistance training, do small amounts at a time. You can do just one body part each day. For example do arms one day, legs the next and trunk the next. You can also spread  these exercises out during your normal day.  Balance, posture and functional exercises Every day or as often as needed. You may want to focus on one area more than the others. If you have fallen or lose your balance, spend time doing balance exercises. If you are getting rounded shoulders, work more on posture exercises. If you have trouble climbing stairs or getting up from the couch, do more functional exercises. You can also perform these exercises at one time or spread them during your day. Work with a phyiscal therapist to learn the right exercises for you.    Denosumab: Patient drug information (Up-to-date) Copyright [phone] [place] rights reserved.  Brand Names: [place]  ProliaDelton See What is this drug used for?  It is used to treat soft, brittle bones (osteoporosis).  It is used for bone growth.  It is used when treating some cancers.  It may be given to you for other reasons. Talk with the doctor. What do I need to tell my doctor BEFORE I take this drug?  All products:  If you have an allergy to denosumab or any other part of this drug.  If you are allergic to any drugs like this one, any other drugs, foods, or other substances. Tell your doctor about the allergy and  what signs you had, like rash; hives; itching; shortness of breath; wheezing; cough; swelling of face, lips, tongue, or throat; or any other signs.  If you have low calcium levels.  Prolia:  If you are pregnant or may be pregnant. Do not take this drug if you are pregnant.  This is not a list of all drugs or health problems that interact with this drug.  Tell your doctor and pharmacist about all of your drugs (prescription or OTC, natural products, vitamins) and health problems. You must check to make sure that it is safe for you to take this drug with all of your drugs and health problems. Do not start, stop, or change the dose of any drug without checking with your doctor. What are some things I need  to know or do while I take this drug?  All products:  Tell dentists, surgeons, and other doctors that you use this drug.  This drug may raise the chance of a broken leg. Talk with your doctor.  Have your blood work checked. Talk with your doctor.  Have a bone density test. Talk with your doctor.  Take calcium and vitamin D as you were told by your doctor.  Have a dental exam before starting this drug.  Take good care of your teeth. See a dentist often.  If you smoke, talk with your doctor.  Do not give to a child. Talk with your doctor.  Tell your doctor if you are breast-feeding. You will need to talk about any risks to your baby.  Delton See:  This drug may cause harm to the unborn baby if you take it while you are pregnant. If you get pregnant while taking this drug, call your doctor right away.  Prolia:  Very bad infections have been reported with use of this drug. If you have any infection, are taking antibiotics now or in the recent past, or have many infections, talk with your doctor.  You may have more chance of getting an infection. Wash hands often. Stay away from people with infections, colds, or flu.  Use birth control that you can trust to prevent pregnancy while taking this drug.  If you are a man and your sex partner is pregnant or gets pregnant at any time while you are being treated, talk with your doctor. What are some side effects that I need to call my doctor about right away?  WARNING/CAUTION: Even though it may be rare, some people may have very bad and sometimes deadly side effects when taking a drug. Tell your doctor or get medical help right away if you have any of the following signs or symptoms that may be related to a very bad side effect:  All products:  Signs of an allergic reaction, like rash; hives; itching; red, swollen, blistered, or peeling skin with or without fever; wheezing; tightness in the chest or throat; trouble breathing or talking;  unusual hoarseness; or swelling of the mouth, face, lips, tongue, or throat.  Signs of low calcium levels like muscle cramps or spasms, numbness and tingling, or seizures.  Mouth sores.  Any new or strange groin, hip, or thigh pain.  This drug may cause jawbone problems. The chance may be higher the longer you take this drug. The chance may be higher if you have cancer, dental problems, dentures that do not fit well, anemia, blood clotting problems, or an infection. The chance may also be higher if you are having dental work or if you are getting  chemo, some steroid drugs, or radiation. Call your doctor right away if you have jaw swelling or pain.  Xgeva:  Not hungry.  Muscle pain or weakness.  Seizures.  Shortness of breath.  Prolia:  Signs of infection. These include a fever of 100.65F (38C) or higher, chills, very bad sore throat, ear or sinus pain, cough, more sputum or change in color of sputum, pain with passing urine, mouth sores, wound that will not heal, or anal itching or pain.  Signs of a pancreas problem (pancreatitis) like very bad stomach pain, very bad back pain, or very bad upset stomach or throwing up.  Chest pain.  A heartbeat that does not feel normal.  Very bad skin irritation.  Feeling very tired or weak.  Bladder pain or pain when passing urine or change in how much urine is passed.  Passing urine often.  Swelling in the arms or legs. What are some other side effects of this drug?  All drugs may cause side effects. However, many people have no side effects or only have minor side effects. Call your doctor or get medical help if any of these side effects or any other side effects bother you or do not go away:  Xgeva:  Feeling tired or weak.  Headache.  Upset stomach or throwing up.  Loose stools (diarrhea).  Cough.  Prolia:  Back pain.  Muscle or joint pain.  Sore throat.  Runny nose.  Pain in arms or legs.  These are not all of  the side effects that may occur. If you have questions about side effects, call your doctor. Call your doctor for medical advice about side effects.  You may report side effects to your national health agency. How is this drug best taken?  Use this drug as ordered by your doctor. Read and follow the dosing on the label closely.  It is given as a shot into the fatty part of the skin. What do I do if I miss a dose?  Call the doctor to find out what to do. How do I store and/or throw out this drug?  This drug will be given to you in a hospital or doctor's office. You will not store it at home.  Keep all drugs out of the reach of children and pets.  Check with your pharmacist about how to throw out unused drugs.  General drug facts  If your symptoms or health problems do not get better or if they become worse, call your doctor.  Do not share your drugs with others and do not take anyone else's drugs.  Keep a list of all your drugs (prescription, natural products, vitamins, OTC) with you. Give this list to your doctor.  Talk with the doctor before starting any new drug, including prescription or OTC, natural products, or vitamins.  Some drugs may have another patient information leaflet. If you have any questions about this drug, please talk with your doctor, pharmacist, or other health care provider.  If you think there has been an overdose, call your poison control center or get medical care right away. Be ready to tell or show what was taken, how much, and when it happened.

## 2019-01-26 NOTE — Progress Notes (Addendum)
Patient ID: Grace [name], female   DOB: Jun 11, 1950, 68 y.o.   MRN: [id number]    HPI  Grace [name] is a 68 y.o.-year-old female, referred by her Dr. Raeford Razor, for management of osteoporosis (OP) with history of vertebral compression fractures and also hypercalcemia.  Pt was dx with OP in 2014.  I reviewed pt's DXA scans: Date L1-L2 T score FN T score 33% distal Radius  03/14/2017  -1.2 (+2.7%) RFN: -2.3 (-2.1%) LFN: -1.5 (+5.7%*) n/a  03/13/2015  -1.4 RFN: -2.2 LFN: -1.9 n/a  02/08/2013  -2.5 RFN: -1.8 LFN: -1.7 n/a   Previous fractures: - sacroiliac insufficiency fracture 06/2018 - from cough-diagnosed by Dr. Gardenia Phlegm - T6 an T7 vertebrae 10/2018 - from picking up a slipper; s/p kyphoplasty 12/10/2018 >> pain almost entirely resolved after the procedure  No recent falls.   No dizziness/vertigo/orthostasis/poor vision.   Previous OP treatments:  -Boniva 150 mg/month -started 2014  No h/o vitamin D deficiency. Reviewed available vit D levels: Lab Results  Component Value Date   VD25OH 53.58 10/16/2018   VD25OH 58.08 12/03/2017   VD25OH 59.80 10/28/2017   VD25OH 52.49 06/26/2016   VD25OH 67.67 10/16/2015   VD25OH 70.08 01/17/2014   Pt is on: calcium , vitamin D >> takes a multivitamin daily. Stopped calcium supplement, but does not remember exactly when.  No weight bearing exercises.  She was walking on the treadmill and being active in the yard before her fx.   She does not take high vitamin A doses.  Menopause was at 68 y/o.   FH of osteoporosis: mother  - h/o vertebral fracture.  She has a history of intermittent hypercalcemia. No h/o kidney stones. Lab Results  Component Value Date   PTH 37 11/10/2018   PTH 25 10/16/2018   PTH 16 11/25/2017   CALCIUM 9.7 12/07/2018   CALCIUM 10.5 (H) 11/10/2018   CALCIUM 10.2 10/16/2018   CALCIUM 10.5 06/30/2018   CALCIUM 9.8 04/28/2018   CALCIUM 10.1 01/02/2018   CALCIUM 11.0 (H) 11/25/2017   CALCIUM 11.3 (H)  10/28/2017   CALCIUM 9.9 01/24/2017   CALCIUM 11.1 (H) 12/20/2016  11/10/2018: Ionized calcium 5.29 (4.8-5.6), for total calcium of 10.5 (8.6-10.4). 11/28/2017: ionized calcium 5.8 (4.8-5.6), or low calcium   No h/o thyrotoxicosis.  She is on levothyroxine 112 mcg daily.  Reviewed TSH recent levels:  Lab Results  Component Value Date   TSH 0.55 10/16/2018   TSH 1.02 04/28/2018   TSH 0.62 10/28/2017   TSH 0.51 12/20/2016   TSH 0.57 06/26/2016   + Mild CKD. Last BUN/Cr: Lab Results  Component Value Date   BUN 13 12/07/2018   CREATININE 1.05 (H) 12/07/2018   Expect only showed an increase alpha 1 protein, but not an M spike.  Of note, she has a history of Hodgkin lymphoma, s/p ChTx (MOPP -cardiovascular disease, lung fibrosis and hypothyroidism as complications) and RxTx - mantle radiation (Dr. Charlcie Cradle oncologist, Dr. Pilar Plate Batley-radiologist). She had L breast cancer (Dr. Nemiah Commander) >> started Tamoxifen >> increased bleeding >> endometrial ablation >> menopause 68 y/o. She also has a history of CHF (Dr. Venetia Maxon - [facility]), GI bleed (Dr Rush Landmark).  ROS: Constitutional: no weight gain, no weight loss, + fatigue, no subjective hyperthermia, no subjective hypothermia, no nocturia Eyes: no blurry vision, no xerophthalmia ENT: no sore throat, no nodules palpated in neck, no dysphagia, no odynophagia, no hoarseness, no tinnitus, no hypoacusis Cardiovascular: no CP, + SOB, no palpitations, + leg swelling  Respiratory: no cough, + SOB, no wheezing Gastrointestinal: no N, no V, no D, + C, no acid reflux Musculoskeletal: + Muscle, no joint aches Skin: no rash, no hair loss, + easy bruising Neurological: no tremors, no numbness or tingling/no dizziness/no HAs Psychiatric: no depression, no anxiety  I reviewed pt's medications, allergies, PMH, social hx, family hx, and changes were documented in the history of present illness. Otherwise, unchanged from my initial visit note.  Past  Medical History:  Diagnosis Date  . Anemia   . Aortic insufficiency   . AORTIC VALVE REPLACEMENT, HX OF 04/27/2007   Qualifier: Diagnosis of  By: Leanne Chang MD, Bruce    . Cancer ([place])    left breast  . CHF (congestive heart failure) ([place])   . Chronic kidney disease   . Congestive heart failure ([place]) 08/03/2009   [facility] Dr. Paul Half. Last echo 2010 with EF 30%. Restrictive due to radiation. AVR MVR. 02/2014 visit planned.    . Coronary artery disease    11/06/06 [facility]): 40% oLM, 60% dLAD, 30% oRCA  . Dysrhythmia    paroxsymal a-flutter 2018; intermittent left BBB (2016)  . GI bleed   . Hodgkin's disease    ~1974, s/p Mantle field radiation  . Hyperlipidemia   . Hypothyroidism   . Migraine   . MITRAL VALVE REPLACEMENT, HX OF 04/27/2007   Qualifier: Diagnosis of  By: Leanne Chang MD, Bruce    . Pericarditis    s/p pericardidal stripping through lateral thoracotomy 01/2007 [facility])  . TIA (transient ischemic attack)   . Tricuspid regurgitation   . WPW (Wolff-Parkinson-White syndrome)    Past Surgical History:  Procedure Laterality Date  . AORTIC VALVE SURGERY     19 mm mechanical [employer] AVR 05/2006 [facility], Dr. Lajuana Matte)  . bilateral breast implants    . ENDOMETRIAL ABLATION    . EXPLORATORY LAPAROTOMY    . KYPHOPLASTY N/A 12/10/2018   Procedure: KYPHOPLASTY THORACIC SIX- THORACIC SEVEN;  Surgeon: Newman Pies, MD;  Location: [place];  Service: Neurosurgery;  Laterality: N/A;  KYPHOPLASTY THORACIC SIX- THORACIC SEVEN  . MASTECTOMY    . MITRAL VALVE REPLACEMENT     25 mm [employer] MVR, 05/2006, Dr. Lajuana Matte, [facility]  . pericardial stripping  9/08  . SPLENECTOMY     ~ 1974  . SPLENECTOMY, TOTAL    . THORACOTOMY     for pericardial stripping 01/2007 (Dr. Elenor Quinones, [facility])  . TONSILLECTOMY    . valvulopathy  06/13/06   Social History   Socioeconomic History  . Marital status: Married    Spouse name: Not on file  . Number of children: 0  . Years of education: Not on file  . Highest  education level: Not on file  Occupational History  .  Retired  Scientific laboratory technician  . Financial resource strain: Not on file  . Food insecurity    Worry: Not on file    Inability: Not on file  . Transportation needs    Medical: Not on file    Non-medical: Not on file  Tobacco Use  . Smoking status: Never Smoker  . Smokeless tobacco: Never Used  Substance and Sexual Activity  . Alcohol use: No  . Drug use: No  . Sexual activity: Not on file  Lifestyle  . Physical activity    Days per week: Not on file    Minutes per session: Not on file  . Stress: Not on file  Relationships  . Social connections    Talks  on phone: Not on file    Gets together: Not on file    Attends religious service: Not on file    Active member of club or organization: Not on file    Attends meetings of clubs or organizations: Not on file    Relationship status: Not on file  . Intimate partner violence    Fear of current or ex partner: Not on file    Emotionally abused: Not on file    Physically abused: Not on file    Forced sexual activity: Not on file  Other Topics Concern  . Not on file  Social History Narrative   Lived in [place] since 1998. Moved here for the weather.    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      Husband retired January 2015. Married 1991 (2nd marriage). No kids.       Hobbies: walks 2 miles per day, read, work outside         Current Outpatient Medications on File Prior to Visit  Medication Sig Dispense Refill  . aspirin 81 MG tablet Take 81 mg by mouth daily.      Marland Kitchen aspirin-acetaminophen-caffeine (EXCEDRIN MIGRAINE) 250-250-65 MG tablet Take 2 tablets by mouth every 6 (six) hours as needed for headache.    . diphenhydramine-acetaminophen (TYLENOL PM) 25-500 MG TABS tablet Take 1 tablet by mouth at bedtime as needed (sleep).     . docusate sodium (COLACE) 100 MG capsule Take 100 mg by mouth daily.     Marland Kitchen enoxaparin  (LOVENOX) 60 MG/0.6ML injection Inject 0.6 mLs (60 mg total) into the skin every 12 (twelve) hours. 13 mL 0  . ezetimibe (ZETIA) 10 MG tablet Take 10 mg by mouth daily.     . ferrous sulfate 325 (65 FE) MG tablet Take 325 mg by mouth 2 (two) times a day.     . furosemide (LASIX) 40 MG tablet TAKE 1 TABLET BY MOUTH  DAILY (Patient taking differently: Take 40 mg by mouth daily. ) 90 tablet 1  . ibandronate (BONIVA) 150 MG tablet Take 1 tablet (150 mg total) by mouth every 30 (thirty) days. In AM w. full glass of h20 and do not take anything else by mouth for 60 mins 3 tablet 3  . levothyroxine (SYNTHROID) 112 MCG tablet TAKE 1 TABLET BY MOUTH  DAILY (Patient taking differently: Take 112 mcg by mouth daily before breakfast. ) 90 tablet 3  . Magnesium 250 MG TABS Take 250 mg by mouth daily.     . metoprolol (TOPROL-XL) 50 MG 24 hr tablet Take 50 mg by mouth 2 (two) times daily.     . Multiple Vitamin (MULTI-VITAMIN DAILY) TABS Take 1 tablet by mouth daily.     . potassium chloride SA (K-DUR) 20 MEQ tablet TAKE 1 TABLET BY MOUTH  DAILY 90 tablet 1  . spironolactone (ALDACTONE) 25 MG tablet Take 25 mg by mouth 2 (two) times daily.     Marland Kitchen warfarin (COUMADIN) 5 MG tablet Take 1 tablet daily except 1 1/2 tablets on Fridays or AS DIRECTED BY ANTICOAGULATION CLINIC 90 DAY (Patient taking differently: Take 2.5-5 mg by mouth See admin instructions. Take 5 mg by mouth daily except Fridays take 2.5 mg) 100 tablet 1   No current facility-administered medications on file prior to visit.    Allergies  Allergen Reactions  . Digoxin Other (See Comments)  . Statins     myalgia  Family History  Problem Relation Age of Onset  . Heart disease Mother   . Cancer Mother        breast  . Heart disease Maternal Aunt   . Cancer Maternal Aunt        uterine  . Heart disease Maternal Grandmother   . Diabetes Maternal Grandmother   . Hypercalcemia Neg Hx   . Colon cancer Neg Hx   . Esophageal cancer Neg Hx   .  Liver disease Neg Hx   . Pancreatic cancer Neg Hx   . Rectal cancer Neg Hx   . Stomach cancer Neg Hx     PE: BP 128/82   Pulse 80   Ht 5\' 6"  (1.676 m)   Wt 127 lb (57.6 kg)   SpO2 98%   BMI 20.50 kg/m  Wt Readings from Last 3 Encounters:  01/26/19 127 lb (57.6 kg)  01/07/19 127 lb (57.6 kg)  12/10/18 128 lb 1.6 oz (58.1 kg)   Constitutional: Normal weight, in NAD. No kyphosis. Eyes: PERRLA, EOMI, no exophthalmos ENT: moist mucous membranes, no thyromegaly, no cervical lymphadenopathy Cardiovascular: RRR, No MRG Respiratory: CTA B Gastrointestinal: abdomen soft, NT, ND, BS+ Musculoskeletal: no deformities, strength intact in all 4 Skin: moist, warm, no rashes Neurological: no tremor with outstretched hands, DTR normal in all 4  Assessment: 1. Osteoporosis  2.  Vertebral compression fractures (T6 and T7- 10/2018) - s/p kyphoplasty  3.  Hypercalcemia  Plan: 1. Osteoporosis and 2.  Vertebral compression fracture - likely postmenopausal/age-related + FH of OP + also possibly related to her mantle radiation treatment - Discussed about increased risk of fracture, depending on the T score, greatly increased when the T score is lower than -2.5, but it is actually a continuum and -2.5 should not be regarded as an absolute threshold. We reviewed her DXA scan report together, and I explained that based on the T scores and also particularly because of her history of vertebral compression fractures, she has an increased risk for fractures.  - we reviewed her dietary and supplemental calcium and vitamin D intake. I recommended to make sure she gets 1000-1200 mg of calcium daily preferentially from diet.  She does have a history of higher blood calcium levels (intermittently), so I would not recommend supplementation.  She is taking a multivitamin, which we will continue for now. - discussed fall precautions   - given handout from [place] Re: weight bearing  exercises - advised to do this every day or at least 5/7 days - we discussed about maintaining a good amount of protein in her diet. The recommended daily protein intake is ~0.8 g per kilogram per day (for her approximately 50 g/day). I advised her to try to aim for this amount, since a diet low in proteins can exacerbate osteoporosis. Also, avoid smoking or >2 drinks of alcohol a day. - We discussed about the different medication classes, benefits and side effects (including atypical fractures and ONJ - no dental workup in progress or planned).  - first options are sq denosumab (Prolia) sq every 6 months for 6-10 years and zoledronic acid (Reclast) iv 1x a year for 1-2 years.  I do not feel that she is a good candidate for Teriparatide/Abaloparatide due to history of radiation and also due to her hypercalcemia (which will need to be investigated if persistent off calcium supplement).  For now, due to her history of CAD and TIA, I will also not recommend Romosozumab.  Patient agrees  with Prolia. -I gave her reading information about Prolia, and I explained the mechanism of action and expected benefits.  - Will check the following labs today: Orders Placed This Encounter  Procedures  . BASIC METABOLIC PANEL WITH GFR  - if labs normal, will arrange for a Prolia inj - will check a new DXA scan in 2 years after starting Prolia, but she does have a bone density scan pending for next month, with which we can continue -  I explained that the first indication that the treatment is working is her not having anymore fractures. DXA scan changes are secondary: unchanged or slightly higher T-scores are desirable - will see pt back in 6 months  3.  Hypercalcemia -We discussed about the possibility of primary hyperparathyroidism but to investigate this further she needs to be off calcium.  She remembers taking calcium supplements in the past but does not remember exactly when she stopped.  Of note, her latest  calcium level was normal.  I would like to repeat a level today and we may need a parathyroid hormone level if calcium is elevated.  In that case, plan to do this investigation at next visit. -Her vitamin D level was recently normal so I advised her to continue her multivitamin for now.  Will not repeat a level today.  Component     Latest Ref Rng & Units 01/26/2019  Glucose     65 - 99 mg/dL 100 (H)  BUN     7 - 25 mg/dL 22  Creatinine     0.50 - 0.99 mg/dL 1.36 (H)  GFR, Est Non African American     > OR = 60 mL/min/1.50m2 40 (L)  GFR, Est African American     > OR = 60 mL/min/1.77m2 46 (L)  BUN/Creatinine Ratio     6 - 22 (calc) 16  Sodium     135 - 146 mmol/L 138  Potassium     3.5 - 5.3 mmol/L 4.6  Chloride     98 - 110 mmol/L 98  CO2     20 - 32 mmol/L 29  Calcium     8.6 - 10.4 mg/dL 10.5 (H)   Creatinine is higher than last check, but consistent with prior checks. Calcium slightly elevated.  I will advise her to stop the multivitamin but continues to take vitamin D- 1000 units daily.  We can go ahead with Prolia, as discussed.  Msg from pt: Thank you for the note about test results.  I looked up my records for taking Caltrate 600+ D.  Starting taking 03/2011, but was taking 1 or 2 tabs qd.  Must've been due to results of current labs.  Stopped taking it 10/2017.  I started taking Boniva 02/2013 & took last dose 01/2019.  I will stop the multivitamin & start taking an OTC Vitamin D 1000 units daily.    Philemon Kingdom, MD PhD [facility] Endocrinology

## 2019-01-27 ENCOUNTER — Encounter: Payer: Self-pay | Admitting: Internal Medicine

## 2019-01-27 LAB — BASIC METABOLIC PANEL WITH GFR
BUN/Creatinine Ratio: 16 (calc) (ref 6–22)
BUN: 22 mg/dL (ref 7–25)
CO2: 29 mmol/L (ref 20–32)
Calcium: 10.5 mg/dL — ABNORMAL HIGH (ref 8.6–10.4)
Chloride: 98 mmol/L (ref 98–110)
Creat: 1.36 mg/dL — ABNORMAL HIGH (ref 0.50–0.99)
GFR, Est African American: 46 mL/min/{1.73_m2} — ABNORMAL LOW (ref >=60)
GFR, Est Non African American: 40 mL/min/{1.73_m2} — ABNORMAL LOW (ref >=60)
Glucose, Bld: 100 mg/dL — ABNORMAL HIGH (ref 65–99)
Potassium: 4.6 mmol/L (ref 3.5–5.3)
Sodium: 138 mmol/L (ref 135–146)

## 2019-02-05 ENCOUNTER — Other Ambulatory Visit: Payer: Self-pay

## 2019-02-05 ENCOUNTER — Telehealth: Payer: Self-pay | Admitting: General Practice

## 2019-02-05 ENCOUNTER — Ambulatory Visit (INDEPENDENT_AMBULATORY_CARE_PROVIDER_SITE_OTHER): Payer: Medicare Other | Admitting: General Practice

## 2019-02-05 DIAGNOSIS — Z7901 Long term (current) use of anticoagulants: Secondary | ICD-10-CM | POA: Diagnosis not present

## 2019-02-05 LAB — POCT INR: INR: 4.7 — AB (ref 2.0–3.0)

## 2019-02-05 NOTE — Telephone Encounter (Signed)
Instructions for coumadin and Lovenox pre and post procedure on 9/28.  9/23 - Last dose of coumadin until after procedure  9/24 - Nothing (No coumadin or Lovenox) 9/25- Lovenox in the AM and PM (12 hours apart) 9/26- Lovenox in the AM and PM 9/27 - Lovenox in the AM only 9/28 - Procedure (NO LOVENOX TODAY) 9/29 - Lovenox in the AM and PM and 1 1/2 tablets of coumadin 9/30 - Lovenox in the AM and PM and 1 1/2 tablets of coumadin 10/1 - Lovenox in the AM and PM and 1 1/2 tablet of coumadin 10/2 - Re-check INR with Jenny Reichmann

## 2019-02-05 NOTE — Patient Instructions (Addendum)
Pre visit review using our clinic review tool, if applicable. No additional management support is needed unless otherwise documented below in the visit note.  Hold coumadin today and take 1/2 tablet (2.5 mg) tomorrow.  On Sunday change dosage and take 1 tablet daily except 1/2 tablet on Mondays and Fridays.  Re-check in 3 weeks.

## 2019-02-06 ENCOUNTER — Other Ambulatory Visit: Payer: Self-pay | Admitting: Family Medicine

## 2019-02-08 ENCOUNTER — Other Ambulatory Visit (INDEPENDENT_AMBULATORY_CARE_PROVIDER_SITE_OTHER): Payer: Medicare Other

## 2019-02-08 DIAGNOSIS — D509 Iron deficiency anemia, unspecified: Secondary | ICD-10-CM

## 2019-02-08 DIAGNOSIS — M545 Low back pain, unspecified: Secondary | ICD-10-CM

## 2019-02-08 LAB — CBC
HCT: 36.7 % (ref 36.0–46.0)
Hemoglobin: 11.9 g/dL — ABNORMAL LOW (ref 12.0–15.0)
MCHC: 32.4 g/dL (ref 30.0–36.0)
MCV: 91.2 fl (ref 78.0–100.0)
Platelets: 452 10*3/uL — ABNORMAL HIGH (ref 150.0–400.0)
RBC: 4.03 Mil/uL (ref 3.87–5.11)
RDW: 14.8 % (ref 11.5–15.5)
WBC: 10.6 10*3/uL — ABNORMAL HIGH (ref 4.0–10.5)

## 2019-02-08 LAB — IBC + FERRITIN
Ferritin: 85.1 ng/mL (ref 10.0–291.0)
Iron: 72 ug/dL (ref 42–145)
Saturation Ratios: 19.7 % — ABNORMAL LOW (ref 20.0–50.0)
Transferrin: 261 mg/dL (ref 212.0–360.0)

## 2019-02-09 ENCOUNTER — Other Ambulatory Visit: Payer: Self-pay

## 2019-02-09 ENCOUNTER — Telehealth: Payer: Self-pay | Admitting: Gastroenterology

## 2019-02-09 DIAGNOSIS — D509 Iron deficiency anemia, unspecified: Secondary | ICD-10-CM

## 2019-02-09 NOTE — Telephone Encounter (Signed)
Yes thanks- see below- sorry didn't send to you the first time

## 2019-02-09 NOTE — Telephone Encounter (Signed)
Yes thanks- thanks so much!

## 2019-02-09 NOTE — Telephone Encounter (Signed)
Pt states that she has been seen blood with every bm, she wanted to inform Dr. Rush Landmark because she is scheduled for a procedure on 9/28 and is not sure if this would affect her procedure. Pls call her.

## 2019-02-09 NOTE — Telephone Encounter (Signed)
9/21 9 am  9/28 9 am Pt scheduled at the pt care center The pt has been advised and will have labs 3 weeks after the last infusion.  Labs in [place]

## 2019-02-09 NOTE — Telephone Encounter (Signed)
Mansouraty, Telford Nab., MD  Timothy Lasso, RN        Grace [name],  I have released the patient's results on my chart.  She should know that her iron indices have dropped slightly from a month ago. I would like to move forward with enteroscopy and colonoscopy that are scheduled in the coming weeks.  I would also like to move forward with getting her to IV iron infusions (okay if it occurs after our procedures).  If she has any other questions please let us know.  3 weeks after her second IV iron infusion she needs repeat CBC/iron/TIBC/ferritin.  Thanks.  GM

## 2019-02-09 NOTE — Telephone Encounter (Signed)
Pt needs to keep appt for colon as planned and needs 2 IV iron infusions and follow up labs 3 weeks after last infusion. Left message on machine to call back

## 2019-02-10 ENCOUNTER — Telehealth: Payer: Self-pay | Admitting: General Practice

## 2019-02-10 NOTE — Telephone Encounter (Signed)
OK per Dr. [name] that pt has Lovenox bridge for upcoming procedure on 9/28.

## 2019-02-15 ENCOUNTER — Encounter (HOSPITAL_COMMUNITY): Payer: Medicare Other

## 2019-02-15 NOTE — Telephone Encounter (Signed)
-----   Message from Philemon Kingdom, MD sent at 01/27/2019 12:50 PM EDT ----- Colletta [place], Let's submit this pt to the portal for Prolia.  Thank you, C

## 2019-02-15 NOTE — Telephone Encounter (Signed)
LMTCB pt is clear for prolia, she has a $50 copay and is ready to be scheduled

## 2019-02-16 ENCOUNTER — Ambulatory Visit (HOSPITAL_COMMUNITY)
Admission: RE | Admit: 2019-02-16 | Discharge: 2019-02-16 | Disposition: A | Discharge status: Home / Self Care | Payer: Medicare Other | Source: Ambulatory Visit | Attending: Internal Medicine | Admitting: Internal Medicine

## 2019-02-16 ENCOUNTER — Other Ambulatory Visit: Payer: Self-pay

## 2019-02-16 DIAGNOSIS — D509 Iron deficiency anemia, unspecified: Secondary | ICD-10-CM | POA: Diagnosis present

## 2019-02-16 MED ORDER — SODIUM CHLORIDE 0.9 % IV SOLN: 510.0000 mg | INTRAVENOUS | Status: DC

## 2019-02-16 MED ORDER — SODIUM CHLORIDE 0.9 % IV SOLN
INTRAVENOUS | Status: DC | PRN
  Administered 2019-02-16: 250 mL via INTRAVENOUS

## 2019-02-16 MED ORDER — SODIUM CHLORIDE 0.9 % IV SOLN
510.0000 mg | INTRAVENOUS | Status: DC
  Administered 2019-02-16: 09:00:00 510 mg via INTRAVENOUS
  Filled 2019-02-16: qty 17

## 2019-02-16 NOTE — Progress Notes (Signed)
                   Patient Care Center Note   Diagnosis: Iron Deficiency Anemia   Provider:  Mansouraty, Telford Nab., MD   Procedure: IV Feraheme   Note: Received IV Feraheme via PIV. Tolerated well. Observed 30 min post-transfusion. Vitals stable. Discharge instruction given. Alert, oriented and ambulatory at discharge.   Otho Bellows, RN

## 2019-02-16 NOTE — Discharge Instructions (Signed)

## 2019-02-18 ENCOUNTER — Other Ambulatory Visit (HOSPITAL_COMMUNITY)
Admission: RE | Admit: 2019-02-18 | Discharge: 2019-02-18 | Disposition: A | Discharge status: Home / Self Care | Payer: Medicare Other | Source: Ambulatory Visit | Attending: Gastroenterology | Admitting: Gastroenterology

## 2019-02-18 DIAGNOSIS — Z20828 Contact with and (suspected) exposure to other viral communicable diseases: Secondary | ICD-10-CM | POA: Insufficient documentation

## 2019-02-18 DIAGNOSIS — Z01812 Encounter for preprocedural laboratory examination: Secondary | ICD-10-CM | POA: Diagnosis present

## 2019-02-19 ENCOUNTER — Encounter (HOSPITAL_COMMUNITY): Payer: Self-pay | Admitting: *Deleted

## 2019-02-19 ENCOUNTER — Other Ambulatory Visit: Payer: Self-pay

## 2019-02-19 LAB — NOVEL CORONAVIRUS, NAA (HOSP ORDER, SEND-OUT TO REF LAB; TAT 18-24 HRS): SARS-CoV-2, NAA: NOT DETECTED

## 2019-02-19 NOTE — Progress Notes (Signed)
Grace [name] denies chest pain or shortness of breath.  Patient had Covid test 02/18/2019 and has been in quarantine with her husband since that time.  Grace [name] reports that she does not have any questions regarding prep.

## 2019-02-21 NOTE — Anesthesia Preprocedure Evaluation (Addendum)
Anesthesia Evaluation  Patient identified by MRN, date of birth, ID band Patient awake    Reviewed: Allergy & Precautions, NPO status , Patient's Chart, lab work & pertinent test results  Airway Mallampati: II  TM Distance: >3 FB Neck ROM: Full    Dental no notable dental hx. (+) Teeth Intact, Dental Advisory Given   Pulmonary neg pulmonary ROS,    Pulmonary exam normal breath sounds clear to auscultation       Cardiovascular hypertension, + CAD and +CHF  Normal cardiovascular exam+ dysrhythmias (WPW) Atrial Fibrillation + Valvular Problems/Murmurs (MV replacement and AVR 2008)  Rhythm:Regular Rate:Normal  HLD  TTE 12/2018 EF 50% NORMAL RIGHT VENTRICULAR SYSTOLIC FUNCTION VALVULAR REGURGITATION: TRIVIAL AR, MILD TR PROSTHETIC VALVE(S): MECH PROSTHETIC AoV, MECH PROSTHETIC MV VERY POOR SOUND TRANSMISSION DESPITE THE USE OF IMAGING AGENT NEAR NORMAL LV FUNCTION RV ENLARGED BUT WITH NORMAL FUNCTION  Holter Monitor 2019  Conclusions: 1) This 48 hour Holter scan was adequate for interpretation 2) The patient was in sinus rhythm, sinus bradycardia, sinus tachycardia sinus arrhythmia (strips 7, 11). 3) Ventricular ectopic activity consisted of multifocal PVCs (strips 5, 9),  couplets (strips 6, 10). 4) Supraventricular ectopic activity consisted of PACs (strips 8, 11), rare nonsustained ectopic actrial rhythm. The longest and fastest run was 13 beats, 83 BPM at 4:43 PM (strip 12). 5) There were no pauses greater than 2.0  seconds noted. 6) There were no symptoms noted in patient diary, nor was the event button activated.   Neuro/Psych  Headaches, CVA negative psych ROS   GI/Hepatic negative GI ROS, Neg liver ROS,   Endo/Other  Hypothyroidism   Renal/GU Renal InsufficiencyRenal disease  negative genitourinary   Musculoskeletal negative musculoskeletal ROS (+)   Abdominal   Peds  Hematology  (+) Blood dyscrasia (on  coumadin), anemia ,   Anesthesia Other Findings Colonoscopy, enteroscopy for anemia  Reproductive/Obstetrics                            Anesthesia Physical Anesthesia Plan  ASA: III  Anesthesia Plan: MAC   Post-op Pain Management:    Induction: Intravenous  PONV Risk Score and Plan: Propofol infusion and Treatment may vary due to age or medical condition  Airway Management Planned: Natural Airway  Additional Equipment:   Intra-op Plan:   Post-operative Plan:   Informed Consent: I have reviewed the patients History and Physical, chart, labs and discussed the procedure including the risks, benefits and alternatives for the proposed anesthesia with the patient or authorized representative who has indicated his/her understanding and acceptance.     Dental advisory given  Plan Discussed with: CRNA  Anesthesia Plan Comments:         Anesthesia Quick Evaluation

## 2019-02-22 ENCOUNTER — Other Ambulatory Visit: Payer: Self-pay

## 2019-02-22 ENCOUNTER — Ambulatory Visit (HOSPITAL_COMMUNITY): Payer: Medicare Other | Admitting: Anesthesiology

## 2019-02-22 ENCOUNTER — Encounter (HOSPITAL_COMMUNITY): Payer: Medicare Other

## 2019-02-22 ENCOUNTER — Encounter (HOSPITAL_COMMUNITY)
Admission: RE | Disposition: A | Discharge status: Home / Self Care | Payer: Self-pay | Source: Home / Self Care | Attending: Gastroenterology

## 2019-02-22 ENCOUNTER — Encounter (HOSPITAL_COMMUNITY): Payer: Self-pay

## 2019-02-22 ENCOUNTER — Ambulatory Visit (HOSPITAL_COMMUNITY)
Admission: RE | Admit: 2019-02-22 | Discharge: 2019-02-22 | Disposition: A | Discharge status: Home / Self Care | Payer: Medicare Other | Attending: Gastroenterology | Admitting: Gastroenterology

## 2019-02-22 DIAGNOSIS — K5730 Diverticulosis of large intestine without perforation or abscess without bleeding: Secondary | ICD-10-CM | POA: Diagnosis not present

## 2019-02-22 DIAGNOSIS — K2950 Unspecified chronic gastritis without bleeding: Secondary | ICD-10-CM | POA: Insufficient documentation

## 2019-02-22 DIAGNOSIS — D120 Benign neoplasm of cecum: Secondary | ICD-10-CM | POA: Diagnosis not present

## 2019-02-22 DIAGNOSIS — D509 Iron deficiency anemia, unspecified: Secondary | ICD-10-CM | POA: Insufficient documentation

## 2019-02-22 DIAGNOSIS — Z9010 Acquired absence of unspecified breast and nipple: Secondary | ICD-10-CM | POA: Diagnosis not present

## 2019-02-22 DIAGNOSIS — M545 Low back pain, unspecified: Secondary | ICD-10-CM

## 2019-02-22 DIAGNOSIS — K209 Esophagitis, unspecified: Secondary | ICD-10-CM | POA: Insufficient documentation

## 2019-02-22 DIAGNOSIS — I509 Heart failure, unspecified: Secondary | ICD-10-CM | POA: Insufficient documentation

## 2019-02-22 DIAGNOSIS — I2510 Atherosclerotic heart disease of native coronary artery without angina pectoris: Secondary | ICD-10-CM | POA: Diagnosis not present

## 2019-02-22 DIAGNOSIS — K640 First degree hemorrhoids: Secondary | ICD-10-CM | POA: Diagnosis not present

## 2019-02-22 DIAGNOSIS — Q438 Other specified congenital malformations of intestine: Secondary | ICD-10-CM | POA: Insufficient documentation

## 2019-02-22 DIAGNOSIS — I130 Hypertensive heart and chronic kidney disease with heart failure and stage 1 through stage 4 chronic kidney disease, or unspecified chronic kidney disease: Secondary | ICD-10-CM | POA: Diagnosis not present

## 2019-02-22 DIAGNOSIS — D649 Anemia, unspecified: Secondary | ICD-10-CM | POA: Insufficient documentation

## 2019-02-22 DIAGNOSIS — Z7901 Long term (current) use of anticoagulants: Secondary | ICD-10-CM | POA: Diagnosis not present

## 2019-02-22 DIAGNOSIS — Z9221 Personal history of antineoplastic chemotherapy: Secondary | ICD-10-CM | POA: Insufficient documentation

## 2019-02-22 DIAGNOSIS — K222 Esophageal obstruction: Secondary | ICD-10-CM

## 2019-02-22 DIAGNOSIS — Z8673 Personal history of transient ischemic attack (TIA), and cerebral infarction without residual deficits: Secondary | ICD-10-CM | POA: Diagnosis not present

## 2019-02-22 DIAGNOSIS — K635 Polyp of colon: Secondary | ICD-10-CM

## 2019-02-22 DIAGNOSIS — K449 Diaphragmatic hernia without obstruction or gangrene: Secondary | ICD-10-CM | POA: Insufficient documentation

## 2019-02-22 DIAGNOSIS — D122 Benign neoplasm of ascending colon: Secondary | ICD-10-CM | POA: Diagnosis not present

## 2019-02-22 DIAGNOSIS — Z952 Presence of prosthetic heart valve: Secondary | ICD-10-CM | POA: Diagnosis not present

## 2019-02-22 DIAGNOSIS — Q439 Congenital malformation of intestine, unspecified: Secondary | ICD-10-CM | POA: Diagnosis not present

## 2019-02-22 DIAGNOSIS — K644 Residual hemorrhoidal skin tags: Secondary | ICD-10-CM | POA: Diagnosis not present

## 2019-02-22 DIAGNOSIS — N189 Chronic kidney disease, unspecified: Secondary | ICD-10-CM | POA: Insufficient documentation

## 2019-02-22 DIAGNOSIS — Z853 Personal history of malignant neoplasm of breast: Secondary | ICD-10-CM | POA: Diagnosis not present

## 2019-02-22 HISTORY — DX: Localized edema: R60.0

## 2019-02-22 HISTORY — PX: HEMOSTASIS CLIP PLACEMENT: SHX6857

## 2019-02-22 HISTORY — PX: ENTEROSCOPY: SHX5533

## 2019-02-22 HISTORY — PX: BIOPSY: SHX5522

## 2019-02-22 HISTORY — DX: Asymptomatic varicose veins of bilateral lower extremities: I83.93

## 2019-02-22 HISTORY — PX: COLONOSCOPY WITH PROPOFOL: SHX5780

## 2019-02-22 HISTORY — PX: POLYPECTOMY: SHX5525

## 2019-02-22 HISTORY — DX: Cardiac murmur, unspecified: R01.1

## 2019-02-22 HISTORY — PX: SUBMUCOSAL TATTOO INJECTION: SHX6856

## 2019-02-22 SURGERY — COLONOSCOPY WITH PROPOFOL
Anesthesia: Monitor Anesthesia Care

## 2019-02-22 MED ORDER — LACTATED RINGERS IV SOLN
INTRAVENOUS | Status: DC
  Administered 2019-02-22: 07:00:00 via INTRAVENOUS

## 2019-02-22 MED ORDER — PROPOFOL 10 MG/ML IV BOLUS
INTRAVENOUS | Status: DC | PRN
  Administered 2019-02-22: 20 mg via INTRAVENOUS
  Administered 2019-02-22: 30 mg via INTRAVENOUS
  Administered 2019-02-22: 20 mg via INTRAVENOUS
  Administered 2019-02-22: 50 mg via INTRAVENOUS
  Administered 2019-02-22: 20 mg via INTRAVENOUS
  Administered 2019-02-22: 50 mg via INTRAVENOUS

## 2019-02-22 MED ORDER — SODIUM CHLORIDE 0.9 % IV SOLN: INTRAVENOUS | Status: DC

## 2019-02-22 MED ORDER — LIDOCAINE 2% (20 MG/ML) 5 ML SYRINGE
INTRAMUSCULAR | Status: DC | PRN
  Administered 2019-02-22: 40 mg via INTRAVENOUS

## 2019-02-22 MED ORDER — PROPOFOL 500 MG/50ML IV EMUL
INTRAVENOUS | Status: DC | PRN
  Administered 2019-02-22: 50 ug/kg/min via INTRAVENOUS

## 2019-02-22 MED ORDER — SPOT INK MARKER SYRINGE KIT
PACK | SUBMUCOSAL | Status: AC
  Filled 2019-02-22: qty 10

## 2019-02-22 MED ORDER — SPOT INK MARKER SYRINGE KIT
PACK | SUBMUCOSAL | Status: DC | PRN
  Administered 2019-02-22: 2 mL via SUBMUCOSAL

## 2019-02-22 MED ORDER — PHENYLEPHRINE 40 MCG/ML (10ML) SYRINGE FOR IV PUSH (FOR BLOOD PRESSURE SUPPORT)
PREFILLED_SYRINGE | INTRAVENOUS | Status: DC | PRN
  Administered 2019-02-22 (×5): 80 ug via INTRAVENOUS

## 2019-02-22 MED ORDER — OMEPRAZOLE 40 MG PO CPDR: 40.0000 mg | DELAYED_RELEASE_CAPSULE | Freq: Every day | ORAL | 4 refills | Status: DC

## 2019-02-22 SURGICAL SUPPLY — 22 items

## 2019-02-22 NOTE — H&P (Signed)
GASTROENTEROLOGY PROCEDURE H&P NOTE   Primary Care Physician: Marin Olp, MD  HPI: Grace [name] is a 68 y.o. female who presents for SBE/Colonoscopy.  Past Medical History:  Diagnosis Date  . Anemia   . Aortic insufficiency   . AORTIC VALVE REPLACEMENT, HX OF 04/27/2007   Qualifier: Diagnosis of  By: Leanne Chang MD, Bruce    . Cancer [facility])    left breast- surgery and chemo  . CHF (congestive heart failure) ([place])   . Chronic kidney disease   . Congestive heart failure ([place]) 08/03/2009   [facility] Dr. Paul Half. Last echo 2010 with EF 30%. Restrictive due to radiation. AVR MVR. 02/2014 visit planned.    . Coronary artery disease    11/06/06 [facility]): 40% oLM, 60% dLAD, 30% oRCA  . Dysrhythmia    paroxsymal a-flutter 2018; intermittent left BBB (2016)  . Edema of both feet   . GI bleed   . Heart murmur    mitral and aortic valve replacement  . Hodgkin's disease    ~1974, s/p [place] field radiation and chemo  . Hyperlipidemia   . Hypothyroidism   . Migraine    none since 2008  . MITRAL VALVE REPLACEMENT, HX OF 04/27/2007   Qualifier: Diagnosis of  By: Leanne Chang MD, Bruce    . Pericarditis    s/p pericardidal stripping through lateral thoracotomy 01/2007 [facility])  . Stroke [facility]) 2008   No residual effects  . TIA (transient ischemic attack)   . Tricuspid regurgitation   . Varicose veins of both lower extremities    left worse than right- have gotten larger in April 2020  . WPW (Wolff-Parkinson-White syndrome)    Past Surgical History:  Procedure Laterality Date  . AORTIC VALVE SURGERY     19 mm mechanical [employer] AVR 05/2006 [facility], Dr. Lajuana Matte)  . bilateral breast implants    . ENDOMETRIAL ABLATION    . EXPLORATORY LAPAROTOMY    . KYPHOPLASTY N/A 12/10/2018   Procedure: KYPHOPLASTY THORACIC SIX- THORACIC SEVEN;  Surgeon: Newman Pies, MD;  Location: [employer];  Service: Neurosurgery;  Laterality: N/A;  KYPHOPLASTY THORACIC SIX- THORACIC SEVEN  . MASTECTOMY    . MITRAL  VALVE REPLACEMENT     25 mm [employer] MVR, 05/2006, Dr. Lajuana Matte, [place]  . pericardial stripping  9/08  . SPLENECTOMY     ~ 1974  . SPLENECTOMY, TOTAL    . THORACOTOMY     for pericardial stripping 01/2007 ([facility])  . TONSILLECTOMY    . valvulopathy  06/13/06   Current Facility-Administered Medications  Medication Dose Route Frequency Provider Last Rate Last Dose  . 0.9 %  sodium chloride infusion   Intravenous Continuous Mansouraty, Telford Nab., MD      . lactated ringers infusion   Intravenous Continuous Mansouraty, Telford Nab., MD 20 mL/hr at 02/22/19 [id number]     Allergies  Allergen Reactions  . Digoxin Other (See Comments)  . Statins     myalgia   Family History  Problem Relation Age of Onset  . Heart disease Mother   . Cancer Mother        breast  . Heart disease Maternal Aunt   . Cancer Maternal Aunt        uterine  . Heart disease Maternal Grandmother   . Diabetes Maternal Grandmother   . Hypercalcemia Neg Hx   . Colon cancer Neg Hx   . Esophageal cancer Neg Hx   . Liver disease Neg Hx   .  Pancreatic cancer Neg Hx   . Rectal cancer Neg Hx   . Stomach cancer Neg Hx    Social History   Socioeconomic History  . Marital status: Married    Spouse name: Not on file  . Number of children: Not on file  . Years of education: Not on file  . Highest education level: Not on file  Occupational History  . Not on file  Social Needs  . Financial resource strain: Not on file  . Food insecurity    Worry: Not on file    Inability: Not on file  . Transportation needs    Medical: Not on file    Non-medical: Not on file  Tobacco Use  . Smoking status: Never Smoker  . Smokeless tobacco: Never Used  Substance and Sexual Activity  . Alcohol use: No  . Drug use: No  . Sexual activity: Not on file  Lifestyle  . Physical activity    Days per week: Not on file    Minutes per session: Not on file  . Stress: Not on file  Relationships  . Social Product manager on phone: Not on file    Gets together: Not on file    Attends religious service: Not on file    Active member of club or organization: Not on file    Attends meetings of clubs or organizations: Not on file    Relationship status: Not on file  . Intimate partner violence    Fear of current or ex partner: Not on file    Emotionally abused: Not on file    Physically abused: Not on file    Forced sexual activity: Not on file  Other Topics Concern  . Not on file  Social History Narrative   Lived in [place] for 18 years. Moved here for the weather.    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      Husband retired January 2015. Married 1991 (2nd marriage). No kids.       Hobbies: walks 2 miles per day, read, work outside          Physical Exam: Vital signs in last 24 hours: Temp:  [98.7 F (37.1 C)] 98.7 F (37.1 C) (09/28 0655) Pulse Rate:  [110] 110 (09/28 0655) Resp:  [24] 24 (09/28 0655) BP: (125-155)/(47-71) 125/47 (09/28 0700) SpO2:  [99 %] 99 % (09/28 0655) Weight:  [56.7 kg] 56.7 kg (09/28 0655)   GEN: NAD EYE: Sclerae anicteric ENT: MMM CV: Non-tachycardic GI: Soft, NT/ND NEURO:  Alert & Oriented x 3  Lab Results: No results for input(s): WBC, HGB, HCT, PLT in the last 72 hours. BMET No results for input(s): NA, K, CL, CO2, GLUCOSE, BUN, CREATININE, CALCIUM in the last 72 hours. LFT No results for input(s): PROT, ALBUMIN, AST, ALT, ALKPHOS, BILITOT, BILIDIR, IBILI in the last 72 hours. PT/INR No results for input(s): LABPROT, INR in the last 72 hours.   Impression / Plan: This is a 68 y.o.female who presents for SBE/Colonoscopy.  The risks and benefits of endoscopic evaluation were discussed with the patient; these include but are not limited to the risk of perforation, infection, bleeding, missed lesions, lack of diagnosis, severe illness requiring hospitalization, as well as anesthesia and  sedation related illnesses.  The patient is agreeable to proceed.    Justice Britain, MD [facility] # [phone]

## 2019-02-22 NOTE — Anesthesia Procedure Notes (Signed)
Procedure Name: MAC Performed by: Renato Shin, CRNA Pre-anesthesia Checklist: Patient identified, Emergency Drugs available, Suction available and Patient being monitored Patient Re-evaluated:Patient Re-evaluated prior to induction Oxygen Delivery Method: Simple face mask Preoxygenation: Pre-oxygenation with 100% oxygen Induction Type: IV induction Airway Equipment and Method: Oral airway Placement Confirmation: positive ETCO2 and breath sounds checked- equal and bilateral Dental Injury: Teeth and Oropharynx as per pre-operative assessment

## 2019-02-22 NOTE — Discharge Instructions (Signed)
YOU HAD AN ENDOSCOPIC PROCEDURE TODAY: Refer to the procedure report and other information in the discharge instructions given to you for any specific questions about what was found during the examination. If this information does not answer your questions, please call [place] office at [phone] to clarify.  ° °YOU SHOULD EXPECT: Some feelings of bloating in the abdomen. Passage of more gas than usual. Walking can help get rid of the air that was put into your GI tract during the procedure and reduce the bloating. If you had a lower endoscopy (such as a colonoscopy or flexible sigmoidoscopy) you may notice spotting of blood in your stool or on the toilet paper. Some abdominal soreness may be present for a day or two, also. ° °DIET: Your first meal following the procedure should be a light meal and then it is ok to progress to your normal diet. A half-sandwich or bowl of soup is an example of a good first meal. Heavy or fried foods are harder to digest and may make you feel nauseous or bloated. Drink plenty of fluids but you should avoid alcoholic beverages for 24 hours. If you had a esophageal dilation, please see attached instructions for diet.   ° °ACTIVITY: Your care partner should take you home directly after the procedure. You should plan to take it easy, moving slowly for the rest of the day. You can resume normal activity the day after the procedure however YOU SHOULD NOT DRIVE, use power tools, machinery or perform tasks that involve climbing or major physical exertion for 24 hours (because of the sedation medicines used during the test).  ° °SYMPTOMS TO REPORT IMMEDIATELY: °A gastroenterologist can be reached at any hour. Please call [phone]  for any of the following symptoms:  °Following lower endoscopy (colonoscopy, flexible sigmoidoscopy) °Excessive amounts of blood in the stool  °Significant tenderness, worsening of abdominal pains  °Swelling of the abdomen that is new, acute  °Fever of 100° or  higher  °Following upper endoscopy (EGD, EUS, ERCP, esophageal dilation) °Vomiting of blood or coffee ground material  °New, significant abdominal pain  °New, significant chest pain or pain under the shoulder blades  °Painful or persistently difficult swallowing  °New shortness of breath  °Black, tarry-looking or red, bloody stools ° °FOLLOW UP:  °If any biopsies were taken you will be contacted by phone or by letter within the next 1-3 weeks. Call [phone]  if you have not heard about the biopsies in 3 weeks.  °Please also call with any specific questions about appointments or follow up tests. ° °

## 2019-02-22 NOTE — Op Note (Signed)
[facility] Patient Name: Grace [name] Procedure Date : 02/22/2019 MRN: [id number] Attending MD: Justice Britain , MD Date of Birth: [birth year] CSN: [id number] Age: 68 Admit Type: Outpatient Procedure:                Small bowel enteroscopy Indications:              Iron deficiency anemia Providers:                Justice Britain, MD, Vista Lawman, RN, Elspeth Cho Tech., Technician, [employer], CRNA Referring MD:              Medicines:                Monitored Anesthesia Care Complications:            No immediate complications. Estimated Blood Loss:     Estimated blood loss was minimal. Procedure:                Pre-Anesthesia Assessment:                           - Prior to the procedure, a History and Physical                            was performed, and patient medications and                            allergies were reviewed. The patient's tolerance of                            previous anesthesia was also reviewed. The risks                            and benefits of the procedure and the sedation                            options and risks were discussed with the patient.                            All questions were answered, and informed consent                            was obtained. Prior Anticoagulants: The patient                            last took Coumadin (warfarin) 5 days and Lovenox                            (enoxaparin) 1 day prior to the procedure. ASA                            Grade Assessment: III - A patient with severe  systemic disease. After reviewing the risks and                            benefits, the patient was deemed in satisfactory                            condition to undergo the procedure.                           After obtaining informed consent, the endoscope was                            passed under direct vision. Throughout the     procedure, the patient's blood pressure, pulse, and                            oxygen saturations were monitored continuously. The                            [id number] Olympus pediatric colonoscope                            was introduced through the mouth and advanced to                            the proximal jejunum. The small bowel enteroscopy                            was accomplished without difficulty. The patient                            tolerated the procedure. Scope In: Scope Out: Findings:      No gross lesions were noted in the proximal esophagus and in the mid       esophagus.      LA Grade B (one or more mucosal breaks greater than 5 mm, not extending       between the tops of two mucosal folds) esophagitis with no bleeding was       found in the distal esophagus.      A non-obstructing Schatzki ring was found at the gastroesophageal       junction.      A 3 cm hiatal hernia was found. The proximal extent of the gastric folds       (end of tubular esophagus) was 37 cm from the incisors. The hiatal       narrowing was 40 cm from the incisors. The Z-line was 37 cm from the       incisors.      Localized mildly erythematous mucosa without bleeding was found in the       gastric body.      No other gross lesions were noted in the entire examined stomach.       Biopsies were taken with a cold forceps for histology and Helicobacter       pylori testing.      Normal mucosa was found in the entire duodenum.      Normal mucosa was found in the proximal jejunum.  Biopsies for histology       were taken with a cold forceps for evaluation of celiac disease. Area       was tattooed with an injection of Spot (carbon black) to demarcate the       distal extent of SBE. Impression:               - No gross lesions in proximal esophagus. LA Grade                            B esophagitis distally. Non-obstructing Schatzki                            ring. 3 cm hiatal  hernia.                           - Erythematous mucosa in the gastric body. Biopsied                            for HP.                           - Normal mucosa was found in the entire examined                            duodenum.                           - Normal mucosa was found in the jejunum. Biopsied                            for Celiac/Enteropathy. Tattooed distal extent. Recommendation:           - Proceed to scheduled colonoscopy.                           - Await pathology results.                           - Initiate Omeprazole 40 mg daily for next 59-months.                           - Consider repeat EGD to ensure healing in 84-months                            and although no dysphagia symptoms can consider                            role of dilation in future if that occurs.                           - The findings and recommendations were discussed                            with the patient.                           -  The findings and recommendations were discussed                            with the patient's family. Procedure Code(s):        --- Professional ---                           [phone], Small intestinal endoscopy, enteroscopy                            beyond second portion of duodenum, not including                            ileum; with biopsy, single or multiple                           44799, Unlisted procedure, small intestine Diagnosis Code(s):        --- Professional ---                           K20.9, Esophagitis, unspecified                           K22.2, Esophageal obstruction                           K44.9, Diaphragmatic hernia without obstruction or                            gangrene                           K31.89, Other diseases of stomach and duodenum                           D50.9, Iron deficiency anemia, unspecified CPT copyright 2019 American Medical Association. All rights reserved. The codes documented in this report are  preliminary and upon coder review may  be revised to meet current compliance requirements. Justice Britain, MD 02/22/2019 8:47:46 AM Number of Addenda: 0

## 2019-02-22 NOTE — Transfer of Care (Signed)
Immediate Anesthesia Transfer of Care Note  Patient: Grace [name]  Procedure(s) Performed: COLONOSCOPY WITH PROPOFOL (N/A ) ENTEROSCOPY (N/A ) SUBMUCOSAL TATTOO INJECTION BIOPSY POLYPECTOMY HEMOSTASIS CLIP PLACEMENT  Patient Location: PACU and Endoscopy Unit  Anesthesia Type:MAC  Level of Consciousness: drowsy and patient cooperative  Airway & Oxygen Therapy: Patient Spontanous Breathing and Patient connected to face mask oxygen  Post-op Assessment: Report given to RN and Post -op Vital signs reviewed and stable  Post vital signs: Reviewed and stable  Last Vitals:  Vitals Value Taken Time  BP 113/54 02/22/19 0844  Temp    Pulse 96 02/22/19 0846  Resp 22 02/22/19 0846  SpO2 100 % 02/22/19 0846  Vitals shown include unvalidated device data.  Last Pain:  Vitals:   02/22/19 0655  TempSrc: Temporal  PainSc: 0-No pain         Complications: No apparent anesthesia complications

## 2019-02-22 NOTE — Op Note (Signed)
[facility] ([facility]) Patient Name: Grace [name] Procedure Date : 02/22/2019 MRN: [id number] Attending MD: Justice Britain , MD Date of Birth: Mar 29, 1951 CSN: [id number] Age: 68 Admit Type: Outpatient Procedure:                Colonoscopy Indications:              Iron deficiency anemia Providers:                Justice Britain, MD, Vista Lawman, RN, Elspeth Cho Tech., Technician Referring MD:             [name]. [name] MD, Minus Breeding, MD Medicines:                Monitored Anesthesia Care Complications:            No immediate complications. Estimated Blood Loss:     Estimated blood loss was minimal. Procedure:                Pre-Anesthesia Assessment:                           - Prior to the procedure, a History and Physical                            was performed, and patient medications and                            allergies were reviewed. The patient's tolerance of                            previous anesthesia was also reviewed. The risks                            and benefits of the procedure and the sedation                            options and risks were discussed with the patient.                            All questions were answered, and informed consent                            was obtained. Prior Anticoagulants: The patient                            last took Coumadin (warfarin) 5 days and Lovenox                            (enoxaparin) 1 day prior to the procedure. ASA                            Grade Assessment: III - A patient with severe  systemic disease. After reviewing the risks and                            benefits, the patient was deemed in satisfactory                            condition to undergo the procedure.                           After obtaining informed consent, the colonoscope                            was passed under direct vision. Throughout the               procedure, the patient's blood pressure, pulse, and                            oxygen saturations were monitored continuously. The                            PCF-H190DL ([id number]) Olympus pediatric colonoscope                            was introduced through the anus and advanced to the                            the cecum, identified by appendiceal orifice and                            ileocecal valve. The colonoscopy was somewhat                            difficult due to a redundant colon and a tortuous                            colon. Successful completion of the procedure was                            aided by changing the patient's position, using                            manual pressure, withdrawing and reinserting the                            scope, straightening and shortening the scope to                            obtain bowel loop reduction and using scope                            torsion. The patient tolerated the procedure. The                            quality of the bowel preparation was  good. The                            ileocecal valve, appendiceal orifice, and rectum                            were photographed. Scope In: 8:03:02 AM Scope Out: 8:39:24 AM Scope Withdrawal Time: 0 hours 30 minutes 32 seconds  Total Procedure Duration: 0 hours 36 minutes 22 seconds  Findings:      The digital rectal exam findings include hemorrhoids. Pertinent       negatives include no palpable rectal lesions.      Four sessile polyps were found in the ascending colon (2) and cecum (2).       The polyps were 4 to 10 mm in size. These polyps were removed with a       cold snare. Resection and retrieval were complete. To prevent bleeding       after the polypectomy on the larger cecal polyp, three hemostatic clips       were successfully placed (MR conditional). There was no bleeding at the       end of the procedure. To prevent bleeding after the polypectomy of the        larger ascending colon polyp, one hemostatic clip was successfully       placed (MR conditional). There was no bleeding at the end of the       procedure.      A few small-mouthed diverticula were found in the recto-sigmoid colon,       sigmoid colon and ascending colon.      Normal mucosa was found in the entire colon otherwise.      Non-bleeding non-thrombosed external and internal hemorrhoids were found       during retroflexion, during perianal exam and during digital exam. The       hemorrhoids were Grade I (internal hemorrhoids that do not prolapse). Impression:               - Hemorrhoids found on digital rectal exam.                           - Four 4 to 10 mm polyps in the ascending colon and                            in the cecum, removed with a cold snare. Resected                            and retrieved. Clips (MR conditional) were placed                            on 2 of the resection sites                           - Diverticulosis in the recto-sigmoid colon, in the                            sigmoid colon and in the ascending colon.                           -  Normal mucosa in the entire examined colon.                           - Non-bleeding non-thrombosed external and internal                            hemorrhoids. Recommendation:           - The patient will be observed post-procedure,                            until all discharge criteria are met.                           - Discharge patient to home.                           - Patient has a contact number available for                            emergencies. The signs and symptoms of potential                            delayed complications were discussed with the                            patient. Return to normal activities tomorrow.                            Written discharge instructions were provided to the                            patient.                           - High fiber diet.                            - Continue present medications.                           - The patient will be at risk of bleeding                            post-polypectomy, but with removal via cold-snare                            resection, hopefully we can mitigate this as much                            as possible. Due to her underlying valvular issues,                            will proceed with Lovenox being restart tonight (at  least 12 hours after completion of today's                            procedure) and then Coumadin can be restarted in 48                            hours (I would normally wait 72 hours before any                            anticoagulation, but due to her risk of valvular                            issues and clot formation we will take this chance).                           - Await pathology results.                           - Stool softeners daily if any evidence of                            recurrent bleeding (after the post-polypectomy                            period) to consider role in future of hemorrhoidal                            banding if necessary.                           - Repeat colonoscopy in 3 years for surveillance of                            multiple polyps.                           - The patient needs another IV Iron infusion in the                            next few weeks. Then we will plan to recheck her                            labs after. Will decide on ability to maintain her                            Iron stores with IV and PO Iron supplementation. If                            she manifests recurrent IDA, then will need to                            proceed with VCE to further evaluate the small  bowel for potential sources of IDA.                           - The findings and recommendations were discussed                            with the patient.                            - The findings and recommendations were discussed                            with the patient's family. Procedure Code(s):        --- Professional ---                           [phone], Colonoscopy, flexible; with removal of                            tumor(s), polyp(s), or other lesion(s) by snare                            technique Diagnosis Code(s):        --- Professional ---                           K64.0, First degree hemorrhoids                           K63.5, Polyp of colon                           D50.9, Iron deficiency anemia, unspecified                           K57.30, Diverticulosis of large intestine without                            perforation or abscess without bleeding CPT copyright 2019 [employer] Medical Association. All rights reserved. The codes documented in this report are preliminary and upon coder review may  be revised to meet current compliance requirements. Justice Britain, MD 02/22/2019 8:56:43 AM Number of Addenda: 0

## 2019-02-22 NOTE — Anesthesia Postprocedure Evaluation (Signed)
Anesthesia Post Note  Patient: SHIVANI BARRANTES  Procedure(s) Performed: COLONOSCOPY WITH PROPOFOL (N/A ) ENTEROSCOPY (N/A ) SUBMUCOSAL TATTOO INJECTION BIOPSY POLYPECTOMY HEMOSTASIS CLIP PLACEMENT     Patient location during evaluation: Endoscopy Anesthesia Type: MAC Level of consciousness: awake and alert Pain management: pain level controlled Vital Signs Assessment: post-procedure vital signs reviewed and stable Respiratory status: spontaneous breathing, nonlabored ventilation, respiratory function stable and patient connected to nasal cannula oxygen Cardiovascular status: stable and blood pressure returned to baseline Postop Assessment: no apparent nausea or vomiting Anesthetic complications: no    Last Vitals:  Vitals:   02/22/19 0854 02/22/19 0859  BP: (!) 90/39   Pulse: 91   Resp: (!) 23   Temp:  36.8 C  SpO2: 99%     Last Pain:  Vitals:   02/22/19 0859  TempSrc: Temporal  PainSc:                  Chelsey L Woodrum

## 2019-02-23 ENCOUNTER — Encounter (HOSPITAL_COMMUNITY): Payer: Self-pay | Admitting: Gastroenterology

## 2019-02-23 NOTE — Progress Notes (Signed)
Attempted, unable to leave message 

## 2019-02-24 LAB — SURGICAL PATHOLOGY

## 2019-02-26 ENCOUNTER — Encounter: Payer: Self-pay | Admitting: Family Medicine

## 2019-02-26 ENCOUNTER — Other Ambulatory Visit: Payer: Self-pay

## 2019-02-26 ENCOUNTER — Ambulatory Visit (INDEPENDENT_AMBULATORY_CARE_PROVIDER_SITE_OTHER): Payer: Medicare Other | Admitting: General Practice

## 2019-02-26 ENCOUNTER — Encounter: Payer: Self-pay | Admitting: Gastroenterology

## 2019-02-26 DIAGNOSIS — Z23 Encounter for immunization: Secondary | ICD-10-CM | POA: Diagnosis not present

## 2019-02-26 DIAGNOSIS — Z7901 Long term (current) use of anticoagulants: Secondary | ICD-10-CM | POA: Diagnosis not present

## 2019-02-26 DIAGNOSIS — Z86010 Personal history of colonic polyps: Secondary | ICD-10-CM | POA: Insufficient documentation

## 2019-02-26 LAB — POCT INR: INR: 1.8 — AB (ref 2.0–3.0)

## 2019-02-26 NOTE — Patient Instructions (Addendum)
Pre visit review using our clinic review tool, if applicable. No additional management support is needed unless otherwise documented below in the visit note.  Take 1 tablet today (10/2), take 1 1/2 tablets on Saturday and Sunday.  On Monday continue to take 1 tablet daily except 1/2 tablet on Mondays and Fridays.  Re-check in 3 weeks.

## 2019-02-26 NOTE — Progress Notes (Signed)
Medical screening examination/treatment/procedure(s) were performed by non-physician practitioner and as supervising physician I was immediately available for consultation/collaboration. I agree with above. Daveda Larock, MD   

## 2019-03-01 ENCOUNTER — Telehealth: Payer: Self-pay

## 2019-03-01 ENCOUNTER — Other Ambulatory Visit: Payer: Self-pay

## 2019-03-01 ENCOUNTER — Ambulatory Visit (HOSPITAL_COMMUNITY)
Admission: RE | Admit: 2019-03-01 | Discharge: 2019-03-01 | Disposition: A | Discharge status: Home / Self Care | Payer: Medicare Other | Source: Ambulatory Visit | Attending: Internal Medicine | Admitting: Internal Medicine

## 2019-03-01 DIAGNOSIS — D509 Iron deficiency anemia, unspecified: Secondary | ICD-10-CM | POA: Insufficient documentation

## 2019-03-01 MED ORDER — SODIUM CHLORIDE 0.9 % IV SOLN
INTRAVENOUS | Status: DC | PRN
  Administered 2019-03-01: 250 mL via INTRAVENOUS

## 2019-03-01 MED ORDER — SODIUM CHLORIDE 0.9 % IV SOLN
510.0000 mg | Freq: Once | INTRAVENOUS | Status: AC
  Administered 2019-03-01: 510 mg via INTRAVENOUS
  Filled 2019-03-01: qty 17

## 2019-03-01 MED ORDER — SODIUM CHLORIDE 0.9 % IV BOLUS
250.0000 mL | Freq: Once | INTRAVENOUS | Status: AC
  Administered 2019-03-01: 250 mL via INTRAVENOUS

## 2019-03-01 NOTE — Discharge Instructions (Signed)

## 2019-03-01 NOTE — Telephone Encounter (Signed)
Pt at pt care center for fereheme infusion. Nurse from pt care ctr. States pts BP is 92/46 and he wants to know if you want him to proceed with the infusion or if you have any other recommendations. Please advise.

## 2019-03-01 NOTE — Telephone Encounter (Signed)
Noted  

## 2019-03-01 NOTE — Telephone Encounter (Signed)
I spoke with the infusion center.  I was okay to move forward with the blood pressure of the patient.  Also asked to give a 250 cc normal saline bolus over the course of the Feraheme infusion.  Would be available for follow-up call if other issues develop but otherwise she had tolerated the first infusion so I anticipate she will do just as well. Thanks for reaching out.

## 2019-03-01 NOTE — Progress Notes (Signed)
Patient received Feraheme via PIV. Pre infusion BP was 92/46. Mansouraty G MD was contacted. Per MD it was okay to infuse. RN already ready a verbal order for 250 normal saline bolus. Post infusion BP was 100/50.  Patient was observed for at least 30 minutes post infusion.Tolerated well, vitals stable, discharge instructions given, verbalized understanding. Patient alert, oriented and ambulatory at the time of discharge.

## 2019-03-02 ENCOUNTER — Ambulatory Visit: Payer: Medicare Other

## 2019-03-18 ENCOUNTER — Telehealth: Payer: Self-pay | Admitting: Internal Medicine

## 2019-03-18 NOTE — Telephone Encounter (Signed)
Please call patient at ph# [phone] re: patient is having Prolia injection on 04/01/19. Patient takes Jaclyn Prime and has questions about taking the [place] on 03/28/19.

## 2019-03-18 NOTE — Telephone Encounter (Signed)
I will leave it up to her if she wants to take the [place] on 03/28/2019 or not before Prolia.  If she ran out, I would not suggest to refill it just for 1 dose.  If she still has 1 dose, then go ahead and take it.  I hope this answers her question, if not, let me know.

## 2019-03-19 ENCOUNTER — Other Ambulatory Visit: Payer: Self-pay

## 2019-03-19 ENCOUNTER — Ambulatory Visit (INDEPENDENT_AMBULATORY_CARE_PROVIDER_SITE_OTHER): Payer: Medicare Other | Admitting: General Practice

## 2019-03-19 DIAGNOSIS — Z952 Presence of prosthetic heart valve: Secondary | ICD-10-CM

## 2019-03-19 DIAGNOSIS — Z7901 Long term (current) use of anticoagulants: Secondary | ICD-10-CM

## 2019-03-19 LAB — POCT INR: INR: 4.7 — AB (ref 2.0–3.0)

## 2019-03-19 NOTE — Patient Instructions (Addendum)
Pre visit review using our clinic review tool, if applicable. No additional management support is needed unless otherwise documented below in the visit note.  Skip coumadin today and tomorrow (10/23 and 10/24).  On Sunday start taking 1 tablet daily except 1/2 tablet on Mon Wed and Fridays.  Re-check in 3 weeks.

## 2019-03-19 NOTE — Telephone Encounter (Signed)
Notified patient of message from Dr. Cruzita Lederer, patient expressed understanding and agreement. No further questions.  Patient has one dose left so she will use it.

## 2019-03-19 NOTE — Progress Notes (Signed)
Medical screening examination/treatment/procedure(s) were performed by non-physician practitioner and as supervising physician I was immediately available for consultation/collaboration. I agree with above. Juliett Eastburn, MD   

## 2019-03-22 ENCOUNTER — Other Ambulatory Visit (INDEPENDENT_AMBULATORY_CARE_PROVIDER_SITE_OTHER): Payer: Medicare Other

## 2019-03-22 DIAGNOSIS — D509 Iron deficiency anemia, unspecified: Secondary | ICD-10-CM | POA: Diagnosis not present

## 2019-03-22 LAB — CBC WITH DIFFERENTIAL/PLATELET
Basophils Absolute: 0.1 10*3/uL (ref 0.0–0.1)
Basophils Relative: 1.1 % (ref 0.0–3.0)
Eosinophils Absolute: 0.4 10*3/uL (ref 0.0–0.7)
Eosinophils Relative: 3 % (ref 0.0–5.0)
HCT: 37.3 % (ref 36.0–46.0)
Hemoglobin: 12 g/dL (ref 12.0–15.0)
Lymphocytes Relative: 5.7 % — ABNORMAL LOW (ref 12.0–46.0)
Lymphs Abs: 0.7 10*3/uL (ref 0.7–4.0)
MCHC: 32.1 g/dL (ref 30.0–36.0)
MCV: 95.5 fl (ref 78.0–100.0)
Monocytes Absolute: 2 10*3/uL — ABNORMAL HIGH (ref 0.1–1.0)
Monocytes Relative: 15.4 % — ABNORMAL HIGH (ref 3.0–12.0)
Neutro Abs: 9.5 10*3/uL — ABNORMAL HIGH (ref 1.4–7.7)
Neutrophils Relative %: 74.8 % (ref 43.0–77.0)
Platelets: 395 10*3/uL (ref 150.0–400.0)
RBC: 3.9 Mil/uL (ref 3.87–5.11)
RDW: 16 % — ABNORMAL HIGH (ref 11.5–15.5)
WBC: 12.7 10*3/uL — ABNORMAL HIGH (ref 4.0–10.5)

## 2019-03-22 LAB — FERRITIN: Ferritin: 489.5 ng/mL — ABNORMAL HIGH (ref 10.0–291.0)

## 2019-03-22 LAB — IBC PANEL
Iron: 72 ug/dL (ref 42–145)
Saturation Ratios: 20.6 % (ref 20.0–50.0)
Transferrin: 250 mg/dL (ref 212.0–360.0)

## 2019-03-22 LAB — HM MAMMOGRAPHY

## 2019-03-22 LAB — HM DEXA SCAN

## 2019-03-30 ENCOUNTER — Encounter: Payer: Self-pay | Admitting: Internal Medicine

## 2019-03-31 ENCOUNTER — Telehealth: Payer: Self-pay

## 2019-03-31 NOTE — Telephone Encounter (Signed)
Left VM requesting patient call back to schedule Prolia injection- she owes $50 and can be scheduled after 04/01/19

## 2019-04-01 ENCOUNTER — Other Ambulatory Visit: Payer: Self-pay

## 2019-04-01 ENCOUNTER — Encounter: Payer: Self-pay | Admitting: Family Medicine

## 2019-04-01 ENCOUNTER — Ambulatory Visit: Payer: Medicare Other

## 2019-04-01 DIAGNOSIS — M4854XA Collapsed vertebra, not elsewhere classified, thoracic region, initial encounter for fracture: Secondary | ICD-10-CM | POA: Diagnosis not present

## 2019-04-01 DIAGNOSIS — M810 Age-related osteoporosis without current pathological fracture: Secondary | ICD-10-CM | POA: Diagnosis not present

## 2019-04-01 MED ORDER — DENOSUMAB 60 MG/ML ~~LOC~~ SOSY
60.0000 mg | PREFILLED_SYRINGE | Freq: Once | SUBCUTANEOUS | Status: AC
  Administered 2019-04-01: 60 mg via SUBCUTANEOUS

## 2019-04-01 NOTE — Progress Notes (Signed)
Per orders of Dr. Cruzita Lederer injection of Prolia given today by Lenna Sciara, Physiological scientist . Patient tolerated injection well.  This was the patient's first dose of Prolia, she was observed in clinic for 20 minutes without event.

## 2019-04-08 ENCOUNTER — Other Ambulatory Visit: Payer: Self-pay | Admitting: Family Medicine

## 2019-04-09 ENCOUNTER — Ambulatory Visit (INDEPENDENT_AMBULATORY_CARE_PROVIDER_SITE_OTHER): Payer: Medicare Other | Admitting: General Practice

## 2019-04-09 ENCOUNTER — Other Ambulatory Visit: Payer: Self-pay

## 2019-04-09 DIAGNOSIS — Z952 Presence of prosthetic heart valve: Secondary | ICD-10-CM | POA: Diagnosis not present

## 2019-04-09 DIAGNOSIS — Z7901 Long term (current) use of anticoagulants: Secondary | ICD-10-CM

## 2019-04-09 LAB — POCT INR: INR: 2.6 (ref 2.0–3.0)

## 2019-04-09 NOTE — Patient Instructions (Addendum)
Pre visit review using our clinic review tool, if applicable. No additional management support is needed unless otherwise documented below in the visit note.  Continue to take 1 tablet daily except 1/2 tablet on Mon Wed and Fridays.   Re-check in 4 weeks.  

## 2019-04-09 NOTE — Progress Notes (Signed)
Medical screening examination/treatment/procedure(s) were performed by non-physician practitioner and as supervising physician I was immediately available for consultation/collaboration. I agree with above. Myria Steenbergen, MD   

## 2019-04-15 ENCOUNTER — Other Ambulatory Visit: Payer: Self-pay

## 2019-04-16 ENCOUNTER — Ambulatory Visit (INDEPENDENT_AMBULATORY_CARE_PROVIDER_SITE_OTHER): Payer: Medicare Other | Admitting: Family Medicine

## 2019-04-16 ENCOUNTER — Encounter: Payer: Self-pay | Admitting: Family Medicine

## 2019-04-16 VITALS — BP 138/80 | HR 72 | Temp 98.1°F | Ht 66.0 in | Wt 126.4 lb

## 2019-04-16 DIAGNOSIS — I10 Essential (primary) hypertension: Secondary | ICD-10-CM

## 2019-04-16 DIAGNOSIS — I5022 Chronic systolic (congestive) heart failure: Secondary | ICD-10-CM

## 2019-04-16 DIAGNOSIS — I483 Typical atrial flutter: Secondary | ICD-10-CM

## 2019-04-16 DIAGNOSIS — E039 Hypothyroidism, unspecified: Secondary | ICD-10-CM | POA: Diagnosis not present

## 2019-04-16 DIAGNOSIS — E785 Hyperlipidemia, unspecified: Secondary | ICD-10-CM | POA: Diagnosis not present

## 2019-04-16 LAB — CBC WITH DIFFERENTIAL/PLATELET
Basophils Absolute: 0.1 10*3/uL (ref 0.0–0.1)
Basophils Relative: 0.6 % (ref 0.0–3.0)
Eosinophils Absolute: 0.3 10*3/uL (ref 0.0–0.7)
Eosinophils Relative: 2.1 % (ref 0.0–5.0)
HCT: 39.7 % (ref 36.0–46.0)
Hemoglobin: 12.9 g/dL (ref 12.0–15.0)
Lymphocytes Relative: 6.3 % — ABNORMAL LOW (ref 12.0–46.0)
Lymphs Abs: 0.8 10*3/uL (ref 0.7–4.0)
MCHC: 32.5 g/dL (ref 30.0–36.0)
MCV: 95.3 fl (ref 78.0–100.0)
Monocytes Absolute: 1.9 10*3/uL — ABNORMAL HIGH (ref 0.1–1.0)
Monocytes Relative: 15.1 % — ABNORMAL HIGH (ref 3.0–12.0)
Neutro Abs: 9.6 10*3/uL — ABNORMAL HIGH (ref 1.4–7.7)
Neutrophils Relative %: 75.9 % (ref 43.0–77.0)
Platelets: 453 10*3/uL — ABNORMAL HIGH (ref 150.0–400.0)
RBC: 4.16 Mil/uL (ref 3.87–5.11)
RDW: 15.8 % — ABNORMAL HIGH (ref 11.5–15.5)
WBC: 12.7 10*3/uL — ABNORMAL HIGH (ref 4.0–10.5)

## 2019-04-16 LAB — LIPID PANEL
Cholesterol: 213 mg/dL — ABNORMAL HIGH (ref 0–200)
HDL: 61.1 mg/dL (ref >39.00)
LDL Cholesterol: 123 mg/dL — ABNORMAL HIGH (ref 0–99)
NonHDL: 151.71
Total CHOL/HDL Ratio: 3
Triglycerides: 146 mg/dL (ref 0.0–149.0)
VLDL: 29.2 mg/dL (ref 0.0–40.0)

## 2019-04-16 LAB — COMPREHENSIVE METABOLIC PANEL
ALT: 23 U/L (ref 0–35)
AST: 25 U/L (ref 0–37)
Albumin: 4.6 g/dL (ref 3.5–5.2)
Alkaline Phosphatase: 78 U/L (ref 39–117)
BUN: 21 mg/dL (ref 6–23)
CO2: 30 mEq/L (ref 19–32)
Calcium: 10.5 mg/dL (ref 8.4–10.5)
Chloride: 99 mEq/L (ref 96–112)
Creatinine, Ser: 1.02 mg/dL (ref 0.40–1.20)
GFR: 53.77 mL/min — ABNORMAL LOW (ref >60.00)
Glucose, Bld: 87 mg/dL (ref 70–99)
Potassium: 4.2 mEq/L (ref 3.5–5.1)
Sodium: 141 mEq/L (ref 135–145)
Total Bilirubin: 0.5 mg/dL (ref 0.2–1.2)
Total Protein: 7.5 g/dL (ref 6.0–8.3)

## 2019-04-16 LAB — TSH: TSH: 0.18 u[IU]/mL — ABNORMAL LOW (ref 0.35–4.50)

## 2019-04-16 NOTE — Progress Notes (Signed)
Phone [phone] In person visit   Subjective:   Grace [name] is a 68 y.o. year old very pleasant female patient who presents for/with See problem oriented charting Chief Complaint  Patient presents with  . Follow-up    ROS- Review of Systems  Constitutional: Negative.   HENT: Negative.   Eyes: Negative.   Respiratory: Negative.   Cardiovascular: Negative.   Gastrointestinal: Negative.   Genitourinary: Negative.   Musculoskeletal: Negative.   Skin: Negative.   Neurological: Negative.   Endo/Heme/Allergies: Negative.   Psychiatric/Behavioral: Negative.      This visit occurred during the SARS-CoV-2 public health emergency.  Safety protocols were in place, including screening questions prior to the visit, additional usage of staff PPE, and extensive cleaning of exam room while observing appropriate contact time as indicated for disinfecting solutions.   Past Medical History-  Patient Active Problem List   Diagnosis Date Noted  . Atrial flutter ([place]) 12/20/2016    Priority: High  . Osteoporosis 01/17/2014    Priority: High  . H/O aortic valve replacement and mitral valve replacement. 01/07/2013    Priority: High  . WOLFF (WOLFE)-PARKINSON-WHITE (WPW) SYNDROME 08/16/2009    Priority: High  . Congestive heart failure ([place]) 08/03/2009    Priority: High  . TRANSIENT ISCHEMIC ATTACKS, HX OF 04/27/2007    Priority: High  . History of Hodgkin's disease 11/03/2006    Priority: High  . History of adenomatous polyp of colon 02/26/2019    Priority: Medium  . Hypertension 12/20/2016    Priority: Medium  . CKD (chronic kidney disease), stage III 01/18/2014    Priority: Medium  . Iron deficiency anemia 08/16/2009    Priority: Medium  . Hyperlipidemia 08/29/2008    Priority: Medium  . Hypothyroidism 11/03/2006    Priority: Medium  . COMMON MIGRAINE 11/03/2006    Priority: Medium  . BREAST CANCER, HX OF 11/03/2006    Priority: Medium  . Long term (current) use of  anticoagulants 12/23/2012    Priority: Low  . GASTROINTESTINAL HEMORRHAGE, HX OF 11/03/2006    Priority: Low  . Acute left-sided low back pain without sciatica 01/08/2019  . Thoracic compression fracture, with delayed healing, subsequent encounter 12/10/2018  . Compression fracture of thoracic spine, non-traumatic, initial encounter ([place]) 11/09/2018  . Acute midline thoracic back pain 11/03/2018  . Sacroiliac pain 10/12/2018  . Baker cyst, left 10/12/2018  . Microcytic anemia 09/17/2018  . Hx of long-term (current) use of anticoagulants 07/22/2018  . Hypercalcemia 12/23/2017    Medications- reviewed and updated Current Outpatient Medications  Medication Sig Dispense Refill  . aspirin 81 MG tablet Take 81 mg by mouth daily.      Marland Kitchen aspirin-acetaminophen-caffeine (EXCEDRIN MIGRAINE) 250-250-65 MG tablet Take 2 tablets by mouth every 6 (six) hours as needed for headache.    . cholecalciferol (VITAMIN D3) 25 MCG (1000 UT) tablet Take 1,000 Units by mouth daily.    Marland Kitchen denosumab (PROLIA) 60 MG/ML SOSY injection     . diphenhydramine-acetaminophen (TYLENOL PM) 25-500 MG TABS tablet Take 1 tablet by mouth at bedtime as needed (sleep).     . docusate sodium (COLACE) 100 MG capsule Take 100 mg by mouth daily.     Marland Kitchen ezetimibe (ZETIA) 10 MG tablet Take 10 mg by mouth daily.     . ferrous sulfate 325 (65 FE) MG tablet Take 325 mg by mouth 2 (two) times a day.     . furosemide (LASIX) 40 MG tablet TAKE 1 TABLET BY MOUTH  DAILY 90 tablet 1  . levothyroxine (SYNTHROID) 112 MCG tablet TAKE 1 TABLET BY MOUTH  DAILY (Patient taking differently: Take 112 mcg by mouth daily before breakfast. ) 90 tablet 3  . Magnesium 250 MG TABS Take 250 mg by mouth daily.     . metoprolol (TOPROL-XL) 50 MG 24 hr tablet Take 50 mg by mouth 2 (two) times daily.     Marland Kitchen omeprazole (PRILOSEC) 40 MG capsule Take 1 capsule (40 mg total) by mouth daily. Take 30 minutes before breakfast or dinner. 30 capsule 4  . potassium chloride  SA (KLOR-CON) 20 MEQ tablet TAKE 1 TABLET BY MOUTH  DAILY 90 tablet 3  . spironolactone (ALDACTONE) 25 MG tablet Take 25 mg by mouth 2 (two) times daily.     Marland Kitchen warfarin (COUMADIN) 5 MG tablet Take 0.5-1 tablets (2.5-5 mg total) by mouth See admin instructions. Take 5 mg by mouth daily except Fridays take 2.5 mg (Patient taking differently: Take 2.5-5 mg by mouth See admin instructions. Take 5 mg by mouth daily except Mondays and Fridays take 2.5 mg) 100 tablet 1  . enoxaparin (LOVENOX) 60 MG/0.6ML injection Inject 60 mg into the skin every 12 (twelve) hours.     No current facility-administered medications for this visit.      Objective:  BP 138/80   Pulse 72   Temp 98.1 F (36.7 C) (Oral)   Ht 5\' 6"  (1.676 m)   Wt 126 lb 6.4 oz (57.3 kg)   SpO2 97%   BMI 20.40 kg/m  Gen: NAD, resting comfortably CV: RRR- mechanical heart sounds noted Lungs: CTAB no crackles, wheeze, rhonchi Abdomen: soft/nontender/nondistended/normal bowel sounds.thin Ext: no edema but varicose veins noted Left >right Skin: warm, dry     Assessment and Plan   #History of Hodgkin's disease in 1970s-completed chemotherapy and radiation  #Colonoscopy and small bowel endoscopy February 22, 2019-no obvious cause for bleeding found.  Did have adenomatous polyp with plan to repeat in 3 years so October 2023.  Update CBC today- iron levels being monitored by GI.  History of iron deficiency as well-question if related to prior radiation   #venous insufficency- trying to wear her compression stockings  #annual GYN visit next month,dermatology in February   #Cardiovascular issues followed by [facility] cardiology- last visit in July S:  1.  History of Wolff-Parkinson-White syndrome 2.  History of aortic and mitral valve replacement mechanical-on long-term Coumadin followed at our clinic.  3.  Atrial flutter -on Coumadin for anticoagulation and metoprolol for rate control.. 4.  Restrictive CHF 01/05/2019 EF up to 50%-has  shortness of breath with stairs.  Compliant with Lasix 40 mg daily and spironolactone 25 mg daily.  Denies worsening edema or shortness of breath.  Requires potassium supplement despite spironolactone A/P: all issues stable- continue current meds and [facility] follow up - sees in february  #hypothyroidism S: compliant On thyroid medication-levothyroxine 112 mcg Lab Results  Component Value Date   TSH 0.55 10/16/2018  A/P: Stable. Continue current medications.    #CKD stage III S: Patient knows to avoid NSAIDs.  Blood pressure well controlled  .  GFR in mid 50s on last check A/P: hopefully stable- update today   #Osteoporosis S:Patient has transitioned care to Dr. Cruzita Lederer.  She has been started on Prolia (started November 5th 2020)  due to worsening bone density in late 2020 along with compression fracture t6-t7 (did well with kyphoplasty) .  She is compliant with calcium and vitamin D.  Previously had  been on Boniva for 5 years. A/P: doing well at present- continue close follow up with endocrinology   #Musculoskeletal issues-follows with Dr. [name] as needed for issues such as back/hip/groin pain- ended up having compression fracture in thoracic spine    #hyperlipidemia S: compliant with Zetia 10 mg.  Statin intolerant -History of TIA so  ideally should target LDL under 70 Lab Results  Component Value Date   CHOL 229 (H) 10/28/2017   HDL 56.30 10/28/2017   LDLCALC 146 (H) 10/28/2017   LDLDIRECT 68.0 04/28/2018   TRIG 133.0 10/28/2017   CHOLHDL 4 10/28/2017   A/P: update lipids today- hopeful ldl under 70   #hypertension S: compliant with HF meds BP Readings from Last 3 Encounters:  04/16/19 138/80  03/01/19 (!) 100/50  02/22/19 124/63  A/P: controlled but high normal- continue current medicines- some variability in BP in past and do not feel strongly about increasing rx   Recommended follow up: Return in about 6 months (around 10/14/2019) for annual wellness visit with me and  regular follow up or sooner if needed. Future Appointments  Date Time Provider [place]  05/07/2019 11:00 AM [facility]  07/26/2019  9:00 AM Philemon Kingdom, MD [facility] None   Lab/Order associations: not Fasting    ICD-10-CM   1. Hyperlipidemia, unspecified hyperlipidemia type  E78.5 CBC with Differential/Platelet    Comprehensive metabolic panel    Lipid panel  2. Hypothyroidism, unspecified type  E03.9 TSH  3. Chronic systolic congestive heart failure (HCC)  I50.22   4. Typical atrial flutter (HCC)  I48.3     No orders of the defined types were placed in this encounter.   Return precautions advised.  Garret Reddish, MD

## 2019-04-16 NOTE — Patient Instructions (Addendum)
Please stop by lab before you go If you do not have mychart- we will call you about results within 5 business days of [place] receiving them.  If you have mychart- we will send your results within 3 business days of [place] receiving them.  If abnormal or we want to clarify a result, we will call or mychart you to make sure you receive the message.  If you have questions or concerns or don't hear within 5-7 days, please send [place] a message or call us.   Glad you are finally in a better spot! Hope this will keep up for the next 6 months- you deserve a breather!   Recommended follow up:  Return in about 6 months (around 10/14/2019).

## 2019-04-20 ENCOUNTER — Other Ambulatory Visit: Payer: Self-pay

## 2019-04-20 DIAGNOSIS — E039 Hypothyroidism, unspecified: Secondary | ICD-10-CM

## 2019-04-20 MED ORDER — LEVOTHYROXINE SODIUM 100 MCG PO TABS: 100.0000 ug | ORAL_TABLET | Freq: Every day | ORAL | 3 refills | Status: DC

## 2019-05-05 ENCOUNTER — Other Ambulatory Visit: Payer: Self-pay

## 2019-05-05 MED ORDER — WARFARIN SODIUM 5 MG PO TABS: 2.5000 mg | ORAL_TABLET | ORAL | 1 refills | Status: DC

## 2019-05-06 ENCOUNTER — Encounter: Payer: Self-pay | Admitting: Family Medicine

## 2019-05-06 NOTE — Telephone Encounter (Signed)
LVM FOR PATIENT TO CALL BACK AND R/S LAB ABOUT FOR  6 TO 7 WEEKS OUT.

## 2019-05-07 ENCOUNTER — Other Ambulatory Visit: Payer: Self-pay

## 2019-05-07 ENCOUNTER — Ambulatory Visit (INDEPENDENT_AMBULATORY_CARE_PROVIDER_SITE_OTHER): Payer: Medicare Other | Admitting: General Practice

## 2019-05-07 DIAGNOSIS — Z7901 Long term (current) use of anticoagulants: Secondary | ICD-10-CM | POA: Diagnosis not present

## 2019-05-07 LAB — POCT INR: INR: 2.2 (ref 2.0–3.0)

## 2019-05-07 NOTE — Progress Notes (Signed)
Medical screening examination/treatment/procedure(s) were performed by non-physician practitioner and as supervising physician I was immediately available for consultation/collaboration. I agree with above. Shaquana Buel, MD   

## 2019-05-07 NOTE — Patient Instructions (Addendum)
Pre visit review using our clinic review tool, if applicable. No additional management support is needed unless otherwise documented below in the visit note.  Take 1 tablet today (5 mg) and then change dosage and take 1 tablet daily except 1/2 tablet on Mon  and Fridays.  Re-check in 4 weeks.

## 2019-05-10 ENCOUNTER — Other Ambulatory Visit: Payer: Self-pay

## 2019-05-10 MED ORDER — FUROSEMIDE 40 MG PO TABS: 40.0000 mg | ORAL_TABLET | Freq: Every day | ORAL | 1 refills | Status: DC

## 2019-05-10 MED ORDER — WARFARIN SODIUM 5 MG PO TABS: 2.5000 mg | ORAL_TABLET | ORAL | 1 refills | Status: DC

## 2019-05-14 ENCOUNTER — Other Ambulatory Visit: Payer: Self-pay

## 2019-05-14 MED ORDER — POTASSIUM CHLORIDE CRYS ER 20 MEQ PO TBCR: 20.0000 meq | EXTENDED_RELEASE_TABLET | Freq: Every day | ORAL | 3 refills | Status: DC

## 2019-05-14 MED ORDER — LEVOTHYROXINE SODIUM 100 MCG PO TABS: 100.0000 ug | ORAL_TABLET | Freq: Every day | ORAL | 3 refills | Status: DC

## 2019-05-14 MED ORDER — FUROSEMIDE 40 MG PO TABS: 40.0000 mg | ORAL_TABLET | Freq: Every day | ORAL | 1 refills | Status: DC

## 2019-05-17 ENCOUNTER — Other Ambulatory Visit: Payer: Self-pay | Admitting: Gastroenterology

## 2019-05-17 ENCOUNTER — Other Ambulatory Visit (INDEPENDENT_AMBULATORY_CARE_PROVIDER_SITE_OTHER): Payer: Medicare Other

## 2019-05-17 DIAGNOSIS — D509 Iron deficiency anemia, unspecified: Secondary | ICD-10-CM

## 2019-05-17 LAB — IBC + FERRITIN
Ferritin: 199.9 ng/mL (ref 10.0–291.0)
Iron: 76 ug/dL (ref 42–145)
Saturation Ratios: 22.9 % (ref 20.0–50.0)
Transferrin: 237 mg/dL (ref 212.0–360.0)

## 2019-05-17 LAB — CBC WITH DIFFERENTIAL/PLATELET
Basophils Absolute: 0.1 10*3/uL (ref 0.0–0.1)
Basophils Relative: 0.9 % (ref 0.0–3.0)
Eosinophils Absolute: 0.2 10*3/uL (ref 0.0–0.7)
Eosinophils Relative: 1.6 % (ref 0.0–5.0)
HCT: 37.5 % (ref 36.0–46.0)
Hemoglobin: 12.4 g/dL (ref 12.0–15.0)
Lymphocytes Relative: 4.6 % — ABNORMAL LOW (ref 12.0–46.0)
Lymphs Abs: 0.5 10*3/uL — ABNORMAL LOW (ref 0.7–4.0)
MCHC: 33 g/dL (ref 30.0–36.0)
MCV: 94.8 fl (ref 78.0–100.0)
Monocytes Absolute: 1.4 10*3/uL — ABNORMAL HIGH (ref 0.1–1.0)
Monocytes Relative: 12.9 % — ABNORMAL HIGH (ref 3.0–12.0)
Neutro Abs: 8.6 10*3/uL — ABNORMAL HIGH (ref 1.4–7.7)
Neutrophils Relative %: 80 % — ABNORMAL HIGH (ref 43.0–77.0)
Platelets: 408 10*3/uL — ABNORMAL HIGH (ref 150.0–400.0)
RBC: 3.96 Mil/uL (ref 3.87–5.11)
RDW: 14.4 % (ref 11.5–15.5)
WBC: 10.7 10*3/uL — ABNORMAL HIGH (ref 4.0–10.5)

## 2019-05-18 ENCOUNTER — Other Ambulatory Visit: Payer: Self-pay

## 2019-05-18 DIAGNOSIS — D509 Iron deficiency anemia, unspecified: Secondary | ICD-10-CM

## 2019-05-31 ENCOUNTER — Other Ambulatory Visit: Payer: Self-pay

## 2019-05-31 ENCOUNTER — Encounter: Payer: Self-pay | Admitting: Family Medicine

## 2019-05-31 NOTE — Telephone Encounter (Signed)
Please check that pt's new insurance has updated. Thanks

## 2019-06-02 ENCOUNTER — Telehealth: Payer: Self-pay

## 2019-06-02 NOTE — Telephone Encounter (Signed)
Form received for surgical clearance not sure if it should come from [place] or Cardiology? I have placed form in your review box. Please advise.

## 2019-06-03 ENCOUNTER — Telehealth: Payer: Self-pay | Admitting: Family Medicine

## 2019-06-03 ENCOUNTER — Other Ambulatory Visit: Payer: Self-pay

## 2019-06-03 MED ORDER — POTASSIUM CHLORIDE CRYS ER 20 MEQ PO TBCR: 20.0000 meq | EXTENDED_RELEASE_TABLET | Freq: Every day | ORAL | 3 refills | Status: DC

## 2019-06-03 MED ORDER — FUROSEMIDE 40 MG PO TABS: 40.0000 mg | ORAL_TABLET | Freq: Every day | ORAL | 1 refills | Status: DC

## 2019-06-03 MED ORDER — WARFARIN SODIUM 5 MG PO TABS: 2.5000 mg | ORAL_TABLET | ORAL | 1 refills | Status: DC

## 2019-06-03 MED ORDER — LEVOTHYROXINE SODIUM 100 MCG PO TABS: 100.0000 ug | ORAL_TABLET | Freq: Every day | ORAL | 3 refills | Status: DC

## 2019-06-03 NOTE — Telephone Encounter (Signed)
Pt called stating that [facility] is requesting new prescriptions on Furosemide 40 mg, Levothyroxine 100 mcg, Potassium Chloride 20 MEQ, and warfarin. Pt is concerned about mail order. [employer] faxed over request. Please advise.

## 2019-06-03 NOTE — Telephone Encounter (Signed)
Called patient let know scripts have been called in

## 2019-06-03 NOTE — Telephone Encounter (Signed)
I can sign off from medical perspective but she will need cardiology to sign off.  Grace [name] usually helps with her Coumadin bridge with Lovenox

## 2019-06-04 ENCOUNTER — Ambulatory Visit: Payer: Medicare Other

## 2019-06-04 ENCOUNTER — Ambulatory Visit (INDEPENDENT_AMBULATORY_CARE_PROVIDER_SITE_OTHER): Payer: Medicare PPO | Admitting: General Practice

## 2019-06-04 ENCOUNTER — Other Ambulatory Visit: Payer: Self-pay

## 2019-06-04 DIAGNOSIS — Z7901 Long term (current) use of anticoagulants: Secondary | ICD-10-CM | POA: Diagnosis not present

## 2019-06-04 LAB — POCT INR: INR: 3.7 — AB (ref 2.0–3.0)

## 2019-06-04 NOTE — Telephone Encounter (Signed)
Please call for appointment ?

## 2019-06-04 NOTE — Patient Instructions (Addendum)
.  lbpcmh  Skip dosage today and then continue to take 1 tablet daily except 1/2 tablet on Mon  and Fridays.  Re-check in 3 weeks.

## 2019-06-04 NOTE — Telephone Encounter (Signed)
Patient has been scheduled for 06/07/19

## 2019-06-04 NOTE — Progress Notes (Signed)
Medical screening examination/treatment/procedure(s) were performed by non-physician practitioner and as supervising physician I was immediately available for consultation/collaboration. I agree with above. James John, MD   

## 2019-06-07 ENCOUNTER — Ambulatory Visit: Payer: Medicare PPO | Admitting: Family Medicine

## 2019-06-10 ENCOUNTER — Telehealth: Payer: Self-pay | Admitting: General Practice

## 2019-06-10 NOTE — Telephone Encounter (Signed)
I spoke with Margreta Journey, RN with cardiology at [facility] about holding coumadin for a upcoming tooth extraction.  Cardiologist is OK with patient's INR at 2.0 for procedure without a Lovenox bridge.  Patient is to call me Villa Herb, RN) with a procedure date and pt will come into the office and have INR checked  2 days before scheduled procedure. If any questions contact C. Luciana Axe, RN @ [phone] or Margreta Journey, RN @ [facility] [phone].

## 2019-06-23 ENCOUNTER — Telehealth: Payer: Self-pay

## 2019-06-23 NOTE — Telephone Encounter (Signed)
We kept getting an "Error" report when it was faxed which normally means the fax did not go through.  I actually called the clinic this morning to ask if they had another fax number and explained what has been happening. They checked and confirmed they did actually receive it.

## 2019-06-23 NOTE — Telephone Encounter (Signed)
Grace [name] from [facility] calling to make [place] aware that a medical clearance form that was faxed to their office has been faxed multiple times and they have what they need-good contact number is [phone]

## 2019-06-24 ENCOUNTER — Other Ambulatory Visit: Payer: Self-pay

## 2019-06-25 ENCOUNTER — Ambulatory Visit (INDEPENDENT_AMBULATORY_CARE_PROVIDER_SITE_OTHER): Payer: Medicare PPO | Admitting: General Practice

## 2019-06-25 ENCOUNTER — Other Ambulatory Visit (INDEPENDENT_AMBULATORY_CARE_PROVIDER_SITE_OTHER): Payer: Medicare PPO

## 2019-06-25 DIAGNOSIS — Z952 Presence of prosthetic heart valve: Secondary | ICD-10-CM | POA: Diagnosis not present

## 2019-06-25 DIAGNOSIS — E039 Hypothyroidism, unspecified: Secondary | ICD-10-CM

## 2019-06-25 DIAGNOSIS — Z7901 Long term (current) use of anticoagulants: Secondary | ICD-10-CM

## 2019-06-25 LAB — POCT INR: INR: 3.6 — AB (ref 2.0–3.0)

## 2019-06-25 NOTE — Progress Notes (Signed)
Medical screening examination/treatment/procedure(s) were performed by non-physician practitioner and as supervising physician I was immediately available for consultation/collaboration. I agree with above. Gloria Ricardo, MD   

## 2019-06-25 NOTE — Patient Instructions (Addendum)
Pre visit review using our clinic review tool, if applicable. No additional management support is needed unless otherwise documented below in the visit note. Skip dosage today and then change dosage and take 1 tablet daily except 1/2 tablet on Mon Wed and Fridays.  Re-check in 3 weeks.

## 2019-06-28 LAB — TSH: TSH: 0.53 u[IU]/mL (ref 0.35–4.50)

## 2019-07-05 ENCOUNTER — Other Ambulatory Visit: Payer: Self-pay

## 2019-07-05 ENCOUNTER — Ambulatory Visit: Payer: Medicare PPO | Admitting: General Practice

## 2019-07-05 DIAGNOSIS — Z952 Presence of prosthetic heart valve: Secondary | ICD-10-CM

## 2019-07-05 DIAGNOSIS — Z7901 Long term (current) use of anticoagulants: Secondary | ICD-10-CM

## 2019-07-05 LAB — POCT INR: INR: 3.1 — AB (ref 2.0–3.0)

## 2019-07-05 NOTE — Patient Instructions (Signed)
Pre visit review using our clinic review tool, if applicable. No additional management support is needed unless otherwise documented below in the visit note.  Hold coumadin today and tomorrow.  On Wednesday take 1 tablet, take 1 1/2 tablets on Thursday and 1 tablet on Friday.  On Saturday continue to take 1 tablet daily except 1/2 tablet on Mon Wed and Fridays.  Re-check on 2/16 @ [facility].

## 2019-07-13 ENCOUNTER — Ambulatory Visit: Payer: Medicare PPO | Admitting: General Practice

## 2019-07-13 ENCOUNTER — Other Ambulatory Visit: Payer: Self-pay

## 2019-07-13 DIAGNOSIS — Z952 Presence of prosthetic heart valve: Secondary | ICD-10-CM

## 2019-07-13 DIAGNOSIS — Z7901 Long term (current) use of anticoagulants: Secondary | ICD-10-CM

## 2019-07-13 LAB — POCT INR: INR: 2.5 (ref 2.0–3.0)

## 2019-07-13 NOTE — Patient Instructions (Addendum)
Pre visit review using our clinic review tool, if applicable. No additional management support is needed unless otherwise documented below in the visit note.  Continue to take 1 tablet daily except 1/2 tablet on Mon Wed and Fridays.   Re-check in 4 weeks.  

## 2019-07-13 NOTE — Progress Notes (Signed)
Medical screening examination/treatment/procedure(s) were performed by non-physician practitioner and as supervising physician I was immediately available for consultation/collaboration. I agree with above. Keerstin Bjelland, MD   

## 2019-07-14 ENCOUNTER — Ambulatory Visit: Payer: Medicare PPO

## 2019-07-20 ENCOUNTER — Other Ambulatory Visit (INDEPENDENT_AMBULATORY_CARE_PROVIDER_SITE_OTHER): Payer: Medicare PPO

## 2019-07-20 DIAGNOSIS — D509 Iron deficiency anemia, unspecified: Secondary | ICD-10-CM

## 2019-07-20 LAB — CBC WITH DIFFERENTIAL/PLATELET
Basophils Absolute: 0.2 10*3/uL — ABNORMAL HIGH (ref 0.0–0.1)
Basophils Relative: 1.4 % (ref 0.0–3.0)
Eosinophils Absolute: 0.3 10*3/uL (ref 0.0–0.7)
Eosinophils Relative: 2.2 % (ref 0.0–5.0)
HCT: 38.7 % (ref 36.0–46.0)
Hemoglobin: 12.4 g/dL (ref 12.0–15.0)
Lymphocytes Relative: 5.4 % — ABNORMAL LOW (ref 12.0–46.0)
Lymphs Abs: 0.7 10*3/uL (ref 0.7–4.0)
MCHC: 31.9 g/dL (ref 30.0–36.0)
MCV: 94.1 fl (ref 78.0–100.0)
Monocytes Absolute: 1.7 10*3/uL — ABNORMAL HIGH (ref 0.1–1.0)
Monocytes Relative: 13.5 % — ABNORMAL HIGH (ref 3.0–12.0)
Neutro Abs: 9.6 10*3/uL — ABNORMAL HIGH (ref 1.4–7.7)
Neutrophils Relative %: 77.5 % — ABNORMAL HIGH (ref 43.0–77.0)
Platelets: 487 10*3/uL — ABNORMAL HIGH (ref 150.0–400.0)
RBC: 4.11 Mil/uL (ref 3.87–5.11)
RDW: 14.9 % (ref 11.5–15.5)
WBC: 12.4 10*3/uL — ABNORMAL HIGH (ref 4.0–10.5)

## 2019-07-20 LAB — FERRITIN: Ferritin: 121.9 ng/mL (ref 10.0–291.0)

## 2019-07-20 LAB — IRON, TOTAL/TOTAL IRON BINDING CAP
%SAT: 33 % (calc) (ref 16–45)
Iron: 116 ug/dL (ref 45–160)
TIBC: 352 mcg/dL (calc) (ref 250–450)

## 2019-07-20 LAB — IRON: Iron: 133 ug/dL (ref 42–145)

## 2019-07-22 ENCOUNTER — Other Ambulatory Visit: Payer: Self-pay

## 2019-07-22 ENCOUNTER — Telehealth: Payer: Self-pay | Admitting: Gastroenterology

## 2019-07-23 DIAGNOSIS — E782 Mixed hyperlipidemia: Secondary | ICD-10-CM | POA: Diagnosis not present

## 2019-07-23 DIAGNOSIS — I2510 Atherosclerotic heart disease of native coronary artery without angina pectoris: Secondary | ICD-10-CM | POA: Diagnosis not present

## 2019-07-23 DIAGNOSIS — I4892 Unspecified atrial flutter: Secondary | ICD-10-CM | POA: Diagnosis not present

## 2019-07-23 NOTE — Telephone Encounter (Signed)
Pt informed to continue same regimen until she see's Dr Rush Landmark in April 2021.

## 2019-07-26 ENCOUNTER — Ambulatory Visit: Payer: Medicare PPO | Admitting: Internal Medicine

## 2019-07-26 ENCOUNTER — Other Ambulatory Visit: Payer: Self-pay

## 2019-07-26 ENCOUNTER — Encounter: Payer: Self-pay | Admitting: Internal Medicine

## 2019-07-26 VITALS — BP 108/60 | HR 86 | Ht 63.5 in | Wt 128.0 lb

## 2019-07-26 DIAGNOSIS — M810 Age-related osteoporosis without current pathological fracture: Secondary | ICD-10-CM

## 2019-07-26 LAB — VITAMIN D 25 HYDROXY (VIT D DEFICIENCY, FRACTURES): VITD: 50.5 ng/mL (ref 30.00–100.00)

## 2019-07-26 NOTE — Patient Instructions (Addendum)
Please continue vitamin D 1000 units daily.  Please stop at the lab.  Please come back for a follow-up appointment in 1 year.

## 2019-07-26 NOTE — Progress Notes (Signed)
Patient ID: Grace [name], female   DOB: 11-10-1950, 69 y.o.   MRN: [id number]   This visit occurred during the SARS-CoV-2 public health emergency.  Safety protocols were in place, including screening questions prior to the visit, additional usage of staff PPE, and extensive cleaning of exam room while observing appropriate contact time as indicated for disinfecting solutions.   HPI  Grace [name] is a 69 y.o.-year-old female, returning for follow-up for osteoporosis (OP) with history of vertebral compression fractures and also hypercalcemia.  Last visit 6 months ago.  Pt was dx with OP in 2014.  Reviewed patient's DXA scan reports: Date L1-L2 T score FN T score 33% distal Radius  03/22/2019  -1.6 RFN: -2.7 LFN: -2.4 n/a  03/14/2017  -1.2 (+2.7%) RFN: -2.3 (-2.1%) LFN: -1.5 (+5.7%*) n/a  03/13/2015  -1.4 RFN: -2.2 LFN: -1.9 n/a  02/08/2013  -2.5 RFN: -1.8 LFN: -1.7 n/a   Previous fractures: - sacroiliac insufficiency fracture 06/2018 - from cough-diagnosed by Dr. Gardenia Phlegm - T6 an T7 vertebrae 10/2018 - from picking up a slipper; s/p kyphoplasty 12/10/2018 >> pain almost entirely resolved after the procedure  No recent falls.   She denies dizziness/vertigo/orthostasis/poor vision.   Previous OP treatments:  - Boniva 150 mg/month -started 2014 - Prolia -started 04/01/2019  She had a wisdom tooth extracted recently without any complications.  No history of vitamin D deficiency.  Reviewed vit D levels: Lab Results  Component Value Date   VD25OH 53.58 10/16/2018   VD25OH 58.08 12/03/2017   VD25OH 59.80 10/28/2017   VD25OH 52.49 06/26/2016   VD25OH 67.67 10/16/2015   VD25OH 70.08 01/17/2014   At last visit she was on a multivitamin with calcium, which we stopped.  We changed to vitamin D 1000 mcg daily.  No weightbearing exercises.  She was walking on the treadmill and being active in the yard before her fx. She restarted to work in her yard.  She does not take high  vitamin A doses.  Menopause was at 69 y/o.   FH of osteoporosis: mother  - h/o vertebral fracture.  She has a history of hypercalcemia, was on calcium supplements.  No history of kidney stones. 07/23/2019 ([facility]): Ca 9.1 Lab Results  Component Value Date   PTH 37 11/10/2018   PTH 25 10/16/2018   PTH 16 11/25/2017   CALCIUM 10.5 04/16/2019   CALCIUM 10.5 (H) 01/26/2019   CALCIUM 9.7 12/07/2018   CALCIUM 10.5 (H) 11/10/2018   CALCIUM 10.2 10/16/2018   CALCIUM 10.5 06/30/2018   CALCIUM 9.8 04/28/2018   CALCIUM 10.1 01/02/2018   CALCIUM 11.0 (H) 11/25/2017   CALCIUM 11.3 (H) 10/28/2017  11/10/2018: Ionized calcium 5.29 (4.8-5.6), for total calcium of 10.5 (8.6-10.4). 11/28/2017: ionized calcium 5.8 (4.8-5.6), or low calcium   No history of thyrotoxicosis.  She actually has hypothyroidism and is on levothyroxine 100 mcg daily.  Recent TSH was normal:  Lab Results  Component Value Date   TSH 0.53 06/25/2019   TSH 0.18 (L) 04/16/2019   TSH 0.55 10/16/2018   TSH 1.02 04/28/2018   TSH 0.62 10/28/2017   + Mild CKD. Last BUN/Cr: 07/23/2019: 15/1.1 Lab Results  Component Value Date   BUN 21 04/16/2019   CREATININE 1.02 04/16/2019   She had an SPEP that only showed an increased alpha-1 protein but not an M spike.  Of note, she has a history of Hodgkin lymphoma, s/p ChTx (MOPP -cardiovascular disease, lung fibrosis and hypothyroidism as complications) and RxTx - mantle radiation (  Dr. Charlcie Cradle oncologist, Dr. Pilar Plate Batley-radiologist). She had L breast cancer (Dr. Nemiah Commander) >> started Tamoxifen >> increased bleeding >> endometrial ablation >> menopause 69 y/o. She also has a history of CHF (Dr. Venetia Maxon - [facility]), GI bleed (Dr Rush Landmark).  ROS: Constitutional: no weight gain/no weight loss, fatigue, no subjective hyperthermia, no subjective hypothermia Eyes: no blurry vision, no xerophthalmia ENT: no sore throat, no nodules palpated in neck, no dysphagia, no odynophagia,  no hoarseness Cardiovascular: no CP/no SOB/no palpitations/leg swelling Respiratory: no cough/no SOB/no wheezing Gastrointestinal: no N/no V/no D/no C/no acid reflux Musculoskeletal: no muscle aches/+ joint aches (back) Skin: no rashes, no hair loss Neurological: no tremors/no numbness/no tingling/no dizziness  I reviewed pt's medications, allergies, PMH, social hx, family hx, and changes were documented in the history of present illness. Otherwise, unchanged from my initial visit note.  Past Medical History:  Diagnosis Date  . Anemia   . Aortic insufficiency   . AORTIC VALVE REPLACEMENT, HX OF 04/27/2007   Qualifier: Diagnosis of  By: Leanne Chang MD, Bruce    . Cancer [facility])    left breast- surgery and chemo  . CHF (congestive heart failure) ([place])   . Chronic kidney disease   . Congestive heart failure ([place]) 08/03/2009   [facility] Dr. Paul Half. Last echo 2010 with EF 30%. Restrictive due to radiation. AVR MVR. 02/2014 visit planned.    . Coronary artery disease    11/06/06 [facility]): 40% oLM, 60% dLAD, 30% oRCA  . Dysrhythmia    paroxsymal a-flutter 2018; intermittent left BBB (2016)  . Edema of both feet   . GI bleed   . Heart murmur    mitral and aortic valve replacement  . Hodgkin's disease    ~1974, s/p Mantle field radiation and chemo  . Hyperlipidemia   . Hypothyroidism   . Migraine    none since 2008  . MITRAL VALVE REPLACEMENT, HX OF 04/27/2007   Qualifier: Diagnosis of  By: Leanne Chang MD, Bruce    . Pericarditis    s/p pericardidal stripping through lateral thoracotomy 01/2007 [facility])  . Stroke [facility]) 2008   No residual effects  . TIA (transient ischemic attack)   . Tricuspid regurgitation   . Varicose veins of both lower extremities    left worse than right- have gotten larger in April 2020  . WPW (Wolff-Parkinson-White syndrome)    Past Surgical History:  Procedure Laterality Date  . AORTIC VALVE SURGERY     19 mm mechanical [employer] AVR 05/2006 [facility], Dr. Lajuana Matte)  .  bilateral breast implants    . BIOPSY  02/22/2019   Procedure: BIOPSY;  Surgeon: Rush Landmark Telford Nab., MD;  Location: [place];  Service: Gastroenterology;;  . COLONOSCOPY WITH PROPOFOL N/A 02/22/2019   Procedure: COLONOSCOPY WITH PROPOFOL;  Surgeon: Irving Copas., MD;  Location: [place];  Service: Gastroenterology;  Laterality: N/A;  . ENDOMETRIAL ABLATION    . ENTEROSCOPY N/A 02/22/2019   Procedure: ENTEROSCOPY;  Surgeon: Mansouraty, Telford Nab., MD;  Location: [place];  Service: Gastroenterology;  Laterality: N/A;  . EXPLORATORY LAPAROTOMY    . HEMOSTASIS CLIP PLACEMENT  02/22/2019   Procedure: HEMOSTASIS CLIP PLACEMENT;  Surgeon: Irving Copas., MD;  Location: [place];  Service: Gastroenterology;;  . KYPHOPLASTY N/A 12/10/2018   Procedure: KYPHOPLASTY THORACIC SIX- THORACIC SEVEN;  Surgeon: Newman Pies, MD;  Location: [place];  Service: Neurosurgery;  Laterality: N/A;  KYPHOPLASTY THORACIC SIX- THORACIC SEVEN  . MASTECTOMY    . MITRAL VALVE REPLACEMENT  25 mm [employer] MVR, 05/2006, Dr. Lajuana Matte, [facility]  . pericardial stripping  9/08  . POLYPECTOMY  02/22/2019   Procedure: POLYPECTOMY;  Surgeon: Mansouraty, Telford Nab., MD;  Location: [place];  Service: Gastroenterology;;  . SPLENECTOMY     ~ 1974  . SPLENECTOMY, TOTAL    . SUBMUCOSAL TATTOO INJECTION  02/22/2019   Procedure: SUBMUCOSAL TATTOO INJECTION;  Surgeon: Irving Copas., MD;  Location: [place];  Service: Gastroenterology;;  . THORACOTOMY     for pericardial stripping 01/2007 (Dr. Elenor Quinones, [facility])  . TONSILLECTOMY    . valvulopathy  06/13/06   Social History   Socioeconomic History  . Marital status: Married    Spouse name: Not on file  . Number of children: 0  . Years of education: Not on file  . Highest education level: Not on file  Occupational History  .  Retired  Scientific laboratory technician  . Financial resource strain: Not on file  . Food insecurity    Worry: Not on  file    Inability: Not on file  . Transportation needs    Medical: Not on file    Non-medical: Not on file  Tobacco Use  . Smoking status: Never Smoker  . Smokeless tobacco: Never Used  Substance and Sexual Activity  . Alcohol use: No  . Drug use: No  . Sexual activity: Not on file  Lifestyle  . Physical activity    Days per week: Not on file    Minutes per session: Not on file  . Stress: Not on file  Relationships  . Social Herbalist on phone: Not on file    Gets together: Not on file    Attends religious service: Not on file    Active member of club or organization: Not on file    Attends meetings of clubs or organizations: Not on file    Relationship status: Not on file  . Intimate partner violence    Fear of current or ex partner: Not on file    Emotionally abused: Not on file    Physically abused: Not on file    Forced sexual activity: Not on file  Other Topics Concern  . Not on file  Social History Narrative   Lived in [place] since 1998. Moved here for the weather.    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      Husband retired January 2015. Married 1991 (2nd marriage). No kids.       Hobbies: walks 2 miles per day, read, work outside         Current Outpatient Medications on File Prior to Visit  Medication Sig Dispense Refill  . aspirin 81 MG tablet Take 81 mg by mouth daily.      Marland Kitchen aspirin-acetaminophen-caffeine (EXCEDRIN MIGRAINE) 250-250-65 MG tablet Take 2 tablets by mouth every 6 (six) hours as needed for headache.    . cholecalciferol (VITAMIN D3) 25 MCG (1000 UT) tablet Take 1,000 Units by mouth daily.    Marland Kitchen denosumab (PROLIA) 60 MG/ML SOSY injection     . diphenhydramine-acetaminophen (TYLENOL PM) 25-500 MG TABS tablet Take 1 tablet by mouth at bedtime as needed (sleep).     . docusate sodium (COLACE) 100 MG capsule Take 100 mg by mouth daily.     Marland Kitchen enoxaparin (LOVENOX) 60  MG/0.6ML injection Inject 60 mg into the skin every 12 (twelve) hours.    Marland Kitchen ezetimibe (ZETIA)  10 MG tablet Take 10 mg by mouth daily.     . ferrous sulfate 325 (65 FE) MG tablet Take 325 mg by mouth 2 (two) times a day.     . furosemide (LASIX) 40 MG tablet Take 1 tablet (40 mg total) by mouth daily. 90 tablet 1  . levothyroxine (SYNTHROID) 100 MCG tablet Take 1 tablet (100 mcg total) by mouth daily. 90 tablet 3  . Magnesium 250 MG TABS Take 250 mg by mouth daily.     . metoprolol (TOPROL-XL) 50 MG 24 hr tablet Take 50 mg by mouth 2 (two) times daily.     Marland Kitchen omeprazole (PRILOSEC) 40 MG capsule Take 1 capsule (40 mg total) by mouth daily. Take 30 minutes before breakfast or dinner. 30 capsule 4  . potassium chloride SA (KLOR-CON) 20 MEQ tablet Take 1 tablet (20 mEq total) by mouth daily. 90 tablet 3  . spironolactone (ALDACTONE) 25 MG tablet Take 25 mg by mouth 2 (two) times daily.     Marland Kitchen warfarin (COUMADIN) 5 MG tablet Take 0.5-1 tablets (2.5-5 mg total) by mouth See admin instructions. Take 5 mg by mouth daily except Fridays take 2.5 mg 100 tablet 1   No current facility-administered medications on file prior to visit.   Allergies  Allergen Reactions  . Digoxin Other (See Comments)  . Statins     myalgia   Family History  Problem Relation Age of Onset  . Heart disease Mother   . Cancer Mother        breast  . Heart disease Maternal Aunt   . Cancer Maternal Aunt        uterine  . Heart disease Maternal Grandmother   . Diabetes Maternal Grandmother   . Hypercalcemia Neg Hx   . Colon cancer Neg Hx   . Esophageal cancer Neg Hx   . Liver disease Neg Hx   . Pancreatic cancer Neg Hx   . Rectal cancer Neg Hx   . Stomach cancer Neg Hx     PE: BP 108/60   Pulse 86   Ht 5' 3.5" (1.613 m) Comment: measured today without shoes  Wt 128 lb (58.1 kg)   BMI 22.32 kg/m  Wt Readings from Last 3 Encounters:  07/26/19 128 lb (58.1 kg)  04/16/19 126 lb 6.4 oz (57.3 kg)  02/22/19 125 lb  (56.7 kg)   Constitutional: normal weight, in NAD Eyes: PERRLA, EOMI, no exophthalmos ENT: moist mucous membranes, no thyromegaly, no cervical lymphadenopathy Cardiovascular: RRR, No MRG Respiratory: CTA B Gastrointestinal: abdomen soft, NT, ND, BS+ Musculoskeletal: no deformities, strength intact in all 4 Skin: moist, warm, no rashes Neurological: no tremor with outstretched hands, DTR normal in all 4  Assessment: 1. Osteoporosis - Vertebral compression fractures (T6 and T7- 10/2018), s/p kyphoplasty  2.  Hypercalcemia  Plan: 1. Osteoporosis with  Vertebral compression fracture -Likely postmenopausal/age-related + family history of osteoporosis + also possibly related to her mantle radiation treatment -We reviewed together latest bone density scan from 03/22/2019, which was worse.  This showed osteoporosis, while on the previous scan from 2018, the T-scores were in the osteopenia range.  She still had clinical osteoporosis due to her history of vertebral fractures -At last visit, I recommended treatment as she agreed to start Prolia.  She had the first injection on 04/01/2019, which she tolerated well. -She denies jaw/hip/thigh pain after Prolia injection -At this visit, we discussed about continuing Prolia for at least 6 years, but studies show that we  can safely continue to 10 years -In the meantime, we have to make sure that she gets 1000 to 1200 mg of calcium daily preferentially from the diet.  Last visit we stopped her calcium supplement since her calcium was elevated. -Also, we need to make sure that her vitamin D level is normal.  At last visit, I advised her to only take vitamin D 1000 mcg daily.  We will recheck this today. -We will need to obtain another bone density scan 2 years after starting Prolia.  We discussed that unchanged or slightly higher T-scores are desirable. -At this visit we will check: Orders Placed This Encounter  Procedures  . VITAMIN D 25 Hydroxy (Vit-D  Deficiency, Fractures)  -If labs are normal we will continue with Prolia -I will see her back in a year  3.  Hypercalcemia -Patient has a history of hypercalcemia but the labs were checked when she was on calcium supplements. -She is on 1000 units vitamin D supplement.  At last visit, we stopped her multivitamin with calcium. -At this visit, we reviewed together her latest calcium levels including the one from [facility] 5 days ago.   -This was normal.  We will continue to follow this expectantly  Office Visit on 07/26/2019  Component Date Value Ref Range Status  . VITD 07/26/2019 50.50  30.00 - 100.00 ng/mL Final   Vitamin D remains excellent.  Philemon Kingdom, MD PhD [facility]

## 2019-08-10 ENCOUNTER — Ambulatory Visit: Payer: Medicare PPO | Admitting: General Practice

## 2019-08-10 ENCOUNTER — Other Ambulatory Visit: Payer: Self-pay

## 2019-08-10 DIAGNOSIS — Z7901 Long term (current) use of anticoagulants: Secondary | ICD-10-CM

## 2019-08-10 DIAGNOSIS — Z952 Presence of prosthetic heart valve: Secondary | ICD-10-CM | POA: Diagnosis not present

## 2019-08-10 LAB — POCT INR: INR: 4 — AB (ref 2.0–3.0)

## 2019-08-10 NOTE — Progress Notes (Signed)
Medical screening examination/treatment/procedure(s) were performed by non-physician practitioner and as supervising physician I was immediately available for consultation/collaboration. I agree with above. Saintclair Schroader, MD   

## 2019-08-10 NOTE — Patient Instructions (Signed)
Pre visit review using our clinic review tool, if applicable. No additional management support is needed unless otherwise documented below in the visit note.  Continue to take 1 tablet daily except 1/2 tablet on Mon Wed and Fridays.  Re-check in 3 weeks.

## 2019-08-11 ENCOUNTER — Telehealth: Payer: Self-pay | Admitting: Gastroenterology

## 2019-08-11 MED ORDER — OMEPRAZOLE 40 MG PO CPDR: 40.0000 mg | DELAYED_RELEASE_CAPSULE | Freq: Every day | ORAL | 4 refills | Status: DC

## 2019-08-11 NOTE — Telephone Encounter (Signed)
Rx for Prilosec 90-day supply sent to [facility] mail order pharmacy.

## 2019-08-11 NOTE — Telephone Encounter (Signed)
Patient needs to refill prilosec for 90-day supply. Please send it to [facility] mail order.

## 2019-08-20 ENCOUNTER — Other Ambulatory Visit: Payer: Self-pay | Admitting: Family Medicine

## 2019-08-31 ENCOUNTER — Ambulatory Visit: Payer: Medicare PPO | Admitting: General Practice

## 2019-08-31 ENCOUNTER — Other Ambulatory Visit: Payer: Self-pay

## 2019-08-31 DIAGNOSIS — Z7901 Long term (current) use of anticoagulants: Secondary | ICD-10-CM

## 2019-08-31 DIAGNOSIS — Z952 Presence of prosthetic heart valve: Secondary | ICD-10-CM | POA: Diagnosis not present

## 2019-08-31 LAB — POCT INR: INR: 2.7 (ref 2.0–3.0)

## 2019-08-31 NOTE — Progress Notes (Signed)
Medical screening examination/treatment/procedure(s) were performed by non-physician practitioner and as supervising physician I was immediately available for consultation/collaboration. I agree with above. Elgin Carn, MD   

## 2019-08-31 NOTE — Patient Instructions (Addendum)
Pre visit review using our clinic review tool, if applicable. No additional management support is needed unless otherwise documented below in the visit note.  Continue to take 1 tablet daily except 1/2 tablet on Mon Wed and Fridays.  Re-check in 4 week.

## 2019-09-12 ENCOUNTER — Encounter: Payer: Self-pay | Admitting: Family Medicine

## 2019-09-13 ENCOUNTER — Encounter: Payer: Self-pay | Admitting: Internal Medicine

## 2019-09-14 DIAGNOSIS — L578 Other skin changes due to chronic exposure to nonionizing radiation: Secondary | ICD-10-CM | POA: Diagnosis not present

## 2019-09-16 ENCOUNTER — Ambulatory Visit: Payer: Medicare PPO | Admitting: Gastroenterology

## 2019-09-16 ENCOUNTER — Encounter: Payer: Self-pay | Admitting: Gastroenterology

## 2019-09-16 ENCOUNTER — Other Ambulatory Visit: Payer: Self-pay

## 2019-09-16 VITALS — BP 120/70 | HR 83 | Temp 98.3°F | Ht 60.5 in | Wt 126.0 lb

## 2019-09-16 DIAGNOSIS — D508 Other iron deficiency anemias: Secondary | ICD-10-CM

## 2019-09-16 DIAGNOSIS — K222 Esophageal obstruction: Secondary | ICD-10-CM | POA: Diagnosis not present

## 2019-09-16 DIAGNOSIS — K449 Diaphragmatic hernia without obstruction or gangrene: Secondary | ICD-10-CM

## 2019-09-16 DIAGNOSIS — K209 Esophagitis, unspecified without bleeding: Secondary | ICD-10-CM | POA: Diagnosis not present

## 2019-09-16 DIAGNOSIS — K5730 Diverticulosis of large intestine without perforation or abscess without bleeding: Secondary | ICD-10-CM

## 2019-09-16 DIAGNOSIS — D509 Iron deficiency anemia, unspecified: Secondary | ICD-10-CM

## 2019-09-16 DIAGNOSIS — Z86010 Personal history of colonic polyps: Secondary | ICD-10-CM | POA: Diagnosis not present

## 2019-09-16 DIAGNOSIS — K648 Other hemorrhoids: Secondary | ICD-10-CM

## 2019-09-16 MED ORDER — OMEPRAZOLE 40 MG PO CPDR: 40.0000 mg | DELAYED_RELEASE_CAPSULE | Freq: Every day | ORAL | 3 refills | Status: DC

## 2019-09-16 NOTE — Patient Instructions (Addendum)
Please have Dr [name] draw the labs ordered today with his labs when you have your annual check-up.   Continue Iron twice a day.   Change Omeprazole to once daily. We will send it to your mail-in pharmacy.   I value your feedback and thank you for entrusting [place] with your care. If you get a [place] patient survey, I would appreciate you taking the time to let us know about your experience today. Thank you!   Due to recent changes in healthcare laws, you may see the results of your imaging and laboratory studies on MyChart before your provider has had a chance to review them.  We understand that in some cases there may be results that are confusing or concerning to you. Not all laboratory results come back in the same time frame and the provider may be waiting for multiple results in order to interpret others.  Please give [place] 48 hours in order for your provider to thoroughly review all the results before contacting the office for clarification of your results.    Thank you for trusting me with your gastrointestinal care!    Justice Britain, MD

## 2019-09-18 ENCOUNTER — Encounter: Payer: Self-pay | Admitting: Gastroenterology

## 2019-09-18 DIAGNOSIS — K5730 Diverticulosis of large intestine without perforation or abscess without bleeding: Secondary | ICD-10-CM | POA: Insufficient documentation

## 2019-09-18 DIAGNOSIS — K222 Esophageal obstruction: Secondary | ICD-10-CM | POA: Insufficient documentation

## 2019-09-18 DIAGNOSIS — K648 Other hemorrhoids: Secondary | ICD-10-CM | POA: Insufficient documentation

## 2019-09-18 DIAGNOSIS — K209 Esophagitis, unspecified without bleeding: Secondary | ICD-10-CM | POA: Insufficient documentation

## 2019-09-18 DIAGNOSIS — K449 Diaphragmatic hernia without obstruction or gangrene: Secondary | ICD-10-CM | POA: Insufficient documentation

## 2019-09-18 NOTE — Progress Notes (Signed)
[place] VISIT   Primary Care Provider Marin Olp, [place] [address] [phone]  Patient Profile: Grace [name] is a 69 y.o. female with a pmh significant for CHFrEF, Breast cancer survivor, AVR/MVR (on Coumadin 2.5-3.5), prior Hodgkin's Lymphoma (s/p Radiation & Chemo), Hypothyroidism, HLD, Iron deficiency anemia (s/p PO and IV iron supplementation), vertebral fracture (s/p Kyphoplasty), hiatal hernia, Schatzki ring, GERD with esophagitis, diverticulosis, hemorrhoids, adenomatous colon polyps.  The patient presents to the [facility] for an evaluation and management of problem(s) noted below:  Problem List 1. Other iron deficiency anemia   2. Schatzki's ring   3. Hiatal hernia   4. History of colonic polyps   5. Esophagitis determined by endoscopy   6. Diverticulosis of colon without hemorrhage   7. Other hemorrhoids     History of Present Illness: Please see initial consultation note from [facility] and my subsequent follow-up progress notes for full details of HPI.    Interval History The patient returns for scheduled follow-up.  Since our last visit, the patient underwent push enteroscopy and colonoscopy.  No overt findings for potential iron deficiency were found on her upper or lower endoscopic evaluation although she was found to have some mild esophagitis, a hiatal hernia, Schatzki ring, diverticulosis of her large colon, colon polyps, and hemorrhoids.  She completed her IV iron infusions in the fall 2020 after her endoscopic evaluation had been completed.  In February of this year repeat laboratories were performed.  Hemoglobin was normal at 12.4 with an iron saturation of 33% and a ferritin of 121.  Patient feels well although she is dealt with some issues from a tooth abscess perspective earlier in the year.  Her energy levels are good.  She is not seeing any significant changes in regards to  overt melena or hematochezia.  No new abdominal pains.  She continues to take oral iron.  She continues to take omeprazole once daily.  Symptoms of GERD are not present at this time.  GI Review of Systems Positive as above Negative for odynophagia, dysphagia, pain, nausea, vomiting, change in bowel habits   Review of Systems General: Denies fevers/chills/weight loss Cardiovascular: Denies chest pain/palpitations Pulmonary: Denies shortness of breath Gastroenterological: See HPI Genitourinary: Denies darkened urine or hematuria Hematological: Bruising/bleeding remains stable due to Coumadin use Dermatological: Denies jaundice Musculoskeletal: Lower back pain has improved status post kyphoplasty Psychological: Mood is stable   Medications Current Outpatient Medications  Medication Sig Dispense Refill  . aspirin 81 MG tablet Take 81 mg by mouth daily.      Marland Kitchen aspirin-acetaminophen-caffeine (EXCEDRIN MIGRAINE) 250-250-65 MG tablet Take 2 tablets by mouth every 6 (six) hours as needed for headache.    . cholecalciferol (VITAMIN D3) 25 MCG (1000 UT) tablet Take 1,000 Units by mouth daily.    Marland Kitchen denosumab (PROLIA) 60 MG/ML SOSY injection Inject 60 mg into the skin every 6 (six) months.     . diphenhydramine-acetaminophen (TYLENOL PM) 25-500 MG TABS tablet Take 1 tablet by mouth at bedtime as needed (sleep).     . docusate sodium (COLACE) 100 MG capsule Take 100 mg by mouth daily.     Marland Kitchen ezetimibe (ZETIA) 10 MG tablet Take 10 mg by mouth daily.     . ferrous sulfate 325 (65 FE) MG tablet Take 325 mg by mouth 2 (two) times a day.     . furosemide (LASIX) 40 MG tablet TAKE 1 TABLET EVERY DAY 90 tablet  1  . levothyroxine (SYNTHROID) 100 MCG tablet Take 1 tablet (100 mcg total) by mouth daily. 90 tablet 3  . Magnesium 250 MG TABS Take 250 mg by mouth daily.     . metoprolol (TOPROL-XL) 50 MG 24 hr tablet Take 50 mg by mouth 2 (two) times daily.     Marland Kitchen omeprazole (PRILOSEC) 40 MG capsule Take 1  capsule (40 mg total) by mouth daily. Take 30 minutes before breakfast or dinner. 90 capsule 3  . potassium chloride SA (KLOR-CON) 20 MEQ tablet Take 1 tablet (20 mEq total) by mouth daily. 90 tablet 3  . REPATHA SURECLICK [id number] MG/ML SOAJ Inject 140 mg into the skin every 14 (fourteen) days.    Marland Kitchen spironolactone (ALDACTONE) 25 MG tablet Take 25 mg by mouth 2 (two) times daily.     Marland Kitchen warfarin (COUMADIN) 5 MG tablet TAKE 1 TABLET EVERY DAY EXCEPT TAKE 1/2 TABLET ON FRIDAYS AS DIRECTED 100 tablet 1   No current facility-administered medications for this visit.    Allergies Allergies  Allergen Reactions  . Digoxin Other (See Comments)  . Statins     myalgia    Histories Past Medical History:  Diagnosis Date  . Anemia   . Aortic insufficiency   . AORTIC VALVE REPLACEMENT, HX OF 04/27/2007   Qualifier: Diagnosis of  By: Leanne Chang MD, Bruce    . Cancer [facility] 2)    left breast- surgery and chemo  . CHF (congestive heart failure) ([place])   . Chronic kidney disease   . Congestive heart failure ([place]) 08/03/2009   [facility] Dr. Paul Half. Last echo 2010 with EF 30%. Restrictive due to radiation. AVR MVR. 02/2014 visit planned.    . Coronary artery disease    11/06/06 [facility]): 40% oLM, 60% dLAD, 30% oRCA  . Dysrhythmia    paroxsymal a-flutter 2018; intermittent left BBB (2016)  . Edema of both feet   . GI bleed   . Heart murmur    mitral and aortic valve replacement  . Hodgkin's disease    ~1974, s/p [place] radiation and chemo  . Hyperlipidemia   . Hypothyroidism   . Migraine    none since 2008  . MITRAL VALVE REPLACEMENT, HX OF 04/27/2007   Qualifier: Diagnosis of  By: Leanne Chang MD, Bruce    . Pericarditis    s/p pericardidal stripping through lateral thoracotomy 01/2007 [facility])  . Stroke [facility]) 2008   No residual effects  . TIA (transient ischemic attack)   . Tricuspid regurgitation   . Varicose veins of both lower extremities    left worse than right- have gotten larger in April 2020  . WPW  (Wolff-Parkinson-White syndrome)    Past Surgical History:  Procedure Laterality Date  . AORTIC VALVE SURGERY     19 mm mechanical [employer] AVR 05/2006 [facility], Dr. Lajuana Matte)  . bilateral breast implants    . BIOPSY  02/22/2019   Procedure: BIOPSY;  Surgeon: Rush Landmark Telford Nab., MD;  Location: [place];  Service: Gastroenterology;;  . COLONOSCOPY WITH PROPOFOL N/A 02/22/2019   Procedure: COLONOSCOPY WITH PROPOFOL;  Surgeon: Irving Copas., MD;  Location: [place];  Service: Gastroenterology;  Laterality: N/A;  . ENDOMETRIAL ABLATION    . ENTEROSCOPY N/A 02/22/2019   Procedure: ENTEROSCOPY;  Surgeon: Mansouraty, Telford Nab., MD;  Location: [place];  Service: Gastroenterology;  Laterality: N/A;  . EXPLORATORY LAPAROTOMY    . HEMOSTASIS CLIP PLACEMENT  02/22/2019   Procedure: HEMOSTASIS CLIP PLACEMENT;  Surgeon: Irving Copas., MD;  Location: [place] [facility];  Service: Gastroenterology;;  . KYPHOPLASTY N/A 12/10/2018   Procedure: KYPHOPLASTY THORACIC SIX- THORACIC SEVEN;  Surgeon: Newman Pies, MD;  Location: [place];  Service: Neurosurgery;  Laterality: N/A;  KYPHOPLASTY THORACIC SIX- THORACIC SEVEN  . MASTECTOMY    . MITRAL VALVE REPLACEMENT     25 mm [employer] Jude MVR, 05/2006, Dr. Lajuana Matte, [facility]  . pericardial stripping  9/08  . POLYPECTOMY  02/22/2019   Procedure: POLYPECTOMY;  Surgeon: Mansouraty, Telford Nab., MD;  Location: [place];  Service: Gastroenterology;;  . SPLENECTOMY     ~ 1974  . SPLENECTOMY, TOTAL    . SUBMUCOSAL TATTOO INJECTION  02/22/2019   Procedure: SUBMUCOSAL TATTOO INJECTION;  Surgeon: Irving Copas., MD;  Location: [place];  Service: Gastroenterology;;  . THORACOTOMY     for pericardial stripping 01/2007 (Dr. Elenor Quinones, [facility])  . TONSILLECTOMY    . valvulopathy  06/13/06   Social History   Socioeconomic History  . Marital status: Married    Spouse name: Not on file  . Number of children: 0  . Years of education:  Not on file  . Highest education level: Not on file  Occupational History  . Occupation: retired  Tobacco Use  . Smoking status: Never Smoker  . Smokeless tobacco: Never Used  Substance and Sexual Activity  . Alcohol use: No  . Drug use: No  . Sexual activity: Not on file  Other Topics Concern  . Not on file  Social History Narrative   Lived in [place] for 18 years. Moved here for the weather.    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      Husband retired January 2015. Married 1991 (2nd marriage). No kids.       Hobbies: walks 2 miles per day, read, work outside         [employer] of [employer] broadcast assistant Strain:   . Difficulty of Paying Living Expenses:   Food Insecurity:   . Worried About Charity fundraiser in the Last Year:   . Arboriculturist in the Last Year:   Transportation Needs:   . Film/video editor (Medical):   Marland Kitchen Lack of Transportation (Non-Medical):   Physical Activity:   . Days of Exercise per Week:   . Minutes of Exercise per Session:   Stress:   . Feeling of Stress :   Social Connections:   . Frequency of Communication with Friends and Family:   . Frequency of Social Gatherings with Friends and Family:   . Attends Religious Services:   . Active Member of Clubs or Organizations:   . Attends Archivist Meetings:   Marland Kitchen Marital Status:   Intimate Partner Violence:   . Fear of Current or Ex-Partner:   . Emotionally Abused:   Marland Kitchen Physically Abused:   . Sexually Abused:    Family History  Problem Relation Age of Onset  . Heart disease Mother   . Cancer Mother        breast  . Other Father        hunting gun accident  . Heart disease Maternal Aunt   . Cancer Maternal Aunt        uterine  . Heart disease Maternal Grandmother   . Diabetes Maternal Grandmother   . Hypercalcemia Neg Hx   . Colon cancer Neg Hx   . Esophageal cancer Neg Hx   .  Liver disease  Neg Hx   . Pancreatic cancer Neg Hx   . Rectal cancer Neg Hx   . Stomach cancer Neg Hx    I have reviewed her medical, social, and family history in detail and updated the electronic medical record as necessary.    PHYSICAL EXAMINATION  BP 120/70   Pulse 83   Temp 98.3 F (36.8 C)   Ht 5' 0.5" (1.537 m)   Wt 126 lb (57.2 kg)   BMI 24.20 kg/m  GEN: NAD, appears stated age, doesn't appear chronically ill PSYCH: Cooperative, without pressured speech EYE: Conjunctivae pink, sclerae anicteric ENT: MMM CV: Nontachycardic, click with systolic murmur present RESP: No audible wheezing present GI: NABS, soft, NT/ND, without rebound or guarding MSK/EXT: No significant lower extremity edema SKIN: No jaundice NEURO:  Alert & Oriented x 3, no focal deficits   REVIEW OF DATA  I reviewed the following data at the time of this encounter:  GI Procedures and Studies  September 2020 push enteroscopy - No gross lesions in proximal esophagus. LA Grade B esophagitis distally. Nonobstructing Schatzki ring. 3 cm hiatal hernia. - Erythematous mucosa in the gastric body. Biopsied for HP. - Normal mucosa was found in the entire examined duodenum. - Normal mucosa was found in the jejunum. Biopsied for Celiac/Enteropathy. Tattooed distal extent.  September 2020 colonoscopy - Hemorrhoids found on digital rectal exam. - Four 4 to 10 mm polyps in the ascending colon and in the cecum, removed with a cold snare. Resected and retrieved. Clips (MR conditional) were placed on 2 of the resection sites - Diverticulosis in the recto-sigmoid colon, in the sigmoid colon and in the ascending colon. - Normal mucosa in the entire examined colon. - Non-bleeding non-thrombosed external and internal hemorrhoids.  Pathology DIAGNOSIS:  A. SMALL INTESTINE, BIOPSY:  - Benign small bowel type mucosa.  - There is no evidence of significant villous atrophy, dysplasia, or  malignancy.  B. STOMACH, RANDOM, BIOPSY:    - Chronic inactive gastritis.  - There is no evidence of helicobacter pylori, dysplasia, or malignancy.  - See comment.  C. COLON, CECAL AND ASCENDING, POLYPECTOMY:  - Tubular adenomas.  - High-grade dysplasia is not identified.  Laboratory Studies  Reviewed in epic including most recent iron indices and CBC  Imaging Studies  No relevant studies to review   ASSESSMENT  Ms. Riles is a 69 y.o. female with a pmh significant for CHFrEF, Breast cancer survivor, AVR/MVR (on Coumadin 2.5-3.5), prior Hodgkin's Lymphoma (s/p Radiation & Chemo), Hypothyroidism, HLD, Iron deficiency anemia (s/p PO and IV iron supplementation), vertebral fracture (s/p Kyphoplasty), hiatal hernia, Schatzki ring, GERD with esophagitis, diverticulosis, hemorrhoids, adenomatous colon polyps.  The patient is seen today for evaluation and management of:  1. Other iron deficiency anemia   2. Schatzki's ring   3. Hiatal hernia   4. History of colonic polyps   5. Esophagitis determined by endoscopy   6. Diverticulosis of colon without hemorrhage   7. Other hemorrhoids    The patient is hemodynamically and clinically stable.  From an iron deficiency perspective she is doing well and has had normalization of her hemoglobin as well as her iron indices as of late February 2020.  She is planning a follow-up with her primary care doctor in the coming months.  At that time we will plan a follow-up CBC/iron/TIBC/ferritin to be performed.  If those are stable then would recommend seeing her back in approximately 6 months with repeat iron indices.  If the  patient manifests recurrent iron deficiency and/or iron deficiency with anemia then the patient will need repeat IV iron infusions and need to proceed with a video capsule endoscopy.  Hopefully we will not need to do this, as long as she maintains her iron indices.  We will continue her on oral iron twice daily as she is tolerating it.  She will also continue her on omeprazole 40 mg  daily.  Eventually we will try to decrease her dose down further but we will monitor and see how she does.  All patient questions were answered, to the best of my ability, and the patient agrees to the aforementioned plan of action with follow-up as indicated.   PLAN  Continue twice daily oral iron supplementation CBC/iron/TIBC/ferritin to be performed in the next few weeks at time of PCP visit Follow-up in 6 months approximately if stable hemoglobin No video capsule endoscopy for now unless patient develops recurrent iron deficiency and/your iron deficiency with anemia  Continue omeprazole 40 mg daily If patient develops dysphagia symptoms will need to proceed with dilation of Schatzki ring   Orders Placed This Encounter  Procedures  . CBC  . IBC panel  . Ferritin    New Prescriptions   No medications on file   Modified Medications   Modified Medication Previous Medication   OMEPRAZOLE (PRILOSEC) 40 MG CAPSULE omeprazole (PRILOSEC) 40 MG capsule      Take 1 capsule (40 mg total) by mouth daily. Take 30 minutes before breakfast or dinner.    Take 1 capsule (40 mg total) by mouth daily. Take 30 minutes before breakfast or dinner.    Planned Follow Up: Return in about 6 months (around 03/17/2020).   Total Time in Face-to-Face and in Coordination of Care for patient including independent/personal interpretation/review of prior testing, medical history, examination, medication adjustment, communicating results with the patient directly, and documentation with the EHR is 25 minutes.  Justice Britain, MD [facility] Office # [id number]

## 2019-09-20 ENCOUNTER — Other Ambulatory Visit: Payer: Self-pay

## 2019-09-20 ENCOUNTER — Ambulatory Visit: Payer: Medicare PPO

## 2019-09-20 DIAGNOSIS — Z0000 Encounter for general adult medical examination without abnormal findings: Secondary | ICD-10-CM | POA: Diagnosis not present

## 2019-09-20 NOTE — Progress Notes (Signed)
This visit is being conducted via phone call due to the COVID-19 pandemic. This patient has given me verbal consent via phone to conduct this visit, patient states they are participating from their home address. Some vital signs may be absent or patient reported.   Patient identification: identified by name, DOB, and current address.  Location provider: [facility], Office Persons participating in the virtual visit: Denman George LPN, patient, and Dr. Garret Reddish   Subjective:   Grace [name] is a 69 y.o. female who presents for Medicare Annual (Subsequent) preventive examination.  Review of Systems:   Cardiac Risk Factors include: advanced age (>64men, >77 women);dyslipidemia;hypertension    Objective:     Vitals: There were no vitals taken for this visit.  There is no height or weight on file to calculate BMI.  Advanced Directives 09/20/2019 02/22/2019 12/07/2018  Does Patient Have a Medical Advance Directive? Yes Yes Yes  Type of Advance Directive Living will;Healthcare Power of [place];Living will [place];Living will  Does patient want to make changes to medical advance directive? No - Patient declined - No - Patient declined  Copy of [place] in Chart? Yes - validated most recent copy scanned in chart (See row information) Yes - validated most recent copy scanned in chart (See row information) Yes - validated most recent copy scanned in chart (See row information)    Tobacco Social History   Tobacco Use  Smoking Status Never Smoker  Smokeless Tobacco Never Used     Counseling given: Not Answered   Clinical Intake:  Pre-visit preparation completed: Yes  Pain : No/denies pain  Diabetes: No  How often do you need to have someone help you when you read instructions, pamphlets, or other written materials from your doctor or pharmacy?: 1 - Never  Interpreter Needed?: No  Information entered by  :: Denman George LPN  Past Medical History:  Diagnosis Date   Anemia    Aortic insufficiency    AORTIC VALVE REPLACEMENT, HX OF 04/27/2007   Qualifier: Diagnosis of  By: Leanne Chang MD, Bruce     Cancer [facility])    left breast- surgery and chemo   CHF (congestive heart failure) ([place])    Chronic kidney disease    Congestive heart failure ([place]) 08/03/2009   [facility] Dr. Paul Half. Last echo 2010 with EF 30%. Restrictive due to radiation. AVR MVR. 02/2014 visit planned.     Coronary artery disease    11/06/06 [facility]): 40% oLM, 60% dLAD, 30% oRCA   Dysrhythmia    paroxsymal a-flutter 2018; intermittent left BBB (2016)   Edema of both feet    GI bleed    Heart murmur    mitral and aortic valve replacement   Hodgkin's disease    ~1974, s/p [place] radiation and chemo   Hyperlipidemia    Hypothyroidism    Migraine    none since 2008   MITRAL VALVE REPLACEMENT, HX OF 04/27/2007   Qualifier: Diagnosis of  By: Leanne Chang MD, Bruce     Pericarditis    s/p pericardidal stripping through lateral thoracotomy 01/2007 [facility])   Stroke ([place]) 2008   No residual effects   TIA (transient ischemic attack)    Tricuspid regurgitation    Varicose veins of both lower extremities    left worse than right- have gotten larger in April 2020   WPW (Wolff-Parkinson-White syndrome)    Past Surgical History:  Procedure Laterality Date   AORTIC VALVE  SURGERY     19 mm mechanical [facility] AVR 05/2006 [facility], Dr. Lajuana Matte)   bilateral breast implants     BIOPSY  02/22/2019   Procedure: BIOPSY;  Surgeon: Irving Copas., MD;  Location: [place];  Service: Gastroenterology;;   COLONOSCOPY WITH PROPOFOL N/A 02/22/2019   Procedure: COLONOSCOPY WITH PROPOFOL;  Surgeon: Irving Copas., MD;  Location: [place];  Service: Gastroenterology;  Laterality: N/A;   ENDOMETRIAL ABLATION     ENTEROSCOPY N/A 02/22/2019   Procedure: ENTEROSCOPY;  Surgeon: Rush Landmark Telford Nab., MD;   Location: [place];  Service: Gastroenterology;  Laterality: N/A;   EXPLORATORY LAPAROTOMY     HEMOSTASIS CLIP PLACEMENT  02/22/2019   Procedure: HEMOSTASIS CLIP PLACEMENT;  Surgeon: Irving Copas., MD;  Location: [facility] ENDOSCOPY;  Service: Gastroenterology;;   KYPHOPLASTY N/A 12/10/2018   Procedure: KYPHOPLASTY THORACIC SIX- THORACIC SEVEN;  Surgeon: Newman Pies, MD;  Location: [place];  Service: Neurosurgery;  Laterality: N/A;  KYPHOPLASTY THORACIC SIX- THORACIC SEVEN   MASTECTOMY     MITRAL VALVE REPLACEMENT     25 mm [employer] [facility] MVR, 05/2006, Dr. Lajuana Matte, [place]   pericardial stripping  9/08   POLYPECTOMY  02/22/2019   Procedure: POLYPECTOMY;  Surgeon: Mansouraty, Telford Nab., MD;  Location: [place];  Service: Gastroenterology;;   SPLENECTOMY     ~ [place] INJECTION  02/22/2019   Procedure: SUBMUCOSAL TATTOO INJECTION;  Surgeon: Irving Copas., MD;  Location: [place];  Service: Gastroenterology;;   THORACOTOMY     for pericardial stripping 01/2007 (Dr. Elenor Quinones, [facility])   [place]     valvulopathy  06/13/06   Family History  Problem Relation Age of Onset   Heart disease Mother    Cancer Mother        breast   Other Father        hunting gun accident   Heart disease Maternal Aunt    Cancer Maternal Aunt        uterine   Heart disease Maternal Grandmother    Diabetes Maternal Grandmother    Hypercalcemia Neg Hx    Colon cancer Neg Hx    Esophageal cancer Neg Hx    Liver disease Neg Hx    Pancreatic cancer Neg Hx    Rectal cancer Neg Hx    Stomach cancer Neg Hx    Social History   Socioeconomic History   Marital status: Married    Spouse name: Not on file   Number of children: 0   Years of education: Not on file   Highest education level: Not on file  Occupational History   Occupation: retired  Tobacco Use   Smoking status: Never Smoker   Smokeless tobacco:  Never Used  Substance and Sexual Activity   Alcohol use: No   Drug use: No   Sexual activity: Not on file  Other Topics Concern   Not on file  Social History Narrative   Lived in [place] for 18 years. Moved here for the weather.    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      Husband retired January 2015. Married 1991 (2nd marriage). No kids.       Hobbies: walks 2 miles per day, read, work outside         Scientist, physiological Strain:    Difficulty of Paying Living Expenses:  Food Insecurity:    Worried About Charity fundraiser in the Last Year:    Arboriculturist in the Last Year:   Transportation Needs:    Film/video editor (Medical):    Lack of Transportation (Non-Medical):   Physical Activity:    Days of Exercise per Week:    Minutes of Exercise per Session:   Stress:    Feeling of Stress :   Social Connections:    Frequency of Communication with Friends and Family:    Frequency of Social Gatherings with Friends and Family:    Attends Religious Services:    Active Member of Clubs or Organizations:    Attends Archivist Meetings:    Marital Status:     Outpatient Encounter Medications as of 09/20/2019  Medication Sig   amoxicillin (AMOXIL) 500 MG capsule Take 500 mg by mouth as directed. 4 tablets prior to dental procedures   aspirin 81 MG tablet Take 81 mg by mouth daily.     aspirin-acetaminophen-caffeine (EXCEDRIN MIGRAINE) 250-250-65 MG tablet Take 2 tablets by mouth every 6 (six) hours as needed for headache.   cholecalciferol (VITAMIN D3) 25 MCG (1000 UT) tablet Take 1,000 Units by mouth daily.   denosumab (PROLIA) 60 MG/ML SOSY injection Inject 60 mg into the skin every 6 (six) months.    diphenhydramine-acetaminophen (TYLENOL PM) 25-500 MG TABS tablet Take 1 tablet by mouth at bedtime as needed (sleep).    docusate sodium  (COLACE) 100 MG capsule Take 100 mg by mouth daily.    ezetimibe (ZETIA) 10 MG tablet Take 10 mg by mouth daily.    ferrous sulfate 325 (65 FE) MG tablet Take 325 mg by mouth 2 (two) times a day.    furosemide (LASIX) 40 MG tablet TAKE 1 TABLET EVERY DAY   levothyroxine (SYNTHROID) 100 MCG tablet Take 1 tablet (100 mcg total) by mouth daily.   Magnesium 250 MG TABS Take 250 mg by mouth daily.    metoprolol (TOPROL-XL) 50 MG 24 hr tablet Take 50 mg by mouth 2 (two) times daily.    omeprazole (PRILOSEC) 40 MG capsule Take 1 capsule (40 mg total) by mouth daily. Take 30 minutes before breakfast or dinner.   potassium chloride SA (KLOR-CON) 20 MEQ tablet Take 1 tablet (20 mEq total) by mouth daily.   REPATHA SURECLICK [id number] MG/ML SOAJ Inject 140 mg into the skin every 14 (fourteen) days.   spironolactone (ALDACTONE) 25 MG tablet Take 25 mg by mouth 2 (two) times daily.    warfarin (COUMADIN) 5 MG tablet TAKE 1 TABLET EVERY DAY EXCEPT TAKE 1/2 TABLET ON FRIDAYS AS DIRECTED   No facility-administered encounter medications on file as of 09/20/2019.    Activities of Daily Living In your present state of health, do you have any difficulty performing the following activities: 09/20/2019 12/07/2018  Hearing? N N  Vision? N N  Difficulty concentrating or making decisions? N N  Walking or climbing stairs? N Y  Dressing or bathing? N N  Doing errands, shopping? N N  Preparing Food and eating ? N -  Using the Toilet? N -  In the past six months, have you accidently leaked urine? N -  Do you have problems with loss of bowel control? N -  Managing your Medications? N -  Managing your Finances? N -  Housekeeping or managing your Housekeeping? N -  Some recent data might be hidden    Patient Care Team: Marin Olp,  MD as PCP - General (Family Medicine) Rosemarie Ax, MD as Consulting Physician (Family Medicine) Obgyn, Wilber Oliphant, MD as Consulting Physician  (Dermatology) [place], [place], [place] as Consulting Physician (Optometry) Alycia Rossetti, Laqueta Due, MD as Consulting Physician (Cardiology) Mansouraty, Telford Nab., MD as Consulting Physician (Gastroenterology) Philemon Kingdom, MD as Consulting Physician (Internal Medicine)    Assessment:   This is a routine wellness examination for [place].  Exercise Activities and Dietary recommendations Current Exercise Habits: The patient does not participate in regular exercise at present, Exercise limited by: cardiac condition(s);orthopedic condition(s)  Goals   None     Fall Risk Fall Risk  09/20/2019 10/08/2018 04/28/2018 12/20/2016 10/19/2015  Falls in the past year? 0 0 0 No No  Number falls in past yr: 0 - - - -  Injury with Fall? 0 - - - -  Risk for fall due to : Orthopedic patient - - - -  Follow up Falls prevention discussed;Education provided;Falls evaluation completed - - - -   Is the patient's home free of loose throw rugs in walkways, pet beds, electrical cords, etc?   yes      Grab bars in the bathroom? yes      Handrails on the stairs?   yes      Adequate lighting?   yes   Depression Screen PHQ 2/9 Scores 09/20/2019 10/08/2018 04/28/2018 12/20/2016  PHQ - 2 Score 0 0 0 0     Cognitive Function- no cognitive concerns at this time    6CIT Screen 09/20/2019  What Year? 0 points  What month? 0 points  What time? 0 points  Count back from 20 0 points  Months in reverse 0 points  Repeat phrase 0 points  Total Score 0    Immunization History  Administered Date(s) Administered   Fluad Quad(high Dose 65+) 02/26/2019   Influenza Split 03/21/2011, 03/24/2012   Influenza Whole 03/16/2008, 04/27/2009, 03/01/2010   Influenza, High Dose Seasonal PF 02/28/2016, 03/14/2017, 02/27/2018   Influenza,inj,Quad PF,6+ Mos 03/12/2013, 03/15/2014, 03/14/2015   Influenza-Unspecified 02/24/2014, 03/14/2015, 02/26/2019   Moderna SARS-COVID-2 Vaccination 07/10/2019, 08/07/2019   Pneumococcal  Conjugate-13 03/26/2013   Pneumococcal Polysaccharide-23 02/25/2003, 10/19/2015   Td 06/24/2002, 03/26/2013   Tdap 03/26/2013   Zoster 04/24/2011    Qualifies for Shingles Vaccine?Discussed and patient will check with pharmacy for coverage.  Patient education handout provided   Screening Tests Health Maintenance  Topic Date Due   INFLUENZA VACCINE  12/26/2019   MAMMOGRAM  03/21/2021   TETANUS/TDAP  03/27/2023   COLONOSCOPY  02/21/2029   DEXA SCAN  Completed   COVID-19 Vaccine  Completed   Hepatitis C Screening  Completed   PNA vac Low Risk Adult  Completed    Cancer Screenings: Lung: Low Dose CT Chest recommended if Age 33-80 years, 30 pack-year currently smoking OR have quit w/in 15years. Patient does not qualify. Breast:  Up to date on Mammogram? Yes   Up to date of Bone Density/Dexa? Yes Colorectal: colonoscopy 02/22/19   Plan:  I have personally reviewed and addressed the Medicare Annual Wellness questionnaire and have noted the following in the patients chart:  A. Medical and social history B. Use of alcohol, tobacco or illicit drugs  C. Current medications and supplements D. Functional ability and status E.  Nutritional status F.  Physical activity G. Advance directives H. List of other physicians I.  Hospitalizations, surgeries, and ER visits in previous 12 months J.  Door such as hearing and vision if  needed, cognitive and depression L. Referrals, records requested, and appointments- none   In addition, I have reviewed and discussed with patient certain preventive protocols, quality metrics, and best practice recommendations. A written personalized care plan for preventive services as well as general preventive health recommendations were provided to patient.   Signed,  Denman George, LPN  Nurse Health Advisor   Nurse Notes: no additional

## 2019-09-20 NOTE — Patient Instructions (Signed)
Ms. Grace [name] , Thank you for taking time to come for your Medicare Wellness Visit. I appreciate your ongoing commitment to your health goals. Please review the following plan we discussed and let me know if I can assist you in the future.   Screening recommendations/referrals: Colorectal Screening: up to date; colonoscopy 02/22/19 Mammogram: up to date; last 03/22/19 Bone Density: up to date; last 03/22/19  Vision and Dental Exams: Recommended annual ophthalmology exams for early detection of glaucoma and other disorders of the eye Recommended annual dental exams for proper oral hygiene  Vaccinations: Influenza vaccine: completed 02/26/19 Pneumococcal vaccine: up to date; last 10/19/15 Tdap vaccine: up to date; last 03/26/13  Shingles vaccine: You may receive this vaccine at your local pharmacy. (see handout)  Covid vaccine:  Completed   Advanced directives: We have received a copy of your POA (Power of [place]) and/or Living Will. These documents can be located in your chart.  Goals: Recommend to drink at least 6-8 8oz glasses of water per day and consume a balanced diet rich in fresh fruits and vegetables.   Next appointment: Please schedule your Annual Wellness Visit with your Nurse Health Advisor in one year.  Preventive Care 31 Years and Older, Female Preventive care refers to lifestyle choices and visits with your health care provider that can promote health and wellness. What does preventive care include?  A yearly physical exam. This is also called an annual well check.  Dental exams once or twice a year.  Routine eye exams. Ask your health care provider how often you should have your eyes checked.  Personal lifestyle choices, including:  Daily care of your teeth and gums.  Regular physical activity.  Eating a healthy diet.  Avoiding tobacco and drug use.  Limiting alcohol use.  Practicing safe sex.  Taking low-dose aspirin every day if recommended by your health  care provider.  Taking vitamin and mineral supplements as recommended by your health care provider. What happens during an annual well check? The services and screenings done by your health care provider during your annual well check will depend on your age, overall health, lifestyle risk factors, and family history of disease. Counseling  Your health care provider may ask you questions about your:  Alcohol use.  Tobacco use.  Drug use.  Emotional well-being.  Home and relationship well-being.  Sexual activity.  Eating habits.  History of falls.  Memory and ability to understand (cognition).  Work and work Statistician.  Reproductive health. Screening  You may have the following tests or measurements:  Height, weight, and BMI.  Blood pressure.  Lipid and cholesterol levels. These may be checked every 5 years, or more frequently if you are over 42 years old.  Skin check.  Lung cancer screening. You may have this screening every year starting at age 77 if you have a 30-pack-year history of smoking and currently smoke or have quit within the past 15 years.  Fecal occult blood test (FOBT) of the stool. You may have this test every year starting at age 71.  Flexible sigmoidoscopy or colonoscopy. You may have a sigmoidoscopy every 5 years or a colonoscopy every 10 years starting at age 14.  Hepatitis C blood test.  Hepatitis B blood test.  Sexually transmitted disease (STD) testing.  Diabetes screening. This is done by checking your blood sugar (glucose) after you have not eaten for a while (fasting). You may have this done every 1-3 years.  Bone density scan. This is done to  screen for osteoporosis. You may have this done starting at age 22.  Mammogram. This may be done every 1-2 years. Talk to your health care provider about how often you should have regular mammograms. Talk with your health care provider about your test results, treatment options, and if  necessary, the need for more tests. Vaccines  Your health care provider may recommend certain vaccines, such as:  Influenza vaccine. This is recommended every year.  Tetanus, diphtheria, and acellular pertussis (Tdap, Td) vaccine. You may need a Td booster every 10 years.  Zoster vaccine. You may need this after age 2.  Pneumococcal 13-valent conjugate (PCV13) vaccine. One dose is recommended after age 31.  Pneumococcal polysaccharide (PPSV23) vaccine. One dose is recommended after age 51. Talk to your health care provider about which screenings and vaccines you need and how often you need them. This information is not intended to replace advice given to you by your health care provider. Make sure you discuss any questions you have with your health care provider. Document Released: 06/09/2015 Document Revised: 01/31/2016 Document Reviewed: 03/14/2015 Elsevier Interactive Patient Education  2017 [place] Prevention in the Home Falls can cause injuries. They can happen to people of all ages. There are many things you can do to make your home safe and to help prevent falls. What can I do on the outside of my home?  Regularly fix the edges of walkways and driveways and fix any cracks.  Remove anything that might make you trip as you walk through a door, such as a raised step or threshold.  Trim any bushes or trees on the path to your home.  Use bright outdoor lighting.  Clear any walking paths of anything that might make someone trip, such as rocks or tools.  Regularly check to see if handrails are loose or broken. Make sure that both sides of any steps have handrails.  Any raised decks and porches should have guardrails on the edges.  Have any leaves, snow, or ice cleared regularly.  Use sand or salt on walking paths during winter.  Clean up any spills in your garage right away. This includes oil or grease spills. What can I do in the bathroom?  Use night  lights.  Install grab bars by the toilet and in the tub and shower. Do not use towel bars as grab bars.  Use non-skid mats or decals in the tub or shower.  If you need to sit down in the shower, use a plastic, non-slip stool.  Keep the floor dry. Clean up any water that spills on the floor as soon as it happens.  Remove soap buildup in the tub or shower regularly.  Attach bath mats securely with double-sided non-slip rug tape.  Do not have throw rugs and other things on the floor that can make you trip. What can I do in the bedroom?  Use night lights.  Make sure that you have a light by your bed that is easy to reach.  Do not use any sheets or blankets that are too big for your bed. They should not hang down onto the floor.  Have a firm chair that has side arms. You can use this for support while you get dressed.  Do not have throw rugs and other things on the floor that can make you trip. What can I do in the kitchen?  Clean up any spills right away.  Avoid walking on wet floors.  Keep items that  you use a lot in easy-to-reach places.  If you need to reach something above you, use a strong step stool that has a grab bar.  Keep electrical cords out of the way.  Do not use floor polish or wax that makes floors slippery. If you must use wax, use non-skid floor wax.  Do not have throw rugs and other things on the floor that can make you trip. What can I do with my stairs?  Do not leave any items on the stairs.  Make sure that there are handrails on both sides of the stairs and use them. Fix handrails that are broken or loose. Make sure that handrails are as long as the stairways.  Check any carpeting to make sure that it is firmly attached to the stairs. Fix any carpet that is loose or worn.  Avoid having throw rugs at the top or bottom of the stairs. If you do have throw rugs, attach them to the floor with carpet tape.  Make sure that you have a light switch at the  top of the stairs and the bottom of the stairs. If you do not have them, ask someone to add them for you. What else can I do to help prevent falls?  Wear shoes that:  Do not have high heels.  Have rubber bottoms.  Are comfortable and fit you well.  Are closed at the toe. Do not wear sandals.  If you use a stepladder:  Make sure that it is fully opened. Do not climb a closed stepladder.  Make sure that both sides of the stepladder are locked into place.  Ask someone to hold it for you, if possible.  Clearly mark and make sure that you can see:  Any grab bars or handrails.  First and last steps.  Where the edge of each step is.  Use tools that help you move around (mobility aids) if they are needed. These include:  Canes.  Walkers.  Scooters.  Crutches.  Turn on the lights when you go into a dark area. Replace any light bulbs as soon as they burn out.  Set up your furniture so you have a clear path. Avoid moving your furniture around.  If any of your floors are uneven, fix them.  If there are any pets around you, be aware of where they are.  Review your medicines with your doctor. Some medicines can make you feel dizzy. This can increase your chance of falling. Ask your doctor what other things that you can do to help prevent falls. This information is not intended to replace advice given to you by your health care provider. Make sure you discuss any questions you have with your health care provider. Document Released: 03/09/2009 Document Revised: 10/19/2015 Document Reviewed: 06/17/2014 Elsevier Interactive Patient Education  2017 [employer].

## 2019-09-22 ENCOUNTER — Other Ambulatory Visit: Payer: Self-pay

## 2019-09-22 MED ORDER — OMEPRAZOLE 20 MG PO CPDR: 20.0000 mg | DELAYED_RELEASE_CAPSULE | Freq: Every day | ORAL | 3 refills | Status: DC

## 2019-09-28 ENCOUNTER — Telehealth: Payer: Self-pay | Admitting: Internal Medicine

## 2019-09-28 ENCOUNTER — Other Ambulatory Visit: Payer: Self-pay

## 2019-09-28 ENCOUNTER — Ambulatory Visit: Payer: Medicare PPO | Admitting: General Practice

## 2019-09-28 DIAGNOSIS — Z952 Presence of prosthetic heart valve: Secondary | ICD-10-CM | POA: Diagnosis not present

## 2019-09-28 DIAGNOSIS — Z7901 Long term (current) use of anticoagulants: Secondary | ICD-10-CM

## 2019-09-28 LAB — POCT INR: INR: 2.6 (ref 2.0–3.0)

## 2019-09-28 NOTE — Progress Notes (Signed)
Medical screening examination/treatment/procedure(s) were performed by non-physician practitioner and as supervising physician I was immediately available for consultation/collaboration. I agree with above. Kamren Heintzelman, MD   

## 2019-09-28 NOTE — Telephone Encounter (Signed)
Patient requests a call back regarding prolia. [phone]

## 2019-09-28 NOTE — Patient Instructions (Addendum)
Pre visit review using our clinic review tool, if applicable. No additional management support is needed unless otherwise documented below in the visit note. Continue to take 1 tablet daily except 1/2 tablet on Mon Wed and Fridays and Saturdays.  Re-check in 4 week.

## 2019-09-29 ENCOUNTER — Telehealth: Payer: Self-pay

## 2019-09-29 NOTE — Telephone Encounter (Signed)
PA for Prolia 60mg  injection initiated via [email or web address] for patient.   [facility] Key: [id number]   PA Case ID: [id number] Status: Sent to [place] Drug: Prolia 60MG /ML syringes Form: Nurse, adult and Medical Benefit PA Form

## 2019-09-29 NOTE — Telephone Encounter (Signed)
Received notification from pt's insurance stating that the pt is approved for Prolia.  [facility]  Key: [id number]   PA Case ID: [id number] Outcome: Approvedtoday  Status: Approved Coverage Starts on: 09/29/2019 12:00:00 AM  Coverage Ends on: 05/26/2020 12:00:00 AM.  Drug:Prolia 60MG /ML syringes Form: Nurse, adult and Medical Benefit PA Form MyChart message sent to pt to schedule anytime after 09/29/19.  According to summary of benefits, patient is responsible for $35 administration fee, but medication is covered at 100%.

## 2019-09-29 NOTE — Telephone Encounter (Signed)
Called pt and left voicemail requesting a call back. °

## 2019-10-01 ENCOUNTER — Ambulatory Visit: Payer: Medicare PPO

## 2019-10-01 ENCOUNTER — Other Ambulatory Visit: Payer: Self-pay

## 2019-10-01 DIAGNOSIS — M810 Age-related osteoporosis without current pathological fracture: Secondary | ICD-10-CM | POA: Diagnosis not present

## 2019-10-01 MED ORDER — DENOSUMAB 60 MG/ML ~~LOC~~ SOSY
60.0000 mg | PREFILLED_SYRINGE | Freq: Once | SUBCUTANEOUS | Status: AC
  Administered 2019-10-01: 11:00:00 60 mg via SUBCUTANEOUS

## 2019-10-01 NOTE — Progress Notes (Signed)
Per orders of Dr.Gherghe injection of Prolia 60mg given today by N.Ndidi Nesby,LPN. Patient tolerated injection well.  

## 2019-10-07 ENCOUNTER — Ambulatory Visit: Payer: Medicare PPO

## 2019-10-08 ENCOUNTER — Ambulatory Visit: Payer: Medicare PPO

## 2019-10-13 NOTE — Progress Notes (Signed)
Phone [phone] In person visit   Subjective:   Grace [name] is a 69 y.o. year old very pleasant female patient who presents for/with See problem oriented charting Chief Complaint  Patient presents with  . Hyperlipidemia  . Hypertension    This visit occurred during the SARS-CoV-2 public health emergency.  Safety protocols were in place, including screening questions prior to the visit, additional usage of staff PPE, and extensive cleaning of exam room while observing appropriate contact time as indicated for disinfecting solutions.   Past Medical History-  Patient Active Problem List   Diagnosis Date Noted  . Atrial flutter ([place]) 12/20/2016    Priority: High  . Osteoporosis 01/17/2014    Priority: High  . H/O aortic valve replacement and mitral valve replacement. 01/07/2013    Priority: High  . WOLFF (WOLFE)-PARKINSON-WHITE (WPW) SYNDROME 08/16/2009    Priority: High  . Congestive heart failure ([place]) 08/03/2009    Priority: High  . TRANSIENT ISCHEMIC ATTACKS, HX OF 04/27/2007    Priority: High  . History of Hodgkin's disease 11/03/2006    Priority: High  . History of colonic polyps 02/26/2019    Priority: Medium  . Hypertension 12/20/2016    Priority: Medium  . CKD (chronic kidney disease), stage III 01/18/2014    Priority: Medium  . Iron deficiency anemia 08/16/2009    Priority: Medium  . Hyperlipidemia 08/29/2008    Priority: Medium  . Hypothyroidism 11/03/2006    Priority: Medium  . COMMON MIGRAINE 11/03/2006    Priority: Medium  . BREAST CANCER, HX OF 11/03/2006    Priority: Medium  . Long term (current) use of anticoagulants 12/23/2012    Priority: Low  . GASTROINTESTINAL HEMORRHAGE, HX OF 11/03/2006    Priority: Low  . Other hemorrhoids 09/18/2019  . Diverticulosis of colon without hemorrhage 09/18/2019  . Esophagitis determined by endoscopy 09/18/2019  . Schatzki's ring 09/18/2019  . Hiatal hernia 09/18/2019  . Acute left-sided low back pain  without sciatica 01/08/2019  . Thoracic compression fracture, with delayed healing, subsequent encounter 12/10/2018  . Compression fracture of thoracic spine, non-traumatic, initial encounter ([place]) 11/09/2018  . Acute midline thoracic back pain 11/03/2018  . Sacroiliac pain 10/12/2018  . Baker cyst, left 10/12/2018  . Microcytic anemia 09/17/2018  . Hx of long-term (current) use of anticoagulants 07/22/2018  . Hypercalcemia 12/23/2017    Medications- reviewed and updated Current Outpatient Medications  Medication Sig Dispense Refill  . amoxicillin (AMOXIL) 500 MG capsule Take 500 mg by mouth as directed. 4 tablets prior to dental procedures    . aspirin 81 MG tablet Take 81 mg by mouth daily.      Marland Kitchen aspirin-acetaminophen-caffeine (EXCEDRIN MIGRAINE) 250-250-65 MG tablet Take 2 tablets by mouth every 6 (six) hours as needed for headache.    . cholecalciferol (VITAMIN D3) 25 MCG (1000 UT) tablet Take 1,000 Units by mouth daily.    Marland Kitchen denosumab (PROLIA) 60 MG/ML SOSY injection Inject 60 mg into the skin every 6 (six) months.     . diphenhydramine-acetaminophen (TYLENOL PM) 25-500 MG TABS tablet Take 1 tablet by mouth at bedtime as needed (sleep).     . docusate sodium (COLACE) 100 MG capsule Take 100 mg by mouth daily.     Marland Kitchen ezetimibe (ZETIA) 10 MG tablet Take 10 mg by mouth daily.     . ferrous sulfate 325 (65 FE) MG tablet Take 325 mg by mouth 2 (two) times a day.     . furosemide (LASIX) 40 MG  tablet TAKE 1 TABLET EVERY DAY 90 tablet 1  . levothyroxine (SYNTHROID) 100 MCG tablet Take 1 tablet (100 mcg total) by mouth daily. 90 tablet 3  . Magnesium 250 MG TABS Take 250 mg by mouth daily.     . metoprolol (TOPROL-XL) 50 MG 24 hr tablet Take 50 mg by mouth 2 (two) times daily.     Marland Kitchen omeprazole (PRILOSEC) 20 MG capsule Take 1 capsule (20 mg total) by mouth daily. Take 30 minutes before breakfast or dinner. 90 capsule 3  . potassium chloride SA (KLOR-CON) 20 MEQ tablet Take 1 tablet (20 mEq  total) by mouth daily. 90 tablet 3  . REPATHA SURECLICK [id number] MG/ML SOAJ Inject 140 mg into the skin every 14 (fourteen) days.    Marland Kitchen spironolactone (ALDACTONE) 25 MG tablet Take 25 mg by mouth 2 (two) times daily.     Marland Kitchen warfarin (COUMADIN) 5 MG tablet TAKE 1 TABLET EVERY DAY EXCEPT TAKE 1/2 TABLET ON FRIDAYS AS DIRECTED 100 tablet 1   No current facility-administered medications for this visit.     Objective:  BP 124/68   Pulse 86   Temp 97.6 F (36.4 C) (Temporal)   Ht 5' 0.5" (1.537 m)   Wt 128 lb (58.1 kg)   SpO2 98%   BMI 24.59 kg/m  Gen: NAD, resting comfortably CV: RRR harsh mechanical murmur Lungs: CTAB no crackles, wheeze, rhonchi Ext: no edema Skin: warm, dry    Assessment and Plan   #History of Hodgkin's disease in 1970s-completed  extensive chemotherapy and radiation  # Husband's concern for Depression S: Medication: none  Patient states has been a hard year.  # social update- a lot of frustration with being switched to [employer] by the state and trying to get prolia and repatha. Had molar removed. Hail damage.   She got out to the zoo and out to a mall and has made her feel more human Depression screen [facility] 2/9 10/14/2019 09/20/2019 10/08/2018  Decreased Interest 0 0 0  Down, Depressed, Hopeless 1 0 0  PHQ - 2 Score 1 0 0  Altered sleeping 0 - -  Tired, decreased energy 0 - -  Change in appetite 1 - -  Feeling bad or failure about yourself  0 - -  Trouble concentrating 0 - -  Moving slowly or fidgety/restless 0 - -  Suicidal thoughts 0 - -  PHQ-9 Score 2 - -  Difficult doing work/chores Not difficult at all - -  Some recent data might be hidden  A/P: Patient seems to primarily be coping through a hard year but she does not report any significant depressive symptoms today. We will continue to monitor with phq2 at least yearly or sooner if concerns.    #Cardiovascular issues followed by [facility] cardiology- last visit in february S:  1.  History of  Wolff-Parkinson-White syndrome 2.  History of aortic and mitral valve replacement mechanical-on long-term Coumadin followed at our clinic.  3.  Atrial flutter-on Coumadin for anticoagulation and metoprolol for rate control. Appears to be in sinus rhythm today 4.  Restrictive CHF 01/05/2019 EF up to 50%-has shortness of breath with stairs.  Compliant with Lasix 40 mg daily and spironolactone 25 mg daily.  Denies worsening edema or shortness of breath- stable issues if rushing.  Requires potassium supplement despite spironolactone 5. moderate distal coronary disease by cath 2008- on aspirin in addition to coumadin A/P:  All CV issues are stable- continue current medications.  - she is also  using compression hose which have been somewhat helpful- still can get some pain in back of left leg at night  -We discussed possible referral to vascular surgery-I would likely consider doing this at [place] to help coordinate care - she may ask her [facility] cardiologist vs. Doing this locally  #hypothyroidism S: compliant On thyroid medication-levothyroxine 112 mcg--> 100 mcg last year Lab Results  Component Value Date   TSH 0.53 06/25/2019  A/P: Hopefully controlled-can continue current medication and update TSH today  #CKD stage III S: Patient knows to avoid NSAIDs.  Blood pressure well controlled  .  GFR in mid 50s on last check .  A/P: hopefully controlled- update today    #hyperlipidemia S: compliant with Zetia 10 mg and now with new start of repatha (march 6) due to being  Statin intolerant -History of TIA as well as distal CAD so  ideally should target LDL under 70 Lab Results  Component Value Date   CHOL 213 (H) 04/16/2019   HDL 61.10 04/16/2019   LDLCALC 123 (H) 04/16/2019   LDLDIRECT 68.0 04/28/2018   TRIG 146.0 04/16/2019   CHOLHDL 3 04/16/2019   A/P: I expect patient has had a significant improvement with starting Repatha-she started about 2 months ago so we will go ahead and check a lipid panel  with labs today  #Musculoskeletal issues-doing well recently. States kyphoplasty has done well  Recommended follow up: Return in about 6 months (around 04/15/2020) for physical or sooner if needed. Future Appointments  Date Time Provider [place]  10/26/2019  8:00 AM [facility] None  07/26/2020  9:20 AM Philemon Kingdom, MD [facility] None    Lab/Order associations:   ICD-10-CM   1. Chronic systolic congestive heart failure (HCC)  I50.22   2. Essential hypertension  I10   3. Hypothyroidism, unspecified type  E03.9 TSH  4. Stage 3 chronic kidney disease, unspecified whether stage 3a or 3b CKD  N18.30 Comprehensive metabolic panel  5. Hyperlipidemia, unspecified hyperlipidemia type  E78.5 Lipid panel  6. Other iron deficiency anemia  D50.8   7. Typical atrial flutter (HCC) Chronic I48.3     Meds ordered this encounter  Medications  . warfarin (COUMADIN) 5 MG tablet    Sig: TAKE 1 TABLET EVERY DAY EXCEPT TAKE 1/2 TABLET ON FRIDAYS AS DIRECTED    Dispense:  100 tablet    Refill:  1   Return precautions advised.  Garret Reddish, MD

## 2019-10-13 NOTE — Patient Instructions (Addendum)
Please stop by lab before you go If you have mychart- we will send your results within 3 business days of [place] receiving them.  If you do not have mychart- we will call you about results within 5 business days of [place] receiving them.   Lab needs to release labs from Dr. Rush Landmark on 09/16/19 as well as the 3 labs I ordered today  Glad you are doing well- hoping 2021 continues to be better than 2020- sorry again about the hail damage!   Recommended follow up: Return in about 6 months (around 04/15/2020) for physical or sooner if needed.

## 2019-10-14 ENCOUNTER — Encounter: Payer: Self-pay | Admitting: Family Medicine

## 2019-10-14 ENCOUNTER — Other Ambulatory Visit: Payer: Self-pay

## 2019-10-14 ENCOUNTER — Ambulatory Visit (INDEPENDENT_AMBULATORY_CARE_PROVIDER_SITE_OTHER): Payer: Medicare PPO | Admitting: Family Medicine

## 2019-10-14 VITALS — BP 124/68 | HR 86 | Temp 97.6°F | Ht 60.5 in | Wt 128.0 lb

## 2019-10-14 DIAGNOSIS — I10 Essential (primary) hypertension: Secondary | ICD-10-CM | POA: Diagnosis not present

## 2019-10-14 DIAGNOSIS — E039 Hypothyroidism, unspecified: Secondary | ICD-10-CM | POA: Diagnosis not present

## 2019-10-14 DIAGNOSIS — D508 Other iron deficiency anemias: Secondary | ICD-10-CM

## 2019-10-14 DIAGNOSIS — N183 Chronic kidney disease, stage 3 unspecified: Secondary | ICD-10-CM

## 2019-10-14 DIAGNOSIS — E785 Hyperlipidemia, unspecified: Secondary | ICD-10-CM

## 2019-10-14 DIAGNOSIS — I5022 Chronic systolic (congestive) heart failure: Secondary | ICD-10-CM | POA: Diagnosis not present

## 2019-10-14 DIAGNOSIS — I483 Typical atrial flutter: Secondary | ICD-10-CM | POA: Diagnosis not present

## 2019-10-14 LAB — LIPID PANEL
Cholesterol: 98 mg/dL (ref 0–200)
HDL: 54.4 mg/dL (ref >39.00)
LDL Cholesterol: 21 mg/dL (ref 0–99)
NonHDL: 43.15
Total CHOL/HDL Ratio: 2
Triglycerides: 110 mg/dL (ref 0.0–149.0)
VLDL: 22 mg/dL (ref 0.0–40.0)

## 2019-10-14 LAB — COMPREHENSIVE METABOLIC PANEL
ALT: 19 U/L (ref 0–35)
AST: 28 U/L (ref 0–37)
Albumin: 4.4 g/dL (ref 3.5–5.2)
Alkaline Phosphatase: 75 U/L (ref 39–117)
BUN: 19 mg/dL (ref 6–23)
CO2: 29 mEq/L (ref 19–32)
Calcium: 8.9 mg/dL (ref 8.4–10.5)
Chloride: 103 mEq/L (ref 96–112)
Creatinine, Ser: 1.01 mg/dL (ref 0.40–1.20)
GFR: 54.31 mL/min — ABNORMAL LOW (ref >60.00)
Glucose, Bld: 91 mg/dL (ref 70–99)
Potassium: 4.1 mEq/L (ref 3.5–5.1)
Sodium: 140 mEq/L (ref 135–145)
Total Bilirubin: 0.4 mg/dL (ref 0.2–1.2)
Total Protein: 6.8 g/dL (ref 6.0–8.3)

## 2019-10-14 LAB — FERRITIN: Ferritin: 70.9 ng/mL (ref 10.0–291.0)

## 2019-10-14 LAB — IBC PANEL
Iron: 61 ug/dL (ref 42–145)
Saturation Ratios: 14.5 % — ABNORMAL LOW (ref 20.0–50.0)
Transferrin: 301 mg/dL (ref 212.0–360.0)

## 2019-10-14 LAB — CBC
HCT: 32.2 % — ABNORMAL LOW (ref 36.0–46.0)
Hemoglobin: 10.6 g/dL — ABNORMAL LOW (ref 12.0–15.0)
MCHC: 32.9 g/dL (ref 30.0–36.0)
MCV: 93.8 fl (ref 78.0–100.0)
Platelets: 387 10*3/uL (ref 150.0–400.0)
RBC: 3.43 Mil/uL — ABNORMAL LOW (ref 3.87–5.11)
RDW: 15.8 % — ABNORMAL HIGH (ref 11.5–15.5)
WBC: 12.6 10*3/uL — ABNORMAL HIGH (ref 4.0–10.5)

## 2019-10-14 LAB — TSH: TSH: 2.14 u[IU]/mL (ref 0.35–4.50)

## 2019-10-14 MED ORDER — WARFARIN SODIUM 5 MG PO TABS: ORAL_TABLET | ORAL | 1 refills | Status: DC

## 2019-10-14 NOTE — Addendum Note (Signed)
Addended by: Christiana Fuchs on: 10/14/2019 10:36 AM   Modules accepted: Orders

## 2019-10-19 ENCOUNTER — Other Ambulatory Visit: Payer: Self-pay

## 2019-10-19 DIAGNOSIS — D508 Other iron deficiency anemias: Secondary | ICD-10-CM

## 2019-10-26 ENCOUNTER — Other Ambulatory Visit: Payer: Self-pay

## 2019-10-26 ENCOUNTER — Ambulatory Visit: Payer: Medicare PPO | Admitting: General Practice

## 2019-10-26 DIAGNOSIS — Z952 Presence of prosthetic heart valve: Secondary | ICD-10-CM

## 2019-10-26 DIAGNOSIS — Z7901 Long term (current) use of anticoagulants: Secondary | ICD-10-CM

## 2019-10-26 LAB — POCT INR: INR: 1.8 — AB (ref 2.0–3.0)

## 2019-10-26 NOTE — Patient Instructions (Addendum)
Pre visit review using our clinic review tool, if applicable. No additional management support is needed unless otherwise documented below in the visit note.  Take 1 1/2 tablets today (6/1) and take 1 tablet tomorrow (6/2).  On Thursday change dosage and take 1 tablet daily except 1/2 tablet on Mon Wed and Fridays.  Re-check in in 3 weeks.

## 2019-10-27 ENCOUNTER — Telehealth: Payer: Self-pay | Admitting: Gastroenterology

## 2019-10-27 ENCOUNTER — Telehealth: Payer: Self-pay | Admitting: Oncology

## 2019-10-27 NOTE — Telephone Encounter (Signed)
Spoke with pt and she did not understand that she needed to be seen by a hematologist and that he/she would order the IV Iron. Pt states she will call the cancer center back to schedule the appt with hematology.

## 2019-10-27 NOTE — Telephone Encounter (Signed)
Received a new hem referral from Dr. Rush Landmark for IDA. Pt has been cld and scheduled to see Dr. Alen Blew on 6/3 at 11am. Pt aware to arrive 15 minutes early.

## 2019-10-28 ENCOUNTER — Other Ambulatory Visit: Payer: Self-pay

## 2019-10-28 ENCOUNTER — Inpatient Hospital Stay: Payer: Medicare PPO | Attending: Oncology | Admitting: Oncology

## 2019-10-28 VITALS — BP 122/79 | HR 83 | Temp 98.1°F | Resp 18 | Ht 60.5 in | Wt 125.1 lb

## 2019-10-28 DIAGNOSIS — Z9221 Personal history of antineoplastic chemotherapy: Secondary | ICD-10-CM | POA: Insufficient documentation

## 2019-10-28 DIAGNOSIS — Z7982 Long term (current) use of aspirin: Secondary | ICD-10-CM | POA: Insufficient documentation

## 2019-10-28 DIAGNOSIS — E785 Hyperlipidemia, unspecified: Secondary | ICD-10-CM | POA: Insufficient documentation

## 2019-10-28 DIAGNOSIS — Z79899 Other long term (current) drug therapy: Secondary | ICD-10-CM | POA: Diagnosis not present

## 2019-10-28 DIAGNOSIS — N189 Chronic kidney disease, unspecified: Secondary | ICD-10-CM | POA: Diagnosis not present

## 2019-10-28 DIAGNOSIS — D649 Anemia, unspecified: Secondary | ICD-10-CM

## 2019-10-28 DIAGNOSIS — Z8571 Personal history of Hodgkin lymphoma: Secondary | ICD-10-CM | POA: Diagnosis not present

## 2019-10-28 DIAGNOSIS — D509 Iron deficiency anemia, unspecified: Secondary | ICD-10-CM | POA: Insufficient documentation

## 2019-10-28 DIAGNOSIS — Z7901 Long term (current) use of anticoagulants: Secondary | ICD-10-CM | POA: Diagnosis not present

## 2019-10-28 DIAGNOSIS — I509 Heart failure, unspecified: Secondary | ICD-10-CM | POA: Diagnosis not present

## 2019-10-28 DIAGNOSIS — Z8673 Personal history of transient ischemic attack (TIA), and cerebral infarction without residual deficits: Secondary | ICD-10-CM | POA: Diagnosis not present

## 2019-10-28 DIAGNOSIS — I2510 Atherosclerotic heart disease of native coronary artery without angina pectoris: Secondary | ICD-10-CM | POA: Insufficient documentation

## 2019-10-28 DIAGNOSIS — E039 Hypothyroidism, unspecified: Secondary | ICD-10-CM | POA: Diagnosis not present

## 2019-10-28 DIAGNOSIS — Z923 Personal history of irradiation: Secondary | ICD-10-CM | POA: Diagnosis not present

## 2019-10-28 NOTE — Progress Notes (Signed)
Reason for the request:    Anemia  HPI: I was asked by Dr. Rush Landmark  to evaluate Grace [name] for diagnosis of iron deficiency anemia.  She is a 69 year old woman with history of Hodgkin's disease treated in the 25 with chemotherapy and radiation subsequently developed cardiomyopathy as well as other comorbid conditions.  She was diagnosed with iron deficiency anemia intermittently dating back to 2012.  At that time her iron level was 47 with saturation of 9% and ferritin of 21 that declined to 12 back in 2019.  She developed worsening iron deficiency in May 2020 at that time her iron level was 33 with saturation of 7.9% and ferritin of 52.  She received intravenous iron infusion utilizing Feraheme at 510 mg on 2 separate occasions on September 22 and March 01, 2019.  She also had a GI work-up including colonoscopy and endoscopy without any source of bleeding.  In May 2021 her hemoglobin was 10.6, white cell count of 4.6 with elevated RDW of 15.8.  Iron studies at that time showed iron level of 61, saturation percentage of 14.5 with normal ferritin of 70.9.  Clinically, she reports no specific complaints at this time.  She denies any excessive fatigue tiredness or dyspnea on exertion.  She denies any nausea or abdominal pain.  She denies any hematochezia or melena.     She does not report any headaches, blurry vision, syncope or seizures. Does not report any fevers, chills or sweats.  Does not report any cough, wheezing or hemoptysis.  Does not report any chest pain, palpitation, orthopnea or leg edema.  Does not report any nausea, vomiting or abdominal pain.  Does not report any constipation or diarrhea.  Does not report any skeletal complaints.    Does not report frequency, urgency or hematuria.  Does not report any skin rashes or lesions. Does not report any heat or cold intolerance.  Does not report any lymphadenopathy or petechiae.  Does not report any anxiety or depression.  Remaining review of  systems is negative.    Past Medical History:  Diagnosis Date  . Anemia   . Aortic insufficiency   . AORTIC VALVE REPLACEMENT, HX OF 04/27/2007   Qualifier: Diagnosis of  By: Leanne Chang MD, Bruce    . Cancer [facility])    left breast- surgery and chemo  . CHF (congestive heart failure) ([place])   . Chronic kidney disease   . Congestive heart failure ([place]) 08/03/2009   [facility] Dr. Paul Half. Last echo 2010 with EF 30%. Restrictive due to radiation. AVR MVR. 02/2014 visit planned.    . Coronary artery disease    11/06/06 [facility]): 40% oLM, 60% dLAD, 30% oRCA  . Dysrhythmia    paroxsymal a-flutter 2018; intermittent left BBB (2016)  . Edema of both feet   . GI bleed   . Heart murmur    mitral and aortic valve replacement  . Hodgkin's disease    ~1974, s/p Mantle field radiation and chemo  . Hyperlipidemia   . Hypothyroidism   . Migraine    none since 2008  . MITRAL VALVE REPLACEMENT, HX OF 04/27/2007   Qualifier: Diagnosis of  By: Leanne Chang MD, Bruce    . Pericarditis    s/p pericardidal stripping through lateral thoracotomy 01/2007 [facility])  . Stroke [facility]) 2008   No residual effects  . TIA (transient ischemic attack)   . Tricuspid regurgitation   . Varicose veins of both lower extremities    left worse than right- have gotten larger  in April 2020  . WPW (Wolff-Parkinson-White syndrome)   :  Past Surgical History:  Procedure Laterality Date  . AORTIC VALVE SURGERY     19 mm mechanical [employer] AVR 05/2006 [facility], Dr. Lajuana Matte)  . bilateral breast implants    . BIOPSY  02/22/2019   Procedure: BIOPSY;  Surgeon: Rush Landmark Telford Nab., MD;  Location: [place];  Service: Gastroenterology;;  . COLONOSCOPY WITH PROPOFOL N/A 02/22/2019   Procedure: COLONOSCOPY WITH PROPOFOL;  Surgeon: Irving Copas., MD;  Location: [place];  Service: Gastroenterology;  Laterality: N/A;  . ENDOMETRIAL ABLATION    . ENTEROSCOPY N/A 02/22/2019   Procedure: ENTEROSCOPY;  Surgeon: Mansouraty, Telford Nab.,  MD;  Location: [place];  Service: Gastroenterology;  Laterality: N/A;  . EXPLORATORY LAPAROTOMY    . HEMOSTASIS CLIP PLACEMENT  02/22/2019   Procedure: HEMOSTASIS CLIP PLACEMENT;  Surgeon: Irving Copas., MD;  Location: [place];  Service: Gastroenterology;;  . KYPHOPLASTY N/A 12/10/2018   Procedure: KYPHOPLASTY THORACIC SIX- THORACIC SEVEN;  Surgeon: Newman Pies, MD;  Location: [place];  Service: Neurosurgery;  Laterality: N/A;  KYPHOPLASTY THORACIC SIX- THORACIC SEVEN  . MASTECTOMY    . MITRAL VALVE REPLACEMENT     25 mm [employer] MVR, 05/2006, Dr. Lajuana Matte, [facility]  . pericardial stripping  9/08  . POLYPECTOMY  02/22/2019   Procedure: POLYPECTOMY;  Surgeon: Mansouraty, Telford Nab., MD;  Location: [place];  Service: Gastroenterology;;  . SPLENECTOMY     ~ 1974  . SPLENECTOMY, TOTAL    . SUBMUCOSAL TATTOO INJECTION  02/22/2019   Procedure: SUBMUCOSAL TATTOO INJECTION;  Surgeon: Irving Copas., MD;  Location: [place];  Service: Gastroenterology;;  . THORACOTOMY     for pericardial stripping 01/2007 (Dr. Elenor Quinones, [facility])  . TONSILLECTOMY    . valvulopathy  06/13/06  :   Current Outpatient Medications:  .  amoxicillin (AMOXIL) 500 MG capsule, Take 500 mg by mouth as directed. 4 tablets prior to dental procedures, Disp: , Rfl:  .  aspirin 81 MG tablet, Take 81 mg by mouth daily.  , Disp: , Rfl:  .  aspirin-acetaminophen-caffeine (EXCEDRIN MIGRAINE) 250-250-65 MG tablet, Take 2 tablets by mouth every 6 (six) hours as needed for headache., Disp: , Rfl:  .  cholecalciferol (VITAMIN D3) 25 MCG (1000 UT) tablet, Take 1,000 Units by mouth daily., Disp: , Rfl:  .  denosumab (PROLIA) 60 MG/ML SOSY injection, Inject 60 mg into the skin every 6 (six) months. , Disp: , Rfl:  .  diphenhydramine-acetaminophen (TYLENOL PM) 25-500 MG TABS tablet, Take 1 tablet by mouth at bedtime as needed (sleep). , Disp: , Rfl:  .  docusate sodium (COLACE) 100 MG capsule, Take 100 mg by  mouth daily. , Disp: , Rfl:  .  ezetimibe (ZETIA) 10 MG tablet, Take 10 mg by mouth daily. , Disp: , Rfl:  .  ferrous sulfate 325 (65 FE) MG tablet, Take 325 mg by mouth 2 (two) times a day. , Disp: , Rfl:  .  furosemide (LASIX) 40 MG tablet, TAKE 1 TABLET EVERY DAY, Disp: 90 tablet, Rfl: 1 .  levothyroxine (SYNTHROID) 100 MCG tablet, Take 1 tablet (100 mcg total) by mouth daily., Disp: 90 tablet, Rfl: 3 .  Magnesium 250 MG TABS, Take 250 mg by mouth daily. , Disp: , Rfl:  .  metoprolol (TOPROL-XL) 50 MG 24 hr tablet, Take 50 mg by mouth 2 (two) times daily. , Disp: , Rfl:  .  omeprazole (PRILOSEC) 20 MG capsule,  Take 1 capsule (20 mg total) by mouth daily. Take 30 minutes before breakfast or dinner., Disp: 90 capsule, Rfl: 3 .  potassium chloride SA (KLOR-CON) 20 MEQ tablet, Take 1 tablet (20 mEq total) by mouth daily., Disp: 90 tablet, Rfl: 3 .  REPATHA SURECLICK 295 MG/ML SOAJ, Inject 140 mg into the skin every 14 (fourteen) days., Disp: , Rfl:  .  spironolactone (ALDACTONE) 25 MG tablet, Take 25 mg by mouth 2 (two) times daily. , Disp: , Rfl:  .  warfarin (COUMADIN) 5 MG tablet, TAKE 1 TABLET EVERY DAY EXCEPT TAKE 1/2 TABLET ON FRIDAYS AS DIRECTED, Disp: 100 tablet, Rfl: 1:  Allergies  Allergen Reactions  . Digoxin Other (See Comments)  . Statins     myalgia  :  Family History  Problem Relation Age of Onset  . Heart disease Mother   . Cancer Mother        breast  . Other Father        hunting gun accident  . Heart disease Maternal Aunt   . Cancer Maternal Aunt        uterine  . Heart disease Maternal Grandmother   . Diabetes Maternal Grandmother   . Hypercalcemia Neg Hx   . Colon cancer Neg Hx   . Esophageal cancer Neg Hx   . Liver disease Neg Hx   . Pancreatic cancer Neg Hx   . Rectal cancer Neg Hx   . Stomach cancer Neg Hx   :  Social History   Socioeconomic History  . Marital status: Married    Spouse name: Not on file  . Number of children: 0  . Years of  education: Not on file  . Highest education level: Not on file  Occupational History  . Occupation: retired  Tobacco Use  . Smoking status: Never Smoker  . Smokeless tobacco: Never Used  Substance and Sexual Activity  . Alcohol use: No  . Drug use: No  . Sexual activity: Not on file  Other Topics Concern  . Not on file  Social History Narrative   Lived in [place] for 18 years. Moved here for the weather.    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      Husband retired January 2015. Married 1991 (2nd marriage). No kids.       Hobbies: walks 2 miles per day, read, work outside         [employer] broadcast assistant Strain:   . Difficulty of Paying Living Expenses:   Food Insecurity:   . Worried About Charity fundraiser in the Last Year:   . Arboriculturist in the Last Year:   Transportation Needs:   . Film/video editor (Medical):   Marland Kitchen Lack of Transportation (Non-Medical):   Physical Activity:   . Days of Exercise per Week:   . Minutes of Exercise per Session:   Stress:   . Feeling of Stress :   Social Connections:   . Frequency of Communication with Friends and Family:   . Frequency of Social Gatherings with Friends and Family:   . Attends Religious Services:   . Active Member of Clubs or Organizations:   . Attends Archivist Meetings:   Marland Kitchen Marital Status:   Intimate Partner Violence:   . Fear of Current or Ex-Partner:   . Emotionally Abused:   Marland Kitchen Physically Abused:   . Sexually Abused:   :  Pertinent items are noted in HPI.  Exam:  Blood pressure 122/79, pulse 83, temperature 98.1 F (36.7 C), temperature source Temporal, resp. rate 18, height 5' 0.5" (1.537 m), weight 125 lb 1.6 oz (56.7 kg), SpO2 100 %.  ECOG 1   General appearance: alert and cooperative appeared without distress. Head: atraumatic without any abnormalities. Eyes: conjunctivae/corneas clear.  PERRL.  Sclera anicteric. Throat: lips, mucosa, and tongue normal; without oral thrush or ulcers. Resp: clear to auscultation bilaterally without rhonchi, wheezes or dullness to percussion. Cardio: regular rate and rhythm, S1, S2 normal, no murmur, click, rub or gallop GI: soft, non-tender; bowel sounds normal; no masses,  no organomegaly Skin: Skin color, texture, turgor normal. No rashes or lesions Lymph nodes: Cervical, supraclavicular, and axillary nodes normal. Neurologic: Grossly normal without any motor, sensory or deep tendon reflexes. Musculoskeletal: No joint deformity or effusion.  CBC    Component Value Date/Time   WBC 12.6 (H) 10/14/2019 1036   RBC 3.43 (L) 10/14/2019 1036   HGB 10.6 (L) 10/14/2019 1036   HCT 32.2 (L) 10/14/2019 1036   PLT 387.0 10/14/2019 1036   MCV 93.8 10/14/2019 1036   MCH 29.8 12/07/2018 0941   MCHC 32.9 10/14/2019 1036   RDW 15.8 (H) 10/14/2019 1036   LYMPHSABS 0.7 07/20/2019 0851   MONOABS 1.7 (H) 07/20/2019 0851   EOSABS 0.3 07/20/2019 0851   BASOSABS 0.2 (H) 07/20/2019 0851     Chemistry      Component Value Date/Time   NA 140 10/14/2019 1033   K 4.1 10/14/2019 1033   CL 103 10/14/2019 1033   CO2 29 10/14/2019 1033   BUN 19 10/14/2019 1033   CREATININE 1.01 10/14/2019 1033   CREATININE 1.36 (H) 01/26/2019 1410      Component Value Date/Time   CALCIUM 8.9 10/14/2019 1033   ALKPHOS 75 10/14/2019 1033   AST 28 10/14/2019 1033   ALT 19 10/14/2019 1033   BILITOT 0.4 10/14/2019 1033       Assessment and Plan:   69 year old woman with:  1.  Anemia with element of iron deficiency dating back to 2012 and documented again in 2019 and 2020.  He did receive intravenous iron and March 01, 2019 with normalization of her hemoglobin up to 12.4 in February 2021.  Currently her hemoglobin around 10.6 with elevated RDW and borderline decrease in her iron saturation.  The differential diagnosis of these findings were reviewed at this time.   Iron deficiency anemia related to poor iron absorption is likely the cause of her degree of anemia.  Given her history of previous radiation and chemotherapy from Hodgkin's disease she could have an element of bone marrow disease or myelodysplasia could also be contributing to her.  Treatment options were discussed at this time which includes oral iron therapy, repeating intravenous iron as well as growth factor support in the form of Aranesp or Procrit after a bone marrow biopsy were reviewed.  After discussion today, she is agreeable to proceed with IV iron and she will continue to take oral iron therapy for maintenance purposes.  If her hemoglobin continues to be abnormal despite IV iron replacement, bone marrow biopsy will be needed.  2.  Hodgkin's disease: She was diagnosed in his 21s and was treated with mantle field radiation followed by MOPP chemotherapy.  Her disease is cured but the effect on bone marrow would be possible and future development of MDS and leukemia was discussed.  At this time I see no evidence to suggest  these conditions currently but will need to monitor moving forward.  3.  Breast cancer: She was diagnosed with stage II disease and underwent bilateral mastectomy and reconstruction followed by CMF chemotherapy 1992.  She has no evidence of disease relapse at this time.   4.  Follow-up: She will have repeat intravenous iron in the near future and the follow-up in 6 months.  45  minutes were dedicated to this visit. The time was spent on reviewing laboratory data, discussing treatment options, discussing differential diagnosis and answering questions regarding future plan.    A copy of this consult has been forwarded to the requesting physician.

## 2019-10-29 ENCOUNTER — Telehealth: Payer: Self-pay | Admitting: Oncology

## 2019-10-29 NOTE — Telephone Encounter (Signed)
Scheduled appt per 6/3 los.  Spoke with pt and they are aware of the appt date and time.   

## 2019-11-09 ENCOUNTER — Inpatient Hospital Stay: Payer: Medicare PPO

## 2019-11-09 ENCOUNTER — Other Ambulatory Visit: Payer: Self-pay

## 2019-11-09 VITALS — BP 104/60 | HR 74 | Temp 97.9°F | Resp 18

## 2019-11-09 DIAGNOSIS — E039 Hypothyroidism, unspecified: Secondary | ICD-10-CM | POA: Diagnosis not present

## 2019-11-09 DIAGNOSIS — I509 Heart failure, unspecified: Secondary | ICD-10-CM | POA: Diagnosis not present

## 2019-11-09 DIAGNOSIS — I2510 Atherosclerotic heart disease of native coronary artery without angina pectoris: Secondary | ICD-10-CM | POA: Diagnosis not present

## 2019-11-09 DIAGNOSIS — D649 Anemia, unspecified: Secondary | ICD-10-CM

## 2019-11-09 DIAGNOSIS — D509 Iron deficiency anemia, unspecified: Secondary | ICD-10-CM | POA: Diagnosis not present

## 2019-11-09 DIAGNOSIS — N189 Chronic kidney disease, unspecified: Secondary | ICD-10-CM | POA: Diagnosis not present

## 2019-11-09 DIAGNOSIS — Z8571 Personal history of Hodgkin lymphoma: Secondary | ICD-10-CM | POA: Diagnosis not present

## 2019-11-09 DIAGNOSIS — Z7901 Long term (current) use of anticoagulants: Secondary | ICD-10-CM | POA: Diagnosis not present

## 2019-11-09 DIAGNOSIS — E785 Hyperlipidemia, unspecified: Secondary | ICD-10-CM | POA: Diagnosis not present

## 2019-11-09 DIAGNOSIS — Z7982 Long term (current) use of aspirin: Secondary | ICD-10-CM | POA: Diagnosis not present

## 2019-11-09 LAB — CBC WITH DIFFERENTIAL (CANCER CENTER ONLY)
Abs Immature Granulocytes: 0.2 10*3/uL — ABNORMAL HIGH (ref 0.00–0.07)
Basophils Absolute: 0.1 10*3/uL (ref 0.0–0.1)
Basophils Relative: 1 %
Eosinophils Absolute: 0.3 10*3/uL (ref 0.0–0.5)
Eosinophils Relative: 2 %
HCT: 33.4 % — ABNORMAL LOW (ref 36.0–46.0)
Hemoglobin: 10.2 g/dL — ABNORMAL LOW (ref 12.0–15.0)
Immature Granulocytes: 2 %
Lymphocytes Relative: 9 %
Lymphs Abs: 1.1 10*3/uL (ref 0.7–4.0)
MCH: 30.6 pg (ref 26.0–34.0)
MCHC: 30.5 g/dL (ref 30.0–36.0)
MCV: 100.3 fL — ABNORMAL HIGH (ref 80.0–100.0)
Monocytes Absolute: 1.9 10*3/uL — ABNORMAL HIGH (ref 0.1–1.0)
Monocytes Relative: 15 %
Neutro Abs: 8.6 10*3/uL — ABNORMAL HIGH (ref 1.7–7.7)
Neutrophils Relative %: 71 %
Platelet Count: 500 10*3/uL — ABNORMAL HIGH (ref 150–400)
RBC: 3.33 MIL/uL — ABNORMAL LOW (ref 3.87–5.11)
RDW: 17.2 % — ABNORMAL HIGH (ref 11.5–15.5)
WBC Count: 12.1 10*3/uL — ABNORMAL HIGH (ref 4.0–10.5)
nRBC: 0.6 % — ABNORMAL HIGH (ref 0.0–0.2)

## 2019-11-09 LAB — IRON AND TIBC
Iron: 163 ug/dL — ABNORMAL HIGH (ref 41–142)
Saturation Ratios: 40 % (ref 21–57)
TIBC: 408 ug/dL (ref 236–444)
UIBC: 245 ug/dL (ref 120–384)

## 2019-11-09 LAB — FERRITIN: Ferritin: 66 ng/mL (ref 11–307)

## 2019-11-09 MED ORDER — SODIUM CHLORIDE 0.9 % IV SOLN
510.0000 mg | Freq: Once | INTRAVENOUS | Status: AC
  Administered 2019-11-09: 510 mg via INTRAVENOUS
  Filled 2019-11-09: qty 510

## 2019-11-09 MED ORDER — SODIUM CHLORIDE 0.9 % IV SOLN
Freq: Once | INTRAVENOUS | Status: AC
  Filled 2019-11-09: qty 250

## 2019-11-09 NOTE — Patient Instructions (Signed)

## 2019-11-15 ENCOUNTER — Telehealth: Payer: Self-pay | Admitting: Gastroenterology

## 2019-11-15 NOTE — Telephone Encounter (Signed)
The pt wanted to reschedule her capsule endo to a later date due to having iron infusions this week.  Appt moved to 7/2.  Pt verbalized understanding.

## 2019-11-15 NOTE — Telephone Encounter (Signed)
Pt is requesting a call back from a nurse regarding her capsule endo scheduled on Friday 6/25.

## 2019-11-16 ENCOUNTER — Other Ambulatory Visit: Payer: Self-pay

## 2019-11-16 ENCOUNTER — Ambulatory Visit: Payer: Self-pay | Admitting: General Practice

## 2019-11-16 ENCOUNTER — Ambulatory Visit: Payer: Medicare PPO | Admitting: General Practice

## 2019-11-16 DIAGNOSIS — Z952 Presence of prosthetic heart valve: Secondary | ICD-10-CM | POA: Diagnosis not present

## 2019-11-16 DIAGNOSIS — Z7901 Long term (current) use of anticoagulants: Secondary | ICD-10-CM

## 2019-11-16 LAB — POCT INR: INR: 2.7 (ref 2.0–3.0)

## 2019-11-16 NOTE — Progress Notes (Signed)
Medical screening examination/treatment/procedure(s) were performed by non-physician practitioner and as supervising physician I was immediately available for consultation/collaboration. I agree with above. Abeni Finchum, MD   

## 2019-11-16 NOTE — Patient Instructions (Signed)
Pre visit review using our clinic review tool, if applicable. No additional management support is needed unless otherwise documented below in the visit note.  Continue to take 1 tablet daily except 1/2 tablet on Mon Wed and Fridays.  Re-check in in 4 weeks.

## 2019-11-17 ENCOUNTER — Other Ambulatory Visit (HOSPITAL_COMMUNITY): Admission: RE | Admit: 2019-11-17 | Payer: Medicare PPO | Source: Ambulatory Visit

## 2019-11-17 ENCOUNTER — Inpatient Hospital Stay: Payer: Medicare PPO

## 2019-11-17 ENCOUNTER — Other Ambulatory Visit: Payer: Self-pay

## 2019-11-17 VITALS — BP 103/58 | HR 80 | Temp 98.2°F | Resp 16

## 2019-11-17 DIAGNOSIS — I2510 Atherosclerotic heart disease of native coronary artery without angina pectoris: Secondary | ICD-10-CM | POA: Diagnosis not present

## 2019-11-17 DIAGNOSIS — D649 Anemia, unspecified: Secondary | ICD-10-CM

## 2019-11-17 DIAGNOSIS — N189 Chronic kidney disease, unspecified: Secondary | ICD-10-CM | POA: Diagnosis not present

## 2019-11-17 DIAGNOSIS — D509 Iron deficiency anemia, unspecified: Secondary | ICD-10-CM | POA: Diagnosis not present

## 2019-11-17 DIAGNOSIS — I509 Heart failure, unspecified: Secondary | ICD-10-CM | POA: Diagnosis not present

## 2019-11-17 DIAGNOSIS — E039 Hypothyroidism, unspecified: Secondary | ICD-10-CM | POA: Diagnosis not present

## 2019-11-17 DIAGNOSIS — Z7982 Long term (current) use of aspirin: Secondary | ICD-10-CM | POA: Diagnosis not present

## 2019-11-17 DIAGNOSIS — Z7901 Long term (current) use of anticoagulants: Secondary | ICD-10-CM | POA: Diagnosis not present

## 2019-11-17 DIAGNOSIS — E785 Hyperlipidemia, unspecified: Secondary | ICD-10-CM | POA: Diagnosis not present

## 2019-11-17 DIAGNOSIS — Z8571 Personal history of Hodgkin lymphoma: Secondary | ICD-10-CM | POA: Diagnosis not present

## 2019-11-17 LAB — LIPID PANEL
Cholesterol: 92 mg/dL (ref 0–200)
HDL: 59 mg/dL (ref >40)
LDL Cholesterol: 7 mg/dL (ref 0–99)
Total CHOL/HDL Ratio: 1.6 RATIO
Triglycerides: 128 mg/dL (ref <150)
VLDL: 26 mg/dL (ref 0–40)

## 2019-11-17 MED ORDER — SODIUM CHLORIDE 0.9 % IV SOLN
Freq: Once | INTRAVENOUS | Status: AC
  Filled 2019-11-17: qty 250

## 2019-11-17 MED ORDER — SODIUM CHLORIDE 0.9 % IV SOLN
510.0000 mg | Freq: Once | INTRAVENOUS | Status: AC
  Administered 2019-11-17: 510 mg via INTRAVENOUS
  Filled 2019-11-17: qty 510

## 2019-11-17 NOTE — Progress Notes (Signed)
Pt declined to stay for 30 minute post-observation  

## 2019-11-17 NOTE — Patient Instructions (Signed)

## 2019-11-26 ENCOUNTER — Encounter: Payer: Self-pay | Admitting: Gastroenterology

## 2019-11-26 ENCOUNTER — Ambulatory Visit (INDEPENDENT_AMBULATORY_CARE_PROVIDER_SITE_OTHER): Payer: Medicare PPO | Admitting: Gastroenterology

## 2019-11-26 DIAGNOSIS — D509 Iron deficiency anemia, unspecified: Secondary | ICD-10-CM | POA: Diagnosis not present

## 2019-11-26 NOTE — Patient Instructions (Signed)

## 2019-11-26 NOTE — Progress Notes (Signed)
SN: [id number] Exp: 11/11/2020 LOT: [birth year] Patient arrived for Capsule Endoscopy. Reported the prep went well. This nurse explained capsule retrieval process and dietary restrictions for the next few hours. Patient verbalized understanding. Opened capsule, ensured capsule was flashing prior to the patient swallowing the capsule. Patient swallowed capsule without difficulty. Patient given retrieval supplies. Patient told to call the office with any questions and if no capsule was retrieved after 72 hours. No further questions by the conclusion of the visit

## 2019-12-02 NOTE — Progress Notes (Signed)
Medical screening examination/treatment/procedure(s) were performed by non-physician practitioner and as supervising physician I was immediately available for consultation/collaboration. I agree with above. Selinda Korzeniewski, MD   

## 2019-12-14 ENCOUNTER — Other Ambulatory Visit: Payer: Self-pay

## 2019-12-14 ENCOUNTER — Ambulatory Visit: Payer: Medicare PPO | Admitting: General Practice

## 2019-12-14 DIAGNOSIS — Z7901 Long term (current) use of anticoagulants: Secondary | ICD-10-CM

## 2019-12-14 DIAGNOSIS — Z952 Presence of prosthetic heart valve: Secondary | ICD-10-CM | POA: Diagnosis not present

## 2019-12-14 LAB — POCT INR: INR: 2.7 (ref 2.0–3.0)

## 2019-12-14 NOTE — Patient Instructions (Addendum)
Pre visit review using our clinic review tool, if applicable. No additional management support is needed unless otherwise documented below in the visit note.  Continue to take 1 tablet daily except 1/2 tablet on Mon Wed and Fridays.  Re-check in in 6 weeks.    

## 2019-12-14 NOTE — Progress Notes (Signed)
Medical screening examination/treatment/procedure(s) were performed by non-physician practitioner and as supervising physician I was immediately available for consultation/collaboration. I agree with above. Maria Gallicchio, MD   

## 2019-12-16 ENCOUNTER — Telehealth: Payer: Self-pay | Admitting: Gastroenterology

## 2019-12-16 NOTE — Telephone Encounter (Signed)
I reviewed this patient's capsule endoscopy. There is a polypoid lesion in the stomach with adherent heme and stigmata for bleeding, with erosive changes / ulceration noted. No other obvious pathology noted in the stomach and small bowel to cause anemia. I think this is the only polyp in the stomach, but possible there could be multiple polyps, the capsule was in the stomach a fair amount of time. Assuming this is the cause of her iron deficiency, recommend EGD to further evaluate and remove this.  Gabe. I think this is your patient, recommending EGD. Will get you the report from the capsule so you can see this lesion.

## 2019-12-20 NOTE — Telephone Encounter (Signed)
Patty, Please let the patient know that when I return to the office later this week, I will reach out to her with next steps in her evaluation. Thanks. GM

## 2019-12-20 NOTE — Telephone Encounter (Signed)
The patient has been notified of this information and all questions answered. She will await a call from Dr Rush Landmark

## 2019-12-22 NOTE — Telephone Encounter (Signed)
Patty, I called and spoke with the patient today about the results of her video capsule endoscopy. Please print out a report and send it to the patient via mail. The patient needs to have an EGD in the hospital-based setting for attempt at evaluation of this gastric lesion that could be a source for iron deficiency. I have spoken with the patient, and she is available on my hospital week. Please schedule as an EGD 60-minute case starting at 8 AM on one of my hospital days. Thanks. GM

## 2019-12-23 ENCOUNTER — Other Ambulatory Visit: Payer: Self-pay

## 2019-12-23 DIAGNOSIS — K319 Disease of stomach and duodenum, unspecified: Secondary | ICD-10-CM

## 2019-12-23 NOTE — Telephone Encounter (Signed)
The pt has been scheduled for EGD at [facility] on 01/10/20 at 8 am.  COVID test on 8/13.  The pt has been advised and all information sent to the pt via My chart (pt communication in the past) Also, anti coag response letter sent to Dr [name].

## 2019-12-29 ENCOUNTER — Other Ambulatory Visit: Payer: Self-pay | Admitting: General Practice

## 2019-12-29 DIAGNOSIS — Z7901 Long term (current) use of anticoagulants: Secondary | ICD-10-CM

## 2019-12-29 MED ORDER — ENOXAPARIN SODIUM 60 MG/0.6ML ~~LOC~~ SOLN: 60.0000 mg | Freq: Two times a day (BID) | SUBCUTANEOUS | 0 refills | Status: DC

## 2020-01-05 ENCOUNTER — Telehealth: Payer: Self-pay | Admitting: Family Medicine

## 2020-01-05 ENCOUNTER — Other Ambulatory Visit: Payer: Self-pay | Admitting: General Practice

## 2020-01-05 ENCOUNTER — Telehealth: Payer: Self-pay

## 2020-01-05 DIAGNOSIS — Z7901 Long term (current) use of anticoagulants: Secondary | ICD-10-CM

## 2020-01-05 MED ORDER — ENOXAPARIN SODIUM 60 MG/0.6ML ~~LOC~~ SOLN: 60.0000 mg | Freq: Two times a day (BID) | SUBCUTANEOUS | 0 refills | Status: DC

## 2020-01-05 NOTE — Telephone Encounter (Signed)
-----   Message from Timothy Lasso, RN sent at 12/23/2019 11:31 AM EDT ----- Anti coag response needed

## 2020-01-05 NOTE — Telephone Encounter (Signed)
Noted  

## 2020-01-05 NOTE — Telephone Encounter (Signed)
Grace [name] from [facility] stated she needs the approval to have Patient hold medication warfarin (COUMADIN) 5 MG tablet starting today to have her ready for her procedure on the 16th.

## 2020-01-05 NOTE — Telephone Encounter (Signed)
The instructions for the coumadin hold are in Epic and the pt is aware.

## 2020-01-05 NOTE — Telephone Encounter (Signed)
Error

## 2020-01-05 NOTE — Telephone Encounter (Signed)
Please disregard previous message pattie called back and seen a message in my chart where patient was instructed to hold medication.  Thank you!

## 2020-01-06 ENCOUNTER — Other Ambulatory Visit (HOSPITAL_COMMUNITY)
Admission: RE | Admit: 2020-01-06 | Discharge: 2020-01-06 | Disposition: A | Discharge status: Home / Self Care | Payer: Medicare PPO | Source: Ambulatory Visit | Attending: Gastroenterology | Admitting: Gastroenterology

## 2020-01-06 DIAGNOSIS — Z20822 Contact with and (suspected) exposure to covid-19: Secondary | ICD-10-CM | POA: Insufficient documentation

## 2020-01-06 DIAGNOSIS — Z01812 Encounter for preprocedural laboratory examination: Secondary | ICD-10-CM | POA: Diagnosis not present

## 2020-01-06 LAB — SARS CORONAVIRUS 2 (TAT 6-24 HRS): SARS Coronavirus 2: NEGATIVE

## 2020-01-07 ENCOUNTER — Encounter (HOSPITAL_COMMUNITY): Payer: Self-pay | Admitting: Gastroenterology

## 2020-01-07 ENCOUNTER — Other Ambulatory Visit: Payer: Self-pay

## 2020-01-07 NOTE — Progress Notes (Signed)
Spoke with pt for pre-op call. Pt has extensive cardiac history, cardiologist is Dr. Alycia Rossetti at [facility]. Pt denies any recent chest pain, states she has exertional shortness of breath at times. Pt is on Coumadin for hx of mitral and aortic valve replacement. Pt's PCP, Dr. [name] instructed her to stop Coumadin 5 days prior to surgery. Last dose was 01/05/20. Pt is being bridged with Lovenox (see med list). Pt is to continue the 81 mg Aspirin.  Covid test done 01/06/20 and it's negative. Pt states she has been in quarantine since the test was done and will stay in quarantine until she comes to the hospital on Monday.  EKG has been requested from [facility].

## 2020-01-10 ENCOUNTER — Encounter (HOSPITAL_COMMUNITY): Payer: Self-pay | Admitting: Gastroenterology

## 2020-01-10 ENCOUNTER — Encounter (HOSPITAL_COMMUNITY)
Admission: RE | Disposition: A | Discharge status: Home / Self Care | Payer: Self-pay | Source: Home / Self Care | Attending: Gastroenterology

## 2020-01-10 ENCOUNTER — Telehealth: Payer: Self-pay | Admitting: General Practice

## 2020-01-10 ENCOUNTER — Ambulatory Visit (HOSPITAL_COMMUNITY): Payer: Medicare PPO | Admitting: Certified Registered"

## 2020-01-10 ENCOUNTER — Ambulatory Visit (HOSPITAL_COMMUNITY)
Admission: RE | Admit: 2020-01-10 | Discharge: 2020-01-10 | Disposition: A | Discharge status: Home / Self Care | Payer: Medicare PPO | Attending: Gastroenterology | Admitting: Gastroenterology

## 2020-01-10 ENCOUNTER — Other Ambulatory Visit: Payer: Self-pay

## 2020-01-10 DIAGNOSIS — E785 Hyperlipidemia, unspecified: Secondary | ICD-10-CM | POA: Insufficient documentation

## 2020-01-10 DIAGNOSIS — Z8571 Personal history of Hodgkin lymphoma: Secondary | ICD-10-CM | POA: Insufficient documentation

## 2020-01-10 DIAGNOSIS — E039 Hypothyroidism, unspecified: Secondary | ICD-10-CM | POA: Insufficient documentation

## 2020-01-10 DIAGNOSIS — I130 Hypertensive heart and chronic kidney disease with heart failure and stage 1 through stage 4 chronic kidney disease, or unspecified chronic kidney disease: Secondary | ICD-10-CM | POA: Diagnosis not present

## 2020-01-10 DIAGNOSIS — K317 Polyp of stomach and duodenum: Secondary | ICD-10-CM | POA: Diagnosis not present

## 2020-01-10 DIAGNOSIS — Z8249 Family history of ischemic heart disease and other diseases of the circulatory system: Secondary | ICD-10-CM | POA: Diagnosis not present

## 2020-01-10 DIAGNOSIS — Z952 Presence of prosthetic heart valve: Secondary | ICD-10-CM | POA: Diagnosis not present

## 2020-01-10 DIAGNOSIS — R933 Abnormal findings on diagnostic imaging of other parts of digestive tract: Secondary | ICD-10-CM | POA: Diagnosis not present

## 2020-01-10 DIAGNOSIS — I509 Heart failure, unspecified: Secondary | ICD-10-CM | POA: Insufficient documentation

## 2020-01-10 DIAGNOSIS — Z8673 Personal history of transient ischemic attack (TIA), and cerebral infarction without residual deficits: Secondary | ICD-10-CM | POA: Insufficient documentation

## 2020-01-10 DIAGNOSIS — I2510 Atherosclerotic heart disease of native coronary artery without angina pectoris: Secondary | ICD-10-CM | POA: Diagnosis not present

## 2020-01-10 DIAGNOSIS — Z9010 Acquired absence of unspecified breast and nipple: Secondary | ICD-10-CM | POA: Insufficient documentation

## 2020-01-10 DIAGNOSIS — K2970 Gastritis, unspecified, without bleeding: Secondary | ICD-10-CM | POA: Diagnosis not present

## 2020-01-10 DIAGNOSIS — K2100 Gastro-esophageal reflux disease with esophagitis, without bleeding: Secondary | ICD-10-CM | POA: Insufficient documentation

## 2020-01-10 DIAGNOSIS — K222 Esophageal obstruction: Secondary | ICD-10-CM | POA: Insufficient documentation

## 2020-01-10 DIAGNOSIS — D509 Iron deficiency anemia, unspecified: Secondary | ICD-10-CM | POA: Diagnosis not present

## 2020-01-10 DIAGNOSIS — Z888 Allergy status to other drugs, medicaments and biological substances status: Secondary | ICD-10-CM | POA: Insufficient documentation

## 2020-01-10 DIAGNOSIS — Z9081 Acquired absence of spleen: Secondary | ICD-10-CM | POA: Insufficient documentation

## 2020-01-10 DIAGNOSIS — N189 Chronic kidney disease, unspecified: Secondary | ICD-10-CM | POA: Insufficient documentation

## 2020-01-10 DIAGNOSIS — K319 Disease of stomach and duodenum, unspecified: Secondary | ICD-10-CM

## 2020-01-10 HISTORY — PX: SUBMUCOSAL LIFTING INJECTION: SHX6855

## 2020-01-10 HISTORY — PX: ESOPHAGOGASTRODUODENOSCOPY (EGD) WITH PROPOFOL: SHX5813

## 2020-01-10 HISTORY — DX: Dyspnea, unspecified: R06.00

## 2020-01-10 HISTORY — PX: HEMOSTASIS CLIP PLACEMENT: SHX6857

## 2020-01-10 HISTORY — PX: ENDOSCOPIC MUCOSAL RESECTION: SHX6839

## 2020-01-10 HISTORY — DX: Essential (primary) hypertension: I10

## 2020-01-10 SURGERY — ESOPHAGOGASTRODUODENOSCOPY (EGD) WITH PROPOFOL
Anesthesia: Monitor Anesthesia Care

## 2020-01-10 MED ORDER — OMEPRAZOLE 40 MG PO CPDR: 40.0000 mg | DELAYED_RELEASE_CAPSULE | Freq: Two times a day (BID) | ORAL | 3 refills | Status: DC

## 2020-01-10 MED ORDER — PROPOFOL 10 MG/ML IV BOLUS
INTRAVENOUS | Status: DC | PRN
  Administered 2020-01-10: 30 mg via INTRAVENOUS
  Administered 2020-01-10: 20 mg via INTRAVENOUS

## 2020-01-10 MED ORDER — PROPOFOL 500 MG/50ML IV EMUL
INTRAVENOUS | Status: DC | PRN
  Administered 2020-01-10: 200 ug/kg/min via INTRAVENOUS

## 2020-01-10 MED ORDER — SODIUM CHLORIDE 0.9 % IV SOLN: INTRAVENOUS | Status: DC

## 2020-01-10 MED ORDER — LACTATED RINGERS IV SOLN
INTRAVENOUS | Status: AC | PRN
  Administered 2020-01-10: 1000 mL via INTRAVENOUS

## 2020-01-10 SURGICAL SUPPLY — 15 items

## 2020-01-10 NOTE — Transfer of Care (Signed)
Immediate Anesthesia Transfer of Care Note  Patient: Grace [name]  Procedure(s) Performed: ESOPHAGOGASTRODUODENOSCOPY (EGD) WITH PROPOFOL (N/A ) ENDOSCOPIC MUCOSAL RESECTION SUBMUCOSAL LIFTING INJECTION HEMOSTASIS CLIP PLACEMENT FOREIGN BODY REMOVAL  Patient Location: PACU  Anesthesia Type:MAC  Level of Consciousness: drowsy  Airway & Oxygen Therapy: Patient Spontanous Breathing and Patient connected to nasal cannula oxygen  Post-op Assessment: Report given to RN and Post -op Vital signs reviewed and stable  Post vital signs: Reviewed and stable  Last Vitals:  Vitals Value Taken Time  BP 88/42 01/10/20 0829  Temp    Pulse 82 01/10/20 0831  Resp 22 01/10/20 0831  SpO2 98 % 01/10/20 0831  Vitals shown include unvalidated device data.  Last Pain:  Vitals:   01/10/20 0652  TempSrc: Oral  PainSc: 0-No pain         Complications: No complications documented.

## 2020-01-10 NOTE — Anesthesia Postprocedure Evaluation (Signed)
Anesthesia Post Note  Patient: Grace [name]  Procedure(s) Performed: ESOPHAGOGASTRODUODENOSCOPY (EGD) WITH PROPOFOL (N/A ) ENDOSCOPIC MUCOSAL RESECTION SUBMUCOSAL LIFTING INJECTION HEMOSTASIS CLIP PLACEMENT FOREIGN BODY REMOVAL     Patient location during evaluation: Endoscopy Anesthesia Type: MAC Level of consciousness: awake and alert Pain management: pain level controlled Vital Signs Assessment: post-procedure vital signs reviewed and stable Respiratory status: spontaneous breathing, nonlabored ventilation and respiratory function stable Cardiovascular status: blood pressure returned to baseline and stable Postop Assessment: no apparent nausea or vomiting Anesthetic complications: no   No complications documented.  Last Vitals:  Vitals:   01/10/20 0845 01/10/20 0850  BP: 102/64 108/61  Pulse: 81 82  Resp: 20 16  Temp:    SpO2: 100% 100%    Last Pain:  Vitals:   01/10/20 0850  TempSrc:   PainSc: 0-No pain                 Lynda Rainwater

## 2020-01-10 NOTE — Discharge Instructions (Signed)
Lovenox to be restarted at 900PM on 8/16. May restart Coumadin on 8/18. Increasing Omeprazole to 40 mg twice daily for 1 month and then 40 mg daily for 1 month then back to 20 mg daily.  YOU HAD AN ENDOSCOPIC PROCEDURE TODAY: Refer to the procedure report and other information in the discharge instructions given to you for any specific questions about what was found during the examination. If this information does not answer your questions, please call [place] office at [phone] to clarify.   YOU SHOULD EXPECT: Some feelings of bloating in the abdomen. Passage of more gas than usual. Walking can help get rid of the air that was put into your GI tract during the procedure and reduce the bloating. If you had a lower endoscopy (such as a colonoscopy or flexible sigmoidoscopy) you may notice spotting of blood in your stool or on the toilet paper. Some abdominal soreness may be present for a day or two, also.  DIET: Your first meal following the procedure should be a light meal and then it is ok to progress to your normal diet. A half-sandwich or bowl of soup is an example of a good first meal. Heavy or fried foods are harder to digest and may make you feel nauseous or bloated. Drink plenty of fluids but you should avoid alcoholic beverages for 24 hours. If you had a esophageal dilation, please see attached instructions for diet.    ACTIVITY: Your care partner should take you home directly after the procedure. You should plan to take it easy, moving slowly for the rest of the day. You can resume normal activity the day after the procedure however YOU SHOULD NOT DRIVE, use power tools, machinery or perform tasks that involve climbing or major physical exertion for 24 hours (because of the sedation medicines used during the test).   SYMPTOMS TO REPORT IMMEDIATELY: A gastroenterologist can be reached at any hour. Please call [phone]  for any of the following symptoms:  Following upper endoscopy (EGD,  EUS, ERCP, esophageal dilation) Vomiting of blood or coffee ground material  New, significant abdominal pain  New, significant chest pain or pain under the shoulder blades  Painful or persistently difficult swallowing  New shortness of breath  Black, tarry-looking or red, bloody stools  FOLLOW UP:  If any biopsies were taken you will be contacted by phone or by letter within the next 1-3 weeks. Call [phone]  if you have not heard about the biopsies in 3 weeks.  Please also call with any specific questions about appointments or follow up tests.

## 2020-01-10 NOTE — Anesthesia Preprocedure Evaluation (Signed)
Anesthesia Evaluation  Patient identified by MRN, date of birth, ID band Patient awake    Reviewed: Allergy & Precautions, NPO status , Patient's Chart, lab work & pertinent test results  Airway Mallampati: II  TM Distance: >3 FB Neck ROM: Full    Dental no notable dental hx. (+) Teeth Intact, Dental Advisory Given   Pulmonary neg pulmonary ROS,    Pulmonary exam normal breath sounds clear to auscultation       Cardiovascular hypertension, + CAD and +CHF  Normal cardiovascular exam+ dysrhythmias (WPW) Atrial Fibrillation + Valvular Problems/Murmurs (MV replacement and AVR 2008)  Rhythm:Regular Rate:Normal  HLD  TTE 12/2018 EF 50% NORMAL RIGHT VENTRICULAR SYSTOLIC FUNCTION VALVULAR REGURGITATION: TRIVIAL AR, MILD TR PROSTHETIC VALVE(S): MECH PROSTHETIC AoV, MECH PROSTHETIC MV VERY POOR SOUND TRANSMISSION DESPITE THE USE OF IMAGING AGENT NEAR NORMAL LV FUNCTION RV ENLARGED BUT WITH NORMAL FUNCTION  Holter Monitor 2019  Conclusions: 1) This 48 hour Holter scan was adequate for interpretation 2) The patient was in sinus rhythm, sinus bradycardia, sinus tachycardia sinus arrhythmia (strips 7, 11). 3) Ventricular ectopic activity consisted of multifocal PVCs (strips 5, 9),  couplets (strips 6, 10). 4) Supraventricular ectopic activity consisted of PACs (strips 8, 11), rare nonsustained ectopic actrial rhythm. The longest and fastest run was 13 beats, 83 BPM at 4:43 PM (strip 12). 5) There were no pauses greater than 2.0  seconds noted. 6) There were no symptoms noted in patient diary, nor was the event button activated.   Neuro/Psych  Headaches, CVA negative psych ROS   GI/Hepatic negative GI ROS, Neg liver ROS,   Endo/Other  Hypothyroidism   Renal/GU Renal InsufficiencyRenal disease  negative genitourinary   Musculoskeletal negative musculoskeletal ROS (+)   Abdominal   Peds  Hematology  (+) Blood dyscrasia (on  coumadin), anemia ,   Anesthesia Other Findings   Reproductive/Obstetrics                             Anesthesia Physical  Anesthesia Plan  ASA: III  Anesthesia Plan: MAC   Post-op Pain Management:    Induction: Intravenous  PONV Risk Score and Plan: 2 and Propofol infusion, Treatment may vary due to age or medical condition and Ondansetron  Airway Management Planned: Natural Airway  Additional Equipment:   Intra-op Plan:   Post-operative Plan:   Informed Consent: I have reviewed the patients History and Physical, chart, labs and discussed the procedure including the risks, benefits and alternatives for the proposed anesthesia with the patient or authorized representative who has indicated his/her understanding and acceptance.     Dental advisory given  Plan Discussed with: CRNA  Anesthesia Plan Comments:         Anesthesia Quick Evaluation

## 2020-01-10 NOTE — H&P (Signed)
GASTROENTEROLOGY PROCEDURE H&P NOTE   Primary Care Physician: Marin Olp, MD  HPI: Grace [name] is a 69 y.o. female who presents for EGD for evaluation of abnormal VCE suggesting a polypoid lesion that is bleeding in the stomach.  Has history of IDA.  Past Medical History:  Diagnosis Date  . Anemia   . Aortic insufficiency   . AORTIC VALVE REPLACEMENT, HX OF 04/27/2007   Qualifier: Diagnosis of  By: Leanne Chang MD, Bruce    . Cancer [facility])    left breast- surgery and chemo  . CHF (congestive heart failure) ([place])   . Chronic kidney disease    Stage 3  . Congestive heart failure ([place]) 08/03/2009   [facility] Dr. Paul Half. Last echo 2010 with EF 30%. Restrictive due to radiation. AVR MVR. 02/2014 visit planned.    . Coronary artery disease    11/06/06 [facility]): 40% oLM, 60% dLAD, 30% oRCA  . Dyspnea    with exertion  . Dysrhythmia    paroxsymal a-flutter 2018; intermittent left BBB (2016)  . Edema of both feet   . GI bleed   . Heart murmur    mitral and aortic valve replacement  . Hodgkin's disease    ~1974, s/p [place]le field radiation and chemo  . Hyperlipidemia   . Hypertension   . Hypothyroidism   . Migraine    none since 2008  . MITRAL VALVE REPLACEMENT, HX OF 04/27/2007   Qualifier: Diagnosis of  By: Leanne Chang MD, Bruce    . Pericarditis    s/p pericardidal stripping through lateral thoracotomy 01/2007 [facility])  . Stroke [facility]) 2008   No residual effects  . TIA (transient ischemic attack)   . Tricuspid regurgitation   . Varicose veins of both lower extremities    left worse than right- have gotten larger in April 2020  . WPW (Wolff-Parkinson-White syndrome)    Past Surgical History:  Procedure Laterality Date  . AORTIC VALVE SURGERY     19 mm mechanical [employer]ent AVR 05/2006 [facility], Dr. Lajuana Matte)  . bilateral breast implants    . BIOPSY  02/22/2019   Procedure: BIOPSY;  Surgeon: Rush Landmark Telford Nab., MD;  Location: [place];  Service: Gastroenterology;;  .  COLONOSCOPY WITH PROPOFOL N/A 02/22/2019   Procedure: COLONOSCOPY WITH PROPOFOL;  Surgeon: Irving Copas., MD;  Location: [place];  Service: Gastroenterology;  Laterality: N/A;  . ENDOMETRIAL ABLATION    . ENTEROSCOPY N/A 02/22/2019   Procedure: ENTEROSCOPY;  Surgeon: Mansouraty, Telford Nab., MD;  Location: [place];  Service: Gastroenterology;  Laterality: N/A;  . EXPLORATORY LAPAROTOMY    . HEMOSTASIS CLIP PLACEMENT  02/22/2019   Procedure: HEMOSTASIS CLIP PLACEMENT;  Surgeon: Irving Copas., MD;  Location: [place];  Service: Gastroenterology;;  . KYPHOPLASTY N/A 12/10/2018   Procedure: KYPHOPLASTY THORACIC SIX- THORACIC SEVEN;  Surgeon: Newman Pies, MD;  Location: [place];  Service: Neurosurgery;  Laterality: N/A;  KYPHOPLASTY THORACIC SIX- THORACIC SEVEN  . MASTECTOMY    . MITRAL VALVE REPLACEMENT     25 mm St[employer] Jude MVR, 05/2006, Dr. Lajuana Matte, [facility]  . pericardial stripping  9/08  . POLYPECTOMY  02/22/2019   Procedure: POLYPECTOMY;  Surgeon: Mansouraty, Telford Nab., MD;  Location: [place];  Service: Gastroenterology;;  . SPLENECTOMY     ~ 1974  . SPLENECTOMY, TOTAL    . SUBMUCOSAL TATTOO INJECTION  02/22/2019   Procedure: SUBMUCOSAL TATTOO INJECTION;  Surgeon: Irving Copas., MD;  Location: [place];  Service: Gastroenterology;;  .  THORACOTOMY     for pericardial stripping 01/2007 (Dr. Elenor Quinones, [facility])  . TONSILLECTOMY    . valvulopathy  06/13/06   Current Facility-Administered Medications  Medication Dose Route Frequency Provider Last Rate Last Admin  . 0.9 %  sodium chloride infusion   Intravenous Continuous Mansouraty, Telford Nab., MD      . lactated ringers infusion    Continuous PRN Mansouraty, Telford Nab., MD 50 mL/hr at 01/10/20 0706 1,000 mL at 01/10/20 0706   Allergies  Allergen Reactions  . Digoxin Other (See Comments)    bradycardia  . Statins     myalgia   Family History  Problem Relation Age of Onset  . Heart  disease Mother   . Cancer Mother        breast  . Other Father        hunting gun accident  . Heart disease Maternal Aunt   . Cancer Maternal Aunt        uterine  . Heart disease Maternal Grandmother   . Diabetes Maternal Grandmother   . Hypercalcemia Neg Hx   . Colon cancer Neg Hx   . Esophageal cancer Neg Hx   . Liver disease Neg Hx   . Pancreatic cancer Neg Hx   . Rectal cancer Neg Hx   . Stomach cancer Neg Hx    Social History   Socioeconomic History  . Marital status: Married    Spouse name: Not on file  . Number of children: 0  . Years of education: Not on file  . Highest education level: Not on file  Occupational History  . Occupation: retired  Tobacco Use  . Smoking status: Never Smoker  . Smokeless tobacco: Never Used  Vaping Use  . Vaping Use: Never used  Substance and Sexual Activity  . Alcohol use: No  . Drug use: No  . Sexual activity: Not on file  Other Topics Concern  . Not on file  Social History Narrative   Lived in [place] for 18 years. Moved here for the weather.    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      Husband retired January 2015. Married 1991 (2nd marriage). No kids.       Hobbies: walks 2 miles per day, read, work outside         [employer] broadcast assistant Strain:   . Difficulty of Paying Living Expenses:   Food Insecurity:   . Worried About Charity fundraiser in the Last Year:   . Arboriculturist in the Last Year:   Transportation Needs:   . Film/video editor (Medical):   Marland Kitchen Lack of Transportation (Non-Medical):   Physical Activity:   . Days of Exercise per Week:   . Minutes of Exercise per Session:   Stress:   . Feeling of Stress :   Social Connections:   . Frequency of Communication with Friends and Family:   . Frequency of Social Gatherings with Friends and Family:   . Attends Religious Services:   . Active Member of Clubs  or Organizations:   . Attends Archivist Meetings:   Marland Kitchen Marital Status:   Intimate Partner Violence:   . Fear of Current or Ex-Partner:   . Emotionally Abused:   Marland Kitchen Physically Abused:   . Sexually Abused:     Physical Exam: Vital signs in last 24 hours: Temp:  [98.3 F (36.8  C)] 98.3 F (36.8 C) (08/16 0652) Resp:  [17] 17 (08/16 0652) BP: (141)/(78) 141/78 (08/16 0652) SpO2:  [99 %] 99 % (08/16 0652) Weight:  [57.6 kg] 57.6 kg (08/16 0652)   GEN: NAD EYE: Sclerae anicteric ENT: MMM CV: Non-tachycardic GI: Soft, NT/ND NEURO:  Alert & Oriented x 3  Lab Results: No results for input(s): WBC, HGB, HCT, PLT in the last 72 hours. BMET No results for input(s): NA, K, CL, CO2, GLUCOSE, BUN, CREATININE, CALCIUM in the last 72 hours. LFT No results for input(s): PROT, ALBUMIN, AST, ALT, ALKPHOS, BILITOT, BILIDIR, IBILI in the last 72 hours. PT/INR No results for input(s): LABPROT, INR in the last 72 hours.   Impression / Plan: This is a 69 y.o.female who presents for EGD for evaluation of abnormal VCE suggesting a polypoid lesion that is bleeding in the stomach.  Has history of IDA.  The risks and benefits of endoscopic evaluation were discussed with the patient; these include but are not limited to the risk of perforation, infection, bleeding, missed lesions, lack of diagnosis, severe illness requiring hospitalization, as well as anesthesia and sedation related illnesses.  The patient is agreeable to proceed.    Justice Britain, MD [facility] Office # [phone]

## 2020-01-10 NOTE — Op Note (Signed)
[facility] Patient Name: Grace [name] Procedure Date : 01/10/2020 MRN: [id number] Attending MD: Justice Britain , MD Date of Birth: April 09, 1951 CSN: [id number] Age: 69 Admit Type: Outpatient Procedure:                Upper GI endoscopy Indications:              Iron deficiency anemia - previously negative                            SBE/Colonoscopy, Abnormal video capsule endoscopy                            (new lesion noted in stomach) Providers:                Justice Britain, MD, Carlyn Reichert, RN, Cletis Athens, Technician, Sharyn Dross CRNA Referring MD:             [name]. [name] MD, Minus Breeding, MD Medicines:                Monitored Anesthesia Care Complications:            No immediate complications. Estimated Blood Loss:     Estimated blood loss: none. Procedure:                Pre-Anesthesia Assessment:                           - Prior to the procedure, a History and Physical                            was performed, and patient medications and                            allergies were reviewed. The patient's tolerance of                            previous anesthesia was also reviewed. The risks                            and benefits of the procedure and the sedation                            options and risks were discussed with the patient.                            All questions were answered, and informed consent                            was obtained. Prior Anticoagulants: The patient                            last took Coumadin (warfarin) 5 days and Lovenox                            (  enoxaparin) 1 day prior to the procedure. ASA                            Grade Assessment: III - A patient with severe                            systemic disease. After reviewing the risks and                            benefits, the patient was deemed in satisfactory                            condition to undergo the  procedure.                           After obtaining informed consent, the endoscope was                            passed under direct vision. Throughout the                            procedure, the patient's blood pressure, pulse, and                            oxygen saturations were monitored continuously. The                            GIF-H190 ([id number]) Olympus gastroscope was                            introduced through the mouth, and advanced to the                            second part of duodenum. The upper GI endoscopy was                            accomplished without difficulty. The patient                            tolerated the procedure. Scope In: Scope Out: Findings:      No gross lesions were noted in the proximal esophagus, in the mid       esophagus and in the distal esophagus.      LA Grade B (one or more mucosal breaks greater than 5 mm, not extending       between the tops of two mucosal folds) esophagitis with no bleeding was       found at the gastroesophageal junction.      A widely patent and non-obstructing Schatzki ring was found at the       gastroesophageal junction.      A single 12 mm semi-sessile polyp with bleeding and stigmata of recent       bleeding was found in the gastric antrum. Preparations were made for       mucosal resection. NBI and White-light was done to demarcate the borders  of the lesion. Orise gel was injected to raise the lesion. Snare mucosal       resection was performed. Resection and retrieval were complete. To close       a defect after mucosal resection, five hemostatic clips were       successfully placed (MR conditional). There was no bleeding at the end       of the procedure.      No other gross lesions were noted in the entire examined stomach.      No gross lesions were noted in the duodenal bulb, in the first portion       of the duodenum and in the second portion of the duodenum. Impression:               - No  gross lesions in esophagus.                           - LA Grade B reflux esophagitis with no bleeding.                           - Widely patent and non-obstructing Schatzki ring.                           - A single gastric polyp. Resected and retrieved.                            Clips (MR conditional) were placed to decrease risk                            of bleeding in setting of need for anticoagulation.                           - No other gross lesions in the stomach.                           - No gross lesions in the duodenal bulb, in the                            first portion of the duodenum and in the second                            portion of the duodenum. Recommendation:           - The patient will be observed post-procedure,                            until all discharge criteria are met.                           - Discharge patient to home.                           - Patient has a contact number available for                            emergencies. The signs and symptoms  of potential                            delayed complications were discussed with the                            patient. Return to normal activities tomorrow.                            Written discharge instructions were provided to the                            patient.                           - Discharge patient to home.                           - Resume previous diet.                           - Await pathology results.                           - The patient will be at risk of bleeding                            post-polypectomy, but with removal via snare                            resection, hopefully we can mitigate this as much                            as possible. Due to her underlying valvular issues,                            will proceed with Lovenox being restart tonight (at                            least 12                           hours after completion of today's  procedure) and                            then Coumadin can be restarted in 48 hours (I would                            normally wait 72 hours before any anticoagulation,                            but due to her risk of valvular issues and clot                            formation we will take this chance).                           -  We will send message to the [place] Coumadin RN                            to update.                           - Increase Omeprazole to 40 mg twice daily for 1                            month then 40 mg daily for 1 month and then back                            down to 20 mg once daily.                           - Observe patient's clinical course.                           - Repeat CBC/Iron/TIBC/Ferritin in 6-8 weeks.                           - The findings and recommendations were discussed                            with the patient.                           - The findings and recommendations were discussed                            with the patient's family. Procedure Code(s):        --- Professional ---                           [id number], Esophagogastroduodenoscopy, flexible,                            transoral; with endoscopic mucosal resection Diagnosis Code(s):        --- Professional ---                           K21.00, Gastro-esophageal reflux disease with                            esophagitis, without bleeding                           K22.2, Esophageal obstruction                           K31.7, Polyp of stomach and duodenum                           D50.9, Iron deficiency anemia, unspecified                           R93.3,  Abnormal findings on diagnostic imaging of                            other parts of digestive tract CPT copyright 2019 American Medical Association. All rights reserved. The codes documented in this report are preliminary and upon coder review may  be revised to meet current compliance requirements. Justice Britain, MD 01/10/2020 9:42:53 AM Number of Addenda: 0

## 2020-01-10 NOTE — Telephone Encounter (Signed)
Adella Hare, RN followed up with patient today to reiterate instructions for coumadin and Lovenox according to GI.  Patient verbalized understanding and thanked me for the call.

## 2020-01-10 NOTE — Anesthesia Procedure Notes (Signed)
Procedure Name: MAC Date/Time: 01/10/2020 8:06 AM Performed by: Imagene Riches, CRNA Pre-anesthesia Checklist: Patient identified, Emergency Drugs available, Patient being monitored, Suction available and Timeout performed Patient Re-evaluated:Patient Re-evaluated prior to induction Oxygen Delivery Method: Nasal cannula

## 2020-01-11 ENCOUNTER — Encounter (HOSPITAL_COMMUNITY): Payer: Self-pay | Admitting: Gastroenterology

## 2020-01-11 LAB — SURGICAL PATHOLOGY

## 2020-01-16 ENCOUNTER — Encounter: Payer: Self-pay | Admitting: Gastroenterology

## 2020-01-18 ENCOUNTER — Ambulatory Visit: Payer: Medicare PPO | Admitting: General Practice

## 2020-01-18 ENCOUNTER — Other Ambulatory Visit: Payer: Self-pay

## 2020-01-18 DIAGNOSIS — Z7901 Long term (current) use of anticoagulants: Secondary | ICD-10-CM | POA: Diagnosis not present

## 2020-01-18 LAB — POCT INR: INR: 2.5 (ref 2.0–3.0)

## 2020-01-18 NOTE — Patient Instructions (Addendum)
Pre visit review using our clinic review tool, if applicable. No additional management support is needed unless otherwise documented below in the visit note.  Continue to take 1 tablet daily except 1/2 tablet on Mon Wed and Fridays.  Re-check in in 6 weeks.    

## 2020-01-19 ENCOUNTER — Other Ambulatory Visit: Payer: Self-pay

## 2020-01-19 DIAGNOSIS — D509 Iron deficiency anemia, unspecified: Secondary | ICD-10-CM

## 2020-01-19 NOTE — Progress Notes (Signed)
Medical screening examination/treatment/procedure(s) were performed by non-physician practitioner and as supervising physician I was immediately available for consultation/collaboration. I agree with above. Franchot Pollitt, MD   

## 2020-01-25 ENCOUNTER — Ambulatory Visit: Payer: Medicare PPO

## 2020-02-29 ENCOUNTER — Ambulatory Visit (INDEPENDENT_AMBULATORY_CARE_PROVIDER_SITE_OTHER): Payer: Medicare PPO | Admitting: General Practice

## 2020-02-29 ENCOUNTER — Other Ambulatory Visit: Payer: Self-pay

## 2020-02-29 DIAGNOSIS — Z7901 Long term (current) use of anticoagulants: Secondary | ICD-10-CM

## 2020-02-29 DIAGNOSIS — Z952 Presence of prosthetic heart valve: Secondary | ICD-10-CM | POA: Diagnosis not present

## 2020-02-29 LAB — POCT INR: INR: 3.4 — AB (ref 2.0–3.0)

## 2020-02-29 NOTE — Patient Instructions (Addendum)
Pre visit review using our clinic review tool, if applicable. No additional management support is needed unless otherwise documented below in the visit note.  Take 1/2 tablet today and then continue to take 1 tablet daily except 1/2 tablet on Mon Wed and Fridays.  Re-check in in 6 weeks.

## 2020-02-29 NOTE — Progress Notes (Signed)
Medical screening examination/treatment/procedure(s) were performed by non-physician practitioner and as supervising physician I was immediately available for consultation/collaboration. I agree with above. Fransisco Messmer, MD   

## 2020-03-01 ENCOUNTER — Telehealth: Payer: Self-pay

## 2020-03-01 NOTE — Telephone Encounter (Signed)
-----   Message from Yevette Edwards, RN sent at 01/19/2020 10:43 AM EDT ----- Regarding: Labs CBC/IBC + Ferritin, order in epic

## 2020-03-01 NOTE — Telephone Encounter (Signed)
Spoke with patient to remind her that she is due for repeat labs. Advised patient that she can go by the lab at her convenience between 7:30 am-5 pm, Monday through Friday.  Pt states that she will try to get by early next week.

## 2020-03-03 ENCOUNTER — Other Ambulatory Visit (INDEPENDENT_AMBULATORY_CARE_PROVIDER_SITE_OTHER): Payer: Medicare PPO

## 2020-03-03 DIAGNOSIS — D509 Iron deficiency anemia, unspecified: Secondary | ICD-10-CM | POA: Diagnosis not present

## 2020-03-03 LAB — CBC
HCT: 37.5 % (ref 36.0–46.0)
Hemoglobin: 12.3 g/dL (ref 12.0–15.0)
MCHC: 32.8 g/dL (ref 30.0–36.0)
MCV: 93.5 fl (ref 78.0–100.0)
Platelets: 422 10*3/uL — ABNORMAL HIGH (ref 150.0–400.0)
RBC: 4.01 Mil/uL (ref 3.87–5.11)
RDW: 14.9 % (ref 11.5–15.5)
WBC: 10.4 10*3/uL (ref 4.0–10.5)

## 2020-03-03 LAB — IBC + FERRITIN
Ferritin: 78.5 ng/mL (ref 10.0–291.0)
Iron: 52 ug/dL (ref 42–145)
Saturation Ratios: 11.9 % — ABNORMAL LOW (ref 20.0–50.0)
Transferrin: 313 mg/dL (ref 212.0–360.0)

## 2020-03-06 ENCOUNTER — Other Ambulatory Visit: Payer: Self-pay

## 2020-03-06 DIAGNOSIS — D509 Iron deficiency anemia, unspecified: Secondary | ICD-10-CM

## 2020-03-13 DIAGNOSIS — Z85828 Personal history of other malignant neoplasm of skin: Secondary | ICD-10-CM | POA: Diagnosis not present

## 2020-03-13 DIAGNOSIS — L814 Other melanin hyperpigmentation: Secondary | ICD-10-CM | POA: Diagnosis not present

## 2020-03-13 DIAGNOSIS — C44622 Squamous cell carcinoma of skin of right upper limb, including shoulder: Secondary | ICD-10-CM | POA: Diagnosis not present

## 2020-03-13 DIAGNOSIS — L570 Actinic keratosis: Secondary | ICD-10-CM | POA: Diagnosis not present

## 2020-03-13 DIAGNOSIS — D485 Neoplasm of uncertain behavior of skin: Secondary | ICD-10-CM | POA: Diagnosis not present

## 2020-03-13 DIAGNOSIS — L905 Scar conditions and fibrosis of skin: Secondary | ICD-10-CM | POA: Diagnosis not present

## 2020-03-13 DIAGNOSIS — D229 Melanocytic nevi, unspecified: Secondary | ICD-10-CM | POA: Diagnosis not present

## 2020-03-13 DIAGNOSIS — B078 Other viral warts: Secondary | ICD-10-CM | POA: Diagnosis not present

## 2020-03-13 DIAGNOSIS — L821 Other seborrheic keratosis: Secondary | ICD-10-CM | POA: Diagnosis not present

## 2020-03-13 DIAGNOSIS — C44619 Basal cell carcinoma of skin of left upper limb, including shoulder: Secondary | ICD-10-CM | POA: Diagnosis not present

## 2020-03-23 ENCOUNTER — Ambulatory Visit: Payer: Medicare PPO | Admitting: Gastroenterology

## 2020-03-27 ENCOUNTER — Encounter: Payer: Self-pay | Admitting: Family Medicine

## 2020-03-27 DIAGNOSIS — Z1231 Encounter for screening mammogram for malignant neoplasm of breast: Secondary | ICD-10-CM | POA: Diagnosis not present

## 2020-03-28 ENCOUNTER — Encounter: Payer: Self-pay | Admitting: Internal Medicine

## 2020-04-03 ENCOUNTER — Other Ambulatory Visit: Payer: Self-pay

## 2020-04-03 ENCOUNTER — Encounter: Payer: Self-pay | Admitting: Family Medicine

## 2020-04-03 ENCOUNTER — Ambulatory Visit (INDEPENDENT_AMBULATORY_CARE_PROVIDER_SITE_OTHER): Payer: Medicare PPO | Admitting: Family Medicine

## 2020-04-03 VITALS — BP 122/72 | HR 66 | Temp 98.1°F | Resp 18 | Ht 63.5 in | Wt 128.4 lb

## 2020-04-03 DIAGNOSIS — I483 Typical atrial flutter: Secondary | ICD-10-CM

## 2020-04-03 DIAGNOSIS — D508 Other iron deficiency anemias: Secondary | ICD-10-CM

## 2020-04-03 DIAGNOSIS — M810 Age-related osteoporosis without current pathological fracture: Secondary | ICD-10-CM

## 2020-04-03 DIAGNOSIS — I5022 Chronic systolic (congestive) heart failure: Secondary | ICD-10-CM

## 2020-04-03 DIAGNOSIS — Z0000 Encounter for general adult medical examination without abnormal findings: Secondary | ICD-10-CM

## 2020-04-03 DIAGNOSIS — E039 Hypothyroidism, unspecified: Secondary | ICD-10-CM

## 2020-04-03 DIAGNOSIS — I10 Essential (primary) hypertension: Secondary | ICD-10-CM | POA: Diagnosis not present

## 2020-04-03 DIAGNOSIS — E785 Hyperlipidemia, unspecified: Secondary | ICD-10-CM

## 2020-04-03 DIAGNOSIS — Z23 Encounter for immunization: Secondary | ICD-10-CM | POA: Diagnosis not present

## 2020-04-03 DIAGNOSIS — Z952 Presence of prosthetic heart valve: Secondary | ICD-10-CM

## 2020-04-03 DIAGNOSIS — N183 Chronic kidney disease, stage 3 unspecified: Secondary | ICD-10-CM

## 2020-04-03 DIAGNOSIS — I456 Pre-excitation syndrome: Secondary | ICD-10-CM

## 2020-04-03 DIAGNOSIS — Z8571 Personal history of Hodgkin lymphoma: Secondary | ICD-10-CM

## 2020-04-03 NOTE — Patient Instructions (Addendum)
Please stop by lab before you go If you have mychart- we will send your results within 3 business days of [place] receiving them.  If you do not have mychart- we will call you about results within 5 business days of [place] receiving them.  *please note we are currently using [facility] which has a longer processing time than [place] typically so labs may not come back as quickly as in the past *please also note that you will see labs on mychart as soon as they post. I will later go in and write notes on them- will say "notes from Dr. [name]"  Health Maintenance Due  Topic Date Due  . INFLUENZA VACCINE In office flu shot today high-dose 12/26/2019
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
## 2020-04-04 ENCOUNTER — Encounter: Payer: Medicare PPO | Admitting: Family Medicine

## 2020-04-04 DIAGNOSIS — L810 Postinflammatory hyperpigmentation: Secondary | ICD-10-CM | POA: Diagnosis not present

## 2020-04-04 DIAGNOSIS — L905 Scar conditions and fibrosis of skin: Secondary | ICD-10-CM | POA: Diagnosis not present

## 2020-04-04 DIAGNOSIS — C44619 Basal cell carcinoma of skin of left upper limb, including shoulder: Secondary | ICD-10-CM | POA: Diagnosis not present

## 2020-04-04 DIAGNOSIS — C44622 Squamous cell carcinoma of skin of right upper limb, including shoulder: Secondary | ICD-10-CM | POA: Diagnosis not present

## 2020-04-04 LAB — COMPLETE METABOLIC PANEL WITH GFR
AG Ratio: 1.5 (calc) (ref 1.0–2.5)
ALT: 28 U/L (ref 6–29)
AST: 31 U/L (ref 10–35)
Albumin: 4.4 g/dL (ref 3.6–5.1)
Alkaline phosphatase (APISO): 83 U/L (ref 37–153)
BUN/Creatinine Ratio: 21 (calc) (ref 6–22)
BUN: 28 mg/dL — ABNORMAL HIGH (ref 7–25)
CO2: 31 mmol/L (ref 20–32)
Calcium: 10.7 mg/dL — ABNORMAL HIGH (ref 8.6–10.4)
Chloride: 98 mmol/L (ref 98–110)
Creat: 1.32 mg/dL — ABNORMAL HIGH (ref 0.50–0.99)
GFR, Est African American: 48 mL/min/{1.73_m2} — ABNORMAL LOW (ref >=60)
GFR, Est Non African American: 41 mL/min/{1.73_m2} — ABNORMAL LOW (ref >=60)
Globulin: 2.9 g/dL (calc) (ref 1.9–3.7)
Glucose, Bld: 84 mg/dL (ref 65–99)
Potassium: 4.3 mmol/L (ref 3.5–5.3)
Sodium: 140 mmol/L (ref 135–146)
Total Bilirubin: 0.4 mg/dL (ref 0.2–1.2)
Total Protein: 7.3 g/dL (ref 6.1–8.1)

## 2020-04-04 LAB — VITAMIN D 25 HYDROXY (VIT D DEFICIENCY, FRACTURES): Vit D, 25-Hydroxy: 49 ng/mL (ref 30–100)

## 2020-04-04 LAB — TSH: TSH: 0.61 mIU/L (ref 0.40–4.50)

## 2020-04-05 ENCOUNTER — Other Ambulatory Visit: Payer: Self-pay | Admitting: Gastroenterology

## 2020-04-06 ENCOUNTER — Other Ambulatory Visit: Payer: Self-pay

## 2020-04-10 ENCOUNTER — Encounter: Payer: Medicare PPO | Admitting: Family Medicine

## 2020-04-11 ENCOUNTER — Ambulatory Visit: Payer: Medicare PPO | Admitting: General Practice

## 2020-04-11 ENCOUNTER — Other Ambulatory Visit: Payer: Self-pay

## 2020-04-11 DIAGNOSIS — Z7901 Long term (current) use of anticoagulants: Secondary | ICD-10-CM

## 2020-04-11 DIAGNOSIS — Z952 Presence of prosthetic heart valve: Secondary | ICD-10-CM | POA: Diagnosis not present

## 2020-04-11 LAB — POCT INR: INR: 2.5 (ref 2.0–3.0)

## 2020-04-11 NOTE — Progress Notes (Signed)
Medical screening examination/treatment/procedure(s) were performed by non-physician practitioner and as supervising physician I was immediately available for consultation/collaboration. I agree with above. Lillyan Hitson, MD   

## 2020-04-11 NOTE — Patient Instructions (Addendum)
Pre visit review using our clinic review tool, if applicable. No additional management support is needed unless otherwise documented below in the visit note.  Continue to take 1 tablet daily except 1/2 tablet on Mon Wed and Fridays.  Re-check in in 6 weeks.

## 2020-04-13 ENCOUNTER — Other Ambulatory Visit: Payer: Self-pay

## 2020-04-13 ENCOUNTER — Other Ambulatory Visit: Payer: Medicare PPO

## 2020-04-14 ENCOUNTER — Ambulatory Visit (INDEPENDENT_AMBULATORY_CARE_PROVIDER_SITE_OTHER): Payer: Medicare PPO

## 2020-04-14 ENCOUNTER — Telehealth: Payer: Self-pay

## 2020-04-14 DIAGNOSIS — M810 Age-related osteoporosis without current pathological fracture: Secondary | ICD-10-CM | POA: Diagnosis not present

## 2020-04-14 LAB — CALCIUM, IONIZED: Calcium, Ion: 5.1 mg/dL (ref 4.8–5.6)

## 2020-04-14 LAB — PARATHYROID HORMONE, INTACT (NO CA): PTH: 38 pg/mL (ref 14–64)

## 2020-04-14 MED ORDER — DENOSUMAB 60 MG/ML ~~LOC~~ SOSY
60.0000 mg | PREFILLED_SYRINGE | Freq: Once | SUBCUTANEOUS | Status: AC
  Administered 2020-04-14: 60 mg via SUBCUTANEOUS

## 2020-04-14 NOTE — Progress Notes (Signed)
Per orders of Dr. [name], injection of B-12 and Prolia  given by Tobe Sos in left arm. Patient tolerated injection well. Patient will make appointment for 6 month.

## 2020-04-14 NOTE — Telephone Encounter (Signed)
Pt given Prolia today at [facility] approval [id number] and it is good until 05/26/21

## 2020-04-15 ENCOUNTER — Other Ambulatory Visit: Payer: Self-pay | Admitting: Family Medicine

## 2020-04-17 ENCOUNTER — Other Ambulatory Visit: Payer: Self-pay

## 2020-04-17 NOTE — Telephone Encounter (Signed)
Error

## 2020-04-28 ENCOUNTER — Other Ambulatory Visit: Payer: Self-pay

## 2020-04-28 ENCOUNTER — Inpatient Hospital Stay: Payer: Medicare PPO | Attending: Oncology | Admitting: Oncology

## 2020-04-28 ENCOUNTER — Inpatient Hospital Stay: Payer: Medicare PPO

## 2020-04-28 ENCOUNTER — Telehealth: Payer: Self-pay | Admitting: *Deleted

## 2020-04-28 VITALS — BP 139/79 | HR 86 | Temp 97.7°F | Resp 18 | Ht 63.0 in | Wt 130.8 lb

## 2020-04-28 DIAGNOSIS — Z79899 Other long term (current) drug therapy: Secondary | ICD-10-CM | POA: Diagnosis not present

## 2020-04-28 DIAGNOSIS — D649 Anemia, unspecified: Secondary | ICD-10-CM

## 2020-04-28 DIAGNOSIS — C8190 Hodgkin lymphoma, unspecified, unspecified site: Secondary | ICD-10-CM | POA: Insufficient documentation

## 2020-04-28 DIAGNOSIS — Z7901 Long term (current) use of anticoagulants: Secondary | ICD-10-CM | POA: Insufficient documentation

## 2020-04-28 DIAGNOSIS — D509 Iron deficiency anemia, unspecified: Secondary | ICD-10-CM | POA: Diagnosis not present

## 2020-04-28 DIAGNOSIS — Z853 Personal history of malignant neoplasm of breast: Secondary | ICD-10-CM | POA: Insufficient documentation

## 2020-04-28 DIAGNOSIS — Z9221 Personal history of antineoplastic chemotherapy: Secondary | ICD-10-CM | POA: Diagnosis not present

## 2020-04-28 DIAGNOSIS — Z923 Personal history of irradiation: Secondary | ICD-10-CM | POA: Insufficient documentation

## 2020-04-28 LAB — CBC WITH DIFFERENTIAL (CANCER CENTER ONLY)
Abs Immature Granulocytes: 0.07 10*3/uL (ref 0.00–0.07)
Basophils Absolute: 0.1 10*3/uL (ref 0.0–0.1)
Basophils Relative: 1 %
Eosinophils Absolute: 0.3 10*3/uL (ref 0.0–0.5)
Eosinophils Relative: 3 %
HCT: 38.4 % (ref 36.0–46.0)
Hemoglobin: 12 g/dL (ref 12.0–15.0)
Immature Granulocytes: 1 %
Lymphocytes Relative: 6 %
Lymphs Abs: 0.6 10*3/uL — ABNORMAL LOW (ref 0.7–4.0)
MCH: 29.1 pg (ref 26.0–34.0)
MCHC: 31.3 g/dL (ref 30.0–36.0)
MCV: 93 fL (ref 80.0–100.0)
Monocytes Absolute: 1.3 10*3/uL — ABNORMAL HIGH (ref 0.1–1.0)
Monocytes Relative: 13 %
Neutro Abs: 7.9 10*3/uL — ABNORMAL HIGH (ref 1.7–7.7)
Neutrophils Relative %: 76 %
Platelet Count: 408 10*3/uL — ABNORMAL HIGH (ref 150–400)
RBC: 4.13 MIL/uL (ref 3.87–5.11)
RDW: 15.9 % — ABNORMAL HIGH (ref 11.5–15.5)
WBC Count: 10.2 10*3/uL (ref 4.0–10.5)
nRBC: 0 % (ref 0.0–0.2)

## 2020-04-28 LAB — IRON AND TIBC
Iron: 67 ug/dL (ref 41–142)
Saturation Ratios: 17 % — ABNORMAL LOW (ref 21–57)
TIBC: 394 ug/dL (ref 236–444)
UIBC: 327 ug/dL (ref 120–384)

## 2020-04-28 LAB — FERRITIN: Ferritin: 125 ng/mL (ref 11–307)

## 2020-04-28 NOTE — Progress Notes (Signed)
Hematology and Oncology Follow Up Visit  Grace [name] [id number] [birth year] 69 y.o. 04/28/2020 12:33 PM Grace [name], MDHunter, Grace Mars, MD   Principle Diagnosis: 69 year old woman with iron deficiency anemia diagnosed in June 2021.  She was found to have a hemoglobin of 10.2 with decreased iron saturation.   Prior Therapy: She is status post intravenous iron infusion completed in June 2021.    Current therapy: Oral iron replacement therapy currently taking twice a day.  Interim History: Grace [name] returns today for a follow-up visit.  Since the last visit, she completed intravenous iron infusion without any major complications.  Repeat iron studies in October 2021 showed iron level of 52 saturation of 11.9 and ferritin of 78.  Her hemoglobin at that time was 12.3.  Clinically, he reports feeling well except for constipation and dyspepsia associated with oral iron replacement.  He denies any hematochezia, melena or hemoptysis.  She denies any recent hospitalization or illnesses.     Medications: Unchanged on review. Current Outpatient Medications  Medication Sig Dispense Refill  . amoxicillin (AMOXIL) 500 MG capsule Take 2,000 mg by mouth as directed. Take 2000 mg 1 hour prior to dental procedures    . aspirin 81 MG tablet Take 81 mg by mouth daily.      Marland Kitchen aspirin-acetaminophen-caffeine (EXCEDRIN MIGRAINE) 250-250-65 MG tablet Take 2 tablets by mouth every 6 (six) hours as needed for headache.    . cholecalciferol (VITAMIN D3) 25 MCG (1000 UT) tablet Take 1,000 Units by mouth daily.    Marland Kitchen denosumab (PROLIA) 60 MG/ML SOSY injection Inject 60 mg into the skin every 6 (six) months.     . diphenhydramine-acetaminophen (TYLENOL PM) 25-500 MG TABS tablet Take 2 tablets by mouth at bedtime as needed (sleep).     . docusate sodium (COLACE) 100 MG capsule Take 100 mg by mouth daily.     Marland Kitchen ezetimibe (ZETIA) 10 MG tablet Take 10 mg by mouth every evening.     . ferrous sulfate 325 (65 FE)  MG tablet Take 325 mg by mouth 2 (two) times a day.     . furosemide (LASIX) 40 MG tablet TAKE 1 TABLET EVERY DAY 90 tablet 1  . levothyroxine (SYNTHROID) 100 MCG tablet TAKE 1 TABLET EVERY DAY 90 tablet 3  . Magnesium 250 MG TABS Take 250 mg by mouth daily.     . metoprolol (TOPROL-XL) 50 MG 24 hr tablet Take 50 mg by mouth 2 (two) times daily.     Marland Kitchen omeprazole (PRILOSEC) 20 MG capsule Take 1 capsule (20 mg total) by mouth daily. Take 30 minutes before breakfast or dinner. 90 capsule 3  . potassium chloride SA (KLOR-CON) 20 MEQ tablet Take 1 tablet (20 mEq total) by mouth daily. 90 tablet 3  . REPATHA SURECLICK 803 MG/ML SOAJ Inject 140 mg into the skin every 14 (fourteen) days.    Marland Kitchen spironolactone (ALDACTONE) 25 MG tablet Take 25 mg by mouth 2 (two) times daily.     Marland Kitchen warfarin (COUMADIN) 5 MG tablet TAKE 1 TABLET EVERY DAY EXCEPT TAKE 1/2 TABLET ON FRIDAYS AS DIRECTED (Patient taking differently: Take 2.5-5 mg by mouth See admin instructions. Take 2.5 mg in the evening on Mon, Wed, and Fri. Take 5 mg in the evening on Sun, Tues, Thurs, and Sat) 100 tablet 1   No current facility-administered medications for this visit.     Allergies:  Allergies  Allergen Reactions  . Digoxin Other (See Comments)    bradycardia  .  Statins     myalgia      Physical Exam: Blood pressure 139/79, pulse 86, temperature 97.7 F (36.5 C), temperature source Tympanic, resp. rate 18, height 5\' 3"  (1.6 m), weight 130 lb 12.8 oz (59.3 kg), SpO2 99 %.   ECOG:  1   General appearance: Comfortable appearing without any discomfort Head: Normocephalic without any trauma Oropharynx: Mucous membranes are moist and pink without any thrush or ulcers. Eyes: Pupils are equal and round reactive to light. Lymph nodes: No cervical, supraclavicular, inguinal or axillary lymphadenopathy.   Heart:regular rate and rhythm.  S1 and S2 without leg edema. Lung: Clear without any rhonchi or wheezes.  No dullness to  percussion. Abdomin: Soft, nontender, nondistended with good bowel sounds.  No hepatosplenomegaly. Musculoskeletal: No joint deformity or effusion.  Full range of motion noted. Neurological: No deficits noted on motor, sensory and deep tendon reflex exam. Skin: No petechial rash or dryness.  Appeared moist.  .    Lab Results: Lab Results  Component Value Date   WBC 10.4 03/03/2020   HGB 12.3 03/03/2020   HCT 37.5 03/03/2020   MCV 93.5 03/03/2020   PLT 422.0 (H) 03/03/2020     Chemistry      Component Value Date/Time   NA 140 04/03/2020 0856   K 4.3 04/03/2020 0856   CL 98 04/03/2020 0856   CO2 31 04/03/2020 0856   BUN 28 (H) 04/03/2020 0856   CREATININE 1.32 (H) 04/03/2020 0856      Component Value Date/Time   CALCIUM 10.7 (H) 04/03/2020 0856   ALKPHOS 75 10/14/2019 1033   AST 31 04/03/2020 0856   ALT 28 04/03/2020 0856   BILITOT 0.4 04/03/2020 0856       Impression and Plan:   69 year old woman with:  1.    Iron deficiency anemia diagnosed in June 2021.  She was found to have a hemoglobin of 10.2 and decreased iron saturations.  She is status post intravenous iron infusion in June 2021 with normalization of her hemoglobin   The natural course of her disease was reviewed and future treatment options were reiterated.  Continuing oral iron replacement versus repeat intravenous iron were discussed.  We will await iron studies and will communicate these findings to her.  I will likely recommend discontinuation of oral iron supplements given her GI complaints.   Given her exposure to previous chemotherapy and radiation risk of myelodysplastic syndrome is reasonable at this time.  I see no evidence to suggest that based on laboratory testing.  2.  Hodgkin's disease: She received MOPP chemotherapy and mantle field radiation.  No signs or symptoms of disease relapse at this time.  This was diagnosed in 38s.  3.  Breast cancer diagnosed in 1992 currently in remission:  She presented with stage II disease and underwent bilateral mastectomy and reconstruction followed by [facility] chemotherapy.   4.  Follow-up: In 6 months for repeat follow-up.  30  minutes were spent on this encounter.  The time was dedicated to reviewing laboratory data, disease status update and future plan of care discussion.     Zola Button, MD 12/3/202112:33 PM

## 2020-04-28 NOTE — Telephone Encounter (Signed)
Called pt & informed of normal iron level & to stop iron pills per Dr Alen Blew.  She expressed understanding.  She asked about appts & informed of need for lab/MD in 6 mo & to call if she doesn't hear from schedulers before then.

## 2020-04-28 NOTE — Telephone Encounter (Signed)
-----   Message from Wyatt Portela, MD sent at 04/28/2020  2:16 PM EST ----- Please let her know her iron level is back to normal. She can stop iron pills.

## 2020-05-02 ENCOUNTER — Telehealth: Payer: Self-pay | Admitting: *Deleted

## 2020-05-02 NOTE — Telephone Encounter (Signed)
[employer] called regarding prolia. Please advise [phone]

## 2020-05-02 NOTE — Telephone Encounter (Signed)
Insurance called regarding Prolia-  Thank you!

## 2020-05-16 ENCOUNTER — Other Ambulatory Visit: Payer: Self-pay

## 2020-05-16 ENCOUNTER — Ambulatory Visit: Payer: Medicare PPO | Admitting: General Practice

## 2020-05-16 DIAGNOSIS — Z7901 Long term (current) use of anticoagulants: Secondary | ICD-10-CM | POA: Diagnosis not present

## 2020-05-16 DIAGNOSIS — Z952 Presence of prosthetic heart valve: Secondary | ICD-10-CM | POA: Diagnosis not present

## 2020-05-16 LAB — POCT INR: INR: 2.4 (ref 2.0–3.0)

## 2020-05-16 NOTE — Progress Notes (Signed)
Medical screening examination/treatment/procedure(s) were performed by non-physician practitioner and as supervising physician I was immediately available for consultation/collaboration. I agree with above. Dontrel Smethers, MD   

## 2020-05-16 NOTE — Patient Instructions (Addendum)
Pre visit review using our clinic review tool, if applicable. No additional management support is needed unless otherwise documented below in the visit note.  Change dosage and take 1 tablet daily except 1/2 tablet on Mon and Fridays.  Re-check in in 4 weeks.

## 2020-06-05 DIAGNOSIS — Z6823 Body mass index (BMI) 23.0-23.9, adult: Secondary | ICD-10-CM | POA: Diagnosis not present

## 2020-06-05 DIAGNOSIS — Z124 Encounter for screening for malignant neoplasm of cervix: Secondary | ICD-10-CM | POA: Diagnosis not present

## 2020-06-05 DIAGNOSIS — Z853 Personal history of malignant neoplasm of breast: Secondary | ICD-10-CM | POA: Diagnosis not present

## 2020-06-05 DIAGNOSIS — Z01411 Encounter for gynecological examination (general) (routine) with abnormal findings: Secondary | ICD-10-CM | POA: Diagnosis not present

## 2020-06-05 DIAGNOSIS — Z8571 Personal history of Hodgkin lymphoma: Secondary | ICD-10-CM | POA: Diagnosis not present

## 2020-06-05 DIAGNOSIS — Z01419 Encounter for gynecological examination (general) (routine) without abnormal findings: Secondary | ICD-10-CM | POA: Diagnosis not present

## 2020-06-05 DIAGNOSIS — M810 Age-related osteoporosis without current pathological fracture: Secondary | ICD-10-CM | POA: Diagnosis not present

## 2020-06-13 ENCOUNTER — Ambulatory Visit: Payer: Medicare PPO

## 2020-06-13 ENCOUNTER — Other Ambulatory Visit: Payer: Self-pay

## 2020-06-13 ENCOUNTER — Ambulatory Visit: Payer: Medicare PPO | Admitting: General Practice

## 2020-06-13 DIAGNOSIS — Z952 Presence of prosthetic heart valve: Secondary | ICD-10-CM

## 2020-06-13 DIAGNOSIS — Z7901 Long term (current) use of anticoagulants: Secondary | ICD-10-CM | POA: Diagnosis not present

## 2020-06-13 LAB — POCT INR: INR: 2.5 (ref 2.0–3.0)

## 2020-06-13 NOTE — Progress Notes (Signed)
Medical screening examination/treatment/procedure(s) were performed by non-physician practitioner and as supervising physician I was immediately available for consultation/collaboration. I agree with above. Tulani Kidney, MD   

## 2020-06-13 NOTE — Patient Instructions (Addendum)
Pre visit review using our clinic review tool, if applicable. No additional management support is needed unless otherwise documented below in the visit note.  Continue to take 1 tablet daily except 1/2 tablet on Mon and Fridays.  Re-check in in 4 weeks.

## 2020-06-26 ENCOUNTER — Telehealth: Payer: Self-pay

## 2020-06-26 NOTE — Telephone Encounter (Signed)
Patient is calling in stating that she received a bill from [facility] stating they are not going to cover the physical on 04/03/20, and she was fully responsible for the bill and that she had already met the health assessment. In the letter it states she can get an appeal from the [place] office. Did advise patient to contact [employer].

## 2020-06-27 DIAGNOSIS — L905 Scar conditions and fibrosis of skin: Secondary | ICD-10-CM | POA: Diagnosis not present

## 2020-06-27 DIAGNOSIS — Z85828 Personal history of other malignant neoplasm of skin: Secondary | ICD-10-CM | POA: Diagnosis not present

## 2020-06-27 DIAGNOSIS — L821 Other seborrheic keratosis: Secondary | ICD-10-CM | POA: Diagnosis not present

## 2020-06-29 NOTE — Telephone Encounter (Signed)
[employer] medicare did have an issue with their system sending rejections.    Patient does not have a bill on our end.    Insurance has paid.  I have informed patient.

## 2020-07-07 ENCOUNTER — Other Ambulatory Visit: Payer: Self-pay | Admitting: Family Medicine

## 2020-07-10 ENCOUNTER — Other Ambulatory Visit: Payer: Self-pay

## 2020-07-10 MED ORDER — WARFARIN SODIUM 5 MG PO TABS
ORAL_TABLET | ORAL | 1 refills | Status: DC
Start: 2020-07-10 — End: 2021-02-08

## 2020-07-11 ENCOUNTER — Ambulatory Visit: Payer: Medicare PPO

## 2020-07-11 ENCOUNTER — Ambulatory Visit: Payer: Medicare PPO | Admitting: General Practice

## 2020-07-11 ENCOUNTER — Other Ambulatory Visit: Payer: Self-pay

## 2020-07-11 DIAGNOSIS — Z952 Presence of prosthetic heart valve: Secondary | ICD-10-CM

## 2020-07-11 DIAGNOSIS — Z7901 Long term (current) use of anticoagulants: Secondary | ICD-10-CM | POA: Diagnosis not present

## 2020-07-11 LAB — POCT INR: INR: 3 (ref 2.0–3.0)

## 2020-07-11 NOTE — Patient Instructions (Signed)
Pre visit review using our clinic review tool, if applicable. No additional management support is needed unless otherwise documented below in the visit note.  Continue to take 1 tablet daily except 1/2 tablet on Mon and Fridays.  Re-check in in 6 weeks.    

## 2020-07-11 NOTE — Progress Notes (Signed)
Medical screening examination/treatment/procedure(s) were performed by non-physician practitioner and as supervising physician I was immediately available for consultation/collaboration. I agree with above. Jahmai Finelli, MD   

## 2020-07-26 ENCOUNTER — Ambulatory Visit: Payer: Medicare PPO | Admitting: Internal Medicine

## 2020-07-26 ENCOUNTER — Other Ambulatory Visit: Payer: Self-pay

## 2020-07-26 ENCOUNTER — Encounter: Payer: Self-pay | Admitting: Internal Medicine

## 2020-07-26 VITALS — BP 128/82 | HR 90 | Ht 63.0 in | Wt 134.0 lb

## 2020-07-26 DIAGNOSIS — M810 Age-related osteoporosis without current pathological fracture: Secondary | ICD-10-CM

## 2020-07-26 NOTE — Progress Notes (Signed)
Patient ID: Grace [name], female   DOB: 1950-10-05, 70 y.o.   MRN: [id number]   This visit occurred during the SARS-CoV-2 public health emergency.  Safety protocols were in place, including screening questions prior to the visit, additional usage of staff PPE, and extensive cleaning of exam room while observing appropriate contact time as indicated for disinfecting solutions.   HPI  Grace [name] is a 70 y.o.-year-old female, returning for follow-up for osteoporosis (OP) with history of vertebral compression fractures and also hypercalcemia.  Last visit 1 year ago.  Pt was dx with OP in 2014.  Reviewed patient's previous DXA scan reports: Date L1-L2 T score FN T score 33% distal Radius  03/22/2019 ([facility])  -1.6 RFN: -2.7 LFN: -2.4 n/a  03/14/2017  -1.2 (+2.7%) RFN: -2.3 (-2.1%) LFN: -1.5 (+5.7%*) n/a  03/13/2015  -1.4 RFN: -2.2 LFN: -1.9 n/a  02/08/2013  -2.5 RFN: -1.8 LFN: -1.7 n/a   Previous fractures: - sacroiliac insufficiency fracture 06/2018 - from cough-diagnosed by Dr. Gardenia Phlegm - T6 an T7 vertebrae 10/2018 - from picking up a slipper; s/p kyphoplasty 12/10/2018 >> pain almost entirely resolved after the procedure  No recent falls.   No dizziness/orthostasis/poor vision.   Previous OP treatments:  - Boniva 150 mg/month -started 2014 - Prolia -started 04/01/2019 10/01/2019 04/14/2020  No history of vitamin D deficiency: Lab Results  Component Value Date   VD25OH 49 04/03/2020   VD25OH 50.50 07/26/2019   VD25OH 53.58 10/16/2018   VD25OH 58.08 12/03/2017   VD25OH 59.80 10/28/2017   VD25OH 52.49 06/26/2016   VD25OH 67.67 10/16/2015   VD25OH 70.08 01/17/2014   At last visit she was on a multivitamin with calcium, which we stopped.  We changed to vitamin D 1000 units daily.  She continues this today.  No weightbearing exercises.  She was walking on the treadmill and being active in the yard before her fx. before last visit she restarted working her yard.  She was  again inactive over the holidays but restarted working in her yard.  She is not taking high vitamin A doses  Menopause was at 70 y/o.   FH of osteoporosis: mother  -history of vertebral fracture  No history of kidney stones.  She had a history of hypercalcemia resolved after stopping calcium supplements: Component     Latest Ref Rng & Units 04/13/2020  Calcium Ionized     4.8 - 5.6 mg/dL 5.1   Lab Results  Component Value Date   PTH 38 04/13/2020   PTH 37 11/10/2018   PTH 25 10/16/2018   PTH 16 11/25/2017   CALCIUM 10.7 (H) 04/03/2020   CALCIUM 8.9 10/14/2019   CALCIUM 10.5 04/16/2019   CALCIUM 10.5 (H) 01/26/2019   CALCIUM 9.7 12/07/2018   CALCIUM 10.5 (H) 11/10/2018   CALCIUM 10.2 10/16/2018   CALCIUM 10.5 06/30/2018   CALCIUM 9.8 04/28/2018   CALCIUM 10.1 01/02/2018  07/23/2019 ([facility]): Ca 9.1 11/10/2018: Ionized calcium 5.29 (4.8-5.6), for total calcium of 10.5 (8.6-10.4). 11/28/2017: ionized calcium 5.8 (4.8-5.6), or low calcium   No history of thyrotoxicosis.  She actually has hypothyroidism and is on levothyroxine.  Recent TSH was normal: Lab Results  Component Value Date   TSH 0.61 04/03/2020   TSH 2.14 10/14/2019   TSH 0.53 06/25/2019   TSH 0.18 (L) 04/16/2019   TSH 0.55 10/16/2018   + Mild CKD. Last BUN/Cr: Lab Results  Component Value Date   BUN 28 (H) 04/03/2020   CREATININE 1.32 (H) 04/03/2020  07/23/2019: 15/1.1 She had an SPEP that only showed an increased alpha-1 protein, but not an M spike.  Of note, she has a history of Hodgkin lymphoma, s/p ChTx (MOPP -cardiovascular disease, lung fibrosis and hypothyroidism as complications) and RxTx - mantle radiation (Dr. Charlcie Cradle oncologist, Dr. Pilar Plate Batley-radiologist). She had L breast cancer (Dr. Nemiah Commander) >> started Tamoxifen >> increased bleeding >> endometrial ablation >> menopause 70 y/o. She also has a history of CHF (Dr. Venetia Maxon - [facility]), GI bleed (prev. Dr Rush Landmark) >> now seeing  oncology - Dr Alen Blew now.Stopped iron po.   ROS: Constitutional: no weight gain/no weight loss, no fatigue, no subjective hyperthermia, no subjective hypothermia Eyes: no blurry vision, no xerophthalmia ENT: no sore throat, no nodules palpated in neck, no dysphagia, no odynophagia, no hoarseness Cardiovascular: no CP/no SOB/no palpitations/no leg swelling Respiratory: no cough/no SOB/no wheezing Gastrointestinal: no N/no V/no D/no C/no acid reflux Musculoskeletal: no muscle aches/+_ joint aches Skin: no rashes, no hair loss Neurological: no tremors/no numbness/no tingling/no dizziness  I reviewed pt's medications, allergies, PMH, social hx, family hx, and changes were documented in the history of present illness. Otherwise, unchanged from my initial visit note.  Past Medical History:  Diagnosis Date  . Anemia   . Aortic insufficiency   . AORTIC VALVE REPLACEMENT, HX OF 04/27/2007   Qualifier: Diagnosis of  By: Leanne Chang MD, Bruce    . Cancer [facility])    left breast- surgery and chemo  . CHF (congestive heart failure) ([place])   . Chronic kidney disease    Stage 3  . Congestive heart failure ([place]) 08/03/2009   [facility] Dr. Paul Half. Last echo 2010 with EF 30%. Restrictive due to radiation. AVR MVR. 02/2014 visit planned.    . Coronary artery disease    11/06/06 [facility]): 40% oLM, 60% dLAD, 30% oRCA  . Dyspnea    with exertion  . Dysrhythmia    paroxsymal a-flutter 2018; intermittent left BBB (2016)  . Edema of both feet   . GI bleed   . Heart murmur    mitral and aortic valve replacement  . Hodgkin's disease    ~1974, s/p Mantle field radiation and chemo  . Hyperlipidemia   . Hypertension   . Hypothyroidism   . Migraine    none since 2008  . MITRAL VALVE REPLACEMENT, HX OF 04/27/2007   Qualifier: Diagnosis of  By: Leanne Chang MD, Bruce    . Pericarditis    s/p pericardidal stripping through lateral thoracotomy 01/2007 [facility])  . Stroke [facility]) 2008   No residual effects  . TIA (transient  ischemic attack)   . Tricuspid regurgitation   . Varicose veins of both lower extremities    left worse than right- have gotten larger in April 2020  . WPW (Wolff-Parkinson-White syndrome)    Past Surgical History:  Procedure Laterality Date  . AORTIC VALVE SURGERY     19 mm mechanical [facility]ent AVR 05/2006 [facility], Dr. Lajuana Matte)  . bilateral breast implants    . BIOPSY  02/22/2019   Procedure: BIOPSY;  Surgeon: Rush Landmark Telford Nab., MD;  Location: [place];  Service: Gastroenterology;;  . COLONOSCOPY WITH PROPOFOL N/A 02/22/2019   Procedure: COLONOSCOPY WITH PROPOFOL;  Surgeon: Irving Copas., MD;  Location: [place];  Service: Gastroenterology;  Laterality: N/A;  . ENDOMETRIAL ABLATION    . ENDOSCOPIC MUCOSAL RESECTION  01/10/2020   Procedure: ENDOSCOPIC MUCOSAL RESECTION;  Surgeon: Rush Landmark Telford Nab., MD;  Location: [facility] ENDOSCOPY;  Service: Gastroenterology;;  . ENTEROSCOPY  N/A 02/22/2019   Procedure: ENTEROSCOPY;  Surgeon: Rush Landmark Telford Nab., MD;  Location: [place];  Service: Gastroenterology;  Laterality: N/A;  . ESOPHAGOGASTRODUODENOSCOPY (EGD) WITH PROPOFOL N/A 01/10/2020   Procedure: ESOPHAGOGASTRODUODENOSCOPY (EGD) WITH PROPOFOL;  Surgeon: Rush Landmark Telford Nab., MD;  Location: [place];  Service: Gastroenterology;  Laterality: N/A;  . EXPLORATORY LAPAROTOMY    . HEMOSTASIS CLIP PLACEMENT  02/22/2019   Procedure: HEMOSTASIS CLIP PLACEMENT;  Surgeon: Irving Copas., MD;  Location: [place];  Service: Gastroenterology;;  . HEMOSTASIS CLIP PLACEMENT  01/10/2020   Procedure: HEMOSTASIS CLIP PLACEMENT;  Surgeon: Irving Copas., MD;  Location: [place];  Service: Gastroenterology;;  . KYPHOPLASTY N/A 12/10/2018   Procedure: KYPHOPLASTY THORACIC SIX- THORACIC SEVEN;  Surgeon: Newman Pies, MD;  Location: [place];  Service: Neurosurgery;  Laterality: N/A;  KYPHOPLASTY THORACIC SIX- THORACIC SEVEN  . MASTECTOMY    . MITRAL VALVE  REPLACEMENT     25 mm [facility] Jude MVR, 05/2006, Dr. Lajuana Matte, [facility]  . pericardial stripping  9/08  . POLYPECTOMY  02/22/2019   Procedure: POLYPECTOMY;  Surgeon: Mansouraty, Telford Nab., MD;  Location: [place];  Service: Gastroenterology;;  . SPLENECTOMY     ~ 1974  . SPLENECTOMY, TOTAL    . SUBMUCOSAL LIFTING INJECTION  01/10/2020   Procedure: SUBMUCOSAL LIFTING INJECTION;  Surgeon: Rush Landmark Telford Nab., MD;  Location: [place];  Service: Gastroenterology;;  . Lia Foyer TATTOO INJECTION  02/22/2019   Procedure: SUBMUCOSAL TATTOO INJECTION;  Surgeon: Irving Copas., MD;  Location: [place];  Service: Gastroenterology;;  . THORACOTOMY     for pericardial stripping 01/2007 (Dr. Elenor Quinones, [facility])  . TONSILLECTOMY    . valvulopathy  06/13/06   Social History   Socioeconomic History  . Marital status: Married    Spouse name: Not on file  . Number of children: 0  . Years of education: Not on file  . Highest education level: Not on file  Occupational History  .  Retired  Scientific laboratory technician  . Financial resource strain: Not on file  . Food insecurity    Worry: Not on file    Inability: Not on file  . Transportation needs    Medical: Not on file    Non-medical: Not on file  Tobacco Use  . Smoking status: Never Smoker  . Smokeless tobacco: Never Used  Substance and Sexual Activity  . Alcohol use: No  . Drug use: No  . Sexual activity: Not on file  Lifestyle  . Physical activity    Days per week: Not on file    Minutes per session: Not on file  . Stress: Not on file  Relationships  . Social Herbalist on phone: Not on file    Gets together: Not on file    Attends religious service: Not on file    Active member of club or organization: Not on file    Attends meetings of clubs or organizations: Not on file    Relationship status: Not on file  . Intimate partner violence    Fear of current or ex partner: Not on file    Emotionally abused: Not on file     Physically abused: Not on file    Forced sexual activity: Not on file  Other Topics Concern  . Not on file  Social History Narrative   Lived in [place] since 1998. Moved here for the weather.    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 Had to retire  due to cardiac illness.      Husband retired January 2015. Married 1991 (2nd marriage). No kids.       Hobbies: walks 2 miles per day, read, work outside         Current Outpatient Medications on File Prior to Visit  Medication Sig Dispense Refill  . amoxicillin (AMOXIL) 500 MG capsule Take 2,000 mg by mouth as directed. Take 2000 mg 1 hour prior to dental procedures    . aspirin 81 MG tablet Take 81 mg by mouth daily.      Marland Kitchen aspirin-acetaminophen-caffeine (EXCEDRIN MIGRAINE) 250-250-65 MG tablet Take 2 tablets by mouth every 6 (six) hours as needed for headache.    . cholecalciferol (VITAMIN D3) 25 MCG (1000 UT) tablet Take 1,000 Units by mouth daily.    Marland Kitchen denosumab (PROLIA) 60 MG/ML SOSY injection Inject 60 mg into the skin every 6 (six) months.     . diphenhydramine-acetaminophen (TYLENOL PM) 25-500 MG TABS tablet Take 2 tablets by mouth at bedtime as needed (sleep).     . docusate sodium (COLACE) 100 MG capsule Take 100 mg by mouth daily.     Marland Kitchen ezetimibe (ZETIA) 10 MG tablet Take 10 mg by mouth every evening.     . ferrous sulfate 325 (65 FE) MG tablet Take 325 mg by mouth 2 (two) times a day.     . furosemide (LASIX) 40 MG tablet TAKE 1 TABLET EVERY DAY 90 tablet 1  . levothyroxine (SYNTHROID) 100 MCG tablet TAKE 1 TABLET EVERY DAY 90 tablet 3  . Magnesium 250 MG TABS Take 250 mg by mouth daily.     . metoprolol (TOPROL-XL) 50 MG 24 hr tablet Take 50 mg by mouth 2 (two) times daily.     Marland Kitchen omeprazole (PRILOSEC) 20 MG capsule Take 1 capsule (20 mg total) by mouth daily. Take 30 minutes before breakfast or dinner. 90 capsule 3  . potassium chloride SA (KLOR-CON) 20 MEQ tablet TAKE 1 TABLET EVERY DAY 90 tablet 3   . REPATHA SURECLICK 509 MG/ML SOAJ Inject 140 mg into the skin every 14 (fourteen) days.    Marland Kitchen spironolactone (ALDACTONE) 25 MG tablet Take 25 mg by mouth 2 (two) times daily.     Marland Kitchen warfarin (COUMADIN) 5 MG tablet TAKE 1 TABLET EVERY DAY EXCEPT TAKE 1/2 TABLET ON FRIDAYS AS DIRECTED 100 tablet 1   No current facility-administered medications on file prior to visit.   Allergies  Allergen Reactions  . Digoxin Other (See Comments)    bradycardia  . Statins     myalgia   Family History  Problem Relation Age of Onset  . Heart disease Mother   . Cancer Mother        breast  . Other Father        hunting gun accident  . Heart disease Maternal Aunt   . Cancer Maternal Aunt        uterine  . Heart disease Maternal Grandmother   . Diabetes Maternal Grandmother   . Hypercalcemia Neg Hx   . Colon cancer Neg Hx   . Esophageal cancer Neg Hx   . Liver disease Neg Hx   . Pancreatic cancer Neg Hx   . Rectal cancer Neg Hx   . Stomach cancer Neg Hx     PE: BP 128/82   Pulse 90   Ht 5\' 3"  (1.6 m)   Wt 134 lb (60.8 kg)   SpO2 96%   BMI 23.74 kg/m  Wt  Readings from Last 3 Encounters:  07/26/20 134 lb (60.8 kg)  04/28/20 130 lb 12.8 oz (59.3 kg)  04/03/20 128 lb 6.4 oz (58.2 kg)   Constitutional: normal weight, in NAD Eyes: PERRLA, EOMI, no exophthalmos ENT: moist mucous membranes, no thyromegaly, no cervical lymphadenopathy Cardiovascular: RRR, No MRG, + leg swelling Respiratory: CTA B Gastrointestinal: abdomen soft, NT, ND, BS+ Musculoskeletal: no deformities, strength intact in all 4 Skin: moist, warm, no rashes Neurological: no tremor with outstretched hands, DTR normal in all 4  Assessment: 1. Osteoporosis - Vertebral compression fractures (T6 and T7- 10/2018), s/p kyphoplasty  2.  Hypercalcemia  Plan: 1. Osteoporosis with vertebral compression fracture -This is likely postmenopausal/age-related + family history of osteoporosis + also possibly related to her mantle  radiation treatment -Latest bone density scan was from 03/22/2019 and was worse.  This showed osteoporosis, while on the previous scan from 2018, the T-scores were in the osteopenia range.  She is due for another bone density scan at the end of this year.  I advised her DXA me a message so I can order it at [facility]. -We started Prolia-first injection 04/01/2019.  She had 2 more injections afterwards, on 10/01/2019 and 04/14/2020.  She had problems with insurance covering these.  Next injection is due in 09/2020 -She denies jaw/hip/thigh pain after Prolia injection -At this visit we discussed about continuing Prolia for at least 6 to 10 years -She is getting enough calcium in the diet.  We stopped her calcium supplement in the past as her calcium was elevated.  Latest calcium level see below) -She continues on 1000 units vitamin D daily and her level was normal less than 4 months ago.  We will not repeat this today. -She is due for another bone density scan towards the end of the year.  We discussed that stable or increasing T-scores are desirable but these are just surrogate markers, the best way to evaluate Prolia efficacy is her not having any more fractures. -we will continue with Prolia for now -I will see her back in 1 year  3.  Hypercalcemia -She has a history of hypercalcemia, most likely iatrogenic due to calcium supplements -We stopped her multivitamin with calcium and she continues only on 1000 units vitamin D daily -Her vitamin D level was normal at last visit -Reviewed ionized calcium and PTH levels from 03/2020 and these were normal -She has an appointment with cardiology later this week and they will check a BMP  Philemon Kingdom, MD PhD [facility] Endocr[facility]ology

## 2020-07-26 NOTE — Patient Instructions (Addendum)
Please continue vitamin D 1000 units daily.  Continue Prolia.  In 02/2021, send me a message to order the new DXA scan.  Please come back for a follow-up appointment in 1 year.

## 2020-07-28 DIAGNOSIS — Z952 Presence of prosthetic heart valve: Secondary | ICD-10-CM | POA: Diagnosis not present

## 2020-07-28 DIAGNOSIS — I071 Rheumatic tricuspid insufficiency: Secondary | ICD-10-CM | POA: Diagnosis not present

## 2020-08-11 ENCOUNTER — Other Ambulatory Visit: Payer: Self-pay

## 2020-08-11 ENCOUNTER — Other Ambulatory Visit: Payer: Self-pay | Admitting: Gastroenterology

## 2020-08-11 MED ORDER — OMEPRAZOLE 20 MG PO CPDR: 20.0000 mg | DELAYED_RELEASE_CAPSULE | Freq: Every day | ORAL | 3 refills | Status: DC

## 2020-08-22 ENCOUNTER — Ambulatory Visit: Payer: Medicare PPO

## 2020-08-22 ENCOUNTER — Other Ambulatory Visit: Payer: Self-pay

## 2020-08-22 ENCOUNTER — Ambulatory Visit: Payer: Medicare PPO | Admitting: General Practice

## 2020-08-22 DIAGNOSIS — Z7901 Long term (current) use of anticoagulants: Secondary | ICD-10-CM | POA: Diagnosis not present

## 2020-08-22 DIAGNOSIS — Z952 Presence of prosthetic heart valve: Secondary | ICD-10-CM | POA: Diagnosis not present

## 2020-08-22 LAB — POCT INR: INR: 2.5 (ref 2.0–3.0)

## 2020-08-22 NOTE — Progress Notes (Signed)
Medical screening examination/treatment/procedure(s) were performed by non-physician practitioner and as supervising physician I was immediately available for consultation/collaboration. I agree with above. Laurann Mcmorris, MD   

## 2020-08-22 NOTE — Patient Instructions (Addendum)
Pre visit review using our clinic review tool, if applicable. No additional management support is needed unless otherwise documented below in the visit note.  Continue to take 1 tablet daily except 1/2 tablet on Mon and Fridays.  Re-check in in 6 weeks.

## 2020-08-30 ENCOUNTER — Telehealth: Payer: Self-pay | Admitting: Internal Medicine

## 2020-08-30 NOTE — Telephone Encounter (Signed)
Last Prolia injection was 04/14/2020 so next injection will not be due until 10/13/2020. Per telephone encounter 04/14/2020 PA was approved, approval # [id number] valid until 05/26/2021.   May need to obtain new PA if current PA was done under PCP office.

## 2020-08-30 NOTE — Telephone Encounter (Signed)
Pt called stating that her prolla runs out 09/13/2020 through her insurance. She has not received a phone call back since two weeks ago she is wanting to know if she needs to pick up her prolla today. Also which doctors office is going to give her the injection. Her last injection was done at [place]. She talked to pharmacy and pharmacy told her she was approved for the prolla    Phone number [phone] [facility] special pharmacy

## 2020-08-31 NOTE — Telephone Encounter (Signed)
Called and spoke with pt. PA was completed for [facility] but was given at [facility] due to last minute scheduling and pt being overdue for injection by a month.  Pt called because even though her Josem Kaufmann is good until the end of the year her pharmacy [facility]) advised her after 09/13/2020 a new PA will be required. Pt was requesting we request her Prolia early as to not have to complete a new PA because the dates are off by a month. [place].

## 2020-09-04 NOTE — Telephone Encounter (Signed)
Pt contacted via MyChart message.

## 2020-09-04 NOTE — Telephone Encounter (Signed)
Insurance will not cover Prolia if given sooner than 6 months and 1 day between injections.   I will, however, resubmit a new prior authorization for Prolia before pt's next injection due date.   This office will be in touch regarding scheduling closer to Prolia inj due date.

## 2020-09-19 NOTE — Telephone Encounter (Signed)
Prolia VOB initiated via [email or web address]  Last OV 07/26/20 Next OV Last Prolia inj 04/14/20 Next Prolia inj due 10/13/20

## 2020-09-20 NOTE — Telephone Encounter (Signed)
MEDICAL BENEFIT SUMMARY Patient Out-of-Pocket Responsibility Coverage Available Authorization Required Deductible Co-pay/Coinsurance Prolia OOP COST PHYSICIAN FACILITY FEE ADMIN FEE PURCHASE OR REFERRAL: YES YES PA Required PRIMARY No SECONDARY No No* No* $35* SPECIALTY PHARMACY (via Medical Benefit): NO YES No* No* No* *Reflects patient costs once plan deductible is met. Please see Medical Benefit Details for further information regarding patient costs. Patient costs may vary based on services rendered. BENEFITS VERIFIED FOR THE FOLLOWING DIAGNOSIS AND INSURANCE PLANS Verified for Diagnosis M81.0 Site of Care  MD Office   Policy Level: Primary Policy Status: Active Payer Name: [facility] Plan Name: MEDICARE PPO NON-GATE [facility] ANY Policy Number: [id number] Employer Name: [employer] Plan Type: Medicare Managed Care Group Number: [id number]  Payer Phone: [phone] PRIMARY MEDICAL BENEFIT DETAILS (PHYSICIAN PURCHASE, OR REFERRAL TO TREATING SITE) COVERAGE AVAILABLE: Yes COVERAGE DETAILS: Prolia is covered at 100%. The patient will be responsible for a $35.00 for administration. The benefits provided on this Verification of Benefits form are Medical Benefits and are the patient's In-Network benefits for Prolia. If you would like Pharmacy Benefits for Prolia, please call [phone].   AUTHORIZATION REQUIRED: Yes   PA PROCESS DETAILS: PA is required. PA can be initiated by calling [phone] or online at [email or web address]

## 2020-09-23 NOTE — Telephone Encounter (Addendum)
PA initiated via [email or web address]  [employer] (Key: [id number])  Your information has been submitted to [facility]. [employer] will review the request and will issue a decision, typically within 3-7 days from your submission. You can check the updated outcome later by reopening this request.  If [employer] has not responded in 3-7 days or if you have any questions about your ePA request, please contact [employer] at [phone]. If you think there may be a problem with your PA request, use our live chat feature at the bottom right.  For [place] requests, please call [phone].    The following medications may be covered If clinically appropriate, you may change the prescription. A therapeutic alternative may be available. Questions on alternatives? We're here to help. [place] at [phone]. You can also review [employer] NCPDP 2017's formulary online or call them directly. Compare the original medication with possible alternatives:  PA REQUIREMENT Prolia 60MG /ML syringes Not Required Alendronate 70 Mg Tab Not Required Evenity 105 Mg/1.17 Ml Syrg Required Evenity 210Mg /2.34Ml ( 105Mg /1.17Mlx2) Syrg Required Ibandronate (Soln) Required Ibandronate (Syrg) Required Ibandronate (Tab) Required Risedronate 150 Mg Tab Required Teriparatide Required Tymlos Required Xgeva Required Zoledronic Acid-Mannitol-Water 5 Mg/100 Ml Pgbk Required  Terms of service apply. Alternatives and PA Requirements listed above are based on third party available formulary data and may not apply to all plans. Check patient's specific plan formulary.

## 2020-09-24 ENCOUNTER — Other Ambulatory Visit: Payer: Self-pay | Admitting: Family Medicine

## 2020-09-26 NOTE — Telephone Encounter (Signed)
[facility] Medicare PA APPROVED PA# [id number] Valid 09/29/19-05/26/21  Grace [name] (Key: [id number])  This request has been approved.  Please note any additional information provided by [facility] at the bottom of your screen.

## 2020-09-26 NOTE — Telephone Encounter (Signed)
Pt ready for scheduling on or after 10/13/2020  Out-of--pocket cost due at time of visit: $35  Prolia co-insurance: 0% Admin fee co-insurance: $35.00  Deductible does not apply   Prior Auth APPROVED valid 09/29/19-05/26/21

## 2020-09-29 NOTE — Progress Notes (Signed)
Phone [phone] In person visit   Subjective:   Grace [name] is a 70 y.o. year old very pleasant female patient who presents for/with See problem oriented charting Chief Complaint  Patient presents with  . Hypothyroidism  . Chronic Kidney Disease    Stage three   . Hyperlipidemia  . Osteoporosis   This visit occurred during the SARS-CoV-2 public health emergency.  Safety protocols were in place, including screening questions prior to the visit, additional usage of staff PPE, and extensive cleaning of exam room while observing appropriate contact time as indicated for disinfecting solutions.   Past Medical History-  Patient Active Problem List   Diagnosis Date Noted  . Atrial flutter ([place]) 12/20/2016    Priority: High  . Osteoporosis 01/17/2014    Priority: High  . H/O aortic valve replacement and mitral valve replacement. 01/07/2013    Priority: High  . WOLFF (WOLFE)-PARKINSON-WHITE (WPW) SYNDROME 08/16/2009    Priority: High  . Congestive heart failure ([place]) 08/03/2009    Priority: High  . TRANSIENT ISCHEMIC ATTACKS, HX OF 04/27/2007    Priority: High  . History of Hodgkin's disease 11/03/2006    Priority: High  . History of colonic polyps 02/26/2019    Priority: Medium  . Hypertension 12/20/2016    Priority: Medium  . CKD (chronic kidney disease), stage III ([place]) 01/18/2014    Priority: Medium  . Iron deficiency anemia 08/16/2009    Priority: Medium  . Hyperlipidemia 08/29/2008    Priority: Medium  . Hypothyroidism 11/03/2006    Priority: Medium  . COMMON MIGRAINE 11/03/2006    Priority: Medium  . BREAST CANCER, HX OF 11/03/2006    Priority: Medium  . Long term (current) use of anticoagulants 12/23/2012    Priority: Low  . GASTROINTESTINAL HEMORRHAGE, HX OF 11/03/2006    Priority: Low  . Anemia 10/28/2019  . Other hemorrhoids 09/18/2019  . Diverticulosis of colon without hemorrhage 09/18/2019  . Esophagitis determined by endoscopy 09/18/2019  .  Schatzki's ring 09/18/2019  . Hiatal hernia 09/18/2019  . Acute left-sided low back pain without sciatica 01/08/2019  . Thoracic compression fracture, with delayed healing, subsequent encounter 12/10/2018  . Compression fracture of thoracic spine, non-traumatic, initial encounter ([place]) 11/09/2018  . Acute midline thoracic back pain 11/03/2018  . Sacroiliac pain 10/12/2018  . Baker cyst, left 10/12/2018  . Microcytic anemia 09/17/2018  . Hx of long-term (current) use of anticoagulants 07/22/2018  . Hypercalcemia 12/23/2017    Medications- reviewed and updated Current Outpatient Medications  Medication Sig Dispense Refill  . amoxicillin (AMOXIL) 500 MG capsule Take 2,000 mg by mouth as directed. Take 2000 mg 1 hour prior to dental procedures    . aspirin 81 MG tablet Take 81 mg by mouth daily.    Marland Kitchen aspirin-acetaminophen-caffeine (EXCEDRIN MIGRAINE) 250-250-65 MG tablet Take 2 tablets by mouth every 6 (six) hours as needed for headache.    . cholecalciferol (VITAMIN D3) 25 MCG (1000 UT) tablet Take 1,000 Units by mouth daily.    Marland Kitchen denosumab (PROLIA) 60 MG/ML SOSY injection Inject 60 mg into the skin every 6 (six) months.     . diphenhydramine-acetaminophen (TYLENOL PM) 25-500 MG TABS tablet Take 2 tablets by mouth at bedtime as needed (sleep).     . docusate sodium (COLACE) 100 MG capsule Take 100 mg by mouth daily.    Marland Kitchen ezetimibe (ZETIA) 10 MG tablet Take 10 mg by mouth every evening.     . furosemide (LASIX) 40 MG tablet TAKE 1  TABLET EVERY DAY 90 tablet 0  . levothyroxine (SYNTHROID) 100 MCG tablet TAKE 1 TABLET EVERY DAY 90 tablet 3  . Magnesium 250 MG TABS Take 250 mg by mouth daily.     . metoprolol (TOPROL-XL) 50 MG 24 hr tablet Take 50 mg by mouth 2 (two) times daily.    Marland Kitchen omeprazole (PRILOSEC) 20 MG capsule Take 1 capsule (20 mg total) by mouth daily. Take 30 minutes before breakfast or dinner. 90 capsule 3  . potassium chloride SA (KLOR-CON) 20 MEQ tablet TAKE 1 TABLET EVERY DAY  90 tablet 3  . REPATHA SURECLICK 841 MG/ML SOAJ Inject 140 mg into the skin every 14 (fourteen) days.    Marland Kitchen spironolactone (ALDACTONE) 25 MG tablet Take 25 mg by mouth 2 (two) times daily.    Marland Kitchen warfarin (COUMADIN) 5 MG tablet TAKE 1 TABLET EVERY DAY EXCEPT TAKE 1/2 TABLET ON FRIDAYS AS DIRECTED 100 tablet 1   No current facility-administered medications for this visit.     Objective:  BP 130/82   Pulse 82   Temp 98.4 F (36.9 C) (Temporal)   Ht 5\' 3"  (1.6 m)   Wt 131 lb (59.4 kg)   SpO2 99%   BMI 23.21 kg/m  Gen: NAD, resting comfortably CV: stable mechanical sounding murmur Lungs: CTAB no crackles, wheeze, rhonchi Abdomen: soft/nontender/nondistended/normal bowel sounds. Ext: no edema, extensive varicose veins Skin: warm, dry    Assessment and Plan   #History of Hodgkin's disease in 1970s-completed  extensive chemotherapy and radiation  #Cardiovascular issues followed by [facility] cardiology-  S: cardiologist at [facility] will be moving to [place]- she will be with Dr. Jeani Hawking Ward with next visit 2013 1.  History of Wolff-Parkinson-White syndrome 2.  History of aortic and mitral valve replacement mechanical-on long-term Coumadin followed at our clinic.Stable, patient denies issues with Coumadin 3.  Atrial fibrillation-on Coumadin for anticoagulation and metoprolol for rate control.  Appears appropriately rate controlled. 4.  Restrictive CHF 01/05/2019 EF up to 50%-has shortness of breath with stairs.  Compliant with Lasix 40 mg daily and spironolactone 25 mg daily.  Denies worsening edema or shortness of breath- stable overall.  Requires potassium supplement despite spironolactone 5. moderate distal coronary disease by cath 2008- on asa in addition to Coumadin. Also on repatha A/P: Issues x5 appears stable-continue follow-up with cardiology at [facility]- new Dr. Leonides Schanz next year.  Continue current medication  #hypothyroidism S: compliant On thyroid medication-levothyroxine 112 mcg--> 100 mcg Lab  Results  Component Value Date   TSH 0.61 04/03/2020  A/P:hopefully stable- update tsh today- for now continue current meds   #CKD stage III S: Patient knows to avoid NSAIDs.  Blood pressure well controlled  .  GFR in the 50s most frequently-on last check was in the 40s A/P: hopefully stbale- update today   #Osteoporosis S:Patient has transitioned care to Dr. Cruzita Lederer.  She has been started on Prolia (started November 5th 2020)  due to worsening bone density in late 2020 along with compression fracture t6-t7 (did well with kyphoplasty) .  She is compliant with calcium and vitamin D.  Previously had been on Boniva for over 5 years. A/P: continue follow up with endocrine and prolia injections- due this month- on the 20th  #hyperlipidemia S: compliant with Zetia 10 mg and now with new start of repatha due to being  Statin intolerant -History of TIA as well as distal CAD so  ideally should target LDL under 70 Lab Results  Component Value Date   CHOL  92 11/17/2019   HDL 59 11/17/2019   LDLCALC 7 11/17/2019   LDLDIRECT 68.0 04/28/2018   TRIG 128 11/17/2019   CHOLHDL 1.6 11/17/2019    A/P: LDL has been very well controlled-too soon for repeat full lipid panel-we will check at next labs if not checked by cardiology  #History of iron deficiency anemia- hematology has stopped her iron-we will update ferritin levels today. Polypoid lesion removed on EGD in past.  - denies straining but twice has had blood in stool over recent weeks. Last colonoscopy 02/22/2019. Suspect internal hemorrhoids. Has felt slightly more tired.  -has used compression stockings in the past- complete.  -for now continue coumadin and aspirin  #varicose veins -painful large vessel on back of left leg- multiple varicose and spider veins noted on exam - patient requests referral to vascular- placed today- she will be consistent with her compression stockings until that visit- she can stop back by medical supply store for  updated pair if needed  Recommended follow up: Keep physical scheduled in November Future Appointments  Date Time Provider [place]  10/03/2020  8:00 AM [facility] [place] [facility] None  10/13/2020 10:00 AM [facility] NURSE [facility] None  12/05/2020  1:00 PM [facility] None  12/05/2020  1:30 PM Wyatt Portela, MD [facility] None  04/04/2021  8:00 AM Marin Olp, MD [facility]  07/26/2021  9:20 AM Philemon Kingdom, MD [facility] None   Lab/Order associations:   ICD-10-CM   1. Primary hypertension  I10 CBC with Differential/Platelet    Comprehensive metabolic panel  2. Hypothyroidism, unspecified type  E03.9 TSH  3. Stage 3 chronic kidney disease, unspecified whether stage 3a or 3b CKD (HCC)  N18.30   4. Hyperlipidemia, unspecified hyperlipidemia type  E78.5   5. Iron deficiency anemia, unspecified iron deficiency anemia type  D50.9 CBC with Differential/Platelet    IBC + Ferritin  6. Symptomatic varicose veins, left  K02.542 Ambulatory referral to Vascular Surgery   Return precautions advised.  Garret Reddish, MD

## 2020-09-29 NOTE — Patient Instructions (Addendum)
Health Maintenance Due  Topic Date Due  . COVID-19 Vaccine (4 - Booster for Moderna series)- I would recommend this for your situation 09/23/2020   You are eligible to schedule your annual wellness visit with our nurse specialist Otila Kluver.  Please consider scheduling this before you leave today  Please stop by lab before you go If you have mychart- we will send your results within 3 business days of [place] receiving them.  If you do not have mychart- we will call you about results within 5 business days of [place] receiving them.  *please also note that you will see labs on mychart as soon as they post. I will later go in and write notes on them- will say "notes from Dr. [name]"  We will call you within two weeks about your referral to vascular surgery. If you do not hear within 2 weeks, give [place] a call.   Recommended follow up: keep physical that is already scheduled

## 2020-10-02 ENCOUNTER — Other Ambulatory Visit: Payer: Self-pay

## 2020-10-02 ENCOUNTER — Encounter: Payer: Self-pay | Admitting: Family Medicine

## 2020-10-02 ENCOUNTER — Ambulatory Visit: Payer: Medicare PPO | Admitting: Family Medicine

## 2020-10-02 VITALS — BP 130/82 | HR 82 | Temp 98.4°F | Ht 63.0 in | Wt 131.0 lb

## 2020-10-02 DIAGNOSIS — D509 Iron deficiency anemia, unspecified: Secondary | ICD-10-CM

## 2020-10-02 DIAGNOSIS — E785 Hyperlipidemia, unspecified: Secondary | ICD-10-CM | POA: Diagnosis not present

## 2020-10-02 DIAGNOSIS — I83892 Varicose veins of left lower extremities with other complications: Secondary | ICD-10-CM | POA: Diagnosis not present

## 2020-10-02 DIAGNOSIS — N183 Chronic kidney disease, stage 3 unspecified: Secondary | ICD-10-CM | POA: Diagnosis not present

## 2020-10-02 DIAGNOSIS — I10 Essential (primary) hypertension: Secondary | ICD-10-CM | POA: Diagnosis not present

## 2020-10-02 DIAGNOSIS — E039 Hypothyroidism, unspecified: Secondary | ICD-10-CM

## 2020-10-02 LAB — COMPREHENSIVE METABOLIC PANEL
ALT: 26 U/L (ref 0–35)
AST: 32 U/L (ref 0–37)
Albumin: 4.3 g/dL (ref 3.5–5.2)
Alkaline Phosphatase: 75 U/L (ref 39–117)
BUN: 24 mg/dL — ABNORMAL HIGH (ref 6–23)
CO2: 30 mEq/L (ref 19–32)
Calcium: 10.1 mg/dL (ref 8.4–10.5)
Chloride: 100 mEq/L (ref 96–112)
Creatinine, Ser: 1.21 mg/dL — ABNORMAL HIGH (ref 0.40–1.20)
GFR: 45.5 mL/min — ABNORMAL LOW (ref >60.00)
Glucose, Bld: 89 mg/dL (ref 70–99)
Potassium: 3.8 mEq/L (ref 3.5–5.1)
Sodium: 138 mEq/L (ref 135–145)
Total Bilirubin: 0.5 mg/dL (ref 0.2–1.2)
Total Protein: 7.6 g/dL (ref 6.0–8.3)

## 2020-10-02 LAB — CBC WITH DIFFERENTIAL/PLATELET
Basophils Absolute: 0.1 10*3/uL (ref 0.0–0.1)
Basophils Relative: 1.1 % (ref 0.0–3.0)
Eosinophils Absolute: 0.3 10*3/uL (ref 0.0–0.7)
Eosinophils Relative: 3.2 % (ref 0.0–5.0)
HCT: 37 % (ref 36.0–46.0)
Hemoglobin: 12.3 g/dL (ref 12.0–15.0)
Lymphocytes Relative: 6.1 % — ABNORMAL LOW (ref 12.0–46.0)
Lymphs Abs: 0.6 10*3/uL — ABNORMAL LOW (ref 0.7–4.0)
MCHC: 33.3 g/dL (ref 30.0–36.0)
MCV: 89.4 fl (ref 78.0–100.0)
Monocytes Absolute: 1.3 10*3/uL — ABNORMAL HIGH (ref 0.1–1.0)
Monocytes Relative: 14.1 % — ABNORMAL HIGH (ref 3.0–12.0)
Neutro Abs: 7 10*3/uL (ref 1.4–7.7)
Neutrophils Relative %: 75.5 % (ref 43.0–77.0)
Platelets: 453 10*3/uL — ABNORMAL HIGH (ref 150.0–400.0)
RBC: 4.14 Mil/uL (ref 3.87–5.11)
RDW: 14.7 % (ref 11.5–15.5)
WBC: 9.3 10*3/uL (ref 4.0–10.5)

## 2020-10-02 LAB — IBC + FERRITIN
Ferritin: 44.4 ng/mL (ref 10.0–291.0)
Iron: 72 ug/dL (ref 42–145)
Saturation Ratios: 14.8 % — ABNORMAL LOW (ref 20.0–50.0)
Transferrin: 348 mg/dL (ref 212.0–360.0)

## 2020-10-02 LAB — TSH: TSH: 0.35 u[IU]/mL (ref 0.35–4.50)

## 2020-10-03 ENCOUNTER — Ambulatory Visit: Payer: Medicare PPO | Admitting: General Practice

## 2020-10-03 DIAGNOSIS — Z7901 Long term (current) use of anticoagulants: Secondary | ICD-10-CM | POA: Diagnosis not present

## 2020-10-03 DIAGNOSIS — Z952 Presence of prosthetic heart valve: Secondary | ICD-10-CM

## 2020-10-03 LAB — POCT INR: INR: 3 (ref 2.0–3.0)

## 2020-10-03 NOTE — Patient Instructions (Addendum)
Pre visit review using our clinic review tool, if applicable. No additional management support is needed unless otherwise documented below in the visit note.  Continue to take 1 tablet daily except 1/2 tablet on Mon and Fridays.  Re-check in in 6 weeks.    

## 2020-10-03 NOTE — Progress Notes (Signed)
Medical screening examination/treatment/procedure(s) were performed by non-physician practitioner and as supervising physician I was immediately available for consultation/collaboration. I agree with above. Hallie Ertl, MD   

## 2020-10-05 ENCOUNTER — Other Ambulatory Visit: Payer: Self-pay

## 2020-10-05 DIAGNOSIS — I8390 Asymptomatic varicose veins of unspecified lower extremity: Secondary | ICD-10-CM

## 2020-10-13 ENCOUNTER — Ambulatory Visit: Payer: Medicare PPO

## 2020-10-16 ENCOUNTER — Ambulatory Visit (INDEPENDENT_AMBULATORY_CARE_PROVIDER_SITE_OTHER): Payer: Medicare PPO

## 2020-10-16 DIAGNOSIS — Z0000 Encounter for general adult medical examination without abnormal findings: Secondary | ICD-10-CM | POA: Diagnosis not present

## 2020-10-16 NOTE — Patient Instructions (Addendum)
Grace [name] , Thank you for taking time to come for your Medicare Wellness Visit. I appreciate your ongoing commitment to your health goals. Please review the following plan we discussed and let me know if I can assist you in the future.   Screening recommendations/referrals: Colonoscopy: Done 02/22/19 Mammogram: Done 03/27/20 Bone Density: Donee 03/22/19 Recommended yearly ophthalmology/optometry visit for glaucoma screening and checkup Recommended yearly dental visit for hygiene and checkup  Vaccinations: Influenza vaccine: Up to date Pneumococcal vaccine: Up to date Tdap vaccine: Up to date Shingles vaccine: Completed 7/14 & 02/08/20   Covid-19:Completed 2/13, 3/13, & 03/26/20, 10/06/20  Advanced directives: Copies in chart  Conditions/risks identified: stay healthy and active   Next appointment: Follow up in one year for your annual wellness visit    Preventive Care 28 Years and Older, Female Preventive care refers to lifestyle choices and visits with your health care provider that can promote health and wellness. What does preventive care include?  A yearly physical exam. This is also called an annual well check.  Dental exams once or twice a year.  Routine eye exams. Ask your health care provider how often you should have your eyes checked.  Personal lifestyle choices, including:  Daily care of your teeth and gums.  Regular physical activity.  Eating a healthy diet.  Avoiding tobacco and drug use.  Limiting alcohol use.  Practicing safe sex.  Taking low-dose aspirin every day.  Taking vitamin and mineral supplements as recommended by your health care provider. What happens during an annual well check? The services and screenings done by your health care provider during your annual well check will depend on your age, overall health, lifestyle risk factors, and family history of disease. Counseling  Your health care provider may ask you questions about  your:  Alcohol use.  Tobacco use.  Drug use.  Emotional well-being.  Home and relationship well-being.  Sexual activity.  Eating habits.  History of falls.  Memory and ability to understand (cognition).  Work and work Statistician.  Reproductive health. Screening  You may have the following tests or measurements:  Height, weight, and BMI.  Blood pressure.  Lipid and cholesterol levels. These may be checked every 5 years, or more frequently if you are over 80 years old.  Skin check.  Lung cancer screening. You may have this screening every year starting at age 25 if you have a 30-pack-year history of smoking and currently smoke or have quit within the past 15 years.  Fecal occult blood test (FOBT) of the stool. You may have this test every year starting at age 3.  Flexible sigmoidoscopy or colonoscopy. You may have a sigmoidoscopy every 5 years or a colonoscopy every 10 years starting at age 39.  Hepatitis C blood test.  Hepatitis B blood test.  Sexually transmitted disease (STD) testing.  Diabetes screening. This is done by checking your blood sugar (glucose) after you have not eaten for a while (fasting). You may have this done every 1-3 years.  Bone density scan. This is done to screen for osteoporosis. You may have this done starting at age 26.  Mammogram. This may be done every 1-2 years. Talk to your health care provider about how often you should have regular mammograms. Talk with your health care provider about your test results, treatment options, and if necessary, the need for more tests. Vaccines  Your health care provider may recommend certain vaccines, such as:  Influenza vaccine. This is recommended every year.  Tetanus, diphtheria, and acellular pertussis (Tdap, Td) vaccine. You may need a Td booster every 10 years.  Zoster vaccine. You may need this after age 82.  Pneumococcal 13-valent conjugate (PCV13) vaccine. One dose is recommended  after age 74.  Pneumococcal polysaccharide (PPSV23) vaccine. One dose is recommended after age 59. Talk to your health care provider about which screenings and vaccines you need and how often you need them. This information is not intended to replace advice given to you by your health care provider. Make sure you discuss any questions you have with your health care provider. Document Released: 06/09/2015 Document Revised: 01/31/2016 Document Reviewed: 03/14/2015 Elsevier Interactive Patient Education  2017 [place] Prevention in the Home Falls can cause injuries. They can happen to people of all ages. There are many things you can do to make your home safe and to help prevent falls. What can I do on the outside of my home?  Regularly fix the edges of walkways and driveways and fix any cracks.  Remove anything that might make you trip as you walk through a door, such as a raised step or threshold.  Trim any bushes or trees on the path to your home.  Use bright outdoor lighting.  Clear any walking paths of anything that might make someone trip, such as rocks or tools.  Regularly check to see if handrails are loose or broken. Make sure that both sides of any steps have handrails.  Any raised decks and porches should have guardrails on the edges.  Have any leaves, snow, or ice cleared regularly.  Use sand or salt on walking paths during winter.  Clean up any spills in your garage right away. This includes oil or grease spills. What can I do in the bathroom?  Use night lights.  Install grab bars by the toilet and in the tub and shower. Do not use towel bars as grab bars.  Use non-skid mats or decals in the tub or shower.  If you need to sit down in the shower, use a plastic, non-slip stool.  Keep the floor dry. Clean up any water that spills on the floor as soon as it happens.  Remove soap buildup in the tub or shower regularly.  Attach bath mats securely with  double-sided non-slip rug tape.  Do not have throw rugs and other things on the floor that can make you trip. What can I do in the bedroom?  Use night lights.  Make sure that you have a light by your bed that is easy to reach.  Do not use any sheets or blankets that are too big for your bed. They should not hang down onto the floor.  Have a firm chair that has side arms. You can use this for support while you get dressed.  Do not have throw rugs and other things on the floor that can make you trip. What can I do in the kitchen?  Clean up any spills right away.  Avoid walking on wet floors.  Keep items that you use a lot in easy-to-reach places.  If you need to reach something above you, use a strong step stool that has a grab bar.  Keep electrical cords out of the way.  Do not use floor polish or wax that makes floors slippery. If you must use wax, use non-skid floor wax.  Do not have throw rugs and other things on the floor that can make you trip. What can I do  with my stairs?  Do not leave any items on the stairs.  Make sure that there are handrails on both sides of the stairs and use them. Fix handrails that are broken or loose. Make sure that handrails are as long as the stairways.  Check any carpeting to make sure that it is firmly attached to the stairs. Fix any carpet that is loose or worn.  Avoid having throw rugs at the top or bottom of the stairs. If you do have throw rugs, attach them to the floor with carpet tape.  Make sure that you have a light switch at the top of the stairs and the bottom of the stairs. If you do not have them, ask someone to add them for you. What else can I do to help prevent falls?  Wear shoes that:  Do not have high heels.  Have rubber bottoms.  Are comfortable and fit you well.  Are closed at the toe. Do not wear sandals.  If you use a stepladder:  Make sure that it is fully opened. Do not climb a closed stepladder.  Make  sure that both sides of the stepladder are locked into place.  Ask someone to hold it for you, if possible.  Clearly mark and make sure that you can see:  Any grab bars or handrails.  First and last steps.  Where the edge of each step is.  Use tools that help you move around (mobility aids) if they are needed. These include:  Canes.  Walkers.  Scooters.  Crutches.  Turn on the lights when you go into a dark area. Replace any light bulbs as soon as they burn out.  Set up your furniture so you have a clear path. Avoid moving your furniture around.  If any of your floors are uneven, fix them.  If there are any pets around you, be aware of where they are.  Review your medicines with your doctor. Some medicines can make you feel dizzy. This can increase your chance of falling. Ask your doctor what other things that you can do to help prevent falls. This information is not intended to replace advice given to you by your health care provider. Make sure you discuss any questions you have with your health care provider. Document Released: 03/09/2009 Document Revised: 10/19/2015 Document Reviewed: 06/17/2014 Elsevier Interactive Patient Education  2017 [employer].

## 2020-10-16 NOTE — Progress Notes (Addendum)
Virtual Visit via Telephone Note  I connected with  Grace [name] on 10/16/20 at  9:30 AM EDT by telephone and verified that I am speaking with the correct person using two identifiers.  Medicare Annual Wellness visit completed telephonically due to Covid-19 pandemic.   Persons participating in this call: This Health Coach and this patient.   Location: Patient: Home Provider: Office    I discussed the limitations, risks, security and privacy concerns of performing an evaluation and management service by telephone and the availability of in person appointments. The patient expressed understanding and agreed to proceed.  Unable to perform video visit due to video visit attempted and failed and/or patient does not have video capability.   Some vital signs may be absent or patient reported.   Willette Brace, LPN    Subjective:   Grace [name] is a 70 y.o. female who presents for Medicare Annual (Subsequent) preventive examination.  Review of Systems     Cardiac Risk Factors include: advanced age (>50men, >64 women);hypertension;dyslipidemia     Objective:    Today's Vitals   10/16/20 0928  PainSc: 3    There is no height or weight on file to calculate BMI.  Advanced Directives 10/16/2020 01/10/2020 11/09/2019 09/20/2019 02/22/2019 12/07/2018  Does Patient Have a Medical Advance Directive? Yes Yes Yes Yes Yes Yes  Type of Paramedic of [place];Living will - Living will;Healthcare Power of [place];Living will [place];Living will  Does patient want to make changes to medical advance directive? - - No - Patient declined No - Patient declined - No - Patient declined  Copy of [place] in Chart? Yes - validated most recent copy scanned in chart (See row information) No - copy requested - Yes - validated most recent copy scanned in chart (See row information)  Yes - validated most recent copy scanned in chart (See row information) Yes - validated most recent copy scanned in chart (See row information)    Current Medications (verified) Outpatient Encounter Medications as of 10/16/2020  Medication Sig  . aspirin 81 MG tablet Take 81 mg by mouth daily.  Marland Kitchen aspirin-acetaminophen-caffeine (EXCEDRIN MIGRAINE) 250-250-65 MG tablet Take 2 tablets by mouth every 6 (six) hours as needed for headache.  . cholecalciferol (VITAMIN D3) 25 MCG (1000 UT) tablet Take 1,000 Units by mouth daily.  Marland Kitchen denosumab (PROLIA) 60 MG/ML SOSY injection Inject 60 mg into the skin every 6 (six) months.   . diphenhydramine-acetaminophen (TYLENOL PM) 25-500 MG TABS tablet Take 2 tablets by mouth at bedtime as needed (sleep).   . docusate sodium (COLACE) 100 MG capsule Take 100 mg by mouth daily.  Marland Kitchen ezetimibe (ZETIA) 10 MG tablet Take 10 mg by mouth every evening.   . furosemide (LASIX) 40 MG tablet TAKE 1 TABLET EVERY DAY  . levothyroxine (SYNTHROID) 100 MCG tablet TAKE 1 TABLET EVERY DAY  . Magnesium 250 MG TABS Take 250 mg by mouth daily.   . metoprolol (TOPROL-XL) 50 MG 24 hr tablet Take 50 mg by mouth 2 (two) times daily.  Marland Kitchen omeprazole (PRILOSEC) 20 MG capsule Take 1 capsule (20 mg total) by mouth daily. Take 30 minutes before breakfast or dinner.  . potassium chloride SA (KLOR-CON) 20 MEQ tablet TAKE 1 TABLET EVERY DAY  . REPATHA SURECLICK 893 MG/ML SOAJ Inject 140 mg into the skin every 14 (fourteen) days.  Marland Kitchen spironolactone (ALDACTONE) 25 MG tablet Take  25 mg by mouth 2 (two) times daily.  Marland Kitchen warfarin (COUMADIN) 5 MG tablet TAKE 1 TABLET EVERY DAY EXCEPT TAKE 1/2 TABLET ON FRIDAYS AS DIRECTED  . amoxicillin (AMOXIL) 500 MG capsule Take 2,000 mg by mouth as directed. Take 2000 mg 1 hour prior to dental procedures (Patient not taking: Reported on 10/16/2020)   No facility-administered encounter medications on file as of 10/16/2020.    Allergies (verified) Digoxin and Statins    History: Past Medical History:  Diagnosis Date  . Anemia   . Aortic insufficiency   . AORTIC VALVE REPLACEMENT, HX OF 04/27/2007   Qualifier: Diagnosis of  By: Leanne Chang MD, Bruce    . Cancer [facility])    left breast- surgery and chemo  . CHF (congestive heart failure) ([place])   . Chronic kidney disease    Stage 3  . Congestive heart failure ([place]) 08/03/2009   [facility] Dr. Paul Half. Last echo 2010 with EF 30%. Restrictive due to radiation. AVR MVR. 02/2014 visit planned.    . Coronary artery disease    11/06/06 [facility]): 40% oLM, 60% dLAD, 30% oRCA  . Dyspnea    with exertion  . Dysrhythmia    paroxsymal a-flutter 2018; intermittent left BBB (2016)  . Edema of both feet   . GI bleed   . Heart murmur    mitral and aortic valve replacement  . Hodgkin's disease    ~1974, s/p [place] radiation and chemo  . Hyperlipidemia   . Hypertension   . Hypothyroidism   . Migraine    none since 2008  . MITRAL VALVE REPLACEMENT, HX OF 04/27/2007   Qualifier: Diagnosis of  By: Leanne Chang MD, Bruce    . Pericarditis    s/p pericardidal stripping through lateral thoracotomy 01/2007 [facility])  . Stroke [facility]) 2008   No residual effects  . TIA (transient ischemic attack)   . Tricuspid regurgitation   . Varicose veins of both lower extremities    left worse than right- have gotten larger in April 2020  . WPW (Wolff-Parkinson-White syndrome)    Past Surgical History:  Procedure Laterality Date  . AORTIC VALVE SURGERY     19 mm mechanical Regent AVR 05/2006 [facility], Dr. Lajuana Matte)  . bilateral breast implants    . BIOPSY  02/22/2019   Procedure: BIOPSY;  Surgeon: Rush Landmark Telford Nab., MD;  Location: [place];  Service: Gastroenterology;;  . COLONOSCOPY WITH PROPOFOL N/A 02/22/2019   Procedure: COLONOSCOPY WITH PROPOFOL;  Surgeon: Irving Copas., MD;  Location: [place];  Service: Gastroenterology;  Laterality: N/A;  . ENDOMETRIAL ABLATION    . ENDOSCOPIC MUCOSAL RESECTION  01/10/2020    Procedure: ENDOSCOPIC MUCOSAL RESECTION;  Surgeon: Rush Landmark Telford Nab., MD;  Location: [place];  Service: Gastroenterology;;  . ENTEROSCOPY N/A 02/22/2019   Procedure: ENTEROSCOPY;  Surgeon: Rush Landmark Telford Nab., MD;  Location: [place];  Service: Gastroenterology;  Laterality: N/A;  . ESOPHAGOGASTRODUODENOSCOPY (EGD) WITH PROPOFOL N/A 01/10/2020   Procedure: ESOPHAGOGASTRODUODENOSCOPY (EGD) WITH PROPOFOL;  Surgeon: Rush Landmark Telford Nab., MD;  Location: [place];  Service: Gastroenterology;  Laterality: N/A;  . EXPLORATORY LAPAROTOMY    . HEMOSTASIS CLIP PLACEMENT  02/22/2019   Procedure: HEMOSTASIS CLIP PLACEMENT;  Surgeon: Irving Copas., MD;  Location: [place];  Service: Gastroenterology;;  . HEMOSTASIS CLIP PLACEMENT  01/10/2020   Procedure: HEMOSTASIS CLIP PLACEMENT;  Surgeon: Irving Copas., MD;  Location: [place];  Service: Gastroenterology;;  . KYPHOPLASTY N/A 12/10/2018   Procedure: KYPHOPLASTY THORACIC SIX- THORACIC SEVEN;  Surgeon: Newman Pies, MD;  Location: [place];  Service: Neurosurgery;  Laterality: N/A;  KYPHOPLASTY THORACIC SIX- THORACIC SEVEN  . MASTECTOMY    . MITRAL VALVE REPLACEMENT     25 mm St. Jude MVR, 05/2006, Dr. Lajuana Matte, [facility]  . pericardial stripping  9/08  . POLYPECTOMY  02/22/2019   Procedure: POLYPECTOMY;  Surgeon: Mansouraty, Telford Nab., MD;  Location: [place];  Service: Gastroenterology;;  . SPLENECTOMY     ~ 1974  . SPLENECTOMY, TOTAL    . SUBMUCOSAL LIFTING INJECTION  01/10/2020   Procedure: SUBMUCOSAL LIFTING INJECTION;  Surgeon: Rush Landmark Telford Nab., MD;  Location: [place];  Service: Gastroenterology;;  . Lia Foyer TATTOO INJECTION  02/22/2019   Procedure: SUBMUCOSAL TATTOO INJECTION;  Surgeon: Irving Copas., MD;  Location: [place];  Service: Gastroenterology;;  . THORACOTOMY     for pericardial stripping 01/2007 (Dr. Elenor Quinones, [facility])  . TONSILLECTOMY    . valvulopathy   06/13/06   Family History  Problem Relation Age of Onset  . Heart disease Mother   . Cancer Mother        breast  . Other Father        hunting gun accident  . Heart disease Maternal Aunt   . Cancer Maternal Aunt        uterine  . Heart disease Maternal Grandmother   . Diabetes Maternal Grandmother   . Hypercalcemia Neg Hx   . Colon cancer Neg Hx   . Esophageal cancer Neg Hx   . Liver disease Neg Hx   . Pancreatic cancer Neg Hx   . Rectal cancer Neg Hx   . Stomach cancer Neg Hx    Social History   Socioeconomic History  . Marital status: Married    Spouse name: Not on file  . Number of children: 0  . Years of education: Not on file  . Highest education level: Not on file  Occupational History  . Occupation: retired  Tobacco Use  . Smoking status: Never Smoker  . Smokeless tobacco: Never Used  Vaping Use  . Vaping Use: Never used  Substance and Sexual Activity  . Alcohol use: No  . Drug use: No  . Sexual activity: Not on file  Other Topics Concern  . Not on file  Social History Narrative   Lived in [place] for 18 years. Moved here for the weather.    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      Husband retired January 2015. Married 1991 (2nd marriage). No kids.       Hobbies: walks 2 miles per day, read, work outside         Scientist, physiological Strain: [place]   . Difficulty of Paying Living Expenses: Not hard at all  Food Insecurity: No Food Insecurity  . Worried About Charity fundraiser in the Last Year: Never true  . Ran Out of Food in the Last Year: Never true  Transportation Needs: No Transportation Needs  . Lack of Transportation (Medical): No  . Lack of Transportation (Non-Medical): No  Physical Activity: Sufficiently Active  . Days of Exercise per Week: 3 days  . Minutes of Exercise per Session: 90 min  Stress: No Stress Concern Present  . Feeling  of Stress : Only a little  Social Connections: Moderately Integrated  . Frequency of Communication with Friends and Family: More than three times a week  .  Frequency of Social Gatherings with Friends and Family: Twice a week  . Attends Religious Services: 1 to 4 times per year  . Active Member of Clubs or Organizations: No  . Attends Archivist Meetings: Never  . Marital Status: Married    Tobacco Counseling Counseling given: Not Answered   Clinical Intake:  Pre-visit preparation completed: Yes  Pain : 0-10 Pain Score: 3  Pain Type: Chronic pain Pain Location: Back Pain Descriptors / Indicators: Burning Pain Onset: More than a month ago Pain Frequency: Intermittent     BMI - recorded: 23.21 Nutritional Status: BMI of 19-24  Normal Nutritional Risks: None Diabetes: No  How often do you need to have someone help you when you read instructions, pamphlets, or other written materials from your doctor or pharmacy?: 1 - Never  Diabetic?No   Interpreter Needed?: No  Information entered by :: Charlott Rakes, LPN   Activities of Daily Living In your present state of health, do you have any difficulty performing the following activities: 10/16/2020 10/02/2020  Hearing? N N  Vision? N N  Difficulty concentrating or making decisions? N N  Walking or climbing stairs? N N  Dressing or bathing? N N  Doing errands, shopping? N N  Preparing Food and eating ? N -  Using the Toilet? N -  In the past six months, have you accidently leaked urine? N -  Do you have problems with loss of bowel control? N -  Managing your Medications? N -  Managing your Finances? N -  Housekeeping or managing your Housekeeping? N -  Some recent data might be hidden    Patient Care Team: Marin Olp, MD as PCP - General (Family Medicine) Obgyn, Erling Conte Mansouraty, Telford Nab., MD as Consulting Physician (Gastroenterology) Philemon Kingdom, MD as Consulting Physician (Internal  Medicine) Warden Fillers, RN as Registered Nurse Ward, Jeani Hawking, MD as Referring Physician (Cardiology) Wyatt Portela, MD as Consulting Physician (Oncology) [place], Lucas Mallow, MD [place] PA-C (Physician Assistant) Marney Doctor, DMD as Referring Physician (Oral Surgery)  Indicate any recent Medical Services you may have received from other than [facility] providers in the past year (date may be approximate).     Assessment:   This is a routine wellness examination for [place].  Hearing/Vision screen  Hearing Screening   125Hz  250Hz  500Hz  1000Hz  2000Hz  3000Hz  4000Hz  6000Hz  8000Hz   Right ear:           Left ear:           Comments: Pt denies any hearing issues  Vision Screening Comments: Pt follows up with dr [place] for annual eye exams   Dietary issues and exercise activities discussed: Current Exercise Habits: Home exercise routine, Type of exercise: walking, Time (Minutes): > 60, Frequency (Times/Week): 3, Weekly Exercise (Minutes/Week): 0  Goals Addressed            This Visit's Progress   . Patient Stated       Stay healthy       Depression Screen PHQ 2/9 Scores 10/16/2020 10/02/2020 10/14/2019 09/20/2019 10/08/2018 04/28/2018 12/20/2016  PHQ - 2 Score 0 0 1 0 0 0 0  PHQ- 9 Score - 3 2 - - - -    Fall Risk Fall Risk  10/16/2020 10/02/2020 09/20/2019 10/08/2018 04/28/2018  Falls in the past year? 0 0 0 0 0  Number falls in past yr: 0 0 0 - -  Injury with Fall? 0 0 0 - -  Risk for fall due to : Impaired vision - Orthopedic patient - -  Follow up Falls prevention discussed - Falls prevention discussed;Education provided;Falls evaluation completed - -    FALL RISK PREVENTION PERTAINING TO THE HOME:  Any stairs in or around the home? No  If so, are there any without handrails? No  Home free of loose throw rugs in walkways, pet beds, electrical cords, etc? Yes  Adequate lighting in your home to reduce risk of falls? Yes   ASSISTIVE DEVICES UTILIZED TO PREVENT  FALLS:  Life alert? No  Use of a cane, walker or w/c? No  Grab bars in the bathroom? Yes  Shower chair or bench in shower? Yes  Elevated toilet seat or a handicapped toilet? No   TIMED UP AND GO:  Was the test performed? No .    Cognitive Function:     6CIT Screen 10/16/2020 09/20/2019  What Year? 0 points 0 points  What month? 0 points 0 points  What time? - 0 points  Count back from 20 0 points 0 points  Months in reverse 0 points 0 points  Repeat phrase 0 points 0 points  Total Score - 0    Immunizations Immunization History  Administered Date(s) Administered  . Fluad Quad(high Dose 65+) 02/26/2019, 04/03/2020  . Influenza Split 03/21/2011, 03/24/2012  . Influenza Whole 03/16/2008, 04/27/2009, 03/01/2010  . Influenza, High Dose Seasonal PF 02/28/2016, 03/14/2017, 02/27/2018  . Influenza,inj,Quad PF,6+ Mos 03/12/2013, 03/15/2014, 03/14/2015  . Influenza-Unspecified 02/24/2014, 03/14/2015, 02/26/2019  . Moderna Sars-Covid-2 Vaccination 07/10/2019, 08/07/2019, 03/26/2020, 10/06/2020  . Pneumococcal Conjugate-13 03/26/2013  . Pneumococcal Polysaccharide-23 02/25/2003, 10/19/2015  . Td 06/24/2002, 03/26/2013  . Tdap 03/26/2013  . Zoster 04/24/2011  . Zoster Recombinat (Shingrix) 12/08/2019, 02/08/2020    TDAP status: Up to date  Flu Vaccine status: Up to date  Pneumococcal vaccine status: Up to date  Covid-19 vaccine status: Completed vaccines  Qualifies for Shingles Vaccine? Yes   Zostavax completed Yes   Shingrix Completed?: Yes  Screening Tests Health Maintenance  Topic Date Due  . INFLUENZA VACCINE  12/25/2020  . MAMMOGRAM  03/27/2022  . TETANUS/TDAP  03/27/2023  . COLONOSCOPY (Pts 45-47yrs Insurance coverage will need to be confirmed)  02/21/2029  . DEXA SCAN  Completed  . COVID-19 Vaccine  Completed  . Hepatitis C Screening  Completed  . PNA vac Low Risk Adult  Completed  . HPV VACCINES  Aged Out    Health Maintenance  There are no preventive  care reminders to display for this patient.  Colorectal cancer screening: Type of screening: Colonoscopy. Completed 02/22/19. Repeat every 10 years  Mammogram status: Completed 03/27/20. Repeat every year  Bone Density status: Completed 03/22/19. Results reflect: Bone density results: OSTEOPOROSIS. Repeat every 0 years.  Additional Screening:  Hepatitis C Screening:  Completed 03/16/15  Vision Screening: Recommended annual ophthalmology exams for early detection of glaucoma and other disorders of the eye. Is the patient up to date with their annual eye exam?  Yes  Who is the provider or what is the name of the office in which the patient attends annual eye exams? [facility]  If pt is not established with a provider, would they like to be referred to a provider to establish care? No .   Dental Screening: Recommended annual dental exams for proper oral hygiene  Community Resource Referral / Chronic Care Management: CRR required this visit?  No   CCM required this visit?  No      Plan:  I have personally reviewed and noted the following in the patient's chart:   . Medical and social history . Use of alcohol, tobacco or illicit drugs  . Current medications and supplements including opioid prescriptions.  . Functional ability and status . Nutritional status . Physical activity . Advanced directives . List of other physicians . Hospitalizations, surgeries, and ER visits in previous 12 months . Vitals . Screenings to include cognitive, depression, and falls . Referrals and appointments  In addition, I have reviewed and discussed with patient certain preventive protocols, quality metrics, and best practice recommendations. A written personalized care plan for preventive services as well as general preventive health recommendations were provided to patient.     Willette Brace, LPN   08/26/270   Nurse Notes: None

## 2020-10-17 ENCOUNTER — Ambulatory Visit: Payer: Medicare PPO

## 2020-10-17 ENCOUNTER — Other Ambulatory Visit: Payer: Self-pay

## 2020-10-17 DIAGNOSIS — M810 Age-related osteoporosis without current pathological fracture: Secondary | ICD-10-CM | POA: Diagnosis not present

## 2020-10-17 MED ORDER — DENOSUMAB 60 MG/ML ~~LOC~~ SOSY
60.0000 mg | PREFILLED_SYRINGE | Freq: Once | SUBCUTANEOUS | Status: AC
  Administered 2020-10-17: 60 mg via SUBCUTANEOUS

## 2020-10-17 NOTE — Progress Notes (Signed)
Prolia injection administered to pt's right arm. Pt tolerated well.

## 2020-10-21 NOTE — Telephone Encounter (Signed)
Pt received inj 10/17/20 Next due 04/20/21

## 2020-10-31 ENCOUNTER — Ambulatory Visit (HOSPITAL_COMMUNITY)
Admission: RE | Admit: 2020-10-31 | Discharge: 2020-10-31 | Disposition: A | Discharge status: Home / Self Care | Payer: Medicare PPO | Source: Ambulatory Visit | Attending: Vascular Surgery | Admitting: Vascular Surgery

## 2020-10-31 ENCOUNTER — Ambulatory Visit: Payer: Medicare PPO | Admitting: Oncology

## 2020-10-31 ENCOUNTER — Ambulatory Visit (INDEPENDENT_AMBULATORY_CARE_PROVIDER_SITE_OTHER): Payer: Medicare PPO | Admitting: Vascular Surgery

## 2020-10-31 ENCOUNTER — Other Ambulatory Visit: Payer: Medicare PPO

## 2020-10-31 ENCOUNTER — Other Ambulatory Visit: Payer: Self-pay

## 2020-10-31 ENCOUNTER — Encounter: Payer: Self-pay | Admitting: Vascular Surgery

## 2020-10-31 DIAGNOSIS — I872 Venous insufficiency (chronic) (peripheral): Secondary | ICD-10-CM | POA: Insufficient documentation

## 2020-10-31 DIAGNOSIS — I8390 Asymptomatic varicose veins of unspecified lower extremity: Secondary | ICD-10-CM | POA: Insufficient documentation

## 2020-10-31 NOTE — Progress Notes (Signed)
Patient name: Grace [name] MRN: [id number] DOB: 12-Nov-1950 Sex: female  REASON FOR CONSULT: Evaluate varicose veins bilateral lower extremities  HPI: Grace [name] is a 70 y.o. female, with multiple medical problems including history of Hodgkin's status post radiation chemotherapy, aortic and mitral valve replacement on Coumadin, Wolff-Parkinson-White, hypertension, hyperlipidemia, coronary disease, stage III CKD that presents for evaluation of varicose veins.  She states these have gotten significantly worse over the last several years and she has a lot of pain behind the varicosity in her left thigh.  She has also been having leg swelling that has been progressive over the last several years.  She has discussed with her PCP Dr. [name] who referred her here to vascular surgery.  Denies any hx of DVT.  Denies trauma.  Past Medical History:  Diagnosis Date  . Anemia   . Aortic insufficiency   . AORTIC VALVE REPLACEMENT, HX OF 04/27/2007   Qualifier: Diagnosis of  By: Leanne Chang MD, Bruce    . Cancer [facility])    left breast- surgery and chemo  . CHF (congestive heart failure) ([place])   . Chronic kidney disease    Stage 3  . Congestive heart failure ([place]) 08/03/2009   [facility] Dr. Paul Half. Last echo 2010 with EF 30%. Restrictive due to radiation. AVR MVR. 02/2014 visit planned.    . Coronary artery disease    11/06/06 [facility]): 40% oLM, 60% dLAD, 30% oRCA  . Dyspnea    with exertion  . Dysrhythmia    paroxsymal a-flutter 2018; intermittent left BBB (2016)  . Edema of both feet   . GI bleed   . Heart murmur    mitral and aortic valve replacement  . Hodgkin's disease    ~1974, s/p Mantle field radiation and chemo  . Hyperlipidemia   . Hypertension   . Hypothyroidism   . Migraine    none since 2008  . MITRAL VALVE REPLACEMENT, HX OF 04/27/2007   Qualifier: Diagnosis of  By: Leanne Chang MD, Bruce    . Pericarditis    s/p pericardidal stripping through lateral thoracotomy 01/2007 [facility])  . Stroke  [facility]) 2008   No residual effects  . TIA (transient ischemic attack)   . Tricuspid regurgitation   . Varicose veins of both lower extremities    left worse than right- have gotten larger in April 2020  . WPW (Wolff-Parkinson-White syndrome)     Past Surgical History:  Procedure Laterality Date  . AORTIC VALVE SURGERY     19 mm mechanical [employer] AVR 05/2006 [facility], Dr. Lajuana Matte)  . bilateral breast implants    . BIOPSY  02/22/2019   Procedure: BIOPSY;  Surgeon: Rush Landmark Telford Nab., MD;  Location: [place];  Service: Gastroenterology;;  . COLONOSCOPY WITH PROPOFOL N/A 02/22/2019   Procedure: COLONOSCOPY WITH PROPOFOL;  Surgeon: Irving Copas., MD;  Location: [place];  Service: Gastroenterology;  Laterality: N/A;  . ENDOMETRIAL ABLATION    . ENDOSCOPIC MUCOSAL RESECTION  01/10/2020   Procedure: ENDOSCOPIC MUCOSAL RESECTION;  Surgeon: Rush Landmark Telford Nab., MD;  Location: [place];  Service: Gastroenterology;;  . ENTEROSCOPY N/A 02/22/2019   Procedure: ENTEROSCOPY;  Surgeon: Rush Landmark Telford Nab., MD;  Location: [place];  Service: Gastroenterology;  Laterality: N/A;  . ESOPHAGOGASTRODUODENOSCOPY (EGD) WITH PROPOFOL N/A 01/10/2020   Procedure: ESOPHAGOGASTRODUODENOSCOPY (EGD) WITH PROPOFOL;  Surgeon: Rush Landmark Telford Nab., MD;  Location: [place];  Service: Gastroenterology;  Laterality: N/A;  . EXPLORATORY LAPAROTOMY    . HEMOSTASIS CLIP PLACEMENT  02/22/2019  Procedure: HEMOSTASIS CLIP PLACEMENT;  Surgeon: Irving Copas., MD;  Location: [place];  Service: Gastroenterology;;  . HEMOSTASIS CLIP PLACEMENT  01/10/2020   Procedure: HEMOSTASIS CLIP PLACEMENT;  Surgeon: Irving Copas., MD;  Location: [place];  Service: Gastroenterology;;  . KYPHOPLASTY N/A 12/10/2018   Procedure: KYPHOPLASTY THORACIC SIX- THORACIC SEVEN;  Surgeon: Newman Pies, MD;  Location: [place];  Service: Neurosurgery;  Laterality: N/A;  KYPHOPLASTY THORACIC  SIX- THORACIC SEVEN  . MASTECTOMY    . MITRAL VALVE REPLACEMENT     25 mm [employer] MVR, 05/2006, Dr. Lajuana Matte, [facility]  . pericardial stripping  9/08  . POLYPECTOMY  02/22/2019   Procedure: POLYPECTOMY;  Surgeon: Mansouraty, Telford Nab., MD;  Location: [place];  Service: Gastroenterology;;  . SPLENECTOMY     ~ 1974  . SPLENECTOMY, TOTAL    . SUBMUCOSAL LIFTING INJECTION  01/10/2020   Procedure: SUBMUCOSAL LIFTING INJECTION;  Surgeon: Rush Landmark Telford Nab., MD;  Location: [place];  Service: Gastroenterology;;  . Lia Foyer TATTOO INJECTION  02/22/2019   Procedure: SUBMUCOSAL TATTOO INJECTION;  Surgeon: Irving Copas., MD;  Location: [place];  Service: Gastroenterology;;  . THORACOTOMY     for pericardial stripping 01/2007 (Dr. Elenor Quinones, [facility])  . TONSILLECTOMY    . valvulopathy  06/13/06    Family History  Problem Relation Age of Onset  . Heart disease Mother   . Cancer Mother        breast  . Other Father        hunting gun accident  . Heart disease Maternal Aunt   . Cancer Maternal Aunt        uterine  . Heart disease Maternal Grandmother   . Diabetes Maternal Grandmother   . Hypercalcemia Neg Hx   . Colon cancer Neg Hx   . Esophageal cancer Neg Hx   . Liver disease Neg Hx   . Pancreatic cancer Neg Hx   . Rectal cancer Neg Hx   . Stomach cancer Neg Hx     SOCIAL HISTORY: Social History   Socioeconomic History  . Marital status: Married    Spouse name: Not on file  . Number of children: 0  . Years of education: Not on file  . Highest education level: Not on file  Occupational History  . Occupation: retired  Tobacco Use  . Smoking status: Never Smoker  . Smokeless tobacco: Never Used  Vaping Use  . Vaping Use: Never used  Substance and Sexual Activity  . Alcohol use: No  . Drug use: No  . Sexual activity: Not on file  Other Topics Concern  . Not on file  Social History Narrative   Lived in [place] for 18 years. Moved here for the weather.     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      Husband retired January 2015. Married 1991 (2nd marriage). No kids.       Hobbies: walks 2 miles per day, read, work outside         Scientist, physiological Strain: Zap   . Difficulty of Paying Living Expenses: Not hard at all  Food Insecurity: No Food Insecurity  . Worried About Charity fundraiser in the Last Year: Never true  . Ran Out of Food in the Last Year: Never true  Transportation Needs: No Transportation Needs  . Lack of Transportation (Medical): No  . Lack of  Transportation (Non-Medical): No  Physical Activity: Sufficiently Active  . Days of Exercise per Week: 3 days  . Minutes of Exercise per Session: 90 min  Stress: No Stress Concern Present  . Feeling of Stress : Only a little  Social Connections: Moderately Integrated  . Frequency of Communication with Friends and Family: More than three times a week  . Frequency of Social Gatherings with Friends and Family: Twice a week  . Attends Religious Services: 1 to 4 times per year  . Active Member of Clubs or Organizations: No  . Attends Archivist Meetings: Never  . Marital Status: Married  Human resources officer Violence: Not At Risk  . Fear of Current or Ex-Partner: No  . Emotionally Abused: No  . Physically Abused: No  . Sexually Abused: No    Allergies  Allergen Reactions  . Digoxin Other (See Comments)    bradycardia  . Statins     myalgia    Current Outpatient Medications  Medication Sig Dispense Refill  . aspirin 81 MG tablet Take 81 mg by mouth daily.    Marland Kitchen aspirin-acetaminophen-caffeine (EXCEDRIN MIGRAINE) 250-250-65 MG tablet Take 2 tablets by mouth every 6 (six) hours as needed for headache.    . cholecalciferol (VITAMIN D3) 25 MCG (1000 UT) tablet Take 1,000 Units by mouth daily.    Marland Kitchen denosumab (PROLIA) 60 MG/ML SOSY injection Inject 60 mg into  the skin every 6 (six) months.     . diphenhydramine-acetaminophen (TYLENOL PM) 25-500 MG TABS tablet Take 2 tablets by mouth at bedtime as needed (sleep).     . docusate sodium (COLACE) 100 MG capsule Take 100 mg by mouth daily.    Marland Kitchen ezetimibe (ZETIA) 10 MG tablet Take 10 mg by mouth every evening.     . furosemide (LASIX) 40 MG tablet TAKE 1 TABLET EVERY DAY 90 tablet 0  . levothyroxine (SYNTHROID) 100 MCG tablet TAKE 1 TABLET EVERY DAY 90 tablet 3  . Magnesium 250 MG TABS Take 250 mg by mouth daily.     . metoprolol (TOPROL-XL) 50 MG 24 hr tablet Take 50 mg by mouth 2 (two) times daily.    Marland Kitchen omeprazole (PRILOSEC) 20 MG capsule Take 1 capsule (20 mg total) by mouth daily. Take 30 minutes before breakfast or dinner. 90 capsule 3  . potassium chloride SA (KLOR-CON) 20 MEQ tablet TAKE 1 TABLET EVERY DAY 90 tablet 3  . REPATHA SURECLICK 580 MG/ML SOAJ Inject 140 mg into the skin every 14 (fourteen) days.    Marland Kitchen spironolactone (ALDACTONE) 25 MG tablet Take 25 mg by mouth 2 (two) times daily.    Marland Kitchen warfarin (COUMADIN) 5 MG tablet TAKE 1 TABLET EVERY DAY EXCEPT TAKE 1/2 TABLET ON FRIDAYS AS DIRECTED 100 tablet 1  . amoxicillin (AMOXIL) 500 MG capsule Take 2,000 mg by mouth as directed. Take 2000 mg 1 hour prior to dental procedures (Patient not taking: No sig reported)     No current facility-administered medications for this visit.    REVIEW OF SYSTEMS:  [X]  denotes positive finding, [ ]  denotes negative finding Cardiac  Comments:  Chest pain or chest pressure:    Shortness of breath upon exertion:    Short of breath when lying flat:    Irregular heart rhythm:        Vascular    Pain in calf, thigh, or hip brought on by ambulation:    Pain in feet at night that wakes you up from your sleep:  Blood clot in your veins:    Leg swelling:  x       Pulmonary    Oxygen at home:    Productive cough:     Wheezing:         Neurologic    Sudden weakness in arms or legs:     Sudden numbness in  arms or legs:     Sudden onset of difficulty speaking or slurred speech:    Temporary loss of vision in one eye:     Problems with dizziness:         Gastrointestinal    Blood in stool:     Vomited blood:         Genitourinary    Burning when urinating:     Blood in urine:        Psychiatric    Major depression:         Hematologic    Bleeding problems:    Problems with blood clotting too easily:        Skin    Rashes or ulcers:        Constitutional    Fever or chills:      PHYSICAL EXAM: Vitals:   10/31/20 1224  BP: (!) 146/79  Pulse: 83  Resp: 14  Temp: 97.7 F (36.5 C)  TempSrc: Temporal  SpO2: (!) 83%  Weight: 130 lb (59 kg)  Height: 5' 3.5" (1.613 m)    GENERAL: The patient is a well-nourished female, in no acute distress. The vital signs are documented above. CARDIAC: There is a regular rate and rhythm.  VASCULAR:  Palpable femoral pulses bilaterally Palpable DP pulses bilaterally PULMONARY: No respiratory distress. ABDOMEN: Soft and non-tender. MUSCULOSKELETAL: There are no major deformities or cyanosis. NEUROLOGIC: No focal weakness or paresthesias are detected. SKIN: There are no ulcers or rashes noted. PSYCHIATRIC: The patient has a normal affect.  DATA:   Lower extremity venous reflux study shows long segment superficial venous reflux bilaterally  Assessment/Plan:  70 year old female presents with bilateral lower extremity venous insufficiency.  She has fairly long segment superficial reflux including SFJ bilaterally.  Discussed the importance of compression and elevation.Ultimately I recommend getting her sized for thigh-high compression stockings 20 to 30 mmHg.  She has previously been wearing knee-high with minimal relief and has a lot of pain in the varicosity behind her left thigh.    She does have potentially an option for endovenous laser ablation with stab phlebectomies but discussed we would re-evaluate this in 3 months after monitored  medical therapy.  I discussed if she does well with thigh-high stockings may be reasonable not to pursue any further intervention given she is on Coumadin with mechanical mitral and aortic valves and taking her off anticoagulation may be high risk.  We will see her in 3 months.   Marty Heck, MD [facility] Office: [phone]

## 2020-11-14 ENCOUNTER — Ambulatory Visit: Payer: Medicare PPO

## 2020-11-15 ENCOUNTER — Ambulatory Visit: Payer: Medicare PPO | Admitting: General Practice

## 2020-11-15 ENCOUNTER — Other Ambulatory Visit: Payer: Self-pay

## 2020-11-15 DIAGNOSIS — Z952 Presence of prosthetic heart valve: Secondary | ICD-10-CM | POA: Diagnosis not present

## 2020-11-15 DIAGNOSIS — Z7901 Long term (current) use of anticoagulants: Secondary | ICD-10-CM

## 2020-11-15 LAB — POCT INR: INR: 2.4 (ref 2.0–3.0)

## 2020-11-15 NOTE — Progress Notes (Signed)
I have reviewed and agree with note, evaluation, plan.   Patricio Popwell, MD  

## 2020-11-15 NOTE — Patient Instructions (Addendum)
Pre visit review using our clinic review tool, if applicable. No additional management support is needed unless otherwise documented below in the visit note.  Take 1 1/2 tablets today and then continue to take 1 tablet daily except 1/2 tablet on Mon and Fridays.  Re-check in 4 weeks.

## 2020-12-05 ENCOUNTER — Inpatient Hospital Stay: Payer: Medicare PPO | Attending: Oncology

## 2020-12-05 ENCOUNTER — Inpatient Hospital Stay: Payer: Medicare PPO | Admitting: Oncology

## 2020-12-05 ENCOUNTER — Other Ambulatory Visit: Payer: Self-pay

## 2020-12-05 VITALS — BP 138/78 | HR 83 | Temp 98.0°F | Resp 18 | Wt 129.7 lb

## 2020-12-05 DIAGNOSIS — Z8571 Personal history of Hodgkin lymphoma: Secondary | ICD-10-CM | POA: Diagnosis not present

## 2020-12-05 DIAGNOSIS — D649 Anemia, unspecified: Secondary | ICD-10-CM | POA: Diagnosis not present

## 2020-12-05 DIAGNOSIS — D509 Iron deficiency anemia, unspecified: Secondary | ICD-10-CM | POA: Insufficient documentation

## 2020-12-05 DIAGNOSIS — D508 Other iron deficiency anemias: Secondary | ICD-10-CM | POA: Insufficient documentation

## 2020-12-05 DIAGNOSIS — Z79899 Other long term (current) drug therapy: Secondary | ICD-10-CM | POA: Insufficient documentation

## 2020-12-05 DIAGNOSIS — Z853 Personal history of malignant neoplasm of breast: Secondary | ICD-10-CM | POA: Diagnosis not present

## 2020-12-05 DIAGNOSIS — D75839 Thrombocytosis, unspecified: Secondary | ICD-10-CM | POA: Diagnosis not present

## 2020-12-05 LAB — CBC WITH DIFFERENTIAL (CANCER CENTER ONLY)
Abs Immature Granulocytes: 0.1 10*3/uL — ABNORMAL HIGH (ref 0.00–0.07)
Basophils Absolute: 0.1 10*3/uL (ref 0.0–0.1)
Basophils Relative: 1 %
Eosinophils Absolute: 0.4 10*3/uL (ref 0.0–0.5)
Eosinophils Relative: 4 %
HCT: 39.6 % (ref 36.0–46.0)
Hemoglobin: 12.5 g/dL (ref 12.0–15.0)
Immature Granulocytes: 1 %
Lymphocytes Relative: 8 %
Lymphs Abs: 0.8 10*3/uL (ref 0.7–4.0)
MCH: 28.3 pg (ref 26.0–34.0)
MCHC: 31.6 g/dL (ref 30.0–36.0)
MCV: 89.6 fL (ref 80.0–100.0)
Monocytes Absolute: 1.5 10*3/uL — ABNORMAL HIGH (ref 0.1–1.0)
Monocytes Relative: 15 %
Neutro Abs: 7.1 10*3/uL (ref 1.7–7.7)
Neutrophils Relative %: 71 %
Platelet Count: 511 10*3/uL — ABNORMAL HIGH (ref 150–400)
RBC: 4.42 MIL/uL (ref 3.87–5.11)
RDW: 15.6 % — ABNORMAL HIGH (ref 11.5–15.5)
WBC Count: 10.1 10*3/uL (ref 4.0–10.5)
nRBC: 0 % (ref 0.0–0.2)

## 2020-12-05 LAB — IRON AND TIBC
Iron: 59 ug/dL (ref 41–142)
Saturation Ratios: 13 % — ABNORMAL LOW (ref 21–57)
TIBC: 450 ug/dL — ABNORMAL HIGH (ref 236–444)
UIBC: 391 ug/dL — ABNORMAL HIGH (ref 120–384)

## 2020-12-05 LAB — FERRITIN: Ferritin: 84 ng/mL (ref 11–307)

## 2020-12-05 NOTE — Progress Notes (Signed)
Hematology and Oncology Follow Up Visit  Grace [name] [id number] 1950-06-10 70 y.o. 12/05/2020 12:57 PM Grace [name], MDHunter, [name], MD   Principle Diagnosis: 70 year old woman with iron deficiency anemia related to poor oral iron absorption presented with a hemoglobin of 10.2 in June 2021   Prior Therapy:   She is status post intravenous iron infusion in the form of Feraheme infusion for a total of 1020 mg completed in June 2021.  She S/P MOPP chemotherapy and mantle field radiation for Hodgkin's disease diagnosed in the 105s.   She status post CMF chemotherapy after bilateral mastectomy and reconstruction for breast cancer in 1992.  Current therapy: Active surveillance.  Interim History: Grace [name] is here for a return evaluation.  Since the last visit, she reports no major changes in her health.  She feels well without any major complaints at this time.  She denies any nausea vomiting or abdominal pain.  She denies any hematochezia, melena or hemoptysis.  She denies any recent hospitalization or illnesses.     Medications: Reviewed without changes. Current Outpatient Medications  Medication Sig Dispense Refill   amoxicillin (AMOXIL) 500 MG capsule Take 2,000 mg by mouth as directed. Take 2000 mg 1 hour prior to dental procedures (Patient not taking: No sig reported)     aspirin 81 MG tablet Take 81 mg by mouth daily.     aspirin-acetaminophen-caffeine (EXCEDRIN MIGRAINE) 250-250-65 MG tablet Take 2 tablets by mouth every 6 (six) hours as needed for headache.     cholecalciferol (VITAMIN D3) 25 MCG (1000 UT) tablet Take 1,000 Units by mouth daily.     denosumab (PROLIA) 60 MG/ML SOSY injection Inject 60 mg into the skin every 6 (six) months.      diphenhydramine-acetaminophen (TYLENOL PM) 25-500 MG TABS tablet Take 2 tablets by mouth at bedtime as needed (sleep).      docusate sodium (COLACE) 100 MG capsule Take 100 mg by mouth daily.     ezetimibe (ZETIA) 10 MG tablet  Take 10 mg by mouth every evening.      furosemide (LASIX) 40 MG tablet TAKE 1 TABLET EVERY DAY 90 tablet 0   levothyroxine (SYNTHROID) 100 MCG tablet TAKE 1 TABLET EVERY DAY 90 tablet 3   Magnesium 250 MG TABS Take 250 mg by mouth daily.      metoprolol (TOPROL-XL) 50 MG 24 hr tablet Take 50 mg by mouth 2 (two) times daily.     omeprazole (PRILOSEC) 20 MG capsule Take 1 capsule (20 mg total) by mouth daily. Take 30 minutes before breakfast or dinner. 90 capsule 3   potassium chloride SA (KLOR-CON) 20 MEQ tablet TAKE 1 TABLET EVERY DAY 90 tablet 3   REPATHA SURECLICK 371 MG/ML SOAJ Inject 140 mg into the skin every 14 (fourteen) days.     spironolactone (ALDACTONE) 25 MG tablet Take 25 mg by mouth 2 (two) times daily.     warfarin (COUMADIN) 5 MG tablet TAKE 1 TABLET EVERY DAY EXCEPT TAKE 1/2 TABLET ON FRIDAYS AS DIRECTED 100 tablet 1   No current facility-administered medications for this visit.     Allergies:  Allergies  Allergen Reactions   Digoxin Other (See Comments)    bradycardia   Statins     myalgia      Physical Exam:  Blood pressure 138/78, pulse 83, temperature 98 F (36.7 C), temperature source Oral, resp. rate 18, weight 129 lb 11.2 oz (58.8 kg), SpO2 98 %.   ECOG:  1  General appearance: Comfortable appearing without any discomfort Head: Normocephalic without any trauma Oropharynx: Mucous membranes are moist and pink without any thrush or ulcers. Eyes: Pupils are equal and round reactive to light. Lymph nodes: No cervical, supraclavicular, inguinal or axillary lymphadenopathy.   Heart:regular rate and rhythm.  S1 and S2 without leg edema. Lung: Clear without any rhonchi or wheezes.  No dullness to percussion. Abdomin: Soft, nontender, nondistended with good bowel sounds.  No hepatosplenomegaly. Musculoskeletal: No joint deformity or effusion.  Full range of motion noted. Neurological: No deficits noted on motor, sensory and deep tendon reflex  exam. Skin: No petechial rash or dryness.  Appeared moist.      Lab Results: Lab Results  Component Value Date   WBC 9.3 10/02/2020   HGB 12.3 10/02/2020   HCT 37.0 10/02/2020   MCV 89.4 10/02/2020   PLT 453.0 (H) 10/02/2020     Chemistry      Component Value Date/Time   NA 138 10/02/2020 0838   K 3.8 10/02/2020 0838   CL 100 10/02/2020 0838   CO2 30 10/02/2020 0838   BUN 24 (H) 10/02/2020 0838   CREATININE 1.21 (H) 10/02/2020 0838   CREATININE 1.32 (H) 04/03/2020 0856      Component Value Date/Time   CALCIUM 10.1 10/02/2020 0838   ALKPHOS 75 10/02/2020 0838   AST 32 10/02/2020 0838   ALT 26 10/02/2020 0838   BILITOT 0.5 10/02/2020 0838       Impression and Plan:   70 year old woman with:   1.   Anemia related to iron deficiency and poor absorption diagnosed in June 2021.    She is status post intravenous iron infusion completed in June 2021 with normalization of her hemoglobin since that time.  CBC on Oct 02, 2020 showed a hemoglobin of 12.3 with mild elevation in her platelet count of 453.  Iron studies on May 9 showed iron level of 72 with ferritin of 44.  Saturation is slightly down to 14.8.   Treatment options moving forward were discussed.  Repeat intravenous iron versus oral iron replacement were discussed.  At this time, her hemoglobin has normalized and does not require any intervention.  Oral iron replacement may be needed in the future if she developed deficiency.   2.  Hodgkin's disease: No evidence of relapsed disease.  Her CBC does not show any evidence of treatment related changes.   3.  Breast cancer diagnosed in 1992 currently in remission: She is up-to-date on her mammography with next mammogram will be in November 2022.  4.  Thrombocytosis: Likely reactive from her previous treatment history without any evidence of a hematological disorder.     5.  Follow-up: I am happy to see her in the future as needed.  She gets routine laboratory testing by  her primary care physician and she prefers to return as needed.   30  minutes were dedicated to this visit.  The time was spent on reviewing laboratory data, disease status discussion, treatment choices and future plan of care review.       Zola Button, MD 7/12/202212:57 PM

## 2020-12-12 ENCOUNTER — Ambulatory Visit: Payer: Medicare PPO | Admitting: General Practice

## 2020-12-12 ENCOUNTER — Other Ambulatory Visit: Payer: Self-pay

## 2020-12-12 DIAGNOSIS — Z7901 Long term (current) use of anticoagulants: Secondary | ICD-10-CM | POA: Diagnosis not present

## 2020-12-12 DIAGNOSIS — Z952 Presence of prosthetic heart valve: Secondary | ICD-10-CM | POA: Diagnosis not present

## 2020-12-12 LAB — POCT INR: INR: 2.8 (ref 2.0–3.0)

## 2020-12-12 NOTE — Patient Instructions (Addendum)
Pre visit review using our clinic review tool, if applicable. No additional management support is needed unless otherwise documented below in the visit note.  Continue to take 1 tablet daily except 1/2 tablet on Mon and Fridays.  Re-check in 4 weeks.

## 2020-12-12 NOTE — Progress Notes (Signed)
Medical screening examination/treatment/procedure(s) were performed by non-physician practitioner and as supervising physician I was immediately available for consultation/collaboration. I agree with above. Richardine Peppers, MD   

## 2021-01-23 ENCOUNTER — Ambulatory Visit: Payer: Medicare PPO

## 2021-01-23 ENCOUNTER — Other Ambulatory Visit: Payer: Self-pay

## 2021-01-23 DIAGNOSIS — Z7901 Long term (current) use of anticoagulants: Secondary | ICD-10-CM

## 2021-01-23 LAB — POCT INR: INR: 3 (ref 2.0–3.0)

## 2021-01-23 NOTE — Patient Instructions (Addendum)
Pre visit review using our clinic review tool, if applicable. No additional management support is needed unless otherwise documented below in the visit note.  Continue to take 1 tablet daily except 1/2 tablet on Mon and Fridays.  Re-check in 4 weeks.

## 2021-01-23 NOTE — Progress Notes (Signed)
Medical screening examination/treatment/procedure(s) were performed by non-physician practitioner and as supervising physician I was immediately available for consultation/collaboration. I agree with above. Tonia Avino, MD   

## 2021-02-08 ENCOUNTER — Ambulatory Visit: Payer: Medicare PPO | Admitting: Vascular Surgery

## 2021-02-08 ENCOUNTER — Other Ambulatory Visit: Payer: Self-pay | Admitting: Family Medicine

## 2021-02-12 DIAGNOSIS — H43812 Vitreous degeneration, left eye: Secondary | ICD-10-CM | POA: Diagnosis not present

## 2021-02-19 ENCOUNTER — Telehealth: Payer: Self-pay

## 2021-02-19 MED ORDER — FUROSEMIDE 40 MG PO TABS: 40.0000 mg | ORAL_TABLET | Freq: Every day | ORAL | 0 refills | Status: DC

## 2021-02-19 NOTE — Telephone Encounter (Signed)
MEDICATION: furosemide (LASIX) 40 MG tablet  PHARMACY:  [place], [place] Phone:  [phone]  Fax:  [phone]      Comments:   **Let patient know to contact pharmacy at the end of the day to make sure medication is ready. **  ** Please notify patient to allow 48-72 hours to process**  **Encourage patient to contact the pharmacy for refills or they can request refills through [facility]**

## 2021-02-19 NOTE — Telephone Encounter (Signed)
Refill sent.

## 2021-02-20 ENCOUNTER — Ambulatory Visit (INDEPENDENT_AMBULATORY_CARE_PROVIDER_SITE_OTHER): Payer: Medicare PPO

## 2021-02-20 ENCOUNTER — Other Ambulatory Visit: Payer: Self-pay

## 2021-02-20 DIAGNOSIS — Z7901 Long term (current) use of anticoagulants: Secondary | ICD-10-CM | POA: Diagnosis not present

## 2021-02-20 LAB — POCT INR: INR: 3 (ref 2.0–3.0)

## 2021-02-20 NOTE — Patient Instructions (Addendum)
Pre visit review using our clinic review tool, if applicable. No additional management support is needed unless otherwise documented below in the visit note.  Continue to take 1 tablet daily except 1/2 tablet on Mon and Fridays.  Re-check in 4 weeks.

## 2021-02-20 NOTE — Progress Notes (Signed)
I have reviewed and agree with note, evaluation, plan.   Kensli Bowley, MD  

## 2021-02-21 ENCOUNTER — Encounter: Payer: Self-pay | Admitting: Vascular Surgery

## 2021-02-21 ENCOUNTER — Ambulatory Visit: Payer: Medicare PPO | Admitting: Vascular Surgery

## 2021-02-21 VITALS — BP 150/87 | HR 78 | Temp 98.3°F | Resp 18 | Ht 63.5 in | Wt 130.3 lb

## 2021-02-21 DIAGNOSIS — I872 Venous insufficiency (chronic) (peripheral): Secondary | ICD-10-CM

## 2021-02-21 NOTE — Progress Notes (Signed)
REASON FOR VISIT:   Follow-up of chronic venous insufficiency  MEDICAL ISSUES:   CHRONIC VENOUS INSUFFICIENCY: This patient does have symptoms from venous hypertension.  She has currently CEAP C1 venous disease.  We have again discussed the importance of intermittent leg elevation and the proper positioning for this.  I have recommended a knee-high compression stocking with a gradient of 15 to 20 mmHg which I think may be more practical for everyday use.  I have encouraged her to avoid prolonged sitting and standing.  We have discussed the importance of exercise specifically walking and water aerobics.  Currently I am not sure that addressing the reflux in her proximal great saphenous veins or the left small saphenous vein would have a significant impact on her symptoms and she is agreeable.  She does not want to pursue aggressive approach unless her symptoms progress significantly.  She will call if her symptoms progress.  HPI:   Grace [name] is a pleasant 70 y.o. female who was seen by Dr. Carlis Abbott on 10/31/2020 with chronic venous insufficiency.  She has multiple medical issues including a history of Hodgkin's lymphoma and is status post radiation therapy and chemotherapy.  She had an aortic and mitral valve replacement and is on Coumadin.  In addition she has coronary artery disease and stage III chronic kidney disease.  She had painful varicose veins of both lower extremities.  Her symptoms are gotten worse over the last several years.  On exam she had palpable dorsalis pedis pulses bilaterally.  Her noninvasive study showed reflux in both great saphenous veins.  She was encouraged to elevate her legs and was prescribed thigh-high compression stockings with a gradient of 20 to 30 mmHg.  Since she was seen last she continues to have some aching pain and heaviness in both lower extremities.  The symptoms are aggravated by sitting and standing and relieved with elevation.  She has tried her  thigh-high compression stockings which do help some.  She denies significant leg swelling.   Past Medical History:  Diagnosis Date   Anemia    Aortic insufficiency    AORTIC VALVE REPLACEMENT, HX OF 04/27/2007   Qualifier: Diagnosis of  By: Leanne Chang MD, Bruce     Cancer [facility])    left breast- surgery and chemo   CHF (congestive heart failure) ([place])    Chronic kidney disease    Stage 3   Congestive heart failure ([place]) 08/03/2009   [facility]. [facility]. Last echo 2010 with EF 30%. Restrictive due to radiation. AVR MVR. 02/2014 visit planned.     Coronary artery disease    11/06/06 [facility]): 40% oLM, 60% dLAD, 30% oRCA   Dyspnea    with exertion   Dysrhythmia    paroxsymal a-flutter 2018; intermittent left BBB (2016)   Edema of both feet    GI bleed    Heart murmur    mitral and aortic valve replacement   Hodgkin's disease    ~1974, s/p [place] radiation and chemo   Hyperlipidemia    Hypertension    Hypothyroidism    Migraine    none since 2008   MITRAL VALVE REPLACEMENT, HX OF 04/27/2007   Qualifier: Diagnosis of  By: Leanne Chang MD, Bruce     Pericarditis    s/p pericardidal stripping through lateral thoracotomy 01/2007 [facility])   Stroke ([place]) 2008   No residual effects   TIA (transient ischemic attack)    Tricuspid regurgitation    Varicose veins of both  lower extremities    left worse than right- have gotten larger in April 2020   WPW (Wolff-Parkinson-White syndrome)     Family History  Problem Relation Age of Onset   Heart disease Mother    Cancer Mother        breast   Other Father        hunting gun accident   Heart disease Maternal Aunt    Cancer Maternal Aunt        uterine   Heart disease Maternal Grandmother    Diabetes Maternal Grandmother    Hypercalcemia Neg Hx    Colon cancer Neg Hx    Esophageal cancer Neg Hx    Liver disease Neg Hx    Pancreatic cancer Neg Hx    Rectal cancer Neg Hx    Stomach cancer Neg Hx     SOCIAL HISTORY: Social History    Tobacco Use   Smoking status: Never   Smokeless tobacco: Never  Substance Use Topics   Alcohol use: No    Allergies  Allergen Reactions   Digoxin Other (See Comments)    bradycardia   Statins     myalgia    Current Outpatient Medications  Medication Sig Dispense Refill   amoxicillin (AMOXIL) 500 MG capsule Take 2,000 mg by mouth as directed. Take 2000 mg 1 hour prior to dental procedures     aspirin 81 MG tablet Take 81 mg by mouth daily.     aspirin-acetaminophen-caffeine (EXCEDRIN MIGRAINE) 250-250-65 MG tablet Take 2 tablets by mouth every 6 (six) hours as needed for headache.     cholecalciferol (VITAMIN D3) 25 MCG (1000 UT) tablet Take 1,000 Units by mouth daily.     denosumab (PROLIA) 60 MG/ML SOSY injection Inject 60 mg into the skin every 6 (six) months.      diphenhydramine-acetaminophen (TYLENOL PM) 25-500 MG TABS tablet Take 2 tablets by mouth at bedtime as needed (sleep).      docusate sodium (COLACE) 100 MG capsule Take 100 mg by mouth daily.     ezetimibe (ZETIA) 10 MG tablet Take 10 mg by mouth every evening.      furosemide (LASIX) 40 MG tablet Take 1 tablet (40 mg total) by mouth daily. 90 tablet 0   levothyroxine (SYNTHROID) 100 MCG tablet TAKE 1 TABLET EVERY DAY 90 tablet 3   Magnesium 250 MG TABS Take 250 mg by mouth daily.      metoprolol (TOPROL-XL) 50 MG 24 hr tablet Take 50 mg by mouth 2 (two) times daily.     potassium chloride SA (KLOR-CON) 20 MEQ tablet TAKE 1 TABLET EVERY DAY 90 tablet 3   REPATHA SURECLICK 578 MG/ML SOAJ Inject 140 mg into the skin every 14 (fourteen) days.     spironolactone (ALDACTONE) 25 MG tablet Take 25 mg by mouth 2 (two) times daily.     warfarin (COUMADIN) 5 MG tablet TAKE 1 TABLET EVERY DAY EXCEPT TAKE 1/2 TABLET ON FRIDAYS AS DIRECTED 84 tablet 3   omeprazole (PRILOSEC) 20 MG capsule Take 1 capsule (20 mg total) by mouth daily. Take 30 minutes before breakfast or dinner. 90 capsule 3   No current facility-administered  medications for this visit.    REVIEW OF SYSTEMS:  [X]  denotes positive finding, [ ]  denotes negative finding Cardiac  Comments:  Chest pain or chest pressure:    Shortness of breath upon exertion: x   Short of breath when lying flat:    Irregular heart rhythm:  Vascular    Pain in calf, thigh, or hip brought on by ambulation:    Pain in feet at night that wakes you up from your sleep:     Blood clot in your veins:    Leg swelling:         Pulmonary    Oxygen at home:    Productive cough:     Wheezing:         Neurologic    Sudden weakness in arms or legs:     Sudden numbness in arms or legs:     Sudden onset of difficulty speaking or slurred speech:    Temporary loss of vision in one eye:     Problems with dizziness:         Gastrointestinal    Blood in stool:     Vomited blood:         Genitourinary    Burning when urinating:     Blood in urine:        Psychiatric    Major depression:         Hematologic    Bleeding problems:    Problems with blood clotting too easily:        Skin    Rashes or ulcers:        Constitutional    Fever or chills:     PHYSICAL EXAM:   Vitals:   02/21/21 1512  BP: (!) 150/87  Pulse: 78  Resp: 18  Temp: 98.3 F (36.8 C)  TempSrc: Temporal  SpO2: 100%  Weight: 130 lb 4.8 oz (59.1 kg)  Height: 5' 3.5" (1.613 m)    GENERAL: The patient is a well-nourished female, in no acute distress. The vital signs are documented above. CARDIAC: There is a regular rate and rhythm.  VASCULAR: I do not detect carotid bruits. She has palpable dorsalis pedis pulses bilaterally. She has spider veins in both lower extremities as documented in the photographs below.         I did look at both great saphenous veins myself with the Son[employer].  She has reflux in the proximal to mid thigh and the vein is dilated.  On both sides we would likely cannulate the vein in the mid thigh where it gives off a superficial tributary.  She did  have an anterior accessory saphenous vein I believe on the left side.  This was not especially dilated.  I also looked at the left small saphenous vein and obtain significantly larger measurements that we obtained in June.  The diet vein was dilated up to 6-1/2 mm.  She had a vein of Giacomini with a connection to the popliteal vein.  PULMONARY: There is good air exchange bilaterally without wheezing or rales. ABDOMEN: Soft and non-tender with normal pitched bowel sounds.  MUSCULOSKELETAL: There are no major deformities or cyanosis. NEUROLOGIC: No focal weakness or paresthesias are detected. SKIN: There are no ulcers or rashes noted. PSYCHIATRIC: The patient has a normal affect.  DATA:    VENOUS DUPLEX: I have reviewed her venous duplex scan from 10/31/2020.  On the right side she had no evidence of DVT.  She had no significant deep venous reflux.  She had superficial venous reflux in the right great saphenous vein from the saphenofemoral junction to the mid thigh.  The vein was dilated up to 5 mm in the proximal thigh.  There was also reflux in the small saphenous vein to the mid calf although the vein was not  especially dilated.     On the left side, there was no evidence of DVT.  There was no significant deep venous reflux.  There was superficial venous reflux in the great saphenous vein in the thigh with diameters up to 6.2 mm.  There was also reflux in the small saphenous vein to the mid calf with diameters ranging from 5.2 to 5.4 mm.      Deitra Mayo [facility] [phone]

## 2021-03-03 NOTE — Telephone Encounter (Signed)
Prolia VOB initiated via [email or web address]  Last OV:  Next OV:  Last Prolia inj: 10/17/20 Next Prolia inj DUE: 04/20/21

## 2021-03-07 ENCOUNTER — Other Ambulatory Visit: Payer: Self-pay

## 2021-03-07 ENCOUNTER — Encounter: Payer: Self-pay | Admitting: Family Medicine

## 2021-03-07 DIAGNOSIS — M810 Age-related osteoporosis without current pathological fracture: Secondary | ICD-10-CM

## 2021-03-07 NOTE — Telephone Encounter (Signed)
Pt ready for scheduling on or after 04/20/21  Out-of-pocket cost due at time of visit: $0.00  Primary: [employer] Prolia co-insurance: 0% Admin fee co-insurance: $0  Secondary: n/a Prolia co-insurance:  Admin fee co-insurance:   Deductible: does not apply  PA APPROVED PA# [id number] Valid 09/29/19-05/26/21    ** This summary of benefits is an estimation of the patient's out-of-pocket cost. Exact cost may very based on individual plan coverage.

## 2021-03-13 DIAGNOSIS — Z85828 Personal history of other malignant neoplasm of skin: Secondary | ICD-10-CM | POA: Diagnosis not present

## 2021-03-13 DIAGNOSIS — C44319 Basal cell carcinoma of skin of other parts of face: Secondary | ICD-10-CM | POA: Diagnosis not present

## 2021-03-13 DIAGNOSIS — B078 Other viral warts: Secondary | ICD-10-CM | POA: Diagnosis not present

## 2021-03-13 DIAGNOSIS — C44619 Basal cell carcinoma of skin of left upper limb, including shoulder: Secondary | ICD-10-CM | POA: Diagnosis not present

## 2021-03-13 DIAGNOSIS — Z08 Encounter for follow-up examination after completed treatment for malignant neoplasm: Secondary | ICD-10-CM | POA: Diagnosis not present

## 2021-03-13 DIAGNOSIS — D485 Neoplasm of uncertain behavior of skin: Secondary | ICD-10-CM | POA: Diagnosis not present

## 2021-03-13 DIAGNOSIS — L814 Other melanin hyperpigmentation: Secondary | ICD-10-CM | POA: Diagnosis not present

## 2021-03-13 DIAGNOSIS — Z86007 Personal history of in-situ neoplasm of skin: Secondary | ICD-10-CM | POA: Diagnosis not present

## 2021-03-13 DIAGNOSIS — L218 Other seborrheic dermatitis: Secondary | ICD-10-CM | POA: Diagnosis not present

## 2021-03-13 DIAGNOSIS — D225 Melanocytic nevi of trunk: Secondary | ICD-10-CM | POA: Diagnosis not present

## 2021-03-13 DIAGNOSIS — L821 Other seborrheic keratosis: Secondary | ICD-10-CM | POA: Diagnosis not present

## 2021-03-13 DIAGNOSIS — C44519 Basal cell carcinoma of skin of other part of trunk: Secondary | ICD-10-CM | POA: Diagnosis not present

## 2021-03-20 ENCOUNTER — Other Ambulatory Visit: Payer: Self-pay

## 2021-03-20 ENCOUNTER — Ambulatory Visit: Payer: Medicare PPO

## 2021-03-20 DIAGNOSIS — Z7901 Long term (current) use of anticoagulants: Secondary | ICD-10-CM

## 2021-03-20 LAB — POCT INR: INR: 3.3 — AB (ref 2.0–3.0)

## 2021-03-20 NOTE — Patient Instructions (Addendum)
Pre visit review using our clinic review tool, if applicable. No additional management support is needed unless otherwise documented below in the visit note.  Continue to take 1 tablet daily except 1/2 tablet on Mon and Fridays.  Re-check in 4 weeks.

## 2021-03-20 NOTE — Progress Notes (Signed)
I have reviewed and agree with note, evaluation, plan.   Jlynn Ly, MD  

## 2021-03-27 NOTE — Progress Notes (Signed)
Phone [phone]   Subjective:  Patient presents today for their annual physical. Chief complaint-noted.   See problem oriented charting- ROS- full  review of systems was completed and negative except for: muscle aches, seasonal allergies  The following were reviewed and entered/updated in epic: Past Medical History:  Diagnosis Date   Anemia    Aortic insufficiency    AORTIC VALVE REPLACEMENT, HX OF 04/27/2007   Qualifier: Diagnosis of  By: Leanne Chang MD, Bruce     Cancer [facility])    left breast- surgery and chemo   CHF (congestive heart failure) ([place])    Chronic kidney disease    Stage 3   Congestive heart failure ([place]) 08/03/2009   [facility] Dr. Paul Half. Last echo 2010 with EF 30%. Restrictive due to radiation. AVR MVR. 02/2014 visit planned.     Coronary artery disease    11/06/06 [facility]): 40% oLM, 60% dLAD, 30% oRCA   Dyspnea    with exertion   Dysrhythmia    paroxsymal a-flutter 2018; intermittent left BBB (2016)   Edema of both feet    GI bleed    Heart murmur    mitral and aortic valve replacement   Hodgkin's disease    ~1974, s/p [place] radiation and chemo   Hyperlipidemia    Hypertension    Hypothyroidism    Migraine    none since 2008   MITRAL VALVE REPLACEMENT, HX OF 04/27/2007   Qualifier: Diagnosis of  By: Leanne Chang MD, Bruce     Pericarditis    s/p pericardidal stripping through lateral thoracotomy 01/2007 [facility])   Stroke ([place]) 2008   No residual effects   TIA (transient ischemic attack)    Tricuspid regurgitation    Varicose veins of both lower extremities    left worse than right- have gotten larger in April 2020   WPW (Wolff-Parkinson-White syndrome)    Patient Active Problem List   Diagnosis Date Noted   Atrial flutter ([place]) 12/20/2016    Priority: High   Osteoporosis 01/17/2014    Priority: High   H/O aortic valve replacement and mitral valve replacement. 01/07/2013    Priority: High   WOLFF (WOLFE)-PARKINSON-WHITE (WPW) SYNDROME 08/16/2009     Priority: High   Congestive heart failure ([place]) 08/03/2009    Priority: High   TRANSIENT ISCHEMIC ATTACKS, HX OF 04/27/2007    Priority: High   History of Hodgkin's disease 11/03/2006    Priority: High   History of colonic polyps 02/26/2019    Priority: Medium    Hypertension 12/20/2016    Priority: Medium    CKD (chronic kidney disease), stage III ([place]) 01/18/2014    Priority: Medium    Iron deficiency anemia 08/16/2009    Priority: Medium    Hyperlipidemia 08/29/2008    Priority: Medium    Hypothyroidism 11/03/2006    Priority: Medium    COMMON MIGRAINE 11/03/2006    Priority: Medium    BREAST CANCER, HX OF 11/03/2006    Priority: Medium    Long term (current) use of anticoagulants 12/23/2012    Priority: Low   GASTROINTESTINAL HEMORRHAGE, HX OF 11/03/2006    Priority: Low   Chronic venous insufficiency 10/31/2020   Anemia 10/28/2019   Other hemorrhoids 09/18/2019   Diverticulosis of colon without hemorrhage 09/18/2019   Esophagitis determined by endoscopy 09/18/2019   Schatzki's ring 09/18/2019   Hiatal hernia 09/18/2019   Acute left-sided low back pain without sciatica 01/08/2019   Thoracic compression fracture, with delayed healing, subsequent encounter 12/10/2018  Compression fracture of thoracic spine, non-traumatic, initial encounter ([place]) 11/09/2018   Acute midline thoracic back pain 11/03/2018   Sacroiliac pain 10/12/2018   Baker cyst, left 10/12/2018   Microcytic anemia 09/17/2018   Hx of long-term (current) use of anticoagulants 07/22/2018   Hypercalcemia 12/23/2017   Breast cancer ([place]) 08/09/2011   Hodgkin's lymphoma ([place]) 08/09/2011   Moderate tricuspid regurgitation 08/01/2011   CAD (coronary artery disease) 07/21/2006   DOE (dyspnea on exertion) 07/21/2006   Past Surgical History:  Procedure Laterality Date   AORTIC VALVE SURGERY     19 mm mechanical [facility] AVR 05/2006 ([facility], Dr. Lajuana Matte)   bilateral breast implants     BIOPSY  02/22/2019    Procedure: BIOPSY;  Surgeon: Irving Copas., MD;  Location: [facility] ENDOSCOPY;  Service: Gastroenterology;;   COLONOSCOPY WITH PROPOFOL N/A 02/22/2019   Procedure: COLONOSCOPY WITH PROPOFOL;  Surgeon: Irving Copas., MD;  Location: [place];  Service: Gastroenterology;  Laterality: N/A;   ENDOMETRIAL ABLATION     ENDOSCOPIC MUCOSAL RESECTION  01/10/2020   Procedure: ENDOSCOPIC MUCOSAL RESECTION;  Surgeon: Rush Landmark Telford Nab., MD;  Location: [place];  Service: Gastroenterology;;   ENTEROSCOPY N/A 02/22/2019   Procedure: ENTEROSCOPY;  Surgeon: Rush Landmark Telford Nab., MD;  Location: [place];  Service: Gastroenterology;  Laterality: N/A;   ESOPHAGOGASTRODUODENOSCOPY (EGD) WITH PROPOFOL N/A 01/10/2020   Procedure: ESOPHAGOGASTRODUODENOSCOPY (EGD) WITH PROPOFOL;  Surgeon: Rush Landmark Telford Nab., MD;  Location: [place];  Service: Gastroenterology;  Laterality: N/A;   EXPLORATORY LAPAROTOMY     HEMOSTASIS CLIP PLACEMENT  02/22/2019   Procedure: HEMOSTASIS CLIP PLACEMENT;  Surgeon: Irving Copas., MD;  Location: [place];  Service: Gastroenterology;;   HEMOSTASIS CLIP PLACEMENT  01/10/2020   Procedure: HEMOSTASIS CLIP PLACEMENT;  Surgeon: Irving Copas., MD;  Location: [place];  Service: Gastroenterology;;   KYPHOPLASTY N/A 12/10/2018   Procedure: KYPHOPLASTY THORACIC SIX- THORACIC SEVEN;  Surgeon: Newman Pies, MD;  Location: [place];  Service: Neurosurgery;  Laterality: N/A;  KYPHOPLASTY THORACIC SIX- THORACIC SEVEN   MASTECTOMY     MITRAL VALVE REPLACEMENT     25 mm [employer] [facility] MVR, 05/2006, Dr. Lajuana Matte, [place]   pericardial stripping  9/08   POLYPECTOMY  02/22/2019   Procedure: POLYPECTOMY;  Surgeon: Mansouraty, Telford Nab., MD;  Location: [place];  Service: Gastroenterology;;   SPLENECTOMY     ~ [place] INJECTION  01/10/2020   Procedure: SUBMUCOSAL LIFTING INJECTION;  Surgeon: Irving Copas., MD;  Location: [place];  Service: Gastroenterology;;   SUBMUCOSAL TATTOO INJECTION  02/22/2019   Procedure: SUBMUCOSAL TATTOO INJECTION;  Surgeon: Irving Copas., MD;  Location: [place];  Service: Gastroenterology;;   THORACOTOMY     for pericardial stripping 01/2007 (Dr. Elenor Quinones, [facility])   TONSILLECTOMY     valvulopathy  06/13/06    Family History  Problem Relation Age of Onset   Heart disease Mother    Cancer Mother        breast   Other Father        hunting gun accident   Heart disease Maternal Aunt    Cancer Maternal Aunt        uterine   Heart disease Maternal Grandmother    Diabetes Maternal Grandmother    Hypercalcemia Neg Hx    Colon cancer Neg Hx    Esophageal cancer Neg Hx    Liver disease Neg Hx    Pancreatic cancer Neg Hx  Rectal cancer Neg Hx    Stomach cancer Neg Hx     Medications- reviewed and updated Current Outpatient Medications  Medication Sig Dispense Refill   amoxicillin (AMOXIL) 500 MG capsule Take 2,000 mg by mouth as directed. Take 2000 mg 1 hour prior to dental procedures     aspirin 81 MG tablet Take 81 mg by mouth daily.     aspirin-acetaminophen-caffeine (EXCEDRIN MIGRAINE) 250-250-65 MG tablet Take 2 tablets by mouth every 6 (six) hours as needed for headache.     cholecalciferol (VITAMIN D3) 25 MCG (1000 UT) tablet Take 1,000 Units by mouth daily.     denosumab (PROLIA) 60 MG/ML SOSY injection Inject 60 mg into the skin every 6 (six) months.      diphenhydramine-acetaminophen (TYLENOL PM) 25-500 MG TABS tablet Take 2 tablets by mouth at bedtime as needed (sleep).      docusate sodium (COLACE) 100 MG capsule Take 100 mg by mouth daily.     ezetimibe (ZETIA) 10 MG tablet Take 10 mg by mouth every evening.      furosemide (LASIX) 40 MG tablet Take 1 tablet (40 mg total) by mouth daily. 90 tablet 0   levothyroxine (SYNTHROID) 100 MCG tablet TAKE 1 TABLET EVERY DAY 90 tablet 3   Magnesium 250 MG TABS Take 250 mg by  mouth daily.      metoprolol (TOPROL-XL) 50 MG 24 hr tablet Take 50 mg by mouth 2 (two) times daily.     potassium chloride SA (KLOR-CON) 20 MEQ tablet TAKE 1 TABLET EVERY DAY 90 tablet 3   REPATHA SURECLICK 678 MG/ML SOAJ Inject 140 mg into the skin every 14 (fourteen) days.     spironolactone (ALDACTONE) 25 MG tablet Take 25 mg by mouth 2 (two) times daily.     warfarin (COUMADIN) 5 MG tablet TAKE 1 TABLET EVERY DAY EXCEPT TAKE 1/2 TABLET ON FRIDAYS AS DIRECTED 84 tablet 3   omeprazole (PRILOSEC) 20 MG capsule Take 1 capsule (20 mg total) by mouth daily. Take 30 minutes before breakfast or dinner. 90 capsule 3   No current facility-administered medications for this visit.    Allergies-reviewed and updated Allergies  Allergen Reactions   Digoxin Other (See Comments)    bradycardia   Statins     myalgia    Social History   Social History Narrative   Lived in [place] for 18 years. Moved here for the weather.    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      Husband retired January 2015. Married 1991 (2nd marriage). No kids.       Hobbies: walks 2 miles per day, read, work outside         Objective  Objective:  BP 110/80   Pulse 67   Temp 97.6 F (36.4 C)   Ht 5' 3.5" (1.613 m)   Wt 130 lb 9.6 oz (59.2 kg)   SpO2 97%   BMI 22.77 kg/m  Gen: NAD, resting comfortably HEENT: Mucous membranes are moist. Oropharynx normal Neck: no thyromegaly CV: RRR stable mechanical heart sounds.  Lungs: CTAB no crackles, wheeze, rhonchi Abdomen: soft/nontender/nondistended/normal bowel sounds. No rebound or guarding.  Ext: no edema Skin: warm, dry Neuro: grossly normal, moves all extremities, PERRLA   Assessment and Plan   70 y.o. female presenting for annual physical.  Health Maintenance counseling: 1. Anticipatory guidance: Patient counseled regarding regular dental exams -q6 months, eye exams - yearly,  avoiding  smoking and second hand smoke , limiting alcohol to 1 beverage per day- doesn't drink . No illicit drugs.    2. Risk factor reduction:  Advised patient of need for regular exercise and diet rich and fruits and vegetables to reduce risk of heart attack and stroke. Exercise- did some treadmill in spring- then was active in the yard for summer and may go back as things cool off and less leaf work. Diet- reasonably healthy diet- thankful no weight loss.  Wt Readings from Last 3 Encounters:  04/04/21 130 lb 9.6 oz (59.2 kg)  02/21/21 130 lb 4.8 oz (59.1 kg)  12/05/20 129 lb 11.2 oz (58.8 kg)  3. Immunizations/screenings/ancillary studies DISCUSSED:  -COVID booster vac #5- bivalent done recently team will log -Flu vac (last one 11/21) - high dose today Immunization History  Administered Date(s) Administered   Fluad Quad(high Dose 65+) 02/26/2019, 04/03/2020, 04/04/2021   Influenza Split 03/21/2011, 03/24/2012   Influenza Whole 03/16/2008, 04/27/2009, 03/01/2010   Influenza, High Dose Seasonal PF 02/28/2016, 03/14/2017, 02/27/2018   Influenza,inj,Quad PF,6+ Mos 03/12/2013, 03/15/2014, 03/14/2015   Influenza-Unspecified 02/24/2014, 03/14/2015, 02/26/2019   Moderna Covid-19 Vaccine Bivalent Booster 69yrs & up 02/07/2021   Moderna Sars-Covid-2 Vaccination 07/10/2019, 08/07/2019, 03/26/2020, 10/06/2020   Pneumococcal Conjugate-13 03/26/2013   Pneumococcal Polysaccharide-23 02/25/2003, 10/19/2015   Td 06/24/2002, 03/26/2013   Tdap 03/26/2013   Zoster Recombinat (Shingrix) 12/08/2019, 02/08/2020   Zoster, Live 04/24/2011  4. Cervical cancer screening- last pap November 2017 and was told normal - prior normal as well per her report- past age based screening requirement 5. Breast cancer screening-  bilateral mastectomy 1991 with implants.   Breast exam with gynecology.  and mammogram 03/27/20 with [facility]- just had 2 days ago- and we will await records 6. Colon cancer screening - colonoscopy 02/22/19 with  possible 3 year repeat with tubular adenoma 7. Skin cancer screening- Lennie Odor with [facility] at least yearly. Skin surgery center as needed- has upcoming procedure. advised regular sunscreen use.  8. Birth control/STD check- ablation/monogamous /postmenopausal 9. Osteoporosis  DEXA 03/22/19- just had this done we are waiting on results- following with Dr. Cruzita Lederer -Never smoker- no lung cancer screening needed  Status of chronic or acute concerns   #History of Hodgkin's disease in 1970s-completed  extensive chemotherapy and radiation. Dr. Alen Blew has released her   #Cardiovascular issues followed by [facility] cardiology-  S: cardiologist at [facility] will be moving to [facility]- she will be with Dr. Joyice Faster (new cardiologist after prior doctor transitioned to [facility]- visit upcoming march 3rd) 1.  History of Wolff-Parkinson-White syndrome 2.  History of aortic and mitral valve replacement mechanical-on long-term Coumadin followed at our clinic.Stable, denies issues with Coumadin 3.  Atrial fibrillation-on Coumadin for anticoagulation  (appropriately anticoagulated) and metoprolol for rate control- appropriately controlled.  4.  Restrictive CHF 01/05/2019 EF up to 50%-stable shortness of breath with stairs.  Compliant with Lasix 40 mg daily and spironolactone 25 mg daily.  Denied worsened edema or shortness of breath-- stable still.  Required potassium supplement despite spironolactone 5. moderate distal coronary disease by cath 2008- on asa in addition to Coumadin. Also on repatha A/P: all CV issues appear stable and has appropriate cardiology follow up . Continue current meds   #hypothyroidism S: compliant On thyroid medication-levothyroxine 112 mcg--> 100 mcg Lab Results  Component Value Date   TSH 0.35 10/02/2020   A/P:hopefully stable- update tsh today. Continue current meds for now   #CKD stage III S: Patient knows to avoid NSAIDs.  Blood pressure well controlled  .  GFR in the 50s most  frequently-on last check was in the 40s A/P: hopefully stable or improved- update cmp today. Continue current meds for now  #Osteoporosis S:Patient had transitioned care to Dr. Cruzita Lederer.  She had been started on Prolia (started November 5th 2020)  due to worsened bone density in late 2020 along with compression fracture t6-t7 (did well with kyphoplasty) .  She was compliant with calcium and vitamin D.  Previously had been on Boniva for over 5 years. A/P: managed by Dr. Cruzita Lederer- on prolia. prolia 10/17/20 and scheduled 04/24/21 -check vitamin D   #hyperlipidemia/statin intolerant S: compliant with Zetia 10 mg and with new start of repatha due to being  Statin intolerant -History of TIA as well as distal CAD so  ideally should target LDL under 70 Lab Results  Component Value Date   CHOL 92 11/17/2019   HDL 59 11/17/2019   LDLCALC 7 11/17/2019   LDLDIRECT 68.0 04/28/2018   TRIG 128 11/17/2019   CHOLHDL 1.6 11/17/2019   A/P: hopefully stable- update lipid panel today (LDL has been below 70- repatha really helpful). Continue current meds for now  #History of iron deficiency anemia- hematology had stopped her iron and released her-. Polypoid lesion removed on EGD in past.  - denied straining but twice had blood in stool over the recent weeks before 05/22 visit.. Last colonoscopy 02/22/2019. Suspect internal hemorrhoids. Had felt slightly more tired.  -had used compression stockings in the past- completed.  -Continued coumadin and aspirin   #varicose veins -has seen vascular surgery 2022. 15-85mmhg stockings suggested as knee high. No surgery at this point. As needed follow up only.   #Gerd- on omeprazole with Dr. Rush Landmark- meds/follow up can be transitioned to [place] it sounds like.   #from prior kyphoplasty- has some neck tightness more on left- prefers to try home exercises- offered PT referral- she declines  Recommended follow up: Return in about 6 months (around 10/02/2021) for follow up- or  sooner if needed. Future Appointments  Date Time Provider [place]  04/17/2021  8:15 AM [facility] None  04/24/2021 10:15 AM [facility] NURSE [facility] None  07/26/2021  9:20 AM Philemon Kingdom, MD [facility] None  10/23/2021  9:30 AM [facility] HEALTH COACH [facility]   Lab/Order associations: fasting   ICD-10-CM   1. Preventative health care  Z00.00     2. Hyperlipidemia, unspecified hyperlipidemia type  E78.5 CBC with Differential/Platelet    Comprehensive metabolic panel    Lipid panel    3. History of Hodgkin's disease  Z85.71     4. Hypothyroidism, unspecified type  E03.9 TSH    5. Stage 3 chronic kidney disease, unspecified whether stage 3a or 3b CKD (HCC)  N18.30     6. Iron deficiency anemia, unspecified iron deficiency anemia type  D50.9 IBC + Ferritin    7. Symptomatic varicose veins, left  I83.892     8. Age-related osteoporosis without current pathological fracture  M81.0 VITAMIN D 25 Hydroxy (Vit-D Deficiency, Fractures)    9. Need for immunization against influenza  Z23 Flu Vaccine QUAD High Dose(Fluad)      No orders of the defined types were placed in this encounter.  I,Jada Bradford,acting as a scribe for Garret Reddish, MD.,have documented all relevant documentation on the behalf of Garret Reddish, MD,as directed by  Garret Reddish, MD while in the presence of Garret Reddish, MD.  I, Garret Reddish, MD, have reviewed all  documentation for this visit. The documentation on 04/04/21 for the exam, diagnosis, procedures, and orders are all accurate and complete.   Return precautions advised.  Garret Reddish, MD

## 2021-03-29 IMAGING — DX DG HIP (WITH OR WITHOUT PELVIS) 2-3V LEFT
3 series · 3 of 3 positions shown · non-contrast
Comparison: None.

CLINICAL DATA: Left groin pain.

EXAM:
DG HIP (WITH OR WITHOUT PELVIS) 2-3V LEFT

[pelvis ap]
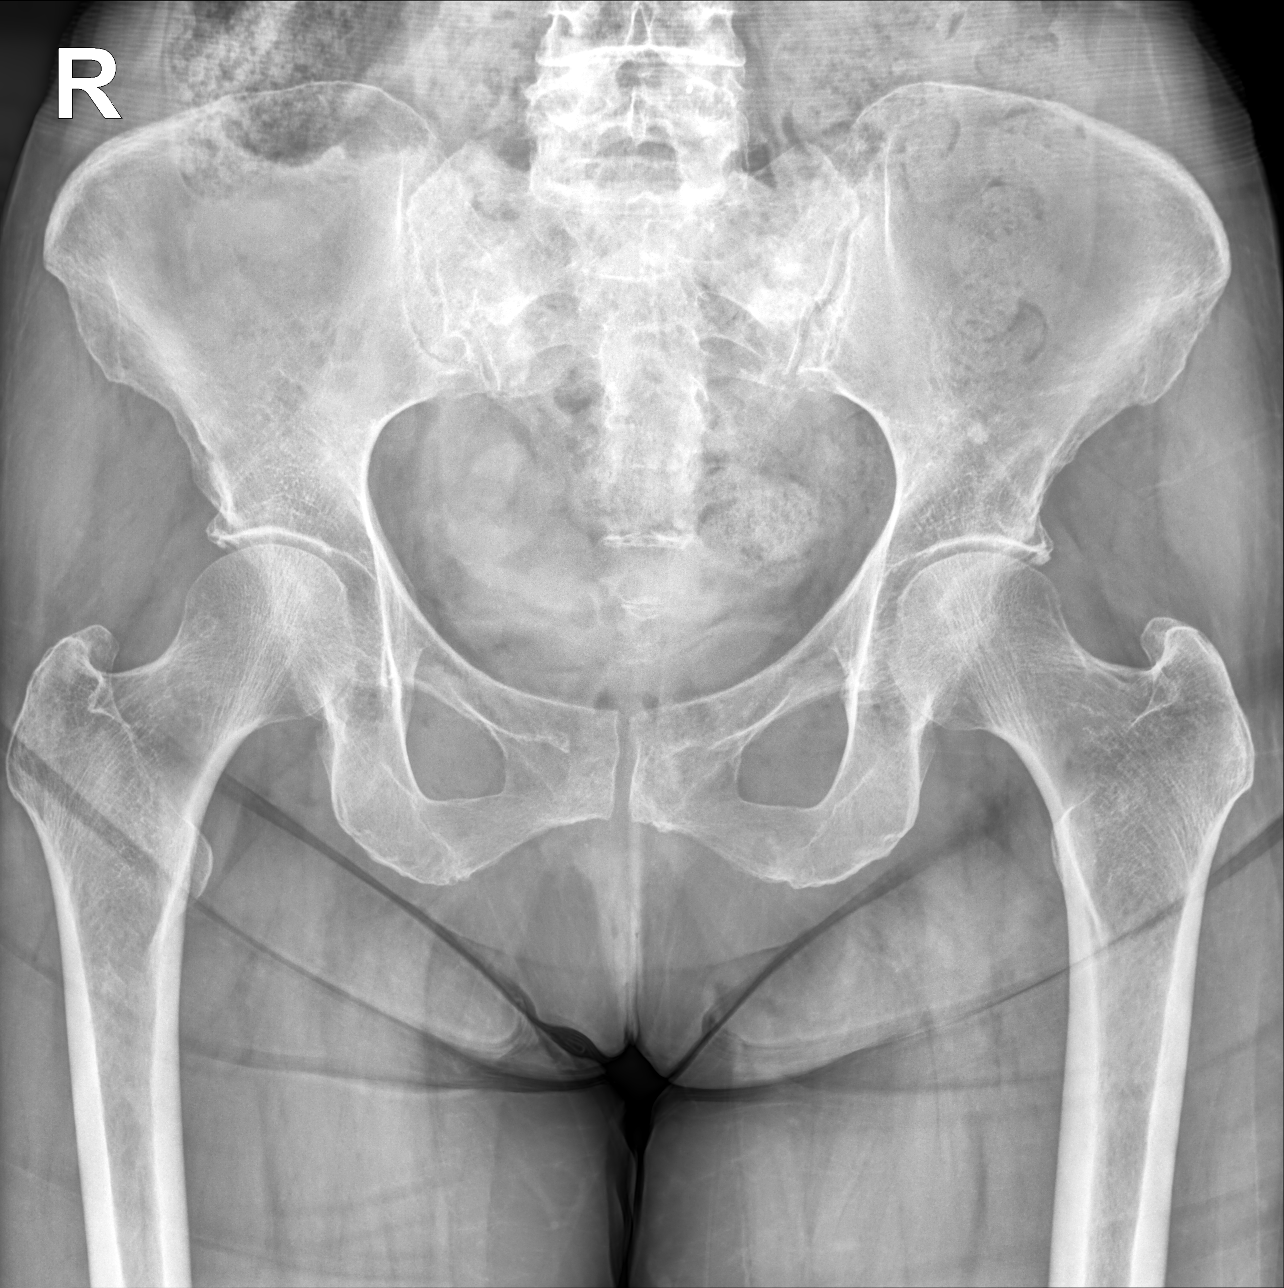

[hip joint ap]
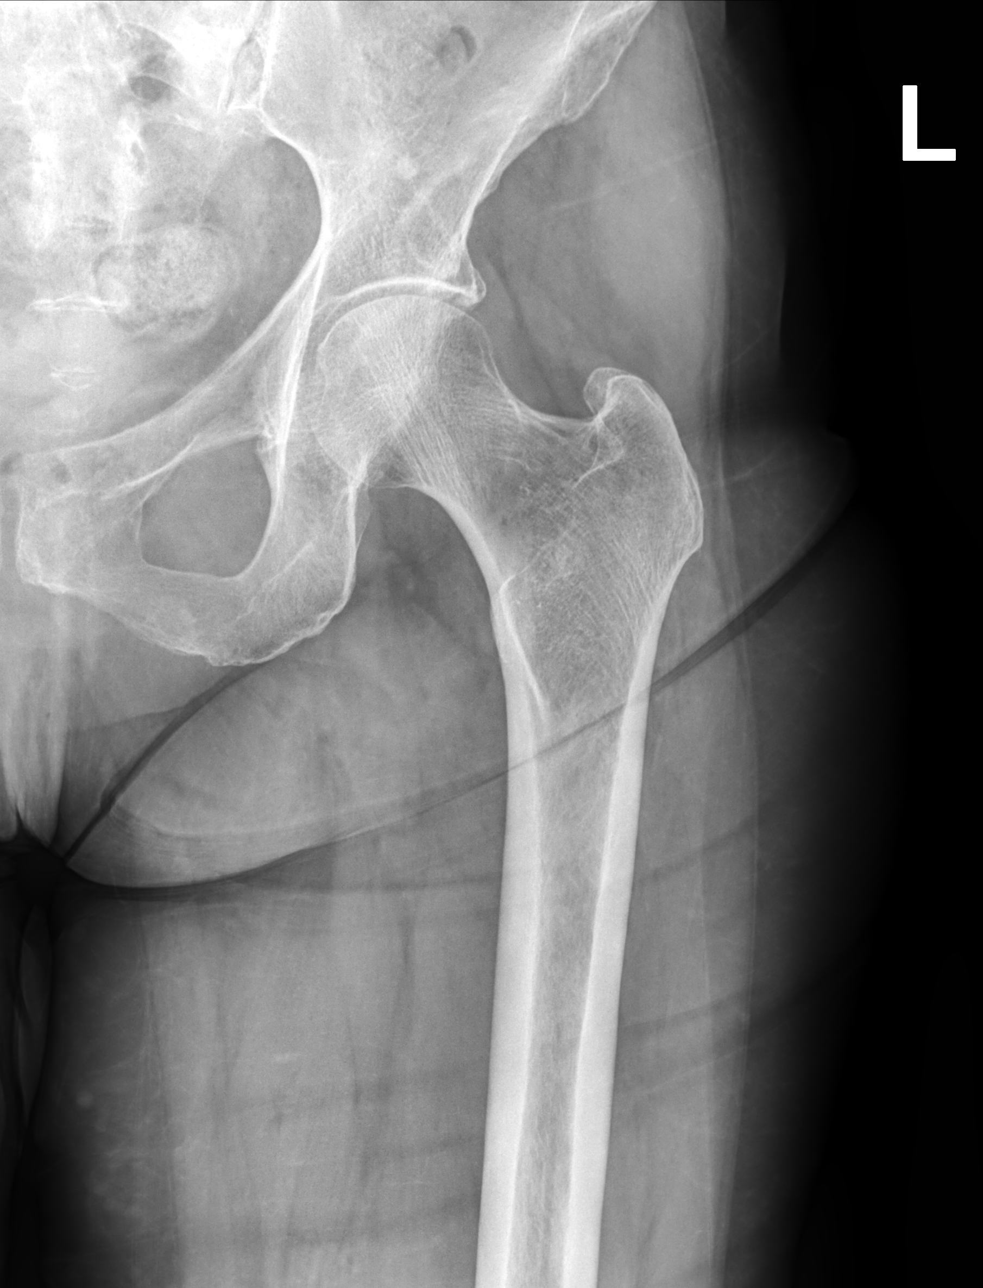

[hip joint (frog view)]
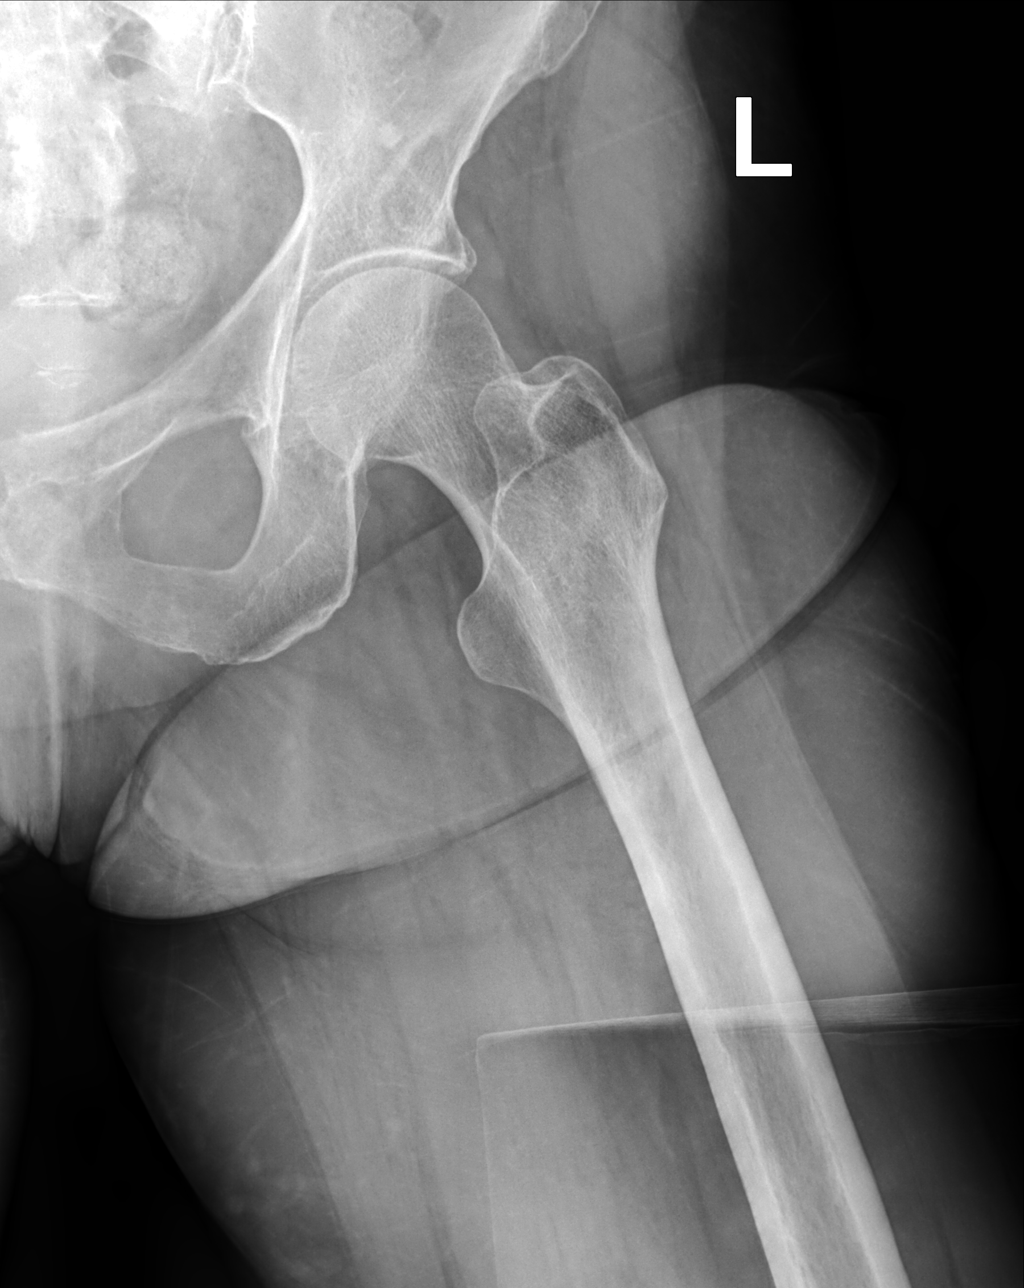

[3 of 3 positions shown; findings below may reference images not displayed]

FINDINGS: There is no evidence of hip fracture or dislocation. There is no
evidence of arthropathy or other significant focal bone abnormality.
Small bone island in the left ilium.
IMPRESSION: No significant abnormality.

## 2021-04-02 DIAGNOSIS — M8589 Other specified disorders of bone density and structure, multiple sites: Secondary | ICD-10-CM | POA: Diagnosis not present

## 2021-04-02 DIAGNOSIS — Z1231 Encounter for screening mammogram for malignant neoplasm of breast: Secondary | ICD-10-CM | POA: Diagnosis not present

## 2021-04-02 LAB — HM DEXA SCAN

## 2021-04-04 ENCOUNTER — Ambulatory Visit (INDEPENDENT_AMBULATORY_CARE_PROVIDER_SITE_OTHER): Payer: Medicare PPO | Admitting: Family Medicine

## 2021-04-04 ENCOUNTER — Encounter: Payer: Self-pay | Admitting: Family Medicine

## 2021-04-04 ENCOUNTER — Other Ambulatory Visit: Payer: Self-pay

## 2021-04-04 VITALS — BP 110/80 | HR 67 | Temp 97.6°F | Ht 63.5 in | Wt 130.6 lb

## 2021-04-04 DIAGNOSIS — N183 Chronic kidney disease, stage 3 unspecified: Secondary | ICD-10-CM | POA: Diagnosis not present

## 2021-04-04 DIAGNOSIS — Z23 Encounter for immunization: Secondary | ICD-10-CM | POA: Diagnosis not present

## 2021-04-04 DIAGNOSIS — I83892 Varicose veins of left lower extremities with other complications: Secondary | ICD-10-CM

## 2021-04-04 DIAGNOSIS — Z0000 Encounter for general adult medical examination without abnormal findings: Secondary | ICD-10-CM

## 2021-04-04 DIAGNOSIS — I5022 Chronic systolic (congestive) heart failure: Secondary | ICD-10-CM

## 2021-04-04 DIAGNOSIS — E785 Hyperlipidemia, unspecified: Secondary | ICD-10-CM | POA: Diagnosis not present

## 2021-04-04 DIAGNOSIS — Z8571 Personal history of Hodgkin lymphoma: Secondary | ICD-10-CM

## 2021-04-04 DIAGNOSIS — D509 Iron deficiency anemia, unspecified: Secondary | ICD-10-CM | POA: Diagnosis not present

## 2021-04-04 DIAGNOSIS — E039 Hypothyroidism, unspecified: Secondary | ICD-10-CM

## 2021-04-04 DIAGNOSIS — M810 Age-related osteoporosis without current pathological fracture: Secondary | ICD-10-CM

## 2021-04-04 DIAGNOSIS — I872 Venous insufficiency (chronic) (peripheral): Secondary | ICD-10-CM

## 2021-04-04 DIAGNOSIS — I483 Typical atrial flutter: Secondary | ICD-10-CM

## 2021-04-04 LAB — IBC + FERRITIN
Ferritin: 56.2 ng/mL (ref 10.0–291.0)
Iron: 47 ug/dL (ref 42–145)
Saturation Ratios: 9.9 % — ABNORMAL LOW (ref 20.0–50.0)
TIBC: 476 ug/dL — ABNORMAL HIGH (ref 250.0–450.0)
Transferrin: 340 mg/dL (ref 212.0–360.0)

## 2021-04-04 LAB — LIPID PANEL
Cholesterol: 99 mg/dL (ref 0–200)
HDL: 50.8 mg/dL (ref >39.00)
LDL Cholesterol: 31 mg/dL (ref 0–99)
NonHDL: 47.83
Total CHOL/HDL Ratio: 2
Triglycerides: 83 mg/dL (ref 0.0–149.0)
VLDL: 16.6 mg/dL (ref 0.0–40.0)

## 2021-04-04 LAB — COMPREHENSIVE METABOLIC PANEL
ALT: 15 U/L (ref 0–35)
AST: 24 U/L (ref 0–37)
Albumin: 4.2 g/dL (ref 3.5–5.2)
Alkaline Phosphatase: 78 U/L (ref 39–117)
BUN: 21 mg/dL (ref 6–23)
CO2: 31 mEq/L (ref 19–32)
Calcium: 10.2 mg/dL (ref 8.4–10.5)
Chloride: 102 mEq/L (ref 96–112)
Creatinine, Ser: 1.28 mg/dL — ABNORMAL HIGH (ref 0.40–1.20)
GFR: 42.38 mL/min — ABNORMAL LOW (ref >60.00)
Glucose, Bld: 88 mg/dL (ref 70–99)
Potassium: 5 mEq/L (ref 3.5–5.1)
Sodium: 141 mEq/L (ref 135–145)
Total Bilirubin: 0.4 mg/dL (ref 0.2–1.2)
Total Protein: 7.3 g/dL (ref 6.0–8.3)

## 2021-04-04 LAB — CBC WITH DIFFERENTIAL/PLATELET
Basophils Absolute: 0.1 10*3/uL (ref 0.0–0.1)
Basophils Relative: 1.2 % (ref 0.0–3.0)
Eosinophils Absolute: 0.5 10*3/uL (ref 0.0–0.7)
Eosinophils Relative: 5.3 % — ABNORMAL HIGH (ref 0.0–5.0)
HCT: 35.7 % — ABNORMAL LOW (ref 36.0–46.0)
Hemoglobin: 11.4 g/dL — ABNORMAL LOW (ref 12.0–15.0)
Lymphocytes Relative: 6.7 % — ABNORMAL LOW (ref 12.0–46.0)
Lymphs Abs: 0.6 10*3/uL — ABNORMAL LOW (ref 0.7–4.0)
MCHC: 31.9 g/dL (ref 30.0–36.0)
MCV: 88.7 fl (ref 78.0–100.0)
Monocytes Absolute: 1.3 10*3/uL — ABNORMAL HIGH (ref 0.1–1.0)
Monocytes Relative: 14.2 % — ABNORMAL HIGH (ref 3.0–12.0)
Neutro Abs: 6.4 10*3/uL (ref 1.4–7.7)
Neutrophils Relative %: 72.6 % (ref 43.0–77.0)
Platelets: 463 10*3/uL — ABNORMAL HIGH (ref 150.0–400.0)
RBC: 4.02 Mil/uL (ref 3.87–5.11)
RDW: 15.5 % (ref 11.5–15.5)
WBC: 8.9 10*3/uL (ref 4.0–10.5)

## 2021-04-04 LAB — TSH: TSH: 0.62 u[IU]/mL (ref 0.35–5.50)

## 2021-04-04 LAB — VITAMIN D 25 HYDROXY (VIT D DEFICIENCY, FRACTURES): VITD: 51.17 ng/mL (ref 30.00–100.00)

## 2021-04-04 NOTE — Patient Instructions (Addendum)
Please stop by lab before you go If you have mychart- we will send your results within 3 business days of [place] receiving them.  If you do not have mychart- we will call you about results within 5 business days of [place] receiving them.  *please also note that you will see labs on mychart as soon as they post. I will later go in and write notes on them- will say "notes from Dr. [name]"  Recommended follow up: Return in about 6 months (around 10/02/2021) for follow up- or sooner if needed.

## 2021-04-06 ENCOUNTER — Encounter: Payer: Self-pay | Admitting: Internal Medicine

## 2021-04-17 ENCOUNTER — Other Ambulatory Visit: Payer: Self-pay

## 2021-04-17 ENCOUNTER — Ambulatory Visit (INDEPENDENT_AMBULATORY_CARE_PROVIDER_SITE_OTHER): Payer: Medicare PPO

## 2021-04-17 DIAGNOSIS — Z7901 Long term (current) use of anticoagulants: Secondary | ICD-10-CM

## 2021-04-17 LAB — POCT INR: INR: 4.1 — AB (ref 2.0–3.0)

## 2021-04-17 NOTE — Patient Instructions (Addendum)
Pre visit review using our clinic review tool, if applicable. No additional management support is needed unless otherwise documented below in the visit note.  Hold and then change weekly dose to take 1 tablet daily except 1/2 tablet on Mondays, Wednesdays and Fridays.  Re-check in 3 weeks.

## 2021-04-17 NOTE — Progress Notes (Signed)
Hold and then change weekly dose to take 1 tablet daily except 1/2 tablet on Mondays, Wednesdays and Fridays.  Re-check in 3 weeks.

## 2021-04-17 NOTE — Progress Notes (Signed)
Patient ID: Grace [name], female   DOB: [birth year], 70 y.o.   MRN: [id number]  Medical screening examination/treatment/procedure(s) were performed by non-physician practitioner and as supervising physician I was immediately available for consultation/collaboration.  I agree with above. Cathlean Cower, MD

## 2021-04-21 ENCOUNTER — Other Ambulatory Visit: Payer: Self-pay | Admitting: Family Medicine

## 2021-04-24 ENCOUNTER — Other Ambulatory Visit: Payer: Self-pay

## 2021-04-24 ENCOUNTER — Ambulatory Visit (INDEPENDENT_AMBULATORY_CARE_PROVIDER_SITE_OTHER): Payer: Medicare PPO

## 2021-04-24 DIAGNOSIS — M810 Age-related osteoporosis without current pathological fracture: Secondary | ICD-10-CM

## 2021-04-24 MED ORDER — DENOSUMAB 60 MG/ML ~~LOC~~ SOSY
60.0000 mg | PREFILLED_SYRINGE | Freq: Once | SUBCUTANEOUS | Status: AC
Start: 2021-04-24 — End: 2021-04-24
  Administered 2021-04-24: 60 mg via SUBCUTANEOUS

## 2021-04-24 NOTE — Progress Notes (Signed)
Prolia injection administered left arm subQ.Patient tolerated well.

## 2021-04-28 NOTE — Telephone Encounter (Signed)
Last Prolia inj 04/24/21 Next Prolia inj due 10/23/21

## 2021-04-30 ENCOUNTER — Other Ambulatory Visit: Payer: Self-pay

## 2021-04-30 ENCOUNTER — Ambulatory Visit (INDEPENDENT_AMBULATORY_CARE_PROVIDER_SITE_OTHER)
Admission: RE | Admit: 2021-04-30 | Discharge: 2021-04-30 | Disposition: A | Discharge status: Home / Self Care | Payer: Medicare PPO | Source: Ambulatory Visit | Attending: Physician Assistant | Admitting: Physician Assistant

## 2021-04-30 ENCOUNTER — Ambulatory Visit: Payer: Medicare PPO | Admitting: Physician Assistant

## 2021-04-30 VITALS — BP 153/89 | HR 78 | Temp 97.9°F | Ht 63.5 in | Wt 132.5 lb

## 2021-04-30 DIAGNOSIS — M546 Pain in thoracic spine: Secondary | ICD-10-CM | POA: Diagnosis not present

## 2021-04-30 DIAGNOSIS — M549 Dorsalgia, unspecified: Secondary | ICD-10-CM

## 2021-04-30 DIAGNOSIS — Z9889 Other specified postprocedural states: Secondary | ICD-10-CM | POA: Diagnosis not present

## 2021-04-30 NOTE — Patient Instructions (Addendum)
Good to meet you. Please go to [employer] [facility] for [place]. I will call / send results through [place]. Ok to continue regular OTC Acetaminophen treatment for pain at this time.

## 2021-04-30 NOTE — Progress Notes (Signed)
Subjective:    Patient ID: Grace [name], female    DOB: 05/14/1951, 70 y.o.   MRN: [id number]  Chief Complaint  Patient presents with   Back Pain    Leftside x 1 week    Back Pain  Patient is in today for left-sided upper back pain x 1.5 weeks. NKI. Hx kyphoplasty T6 and T7 on 12/10/2018 with Dr. Arnoldo Morale. Taking Tylenol and Aleve. Starting to feel a little bit better. Initially had to sleep sitting upright to help reduce the pain. Cold weather is worsening the pain this morning. Hx osteoporosis and taking Prolia, last inj on 04/24/21.    Appt 05/15/2021 with Dr. Adline Mango office.   No CP, SOB, rash, fever, or chills.   Past Medical History:  Diagnosis Date   Anemia    Aortic insufficiency    AORTIC VALVE REPLACEMENT, HX OF 04/27/2007   Qualifier: Diagnosis of  By: Leanne Chang MD, Bruce     Cancer [facility])    left breast- surgery and chemo   CHF (congestive heart failure) ([place])    Chronic kidney disease    Stage 3   Congestive heart failure ([place]) 08/03/2009   [facility] Dr. Paul Half. Last echo 2010 with EF 30%. Restrictive due to radiation. AVR MVR. 02/2014 visit planned.     Coronary artery disease    11/06/06 [facility]): 40% oLM, 60% dLAD, 30% oRCA   Dyspnea    with exertion   Dysrhythmia    paroxsymal a-flutter 2018; intermittent left BBB (2016)   Edema of both feet    GI bleed    Heart murmur    mitral and aortic valve replacement   Hodgkin's disease    ~1974, s/p Mantle field radiation and chemo   Hyperlipidemia    Hypertension    Hypothyroidism    Migraine    none since 2008   MITRAL VALVE REPLACEMENT, HX OF 04/27/2007   Qualifier: Diagnosis of  By: Leanne Chang MD, Bruce     Pericarditis    s/p pericardidal stripping through lateral thoracotomy 01/2007 [facility])   Stroke ([place]) 2008   No residual effects   TIA (transient ischemic attack)    Tricuspid regurgitation    Varicose veins of both lower extremities    left worse than right- have gotten larger in April 2020   WPW  (Wolff-Parkinson-White syndrome)     Past Surgical History:  Procedure Laterality Date   AORTIC VALVE SURGERY     19 mm mechanical Regent AVR 05/2006 ([facility], Dr. Lajuana Matte)   bilateral breast implants     BIOPSY  02/22/2019   Procedure: BIOPSY;  Surgeon: Irving Copas., MD;  Location: [facility] ENDOSCOPY;  Service: Gastroenterology;;   COLONOSCOPY WITH PROPOFOL N/A 02/22/2019   Procedure: COLONOSCOPY WITH PROPOFOL;  Surgeon: Irving Copas., MD;  Location: [place];  Service: Gastroenterology;  Laterality: N/A;   ENDOMETRIAL ABLATION     ENDOSCOPIC MUCOSAL RESECTION  01/10/2020   Procedure: ENDOSCOPIC MUCOSAL RESECTION;  Surgeon: Rush Landmark Telford Nab., MD;  Location: [place];  Service: Gastroenterology;;   ENTEROSCOPY N/A 02/22/2019   Procedure: ENTEROSCOPY;  Surgeon: Rush Landmark Telford Nab., MD;  Location: [place];  Service: Gastroenterology;  Laterality: N/A;   ESOPHAGOGASTRODUODENOSCOPY (EGD) WITH PROPOFOL N/A 01/10/2020   Procedure: ESOPHAGOGASTRODUODENOSCOPY (EGD) WITH PROPOFOL;  Surgeon: Rush Landmark Telford Nab., MD;  Location: [place];  Service: Gastroenterology;  Laterality: N/A;   EXPLORATORY LAPAROTOMY     HEMOSTASIS CLIP PLACEMENT  02/22/2019   Procedure: HEMOSTASIS CLIP PLACEMENT;  Surgeon: Justice Britain  Brooke Bonito., MD;  Location: [place];  Service: Gastroenterology;;   HEMOSTASIS CLIP PLACEMENT  01/10/2020   Procedure: HEMOSTASIS CLIP PLACEMENT;  Surgeon: Irving Copas., MD;  Location: [place];  Service: Gastroenterology;;   KYPHOPLASTY N/A 12/10/2018   Procedure: KYPHOPLASTY THORACIC SIX- THORACIC SEVEN;  Surgeon: Newman Pies, MD;  Location: [place];  Service: Neurosurgery;  Laterality: N/A;  KYPHOPLASTY THORACIC SIX- THORACIC SEVEN   MASTECTOMY     MITRAL VALVE REPLACEMENT     25 mm St. Jude MVR, 05/2006, Dr. Lajuana Matte, [place]   pericardial stripping  9/08   POLYPECTOMY  02/22/2019   Procedure: POLYPECTOMY;  Surgeon:  Mansouraty, Telford Nab., MD;  Location: [facility] ENDOSCOPY;  Service: Gastroenterology;;   SPLENECTOMY     ~ [place] INJECTION  01/10/2020   Procedure: SUBMUCOSAL LIFTING INJECTION;  Surgeon: Irving Copas., MD;  Location: [place];  Service: Gastroenterology;;   SUBMUCOSAL TATTOO INJECTION  02/22/2019   Procedure: SUBMUCOSAL TATTOO INJECTION;  Surgeon: Irving Copas., MD;  Location: [facility] ENDOSCOPY;  Service: Gastroenterology;;   THORACOTOMY     for pericardial stripping 01/2007 (Dr. Elenor Quinones, [facility])   TONSILLECTOMY     valvulopathy  06/13/06    Family History  Problem Relation Age of Onset   Heart disease Mother    Cancer Mother        breast   Other Father        hunting gun accident   Heart disease Maternal Aunt    Cancer Maternal Aunt        uterine   Heart disease Maternal Grandmother    Diabetes Maternal Grandmother    Hypercalcemia Neg Hx    Colon cancer Neg Hx    Esophageal cancer Neg Hx    Liver disease Neg Hx    Pancreatic cancer Neg Hx    Rectal cancer Neg Hx    Stomach cancer Neg Hx     Social History   Tobacco Use   Smoking status: Never   Smokeless tobacco: Never  Vaping Use   Vaping Use: Never used  Substance Use Topics   Alcohol use: No   Drug use: No     Allergies  Allergen Reactions   Digoxin Other (See Comments)    bradycardia   Statins     myalgia    Review of Systems  Musculoskeletal:  Positive for back pain.  NEGATIVE UNLESS OTHERWISE INDICATED IN HPI      Objective:     BP (!) 153/89   Pulse 78   Temp 97.9 F (36.6 C)   Ht 5' 3.5" (1.613 m)   Wt 132 lb 8 oz (60.1 kg)   SpO2 98%   BMI 23.10 kg/m   Wt Readings from Last 3 Encounters:  04/30/21 132 lb 8 oz (60.1 kg)  04/04/21 130 lb 9.6 oz (59.2 kg)  02/21/21 130 lb 4.8 oz (59.1 kg)    BP Readings from Last 3 Encounters:  04/30/21 (!) 153/89  04/04/21 110/80  02/21/21 (!) 150/87     Physical Exam Vitals and  nursing note reviewed.  Constitutional:      General: She is not in acute distress. Cardiovascular:     Rate and Rhythm: Normal rate and regular rhythm.     Heart sounds: No murmur heard. Pulmonary:     Effort: Pulmonary effort is normal.     Breath sounds: Normal breath sounds.  Musculoskeletal:     Thoracic  back: Bony tenderness present. Decreased range of motion. Scoliosis present.  Skin:    General: Skin is warm.     Comments: No rash   Neurological:     General: No focal deficit present.     Mental Status: She is alert.       Assessment & Plan:   Problem List Items Addressed This Visit   None Visit Diagnoses     Acute upper back pain    -  Primary   Relevant Orders   DG Thoracic Spine 2 View   History of kyphoplasty           1. Acute upper back pain 2. History of kyphoplasty -Plan for XR thoracic spine at [facility] today, will call with results -OTC Tylenol prn pain, rest, NO heavy lifting -F/up with Dr. Arnoldo Morale as scheduled.   Sasha Rogel M Albion Weatherholtz, PA-C

## 2021-05-01 ENCOUNTER — Encounter: Payer: Self-pay | Admitting: Family Medicine

## 2021-05-01 ENCOUNTER — Other Ambulatory Visit: Payer: Self-pay

## 2021-05-01 DIAGNOSIS — S22000A Wedge compression fracture of unspecified thoracic vertebra, initial encounter for closed fracture: Secondary | ICD-10-CM

## 2021-05-01 NOTE — Progress Notes (Signed)
Noted, it looks like Dione Housekeeper is handling this, thanks!

## 2021-05-03 IMAGING — DX THORACIC SPINE 2 VIEWS
4 series · 4 of 4 positions shown · non-contrast
Comparison: 06/23/2018

CLINICAL DATA: Back pain following heavy lifting, initial encounter

EXAM:
THORACIC SPINE 2 VIEWS

[thoracic spine ap (1 of 3)]
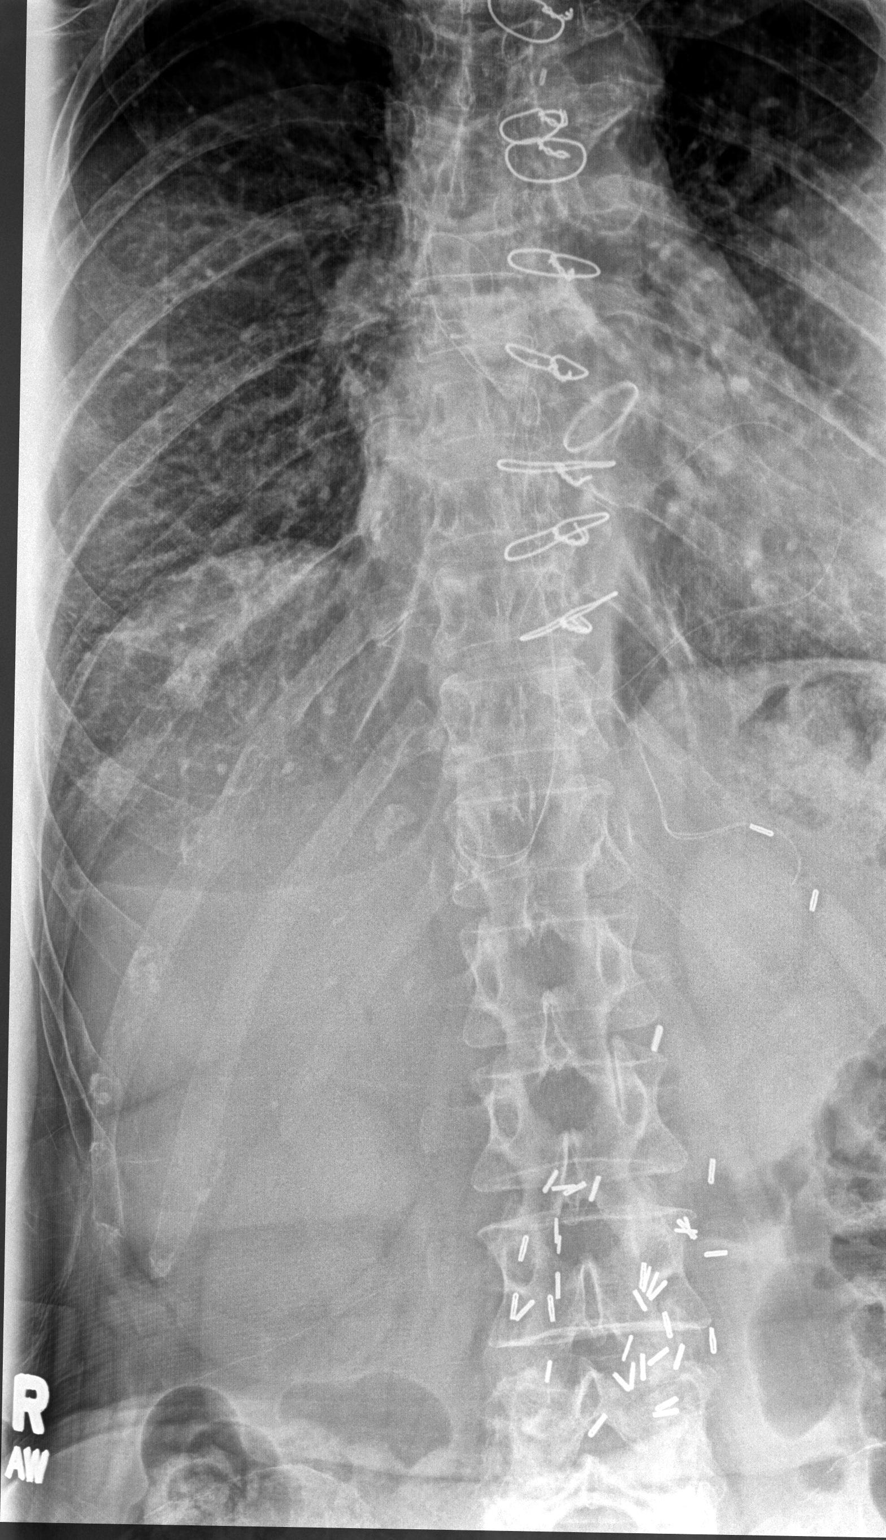

[thoracic spine ap (2 of 3)]
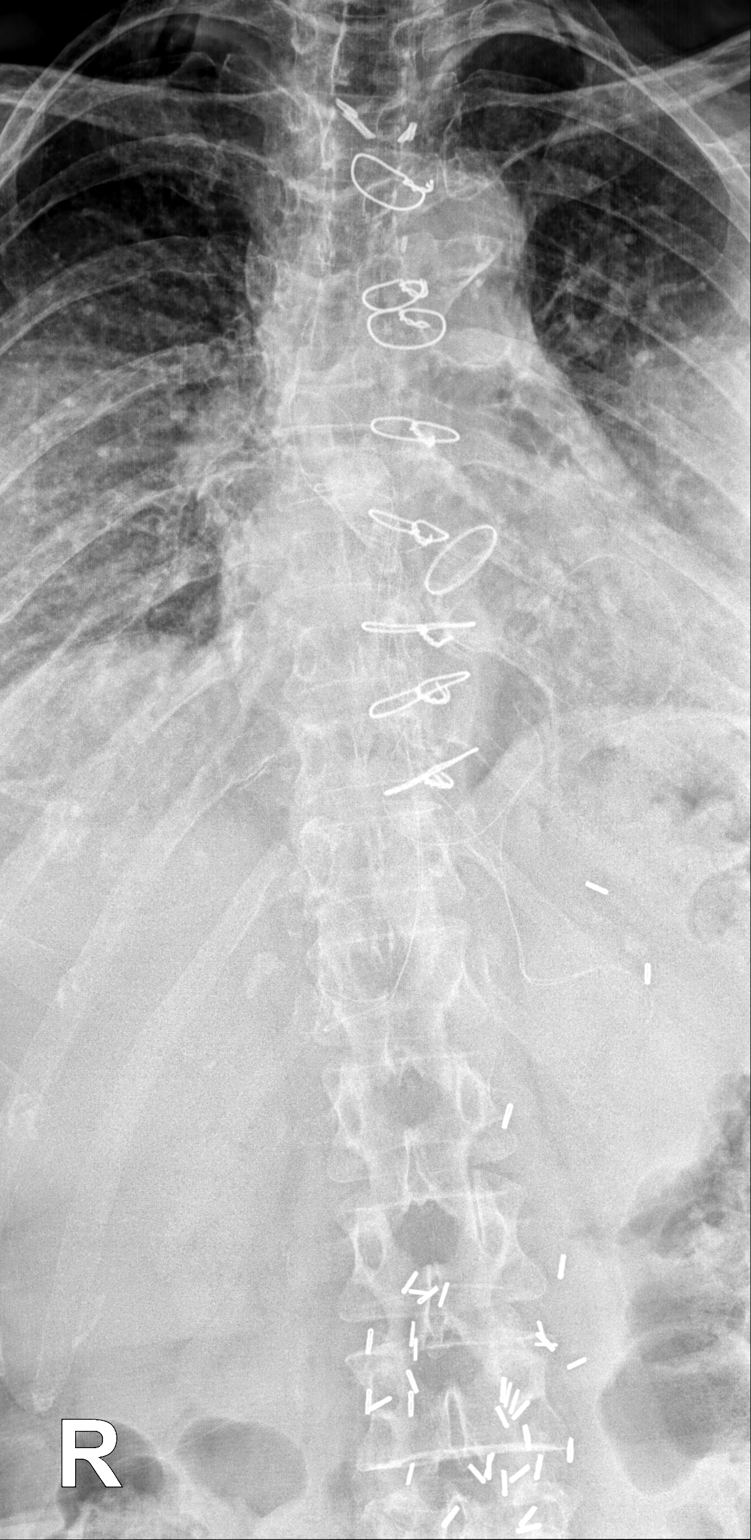

[thoracic spine ap (3 of 3)]
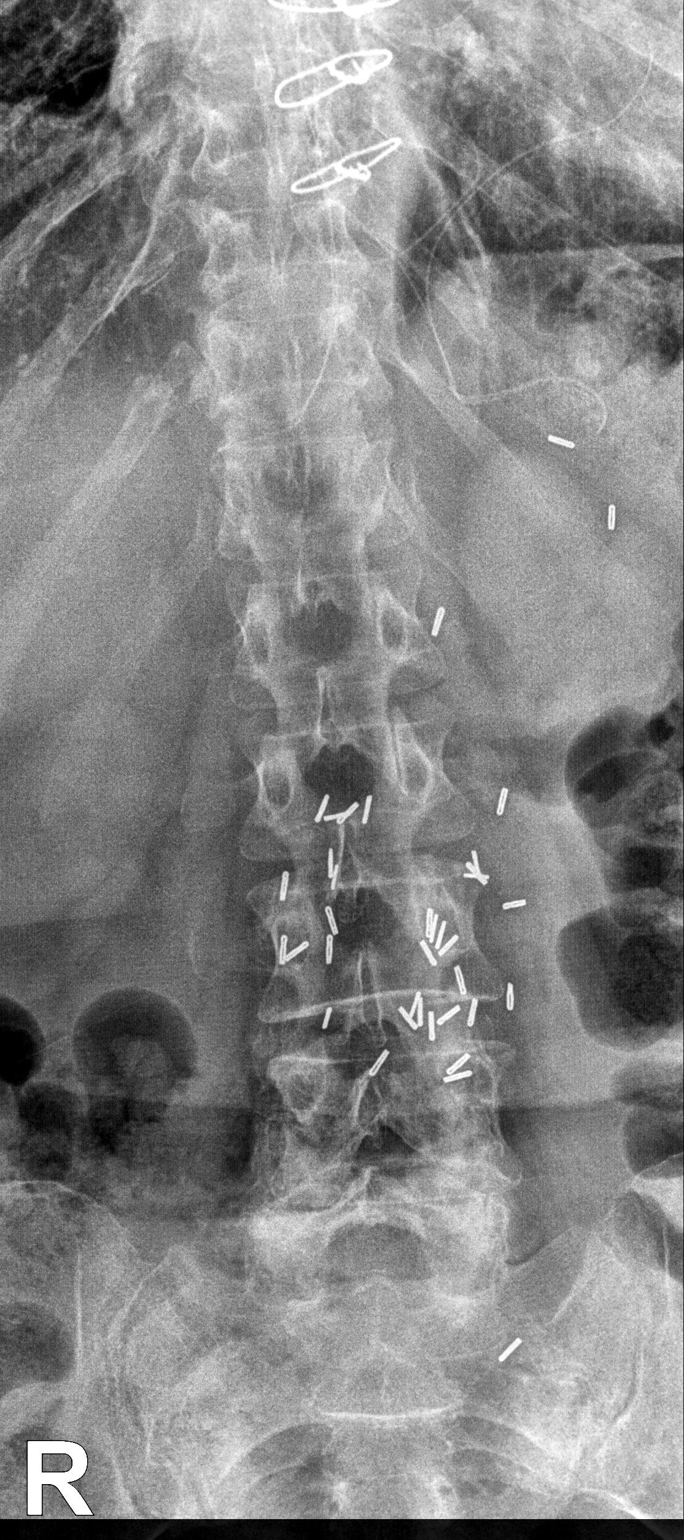

[thoracic spine lat]
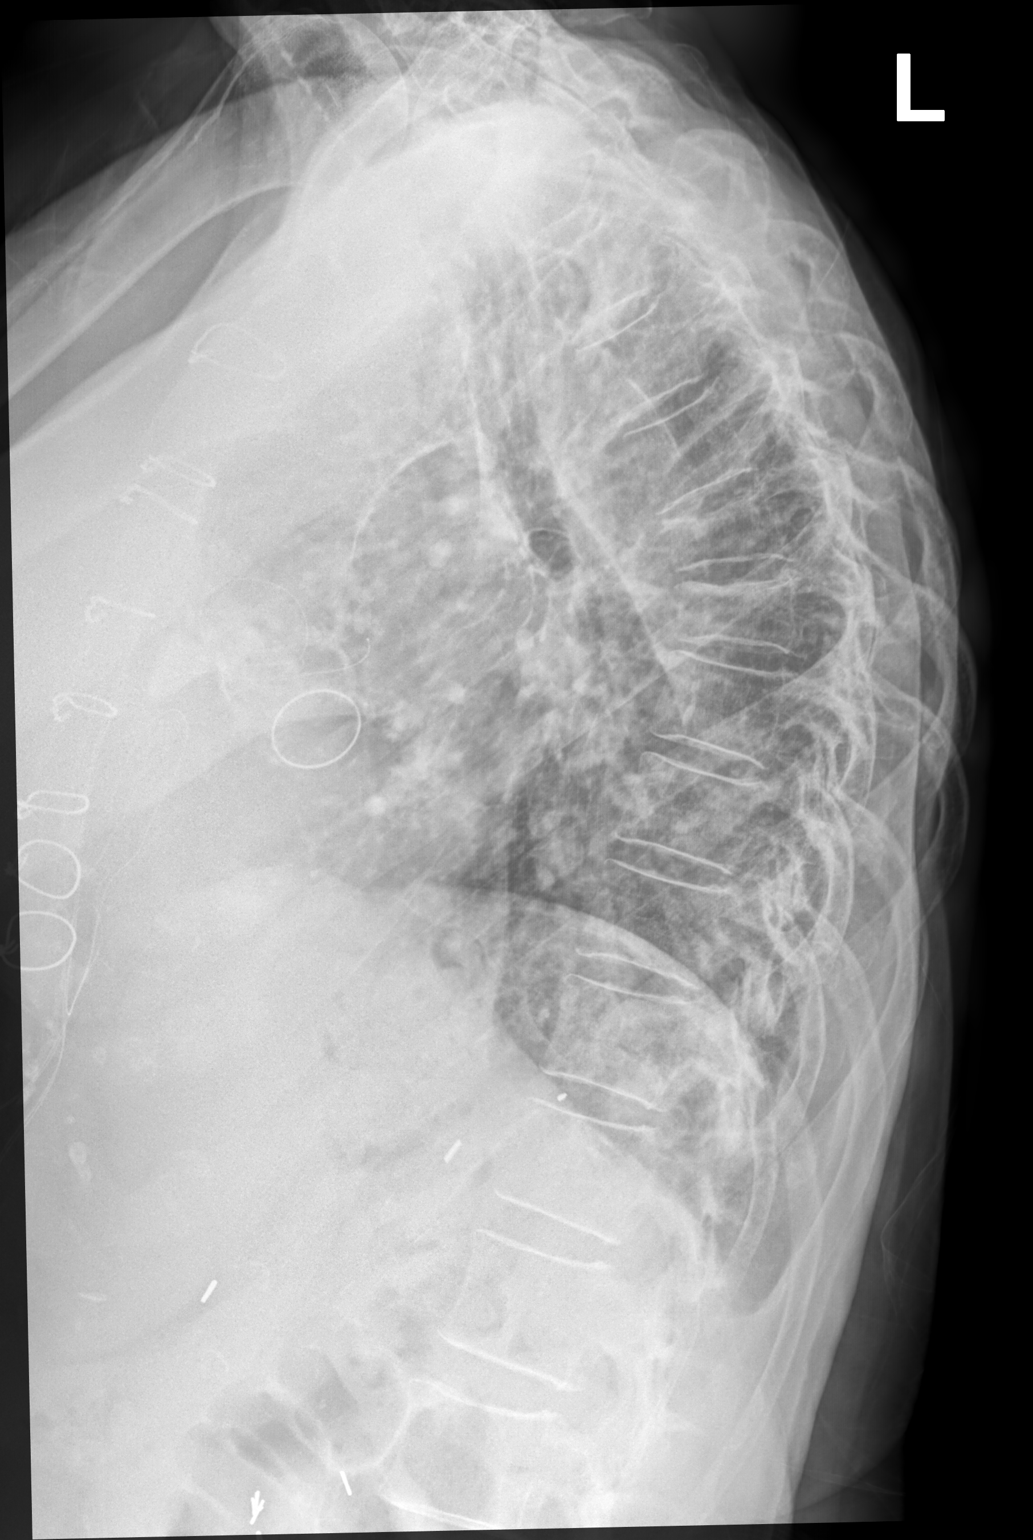

[4 of 4 positions shown; findings below may reference images not displayed]

FINDINGS: Compression deformities are noted at T6 and T7 with approximately
20% vertebral body height loss. These are new from the prior exam
but of uncertain chronicity. No other compression deformity is seen.
No paraspinal mass is noted. No rib abnormality is seen.
IMPRESSION: T6 and T7 compression deformities with mild height loss new from the
prior exam of 06/23/2018. MRI may be helpful for further evaluation.

## 2021-05-08 ENCOUNTER — Ambulatory Visit (INDEPENDENT_AMBULATORY_CARE_PROVIDER_SITE_OTHER): Payer: Medicare PPO

## 2021-05-08 ENCOUNTER — Other Ambulatory Visit: Payer: Self-pay

## 2021-05-08 DIAGNOSIS — Z7901 Long term (current) use of anticoagulants: Secondary | ICD-10-CM

## 2021-05-08 LAB — POCT INR: INR: 3 (ref 2.0–3.0)

## 2021-05-08 NOTE — Progress Notes (Signed)
Patient ID: Grace [name], female   DOB: 1951/05/16, 70 y.o.   MRN: [id number]  Medical screening examination/treatment/procedure(s) were performed by non-physician practitioner and as supervising physician I was immediately available for consultation/collaboration.  I agree with above. Cathlean Cower, MD

## 2021-05-08 NOTE — Progress Notes (Signed)
Continue to take 1 tablet daily except 1/2 tablet on Mondays, Wednesdays and Fridays.  Re-check in 4 weeks.

## 2021-05-08 NOTE — Patient Instructions (Addendum)
Pre visit review using our clinic review tool, if applicable. No additional management support is needed unless otherwise documented below in the visit note.  Continue to take 1 tablet daily except 1/2 tablet on Mondays, Wednesdays and Fridays.  Re-check in 4 weeks.

## 2021-05-11 ENCOUNTER — Other Ambulatory Visit: Payer: Self-pay | Admitting: Family Medicine

## 2021-05-11 ENCOUNTER — Other Ambulatory Visit: Payer: Self-pay | Admitting: Gastroenterology

## 2021-05-14 ENCOUNTER — Other Ambulatory Visit: Payer: Self-pay

## 2021-05-14 MED ORDER — OMEPRAZOLE 20 MG PO CPDR: DELAYED_RELEASE_CAPSULE | ORAL | 3 refills | Status: AC

## 2021-05-15 DIAGNOSIS — R030 Elevated blood-pressure reading, without diagnosis of hypertension: Secondary | ICD-10-CM | POA: Diagnosis not present

## 2021-05-15 DIAGNOSIS — S22060A Wedge compression fracture of T7-T8 vertebra, initial encounter for closed fracture: Secondary | ICD-10-CM | POA: Diagnosis not present

## 2021-05-19 ENCOUNTER — Other Ambulatory Visit: Payer: Self-pay

## 2021-05-19 ENCOUNTER — Emergency Department (HOSPITAL_COMMUNITY): Payer: Medicare PPO

## 2021-05-19 ENCOUNTER — Inpatient Hospital Stay (HOSPITAL_COMMUNITY)
Admission: EM | Admit: 2021-05-19 | Discharge: 2021-06-27 | DRG: 956 | Disposition: E | Payer: Medicare PPO | Attending: Internal Medicine | Admitting: Internal Medicine

## 2021-05-19 DIAGNOSIS — N183 Chronic kidney disease, stage 3 unspecified: Secondary | ICD-10-CM | POA: Diagnosis not present

## 2021-05-19 DIAGNOSIS — S72011A Unspecified intracapsular fracture of right femur, initial encounter for closed fracture: Secondary | ICD-10-CM | POA: Diagnosis not present

## 2021-05-19 DIAGNOSIS — R102 Pelvic and perineal pain: Secondary | ICD-10-CM | POA: Diagnosis not present

## 2021-05-19 DIAGNOSIS — S32110A Nondisplaced Zone I fracture of sacrum, initial encounter for closed fracture: Secondary | ICD-10-CM | POA: Diagnosis not present

## 2021-05-19 DIAGNOSIS — Z8049 Family history of malignant neoplasm of other genital organs: Secondary | ICD-10-CM

## 2021-05-19 DIAGNOSIS — Z9229 Personal history of other drug therapy: Secondary | ICD-10-CM

## 2021-05-19 DIAGNOSIS — Z7901 Long term (current) use of anticoagulants: Secondary | ICD-10-CM

## 2021-05-19 DIAGNOSIS — Z952 Presence of prosthetic heart valve: Secondary | ICD-10-CM

## 2021-05-19 DIAGNOSIS — Z9221 Personal history of antineoplastic chemotherapy: Secondary | ICD-10-CM

## 2021-05-19 DIAGNOSIS — I2510 Atherosclerotic heart disease of native coronary artery without angina pectoris: Secondary | ICD-10-CM | POA: Diagnosis present

## 2021-05-19 DIAGNOSIS — I480 Paroxysmal atrial fibrillation: Secondary | ICD-10-CM | POA: Diagnosis present

## 2021-05-19 DIAGNOSIS — Z01810 Encounter for preprocedural cardiovascular examination: Secondary | ICD-10-CM | POA: Diagnosis not present

## 2021-05-19 DIAGNOSIS — M810 Age-related osteoporosis without current pathological fracture: Secondary | ICD-10-CM | POA: Diagnosis present

## 2021-05-19 DIAGNOSIS — W010XXA Fall on same level from slipping, tripping and stumbling without subsequent striking against object, initial encounter: Secondary | ICD-10-CM | POA: Diagnosis present

## 2021-05-19 DIAGNOSIS — K3189 Other diseases of stomach and duodenum: Secondary | ICD-10-CM | POA: Diagnosis not present

## 2021-05-19 DIAGNOSIS — N182 Chronic kidney disease, stage 2 (mild): Secondary | ICD-10-CM | POA: Diagnosis present

## 2021-05-19 DIAGNOSIS — S7291XA Unspecified fracture of right femur, initial encounter for closed fracture: Secondary | ICD-10-CM | POA: Diagnosis present

## 2021-05-19 DIAGNOSIS — E876 Hypokalemia: Secondary | ICD-10-CM | POA: Diagnosis not present

## 2021-05-19 DIAGNOSIS — W19XXXA Unspecified fall, initial encounter: Secondary | ICD-10-CM | POA: Diagnosis not present

## 2021-05-19 DIAGNOSIS — E039 Hypothyroidism, unspecified: Secondary | ICD-10-CM | POA: Diagnosis present

## 2021-05-19 DIAGNOSIS — Z471 Aftercare following joint replacement surgery: Secondary | ICD-10-CM | POA: Diagnosis not present

## 2021-05-19 DIAGNOSIS — Z9882 Breast implant status: Secondary | ICD-10-CM

## 2021-05-19 DIAGNOSIS — I483 Typical atrial flutter: Secondary | ICD-10-CM | POA: Diagnosis not present

## 2021-05-19 DIAGNOSIS — Z8249 Family history of ischemic heart disease and other diseases of the circulatory system: Secondary | ICD-10-CM

## 2021-05-19 DIAGNOSIS — R112 Nausea with vomiting, unspecified: Secondary | ICD-10-CM | POA: Diagnosis not present

## 2021-05-19 DIAGNOSIS — S72041A Displaced fracture of base of neck of right femur, initial encounter for closed fracture: Secondary | ICD-10-CM | POA: Diagnosis not present

## 2021-05-19 DIAGNOSIS — R531 Weakness: Secondary | ICD-10-CM | POA: Diagnosis not present

## 2021-05-19 DIAGNOSIS — Z9081 Acquired absence of spleen: Secondary | ICD-10-CM | POA: Diagnosis not present

## 2021-05-19 DIAGNOSIS — Z96641 Presence of right artificial hip joint: Secondary | ICD-10-CM | POA: Diagnosis not present

## 2021-05-19 DIAGNOSIS — E785 Hyperlipidemia, unspecified: Secondary | ICD-10-CM | POA: Diagnosis present

## 2021-05-19 DIAGNOSIS — Z8679 Personal history of other diseases of the circulatory system: Secondary | ICD-10-CM | POA: Diagnosis not present

## 2021-05-19 DIAGNOSIS — I872 Venous insufficiency (chronic) (peripheral): Secondary | ICD-10-CM | POA: Diagnosis present

## 2021-05-19 DIAGNOSIS — I509 Heart failure, unspecified: Secondary | ICD-10-CM

## 2021-05-19 DIAGNOSIS — I469 Cardiac arrest, cause unspecified: Secondary | ICD-10-CM | POA: Diagnosis not present

## 2021-05-19 DIAGNOSIS — Z8673 Personal history of transient ischemic attack (TIA), and cerebral infarction without residual deficits: Secondary | ICD-10-CM

## 2021-05-19 DIAGNOSIS — I420 Dilated cardiomyopathy: Secondary | ICD-10-CM | POA: Diagnosis not present

## 2021-05-19 DIAGNOSIS — S72001A Fracture of unspecified part of neck of right femur, initial encounter for closed fracture: Principal | ICD-10-CM

## 2021-05-19 DIAGNOSIS — I130 Hypertensive heart and chronic kidney disease with heart failure and stage 1 through stage 4 chronic kidney disease, or unspecified chronic kidney disease: Secondary | ICD-10-CM | POA: Diagnosis present

## 2021-05-19 DIAGNOSIS — Z7982 Long term (current) use of aspirin: Secondary | ICD-10-CM

## 2021-05-19 DIAGNOSIS — Z9013 Acquired absence of bilateral breasts and nipples: Secondary | ICD-10-CM | POA: Diagnosis not present

## 2021-05-19 DIAGNOSIS — Y92009 Unspecified place in unspecified non-institutional (private) residence as the place of occurrence of the external cause: Secondary | ICD-10-CM | POA: Diagnosis not present

## 2021-05-19 DIAGNOSIS — I071 Rheumatic tricuspid insufficiency: Secondary | ICD-10-CM | POA: Diagnosis present

## 2021-05-19 DIAGNOSIS — Z803 Family history of malignant neoplasm of breast: Secondary | ICD-10-CM

## 2021-05-19 DIAGNOSIS — Z853 Personal history of malignant neoplasm of breast: Secondary | ICD-10-CM

## 2021-05-19 DIAGNOSIS — Z888 Allergy status to other drugs, medicaments and biological substances status: Secondary | ICD-10-CM

## 2021-05-19 DIAGNOSIS — D509 Iron deficiency anemia, unspecified: Secondary | ICD-10-CM | POA: Diagnosis present

## 2021-05-19 DIAGNOSIS — I4892 Unspecified atrial flutter: Secondary | ICD-10-CM | POA: Diagnosis present

## 2021-05-19 DIAGNOSIS — M4316 Spondylolisthesis, lumbar region: Secondary | ICD-10-CM | POA: Diagnosis not present

## 2021-05-19 DIAGNOSIS — Z20822 Contact with and (suspected) exposure to covid-19: Secondary | ICD-10-CM | POA: Diagnosis present

## 2021-05-19 DIAGNOSIS — R079 Chest pain, unspecified: Secondary | ICD-10-CM | POA: Diagnosis not present

## 2021-05-19 DIAGNOSIS — Z419 Encounter for procedure for purposes other than remedying health state, unspecified: Secondary | ICD-10-CM

## 2021-05-19 DIAGNOSIS — I5022 Chronic systolic (congestive) heart failure: Secondary | ICD-10-CM | POA: Diagnosis not present

## 2021-05-19 DIAGNOSIS — S3210XA Unspecified fracture of sacrum, initial encounter for closed fracture: Secondary | ICD-10-CM | POA: Diagnosis not present

## 2021-05-19 DIAGNOSIS — D631 Anemia in chronic kidney disease: Secondary | ICD-10-CM | POA: Diagnosis not present

## 2021-05-19 DIAGNOSIS — Z923 Personal history of irradiation: Secondary | ICD-10-CM

## 2021-05-19 DIAGNOSIS — Z79890 Hormone replacement therapy (postmenopausal): Secondary | ICD-10-CM

## 2021-05-19 DIAGNOSIS — I5032 Chronic diastolic (congestive) heart failure: Secondary | ICD-10-CM | POA: Diagnosis present

## 2021-05-19 DIAGNOSIS — B370 Candidal stomatitis: Secondary | ICD-10-CM | POA: Diagnosis present

## 2021-05-19 DIAGNOSIS — Z8571 Personal history of Hodgkin lymphoma: Secondary | ICD-10-CM

## 2021-05-19 DIAGNOSIS — E871 Hypo-osmolality and hyponatremia: Secondary | ICD-10-CM | POA: Diagnosis not present

## 2021-05-19 DIAGNOSIS — M79604 Pain in right leg: Secondary | ICD-10-CM | POA: Diagnosis not present

## 2021-05-19 DIAGNOSIS — Z833 Family history of diabetes mellitus: Secondary | ICD-10-CM

## 2021-05-19 DIAGNOSIS — I428 Other cardiomyopathies: Secondary | ICD-10-CM | POA: Diagnosis not present

## 2021-05-19 DIAGNOSIS — K6389 Other specified diseases of intestine: Secondary | ICD-10-CM | POA: Diagnosis not present

## 2021-05-19 LAB — COMPREHENSIVE METABOLIC PANEL
ALT: 21 U/L (ref 0–44)
AST: 31 U/L (ref 15–41)
Albumin: 3.7 g/dL (ref 3.5–5.0)
Alkaline Phosphatase: 72 U/L (ref 38–126)
Anion gap: 7 (ref 5–15)
BUN: 22 mg/dL (ref 8–23)
CO2: 27 mmol/L (ref 22–32)
Calcium: 8.3 mg/dL — ABNORMAL LOW (ref 8.9–10.3)
Chloride: 102 mmol/L (ref 98–111)
Creatinine, Ser: 1.04 mg/dL — ABNORMAL HIGH (ref 0.44–1.00)
GFR, Estimated: 58 mL/min — ABNORMAL LOW (ref >60)
Glucose, Bld: 95 mg/dL (ref 70–99)
Potassium: 4 mmol/L (ref 3.5–5.1)
Sodium: 136 mmol/L (ref 135–145)
Total Bilirubin: 0.5 mg/dL (ref 0.3–1.2)
Total Protein: 7 g/dL (ref 6.5–8.1)

## 2021-05-19 LAB — CBC WITH DIFFERENTIAL/PLATELET
Abs Immature Granulocytes: 0.21 10*3/uL — ABNORMAL HIGH (ref 0.00–0.07)
Basophils Absolute: 0.1 10*3/uL (ref 0.0–0.1)
Basophils Relative: 1 %
Eosinophils Absolute: 0.3 10*3/uL (ref 0.0–0.5)
Eosinophils Relative: 2 %
HCT: 38.2 % (ref 36.0–46.0)
Hemoglobin: 11.7 g/dL — ABNORMAL LOW (ref 12.0–15.0)
Immature Granulocytes: 2 %
Lymphocytes Relative: 7 %
Lymphs Abs: 0.9 10*3/uL (ref 0.7–4.0)
MCH: 28.2 pg (ref 26.0–34.0)
MCHC: 30.6 g/dL (ref 30.0–36.0)
MCV: 92 fL (ref 80.0–100.0)
Monocytes Absolute: 1.4 10*3/uL — ABNORMAL HIGH (ref 0.1–1.0)
Monocytes Relative: 10 %
Neutro Abs: 10.9 10*3/uL — ABNORMAL HIGH (ref 1.7–7.7)
Neutrophils Relative %: 78 %
Platelets: 501 10*3/uL — ABNORMAL HIGH (ref 150–400)
RBC: 4.15 MIL/uL (ref 3.87–5.11)
RDW: 15.9 % — ABNORMAL HIGH (ref 11.5–15.5)
WBC: 13.8 10*3/uL — ABNORMAL HIGH (ref 4.0–10.5)
nRBC: 0 % (ref 0.0–0.2)

## 2021-05-19 LAB — PROTIME-INR
INR: 2.9 — ABNORMAL HIGH (ref 0.8–1.2)
Prothrombin Time: 29.9 seconds — ABNORMAL HIGH (ref 11.4–15.2)

## 2021-05-19 MED ORDER — HYDROMORPHONE HCL 1 MG/ML IJ SOLN
1.0000 mg | Freq: Once | INTRAMUSCULAR | Status: AC
  Administered 2021-05-19: 1 mg via INTRAVENOUS
  Filled 2021-05-19: qty 1

## 2021-05-19 MED ORDER — TRAMADOL HCL 50 MG PO TABS
50.0000 mg | ORAL_TABLET | Freq: Once | ORAL | Status: AC
  Administered 2021-05-19: 22:00:00 50 mg via ORAL
  Filled 2021-05-19: qty 1

## 2021-05-19 NOTE — Progress Notes (Signed)
Spoke to ED PA about patient.  Patient has displaced right femoral neck fracture, and will likely require THA vs hemiarthroplasty.  Currently with INR 2.9 on warfarin.  Will switch to lovenox in anticipation of OR.  Admit to medicine for preoperative risk stratification/optimization/clearance.  Full consult note to follow.  Georgeanna Harrison M.D. Orthopaedic Surgery [facility]

## 2021-05-19 NOTE — ED Triage Notes (Signed)
Pt here from home via [facility] for fall. Pt was trying to get out of her recliner and she got tangled in the blanket and fell onto R side. Pt denies hitting head, no LOC. Pt c/o R hip and thigh pain, on scene pt was not able to stand, no obvious deformity. Pt states she found out this week she has a T8 fx.

## 2021-05-19 NOTE — ED Provider Notes (Signed)
[place] EMERGENCY DEPARTMENT Provider Note   CSN: [id number] Arrival date & time: 05/21/2021  2137     History Chief Complaint  Patient presents with   Grace [name] is a 70 y.o. female with osteoporosis with recent T6 T8 fracture on Coumadin for multiple heart valve replacement, CHF, HTN who presents via EMS for evaluation after a mechanical fall that occurred just prior to arrival. Patient states that her leg got tangled up in her blanket while standing up from her chair, causing her to fall and land on her right hip. Denies head injury or LOC. She was unable to stand up following the injury and was on the floor until EMS was able to come and get her in the transport bed. She denies numbness, tingling. Pain is worse when moving, or with palpation. No pain medications PTA. She has no other complaints.    Fall Pertinent negatives include no abdominal pain, no headaches and no shortness of breath.      Past Medical History:  Diagnosis Date   Anemia    Aortic insufficiency    AORTIC VALVE REPLACEMENT, HX OF 04/27/2007   Qualifier: Diagnosis of  By: Leanne Chang MD, Bruce     Cancer [facility])    left breast- surgery and chemo   CHF (congestive heart failure) ([place])    Chronic kidney disease    Stage 3   Congestive heart failure ([place]) 08/03/2009   [facility] Dr. Paul Half. Last echo 2010 with EF 30%. Restrictive due to radiation. AVR MVR. 02/2014 visit planned.     Coronary artery disease    11/06/06 [facility]): 40% oLM, 60% dLAD, 30% oRCA   Dyspnea    with exertion   Dysrhythmia    paroxsymal a-flutter 2018; intermittent left BBB (2016)   Edema of both feet    GI bleed    Heart murmur    mitral and aortic valve replacement   Hodgkin's disease    ~1974, s/p Mantle field radiation and chemo   Hyperlipidemia    Hypertension    Hypothyroidism    Migraine    none since 2008   MITRAL VALVE REPLACEMENT, HX OF 04/27/2007   Qualifier: Diagnosis of  By: Leanne Chang MD, Bruce      Pericarditis    s/p pericardidal stripping through lateral thoracotomy 01/2007 [facility])   Stroke ([place]) 2008   No residual effects   TIA (transient ischemic attack)    Tricuspid regurgitation    Varicose veins of both lower extremities    left worse than right- have gotten larger in April 2020   WPW (Wolff-Parkinson-White syndrome)     Patient Active Problem List   Diagnosis Date Noted   Chronic venous insufficiency 10/31/2020   Anemia 10/28/2019   Diverticulosis of colon without hemorrhage 09/18/2019   Schatzki's ring 09/18/2019   Hiatal hernia 09/18/2019   History of colonic polyps 02/26/2019   Thoracic compression fracture, with delayed healing, subsequent encounter 12/10/2018   Hx of long-term (current) use of anticoagulants 07/22/2018   Atrial flutter ([place]) 12/20/2016   Hypertension 12/20/2016   CKD (chronic kidney disease), stage III ([place]) 01/18/2014   Osteoporosis 01/17/2014   H/O aortic valve replacement and mitral valve replacement. 01/07/2013   Long term (current) use of anticoagulants 12/23/2012   Iron deficiency anemia 08/16/2009   WOLFF (WOLFE)-PARKINSON-WHITE (WPW) SYNDROME 08/16/2009   Congestive heart failure ([place]) 08/03/2009   Hyperlipidemia 08/29/2008   TRANSIENT ISCHEMIC ATTACKS, HX OF 04/27/2007   Hypothyroidism 11/03/2006  COMMON MIGRAINE 11/03/2006   BREAST CANCER, HX OF 11/03/2006   History of Hodgkin's disease 11/03/2006   GASTROINTESTINAL HEMORRHAGE, HX OF 11/03/2006   CAD (coronary artery disease) 07/21/2006    Past Surgical History:  Procedure Laterality Date   AORTIC VALVE SURGERY     19 mm mechanical Regent AVR 05/2006 [facility], Dr. Lajuana Matte)   bilateral breast implants     BIOPSY  02/22/2019   Procedure: BIOPSY;  Surgeon: Irving Copas., MD;  Location: [place] ENDOSCOPY;  Service: Gastroenterology;;   COLONOSCOPY WITH PROPOFOL N/A 02/22/2019   Procedure: COLONOSCOPY WITH PROPOFOL;  Surgeon: Irving Copas., MD;  Location: [place];  Service: Gastroenterology;  Laterality: N/A;   ENDOMETRIAL ABLATION     ENDOSCOPIC MUCOSAL RESECTION  01/10/2020   Procedure: ENDOSCOPIC MUCOSAL RESECTION;  Surgeon: Rush Landmark Telford Nab., MD;  Location: [place];  Service: Gastroenterology;;   ENTEROSCOPY N/A 02/22/2019   Procedure: ENTEROSCOPY;  Surgeon: Rush Landmark Telford Nab., MD;  Location: [place];  Service: Gastroenterology;  Laterality: N/A;   ESOPHAGOGASTRODUODENOSCOPY (EGD) WITH PROPOFOL N/A 01/10/2020   Procedure: ESOPHAGOGASTRODUODENOSCOPY (EGD) WITH PROPOFOL;  Surgeon: Rush Landmark Telford Nab., MD;  Location: [place];  Service: Gastroenterology;  Laterality: N/A;   EXPLORATORY LAPAROTOMY     HEMOSTASIS CLIP PLACEMENT  02/22/2019   Procedure: HEMOSTASIS CLIP PLACEMENT;  Surgeon: Irving Copas., MD;  Location: [place];  Service: Gastroenterology;;   HEMOSTASIS CLIP PLACEMENT  01/10/2020   Procedure: HEMOSTASIS CLIP PLACEMENT;  Surgeon: Irving Copas., MD;  Location: [place];  Service: Gastroenterology;;   KYPHOPLASTY N/A 12/10/2018   Procedure: KYPHOPLASTY THORACIC SIX- THORACIC SEVEN;  Surgeon: Newman Pies, MD;  Location: [place];  Service: Neurosurgery;  Laterality: N/A;  KYPHOPLASTY THORACIC SIX- THORACIC SEVEN   MASTECTOMY     MITRAL VALVE REPLACEMENT     25 mm St. Jude MVR, 05/2006, Dr. Lajuana Matte, [place]   pericardial stripping  9/08   POLYPECTOMY  02/22/2019   Procedure: POLYPECTOMY;  Surgeon: Mansouraty, Telford Nab., MD;  Location: [place];  Service: Gastroenterology;;   SPLENECTOMY     ~ [place] INJECTION  01/10/2020   Procedure: SUBMUCOSAL LIFTING INJECTION;  Surgeon: Irving Copas., MD;  Location: [place];  Service: Gastroenterology;;   SUBMUCOSAL TATTOO INJECTION  02/22/2019   Procedure: SUBMUCOSAL TATTOO INJECTION;  Surgeon: Irving Copas., MD;  Location: [place];  Service: Gastroenterology;;    THORACOTOMY     for pericardial stripping 01/2007 (Dr. Elenor Quinones, [facility])   TONSILLECTOMY     valvulopathy  06/13/06     OB History   No obstetric history on file.     Family History  Problem Relation Age of Onset   Heart disease Mother    Cancer Mother        breast   Other Father        hunting gun accident   Heart disease Maternal Aunt    Cancer Maternal Aunt        uterine   Heart disease Maternal Grandmother    Diabetes Maternal Grandmother    Hypercalcemia Neg Hx    Colon cancer Neg Hx    Esophageal cancer Neg Hx    Liver disease Neg Hx    Pancreatic cancer Neg Hx    Rectal cancer Neg Hx    Stomach cancer Neg Hx     Social History   Tobacco Use   Smoking status: Never   Smokeless tobacco: Never  Vaping Use   Vaping Use: Never used  Substance Use Topics   Alcohol use: No   Drug use: No    Home Medications Prior to Admission medications   Medication Sig Start Date End Date Taking? Authorizing Provider  amoxicillin (AMOXIL) 500 MG capsule Take 2,000 mg by mouth as directed. Take 2000 mg 1 hour prior to dental procedures    [provider]  aspirin 81 MG tablet Take 81 mg by mouth daily.    [provider]  aspirin-acetaminophen-caffeine (EXCEDRIN MIGRAINE) [phone] MG tablet Take 2 tablets by mouth every 6 (six) hours as needed for headache.    [provider]  cholecalciferol (VITAMIN D3) 25 MCG (1000 UT) tablet Take 1,000 Units by mouth daily.    [provider]  denosumab (PROLIA) 60 MG/ML SOSY injection Inject 60 mg into the skin every 6 (six) months.  04/01/19   [provider]  diphenhydramine-acetaminophen (TYLENOL PM) 25-500 MG TABS tablet Take 2 tablets by mouth at bedtime as needed (sleep).     [provider]  docusate sodium (COLACE) 100 MG capsule Take 100 mg by mouth daily.    [provider]  ezetimibe (ZETIA) 10 MG tablet Take 10 mg by mouth every evening.  12/30/17   [provider]  furosemide (LASIX) 40 MG tablet TAKE 1 TABLET EVERY DAY 05/11/21   Marin Olp, MD  levothyroxine (SYNTHROID) 100 MCG tablet TAKE 1 TABLET EVERY DAY 02/08/21   Marin Olp, MD  Magnesium 250 MG TABS Take 250 mg by mouth daily.     [provider]  metoprolol (TOPROL-XL) 50 MG 24 hr tablet Take 50 mg by mouth 2 (two) times daily.    [provider]  omeprazole (PRILOSEC) 20 MG capsule TAKE 1 CAPSULE (20 MG TOTAL) BY MOUTH DAILY 30 MINUTES BEFORE BREAKFAST OR DINNER. 05/14/21   Mansouraty, Telford Nab., MD  potassium chloride SA (KLOR-CON) 20 MEQ tablet TAKE 1 TABLET EVERY DAY 04/23/21   Marin Olp, MD  REPATHA SURECLICK 836 MG/ML SOAJ Inject 140 mg into the skin every 14 (fourteen) days. 09/14/19   [provider]  spironolactone (ALDACTONE) 25 MG tablet Take 25 mg by mouth 2 (two) times daily.    [provider]  warfarin (COUMADIN) 5 MG tablet TAKE 1 TABLET EVERY DAY EXCEPT TAKE 1/2 TABLET ON FRIDAYS AS DIRECTED 02/08/21   Marin Olp, MD    Allergies    Digoxin and Statins  Review of Systems   Review of Systems  Constitutional:  Negative for fever.  HENT: Negative.    Eyes: Negative.   Respiratory:  Negative for shortness of breath.   Cardiovascular: Negative.   Gastrointestinal:  Negative for abdominal pain and vomiting.  Endocrine: Negative.   Genitourinary: Negative.   Musculoskeletal:  Positive for arthralgias and gait problem.  Skin:  Negative for rash.  Neurological:  Negative for headaches.  All other systems reviewed and are negative.  Physical Exam Updated Vital Signs BP (!) 152/92 (BP Location: Left Arm)    Pulse 95    Temp 98.1 F (36.7 C) (Oral)    Resp 16    SpO2 96%   Physical Exam Vitals and nursing note reviewed.  Constitutional:      General: She is not in acute distress.    Appearance: She is not ill-appearing.  HENT:     Head: Atraumatic.  Eyes:     Conjunctiva/sclera: Conjunctivae normal.   Cardiovascular:  Rate and Rhythm: Normal rate and regular rhythm.     Pulses: Normal pulses.          Radial pulses are 2+ on the right side and 2+ on the left side.       Dorsalis pedis pulses are 2+ on the right side and 2+ on the left side.     Heart sounds: No murmur heard. Pulmonary:     Effort: Pulmonary effort is normal. No respiratory distress.     Breath sounds: Normal breath sounds.  Abdominal:     General: Abdomen is flat. There is no distension.     Palpations: Abdomen is soft.     Tenderness: There is no abdominal tenderness.  Musculoskeletal:        General: Normal range of motion.     Cervical back: Normal range of motion.       Legs:     Comments: Exquisite tenderness to palpation of the right thigh into the hip.  When rotating patient onto her left side to better examine the limb, she reports "spasming" and unbearable pain. Early signs of bruising at the lateral right hip joint.  2+ DP pulses bilaterally  TTP in lumbar spine   Skin:    General: Skin is warm and dry.     Capillary Refill: Capillary refill takes less than 2 seconds.  Neurological:     General: No focal deficit present.     Mental Status: She is alert.  Psychiatric:        Mood and Affect: Mood normal.    ED Results / Procedures / Treatments   Labs (all labs ordered are listed, but only abnormal results are displayed) Labs Reviewed  CBC WITH DIFFERENTIAL/PLATELET  COMPREHENSIVE METABOLIC PANEL  PROTIME-INR    EKG None  Radiology No results found.  Procedures Procedures   Medications Ordered in ED Medications  traMADol (ULTRAM) tablet 50 mg (has no administration in time range)    ED Course  I have reviewed the triage vital signs and the nursing notes.  Pertinent labs & imaging results that were available during my care of the patient were reviewed by me and considered in my medical decision making (see chart for details).    MDM Rules/Calculators/A&P                          This patient presents to the ED for concern of hip and leg pain after fall, this involves an extensive number of treatment options, and is a complaint that carries with it a high risk of complications and morbidity.  The differential diagnosis includes femur fracture, hip fracture, hip dislocation, pelvic fracture, contusion    Additional history obtained:  Additional history obtained from husband at bedside External records from outside source obtained and reviewed including recent spinal imaging   Lab Tests:  I Ordered, reviewed, and interpreted labs.  The pertinent results include:  elevated WBC at 13.8, CMP unremarkable, INR 2.9   Imaging Studies ordered:  I ordered imaging studies including CT abdomen/pelvis/L spine, pelvic xray, femur xray, chest xray  I independently visualized and interpreted imaging which showed  Ct abd/pelvis/L spine -right femoral neck fracture, displaced right sacral fracture; anterolisthesis at L4-L5 Femur xray-right femoral neck fracture CXR -no acute abnormality I agree with the radiologist interpretation    Medicines ordered and prescription drug management:  I ordered medication including Tramadol  for pain  Reevaluation of the patient after these medicines showed  that the patient stayed the same. I gave her 1mg  Dilaudid for pain with improvement I have reviewed the patients home medicines and have made adjustments as needed   Consultations Obtained:  I requested consultation with the orthopedic on call,  and discussed lab and imaging findings as well as pertinent plan - they recommend: admit to hospital service and they will see patient in morning to plan her surgery. He also recommended that patient switch from coumadin for lovenox pending her surgery.   Reevaluation:  After the interventions noted above, I reevaluated the patient and found that they have :improved   Dispostion:  After consideration of the diagnostic results and  the patients response to treatment feel that the patent would benefit from admission. Patient admitted to hospital service by Dr. Jonelle Sidle for pain management until ortho consult in the morning. Plan discussed with patient and husband at bedside. All questions asked and answered. They understand the diagnosis and agree to plan as described,.    Final Clinical Impression(s) / ED Diagnoses Final diagnoses:  Closed fracture of neck of right femur, initial encounter [facility])    Rx / DC Orders ED Discharge Orders     None        Tonye Pearson, PA-C 05/02/2021 1340    Drenda Freeze, MD 04/26/2021 [phone]

## 2021-05-20 ENCOUNTER — Encounter (HOSPITAL_COMMUNITY): Payer: Self-pay | Admitting: Internal Medicine

## 2021-05-20 ENCOUNTER — Inpatient Hospital Stay (HOSPITAL_COMMUNITY): Payer: Medicare PPO

## 2021-05-20 DIAGNOSIS — E785 Hyperlipidemia, unspecified: Secondary | ICD-10-CM

## 2021-05-20 DIAGNOSIS — N183 Chronic kidney disease, stage 3 unspecified: Secondary | ICD-10-CM

## 2021-05-20 DIAGNOSIS — E039 Hypothyroidism, unspecified: Secondary | ICD-10-CM

## 2021-05-20 DIAGNOSIS — Z952 Presence of prosthetic heart valve: Secondary | ICD-10-CM

## 2021-05-20 DIAGNOSIS — I5022 Chronic systolic (congestive) heart failure: Secondary | ICD-10-CM

## 2021-05-20 DIAGNOSIS — Z01810 Encounter for preprocedural cardiovascular examination: Secondary | ICD-10-CM | POA: Diagnosis not present

## 2021-05-20 DIAGNOSIS — S72001A Fracture of unspecified part of neck of right femur, initial encounter for closed fracture: Secondary | ICD-10-CM

## 2021-05-20 DIAGNOSIS — I2510 Atherosclerotic heart disease of native coronary artery without angina pectoris: Secondary | ICD-10-CM

## 2021-05-20 DIAGNOSIS — I428 Other cardiomyopathies: Secondary | ICD-10-CM

## 2021-05-20 DIAGNOSIS — I483 Typical atrial flutter: Secondary | ICD-10-CM

## 2021-05-20 LAB — COMPREHENSIVE METABOLIC PANEL
ALT: 21 U/L (ref 0–44)
AST: 30 U/L (ref 15–41)
Albumin: 3.4 g/dL — ABNORMAL LOW (ref 3.5–5.0)
Alkaline Phosphatase: 70 U/L (ref 38–126)
Anion gap: 6 (ref 5–15)
BUN: 19 mg/dL (ref 8–23)
CO2: 26 mmol/L (ref 22–32)
Calcium: 8.1 mg/dL — ABNORMAL LOW (ref 8.9–10.3)
Chloride: 102 mmol/L (ref 98–111)
Creatinine, Ser: 0.97 mg/dL (ref 0.44–1.00)
GFR, Estimated: >60 mL/min (ref >60)
Glucose, Bld: 172 mg/dL — ABNORMAL HIGH (ref 70–99)
Potassium: 4.3 mmol/L (ref 3.5–5.1)
Sodium: 134 mmol/L — ABNORMAL LOW (ref 135–145)
Total Bilirubin: 0.7 mg/dL (ref 0.3–1.2)
Total Protein: 6.7 g/dL (ref 6.5–8.1)

## 2021-05-20 LAB — CBC
HCT: 35.3 % — ABNORMAL LOW (ref 36.0–46.0)
Hemoglobin: 11.1 g/dL — ABNORMAL LOW (ref 12.0–15.0)
MCH: 28.4 pg (ref 26.0–34.0)
MCHC: 31.4 g/dL (ref 30.0–36.0)
MCV: 90.3 fL (ref 80.0–100.0)
Platelets: 455 10*3/uL — ABNORMAL HIGH (ref 150–400)
RBC: 3.91 MIL/uL (ref 3.87–5.11)
RDW: 15.9 % — ABNORMAL HIGH (ref 11.5–15.5)
WBC: 21.1 10*3/uL — ABNORMAL HIGH (ref 4.0–10.5)
nRBC: 0.1 % (ref 0.0–0.2)

## 2021-05-20 LAB — ECHOCARDIOGRAM COMPLETE
AV Mean grad: 7 mmHg
AV Peak grad: 12.8 mmHg
Ao pk vel: 1.79 m/s
Area-P 1/2: 5.27 cm2
Height: 63.5 in
Weight: 2211.65 oz

## 2021-05-20 LAB — VITAMIN D 25 HYDROXY (VIT D DEFICIENCY, FRACTURES): Vit D, 25-Hydroxy: 48.04 ng/mL (ref 30–100)

## 2021-05-20 LAB — PROTIME-INR
INR: 3.1 — ABNORMAL HIGH (ref 0.8–1.2)
Prothrombin Time: 31.9 seconds — ABNORMAL HIGH (ref 11.4–15.2)

## 2021-05-20 LAB — RESP PANEL BY RT-PCR (FLU A&B, COVID) ARPGX2
Influenza A by PCR: NEGATIVE
Influenza B by PCR: NEGATIVE
SARS Coronavirus 2 by RT PCR: NEGATIVE

## 2021-05-20 LAB — HIV ANTIBODY (ROUTINE TESTING W REFLEX): HIV Screen 4th Generation wRfx: NONREACTIVE

## 2021-05-20 MED ORDER — PHYTONADIONE 5 MG PO TABS
10.0000 mg | ORAL_TABLET | Freq: Once | ORAL | Status: AC
  Administered 2021-05-20: 19:00:00 10 mg via ORAL
  Filled 2021-05-20: qty 2

## 2021-05-20 MED ORDER — METOPROLOL SUCCINATE ER 50 MG PO TB24
50.0000 mg | ORAL_TABLET | Freq: Two times a day (BID) | ORAL | Status: DC
  Administered 2021-05-20 – 2021-05-28 (×17): 50 mg via ORAL
  Filled 2021-05-20 (×17): qty 1

## 2021-05-20 MED ORDER — HYDRALAZINE HCL 20 MG/ML IJ SOLN: 10.0000 mg | INTRAMUSCULAR | Status: DC | PRN

## 2021-05-20 MED ORDER — OXYCODONE HCL 5 MG PO TABS
5.0000 mg | ORAL_TABLET | ORAL | Status: DC | PRN
  Administered 2021-05-20 – 2021-05-24 (×6): 5 mg via ORAL
  Filled 2021-05-20 (×6): qty 1

## 2021-05-20 MED ORDER — ONDANSETRON HCL 4 MG PO TABS: 4.0000 mg | ORAL_TABLET | Freq: Four times a day (QID) | ORAL | Status: DC | PRN

## 2021-05-20 MED ORDER — DOCUSATE SODIUM 100 MG PO CAPS
100.0000 mg | ORAL_CAPSULE | Freq: Every evening | ORAL | Status: DC
  Administered 2021-05-20 – 2021-05-22 (×2): 100 mg via ORAL
  Filled 2021-05-20 (×3): qty 1

## 2021-05-20 MED ORDER — VITAMIN D 25 MCG (1000 UNIT) PO TABS
1000.0000 [IU] | ORAL_TABLET | Freq: Every day | ORAL | Status: DC
  Administered 2021-05-20 – 2021-05-28 (×9): 1000 [IU] via ORAL
  Filled 2021-05-20 (×9): qty 1

## 2021-05-20 MED ORDER — DEXTROSE IN LACTATED RINGERS 5 % IV SOLN: INTRAVENOUS | Status: DC

## 2021-05-20 MED ORDER — SENNOSIDES-DOCUSATE SODIUM 8.6-50 MG PO TABS: 1.0000 | ORAL_TABLET | Freq: Every evening | ORAL | Status: DC | PRN

## 2021-05-20 MED ORDER — EZETIMIBE 10 MG PO TABS
10.0000 mg | ORAL_TABLET | Freq: Every evening | ORAL | Status: DC
  Administered 2021-05-20 – 2021-05-28 (×8): 10 mg via ORAL
  Filled 2021-05-20 (×8): qty 1

## 2021-05-20 MED ORDER — TRAZODONE HCL 50 MG PO TABS
50.0000 mg | ORAL_TABLET | Freq: Every evening | ORAL | Status: DC | PRN
  Administered 2021-05-27 – 2021-05-28 (×2): 50 mg via ORAL
  Filled 2021-05-20 (×2): qty 1

## 2021-05-20 MED ORDER — LEVOTHYROXINE SODIUM 100 MCG PO TABS
100.0000 ug | ORAL_TABLET | Freq: Every day | ORAL | Status: DC
  Administered 2021-05-20 – 2021-05-29 (×10): 100 ug via ORAL
  Filled 2021-05-20 (×10): qty 1

## 2021-05-20 MED ORDER — ACETAMINOPHEN 325 MG PO TABS: 650.0000 mg | ORAL_TABLET | Freq: Four times a day (QID) | ORAL | Status: DC | PRN

## 2021-05-20 MED ORDER — ONDANSETRON HCL 4 MG/2ML IJ SOLN
4.0000 mg | Freq: Four times a day (QID) | INTRAMUSCULAR | Status: DC | PRN
  Administered 2021-05-20 (×2): 4 mg via INTRAVENOUS
  Filled 2021-05-20 (×2): qty 2

## 2021-05-20 MED ORDER — HYDROMORPHONE HCL 1 MG/ML IJ SOLN
0.5000 mg | INTRAMUSCULAR | Status: DC | PRN
  Administered 2021-05-20: 06:00:00 1 mg via INTRAVENOUS
  Filled 2021-05-20: qty 1

## 2021-05-20 MED ORDER — LEVOTHYROXINE SODIUM 100 MCG PO TABS: 100.0000 ug | ORAL_TABLET | Freq: Every day | ORAL | Status: DC

## 2021-05-20 MED ORDER — PANTOPRAZOLE SODIUM 40 MG PO TBEC
40.0000 mg | DELAYED_RELEASE_TABLET | Freq: Every day | ORAL | Status: DC
  Administered 2021-05-20 – 2021-05-28 (×9): 40 mg via ORAL
  Filled 2021-05-20 (×9): qty 1

## 2021-05-20 MED ORDER — METOPROLOL TARTRATE 5 MG/5ML IV SOLN: 5.0000 mg | INTRAVENOUS | Status: DC | PRN

## 2021-05-20 MED ORDER — MORPHINE SULFATE (PF) 2 MG/ML IV SOLN
2.0000 mg | INTRAVENOUS | Status: DC | PRN
  Administered 2021-05-20 (×2): 2 mg via INTRAVENOUS
  Filled 2021-05-20 (×3): qty 1

## 2021-05-20 MED ORDER — IPRATROPIUM-ALBUTEROL 0.5-2.5 (3) MG/3ML IN SOLN: 3.0000 mL | RESPIRATORY_TRACT | Status: DC | PRN

## 2021-05-20 MED ORDER — METOCLOPRAMIDE HCL 5 MG/ML IJ SOLN
10.0000 mg | Freq: Three times a day (TID) | INTRAMUSCULAR | Status: DC | PRN
  Administered 2021-05-20: 13:00:00 10 mg via INTRAVENOUS
  Filled 2021-05-20: qty 2

## 2021-05-20 MED ORDER — DM-GUAIFENESIN ER 30-600 MG PO TB12
1.0000 | ORAL_TABLET | Freq: Two times a day (BID) | ORAL | Status: DC | PRN
  Filled 2021-05-20: qty 1

## 2021-05-20 NOTE — Progress Notes (Signed)
PT Cancellation Note  Patient Details Name: Grace [name] MRN: [id number] DOB: Dec 23, 1950   Cancelled Treatment:    Reason Eval/Treat Not Completed: Patient with displaced R femoral neck fx, note plans for OR (likely THA vs. hemiarthroplasty). Will follow-up for PT Evaluation post-op.  Mabeline Caras, PT, DPT Acute Rehabilitation Services  Pager [phone] Office [place] 05/20/2021, 10:19 AM

## 2021-05-20 NOTE — ED Notes (Signed)
Pt provided with sprite.

## 2021-05-20 NOTE — Consult Note (Signed)
Orthopaedic Consult  Date/Time: 05/20/21 8:46 AM  Patient Name: Grace [name]  Attending Physician: Damita Lack, MD   Time first seen by orthopaedics: 1000  ASSESSMENT & PLAN  Orthopaedic Assessment: 70 y.o. female with right displaced femoral neck fracture and right nondisplaced sacral fracture.  Multiple medical comorbidities including aortic valve replacement, A. fib on warfarin, CHF, CKD stage III, and history of TIA.  Last took warfarin 05/20/2019 2 PM (5 mg).  Reductions/Procedures/Splinting/Anesthesia Performed: Reductions: None Splinting/casting: None Procedure(s): None Anesthesia: N/A  Plan:  Right displaced femoral neck fracture: Had a long discussion with patient about this injury and treatment options.  Patient will likely benefit from hemiarthroplasty versus THA.  We will try to get this done 05/21/2021 or 05/22/2021 at the latest.  Explained to the patient that surgery may not be with me, and that we may also transfer her to [place] depending on Psychologist, sport and exercise and OR availability. Right nondisplaced sacral fracture: This is a stable injury.  Recommend treating this closed, without manipulation.  This injury would be amenable to treatment with appropriate pain control and weightbearing as tolerated.  Anticipate treatment accordingly following treatment of ipsilateral displaced femoral neck fracture.   Grace [name] M.D. Orthopaedic Surgery [facility]   Medical Decision Making  Amount/complexity of data: Is there a pathologic fracture (e.g. neoplastic, osteoporotic insufficiency fracture)? Yes Independent interpretation of radiographic studies: Yes Review of radiology results (e.g. reports): Yes Tests ordered (e.g. additional radiographic studies, labs): No Lab results reviewed: Yes Reviewed old records: Yes History from another source (independent historian, e.g. family/friend/etc.): No Risk: Patient receiving IV controlled substances  for pain: Yes Fracture requiring manipulation: No Urgent or emergent (non-elective) surgery likely this admission: Yes Presence of medical comorbidities and/or surgical risk factors (e.g. current smoker, CAD, diabetes, COPD, CKD, etc.): Yes Closed fracture management WITHOUT manipulation: Yes Urgent minor procedure (e.g. joint aspiration, compartment pressure measurement, etc.): No Will likely need surgery as an outpatient: No     HPI Grace [name] is a 70 y.o. female. Orthopaedic consultation has specifically been requested to address this patient's current musculoskeletal presentation. She sustained a fall and had immediate pain in the right hip/thigh.  Pain is sharp and severe.  Pain is worse movement and better with rest and immobilization.  Since the fall the patient was not able to ambulate or bear weight due to pain.  At baseline the patient is ambulatory with no assistive devices.  Patient is on chronic anticoagulation with warfarin.  She reports her last dose of warfarin was 05/20/2019 2 in the evening.  She took 5 mg.   PMH Past Medical History:  Diagnosis Date   Anemia    Aortic insufficiency    AORTIC VALVE REPLACEMENT, HX OF 04/27/2007   Qualifier: Diagnosis of  By: Leanne Chang MD, Bruce     Cancer [facility])    left breast- surgery and chemo   CHF (congestive heart failure) ([place])    Chronic kidney disease    Stage 3   Congestive heart failure ([place]) 08/03/2009   [facility] Dr. Paul Half. Last echo 2010 with EF 30%. Restrictive due to radiation. AVR MVR. 02/2014 visit planned.     Coronary artery disease    11/06/06 [facility]): 40% oLM, 60% dLAD, 30% oRCA   Dyspnea    with exertion   Dysrhythmia    paroxsymal a-flutter 2018; intermittent left BBB (2016)   Edema of both feet    GI bleed    Heart murmur  mitral and aortic valve replacement   Hodgkin's disease    ~1974, s/p [place] radiation and chemo   Hyperlipidemia    Hypertension    Hypothyroidism    Migraine    none since  2008   MITRAL VALVE REPLACEMENT, HX OF 04/27/2007   Qualifier: Diagnosis of  By: Leanne Chang MD, Bruce     Pericarditis    s/p pericardidal stripping through lateral thoracotomy 01/2007 [facility])   Stroke ([place]) 2008   No residual effects   TIA (transient ischemic attack)    Tricuspid regurgitation    Varicose veins of both lower extremities    left worse than right- have gotten larger in April 2020   WPW (Wolff-Parkinson-White syndrome)      PSH Past Surgical History:  Procedure Laterality Date   AORTIC VALVE SURGERY     19 mm mechanical [employer] AVR 05/2006 ([facility], Dr. Lajuana Matte)   bilateral breast implants     BIOPSY  02/22/2019   Procedure: BIOPSY;  Surgeon: Irving Copas., MD;  Location[facility] ENDOSCOPY;  Service: Gastroenterology;;   COLONOSCOPY WITH PROPOFOL N/A 02/22/2019   Procedure: COLONOSCOPY WITH PROPOFOL;  Surgeon: Irving Copas., MD;  Location: [place];  Service: Gastroenterology;  Laterality: N/A;   ENDOMETRIAL ABLATION     ENDOSCOPIC MUCOSAL RESECTION  01/10/2020   Procedure: ENDOSCOPIC MUCOSAL RESECTION;  Surgeon: Rush Landmark Telford Nab., MD;  Location: [place];  Service: Gastroenterology;;   ENTEROSCOPY N/A 02/22/2019   Procedure: ENTEROSCOPY;  Surgeon: Rush Landmark Telford Nab., MD;  Location: [place];  Service: Gastroenterology;  Laterality: N/A;   ESOPHAGOGASTRODUODENOSCOPY (EGD) WITH PROPOFOL N/A 01/10/2020   Procedure: ESOPHAGOGASTRODUODENOSCOPY (EGD) WITH PROPOFOL;  Surgeon: Rush Landmark Telford Nab., MD;  Location: [place];  Service: Gastroenterology;  Laterality: N/A;   EXPLORATORY LAPAROTOMY     HEMOSTASIS CLIP PLACEMENT  02/22/2019   Procedure: HEMOSTASIS CLIP PLACEMENT;  Surgeon: Irving Copas., MD;  Location: [place];  Service: Gastroenterology;;   HEMOSTASIS CLIP PLACEMENT  01/10/2020   Procedure: HEMOSTASIS CLIP PLACEMENT;  Surgeon: Irving Copas., MD;  Location: [place];  Service: Gastroenterology;;    KYPHOPLASTY N/A 12/10/2018   Procedure: KYPHOPLASTY THORACIC SIX- THORACIC SEVEN;  Surgeon: Newman Pies, MD;  Location: [place];  Service: Neurosurgery;  Laterality: N/A;  KYPHOPLASTY THORACIC SIX- THORACIC SEVEN   MASTECTOMY     MITRAL VALVE REPLACEMENT     25 mm [employer] MVR, 05/2006, Dr. Lajuana Matte, [place]   pericardial stripping  9/08   POLYPECTOMY  02/22/2019   Procedure: POLYPECTOMY;  Surgeon: Mansouraty, Telford Nab., MD;  Location: [place];  Service: Gastroenterology;;   SPLENECTOMY     ~ [place] INJECTION  01/10/2020   Procedure: SUBMUCOSAL LIFTING INJECTION;  Surgeon: Irving Copas., MD;  Location: [place];  Service: Gastroenterology;;   SUBMUCOSAL TATTOO INJECTION  02/22/2019   Procedure: SUBMUCOSAL TATTOO INJECTION;  Surgeon: Irving Copas., MD;  Location: [place];  Service: Gastroenterology;;   THORACOTOMY     for pericardial stripping 01/2007 (Dr. Elenor Quinones, [facility])   [place]     valvulopathy  06/13/06   Home Medications Prior to Admission medications   Medication Sig Start Date End Date Taking? Authorizing Provider  amoxicillin (AMOXIL) 500 MG capsule Take 2,000 mg by mouth as directed. Take 2000 mg 1 hour prior to dental procedures   Yes [provider]  aspirin 81 MG tablet Take 81 mg by mouth daily.   Yes [provider]  aspirin-acetaminophen-caffeine (EXCEDRIN MIGRAINE) [phone] MG tablet Take 2 tablets by mouth every 6 (six) hours as needed for headache.   Yes [provider]  cholecalciferol (VITAMIN D3) 25 MCG (1000 UT) tablet Take 1,000 Units by mouth daily.   Yes [provider]  denosumab (PROLIA) 60 MG/ML SOSY injection Inject 60 mg into the skin every 6 (six) months.  04/01/19  Yes [provider]  diphenhydramine-acetaminophen (TYLENOL PM) 25-500 MG TABS tablet Take 2 tablets by mouth at bedtime as needed (sleep).    Yes [provider]   docusate sodium (COLACE) 100 MG capsule Take 100 mg by mouth every evening.   Yes [provider]  ezetimibe (ZETIA) 10 MG tablet Take 10 mg by mouth every evening.  12/30/17  Yes [provider]  furosemide (LASIX) 40 MG tablet TAKE 1 TABLET EVERY DAY Patient taking differently: Take 40 mg by mouth daily. 05/11/21  Yes Marin Olp, MD  levothyroxine (SYNTHROID) 100 MCG tablet TAKE 1 TABLET EVERY DAY Patient taking differently: Take 100 mcg by mouth daily before breakfast. 02/08/21  Yes Marin Olp, MD  Magnesium 250 MG TABS Take 250 mg by mouth daily with lunch.   Yes [provider]  metoprolol (TOPROL-XL) 50 MG 24 hr tablet Take 50 mg by mouth 2 (two) times daily.   Yes [provider]  omeprazole (PRILOSEC) 20 MG capsule TAKE 1 CAPSULE (20 MG TOTAL) BY MOUTH DAILY 30 MINUTES BEFORE BREAKFAST OR DINNER. Patient taking differently: Take 20 mg by mouth daily. 05/14/21  Yes Mansouraty, Telford Nab., MD  potassium chloride SA (KLOR-CON) 20 MEQ tablet TAKE 1 TABLET EVERY DAY Patient taking differently: Take 20 mEq by mouth daily. 04/23/21  Yes Marin Olp, MD  REPATHA SURECLICK 735 MG/ML SOAJ Inject 140 mg into the skin every 14 (fourteen) days. 09/14/19  Yes [provider]  spironolactone (ALDACTONE) 25 MG tablet Take 50 mg by mouth daily.   Yes [provider]  traMADol (ULTRAM) 50 MG tablet Take 50-100 mg by mouth every 6 (six) hours as needed. 05/15/21  Yes [provider]  warfarin (COUMADIN) 5 MG tablet TAKE 1 TABLET EVERY DAY EXCEPT TAKE 1/2 TABLET ON FRIDAYS AS DIRECTED Patient taking differently: Take 5 mg by mouth See admin instructions. 2.5 mg Monday,Wednesday,Friday 5 mg Tuesday,Thursday,Saturday and sunday 02/08/21  Yes Marin Olp, MD     Allergies Allergies  Allergen Reactions   Digoxin Other (See Comments)    bradycardia   Statins     myalgia     Family History Family History  Problem  Relation Age of Onset   Heart disease Mother    Cancer Mother        breast   Other Father        hunting gun accident   Heart disease Maternal Aunt    Cancer Maternal Aunt        uterine   Heart disease Maternal Grandmother    Diabetes Maternal Grandmother    Hypercalcemia Neg Hx    Colon cancer Neg Hx    Esophageal cancer Neg Hx    Liver disease Neg Hx    Pancreatic cancer Neg Hx    Rectal cancer Neg Hx    Stomach cancer Neg Hx     Social History Social History   Socioeconomic History   Marital status: Married    Spouse name: Not on file   Number of children: 0   Years of education:  Not on file   Highest education level: Not on file  Occupational History   Occupation: retired  Tobacco Use   Smoking status: Never   Smokeless tobacco: Never  Vaping Use   Vaping Use: Never used  Substance and Sexual Activity   Alcohol use: No   Drug use: No   Sexual activity: Not on file  Other Topics Concern   Not on file  Social History Narrative   Lived in [place] for 18 years. Moved here for the weather.    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      Husband retired January 2015. Married 1991 (2nd marriage). No kids.       Hobbies: walks 2 miles per day, read, work outside         [place] Strain: Low Risk    Difficulty of Paying Living Expenses: Not hard at all  Food Insecurity: No Food Insecurity   Worried About Charity fundraiser in the Last Year: Never true   Arboriculturist in the Last Year: Never true  Transportation Needs: No Transportation Needs   Lack of Transportation (Medical): No   Lack of Transportation (Non-Medical): No  Physical Activity: Sufficiently Active   Days of Exercise per Week: 3 days   Minutes of Exercise per Session: 90 min  Stress: No Stress Concern Present   Feeling of Stress : Only a little  Social Connections: Moderately  Integrated   Frequency of Communication with Friends and Family: More than three times a week   Frequency of Social Gatherings with Friends and Family: Twice a week   Attends Religious Services: 1 to 4 times per year   Active Member of [employer] or Organizations: No   Attends Music therapist: Never   Marital Status: Married  Human resources officer Violence: Not At Risk   Fear of Current or Ex-Partner: No   Emotionally Abused: No   Physically Abused: No   Sexually Abused: No     Review of Systems MSK: As noted per HPI above GI: No current Nausea/vomiting ENT: Denies sore throat, epistaxis CV: Denies chest pain  Resp: No current shortness of breath  Other than mentioned above, there are no Constitutional, Neurological, Psychiatric, ENT, Ophthalmological, Cardiovascular, Respiratory, GI, GU, Musculoskeletal, Integumentary, Lymphatic, Endocrine or Allergic issues.     Imaging  Independent interpretation of orthopaedic-relevant films: AP pelvis and multiple views right femur: Displaced femoral neck fracture.  Degenerative changes present in the acetabulum with joint space narrowing and osteophyte formation. CT pelvis: Redemonstrates displaced femoral neck fracture.  Also demonstrates nondisplaced right sacral fracture, in addition to findings consistent with healed sacral insufficiency fractures bilaterally.  Radiographic results: CT ABDOMEN PELVIS WO CONTRAST  Result Date: 05/11/2021 CLINICAL DATA:  Recent fall on the right side with pain and known right femoral fracture, initial encounter EXAM: CT ABDOMEN AND PELVIS WITHOUT CONTRAST TECHNIQUE: Multidetector CT imaging of the abdomen and pelvis was performed following the standard protocol without IV contrast. COMPARISON:  01/27/2013 FINDINGS: Lower chest: Mild scarring is noted in the lung bases. Breast implants are noted. Hepatobiliary: No focal liver abnormality is seen. No gallstones, gallbladder wall thickening, or biliary  dilatation. Pancreas: Unremarkable. No pancreatic ductal dilatation or surrounding inflammatory changes. Spleen: Surgically removed Adrenals/Urinary Tract: Right adrenal gland is within normal limits. Stable left adrenal lesion is noted consistent with the an adenoma. This measures up to 3.7  cm. Right kidney is somewhat ptotic. No renal calculi or obstructive changes are noted. Ureters are within normal limits. Bladder is well distended. Stomach/Bowel: Appendix is within normal limits. Colon shows no obstructive or inflammatory changes. No small bowel abnormality is noted. Stomach is well distended. Vascular/Lymphatic: No significant vascular findings are present. No enlarged abdominal or pelvic lymph nodes. Reproductive: Uterus and bilateral adnexa are unremarkable. Other: Postsurgical changes are noted in the upper abdomen stable from the prior exam. No free fluid is noted. Musculoskeletal: Right subcapital femoral neck fracture is noted. Femoral head is well seated. Chronic sclerosis is noted within the sacrum bilaterally consistent with healed insufficiency fractures. Undisplaced right sacral fracture is noted best seen on the sagittal images number 59 of series 7. Old rib fractures are noted on the right. No acute bony abnormality is noted. IMPRESSION: Right subcapital femoral neck fractures similar to that seen on prior plain film. Undisplaced right sacral fracture is noted best seen on the sagittal images. Adjacent sclerotic healed insufficiency fractures are noted as well. Left adrenal mass is noted likely representing an adenoma stable from the prior exam from 2014. Electronically Signed   By: Inez Catalina M.D.   On: 04/28/2021 23:23   DG Thoracic Spine 2 View  Result Date: 05/01/2021 CLINICAL DATA:  Upper back pain. History of kyphoplasty. No known injury. EXAM: THORACIC SPINE 2 VIEWS COMPARISON:  Thoracic spine 12/09/2018, thoracic spine series report 12/10/2018, thoracic spine series 11/02/2018. MRI  thoracic spine 11/23/2018. FINDINGS: Thoracic spine numbered the lowest ribbed vertebra as T12. T6 and T7 kyphoplasties again noted. Thoracic spine kyphoscoliosis. Mild-to-moderate new T8 compression fracture noted. Diffuse osteopenia and mild degenerative change. Prior median sternotomy. Prior cardiac valve replacement. Epicardial pacing wires noted. Surgical clips noted over the abdomen. IMPRESSION: 1.  Mild-to-moderate new T8 compression fracture. 2.  Stable appearance  of T6 and T7 kyphoplasties. 3.  Diffuse osteopenia and mild degenerative change. Electronically Signed   By: Marcello Moores  [name] M.D.   On: 05/01/2021 08:38   DG Pelvis Portable  Result Date: 05/26/2021 CLINICAL DATA:  Recent trip and fall with pelvic pain, initial encounter EXAM: PORTABLE PELVIS 1 VIEWS COMPARISON:  None. FINDINGS: Right subcapital femoral neck fracture is noted with impaction at the fracture site. Pelvic ring is intact. No other focal abnormality is seen. IMPRESSION: Right subcapital femoral neck fracture. Electronically Signed   By: Inez Catalina M.D.   On: 05/17/2021 22:54   CT L-SPINE NO CHARGE  Result Date: 05/07/2021 CLINICAL DATA:  Fall EXAM: CT LUMBAR SPINE WITHOUT CONTRAST TECHNIQUE: Multidetector CT imaging of the lumbar spine was performed without intravenous contrast administration. Multiplanar CT image reconstructions were also generated. COMPARISON:  None. FINDINGS: Segmentation: 5 lumbar type vertebrae. Alignment: Grade 1 anterolisthesis at L4-5 Vertebrae: No acute fracture or focal pathologic process. Paraspinal and other soft tissues: Negative. Disc levels: No spinal canal or neural foraminal stenosis. IMPRESSION: Grade 1 anterolisthesis at L4-5 without spinal canal or neural foraminal stenosis. Electronically Signed   By: Ulyses Jarred M.D.   On: 05/20/2021 23:40   DG Chest Port 1 View  Result Date: 05/09/2021 CLINICAL DATA:  Recent fall with chest pain, initial encounter EXAM: PORTABLE CHEST 1 VIEW  COMPARISON:  06/23/2018 FINDINGS: Cardiac shadow is mildly prominent. Postsurgical changes are again seen. Bilateral breast implants are noted causing generalized density over the chest bilaterally. Mild vascular congestion is seen. No focal infiltrate or effusion is noted. No acute bony abnormality is seen. Multiple old rib fractures are noted on  the right. IMPRESSION: Mild vascular congestion. Generalized density over the chest is related to overlying breast implants. No other acute abnormality is noted. Electronically Signed   By: Inez Catalina M.D.   On: 04/27/2021 22:51   DG Femur Portable Min 2 Views Right  Result Date: 05/20/2021 CLINICAL DATA:  Recent fall with right leg pain, initial encounter EXAM: RIGHT FEMUR PORTABLE 2 VIEW COMPARISON:  None. FINDINGS: Subcapital right femoral neck fracture is noted with impaction and angulation at the fracture site. The more distal femur appears within normal limits. IMPRESSION: Right femoral neck fracture as described. Electronically Signed   By: Inez Catalina M.D.   On: 04/27/2021 22:54   Labs  Recent Labs    05/12/2021 2147  WBC 13.8*  HGB 11.7*  HCT 38.2  PLT 501*   Recent Labs    05/10/2021 2147  NA 136  K 4.0  CL 102  CO2 27  BUN 22  CREATININE 1.04*  GLUCOSE 95  CALCIUM 8.3*   Lab Results  Component Value Date   INR 2.9 (H) 05/05/2021   INR 3.0 05/08/2021   INR 4.1 (A) 04/17/2021        Physical Examination  Patient is a 70 y.o. year old female who is alert, well appearing, and in no distress, mood is calm.  Orientation: oriented to person, place, time, and general circumstances  Vital Signs: BP 124/71 (BP Location: Left Arm)    Pulse 97    Temp (!) 97.4 F (36.3 C) (Oral)    Resp 20    SpO2 93%    Gait: Supine in hospital bed.  Unable to ambulate due to injury.  Heart: Normal rate Lungs: Non-labored breathing Abdomen: Soft, Non-tender   Right Upper Extremity: Inspection: Atraumatic Palpation: Nontender ROM: Full,  painless Joint Stability: No instability Strength: Normal Skin: Intact Peripheral Vascular: Well perfused Reflexes: No pathologic Sensation: Intact to light touch distally Lymph Nodes: None Palpable Coordination: Intact, normal   Left Upper Extremity: Inspection: Atraumatic Palpation: Nontender ROM: Full, painless Joint Stability: No instability Strength: Normal Skin: Intact Peripheral Vascular: Well perfused Reflexes: No pathologic Sensation: Intact to light touch distally Lymph Nodes: None Palpable Coordination: Intact, normal    Right Lower Extremity: Inspection: Shortened, externally rotated Palpation: Tender of the groin, as well as posteriorly over the sacrum ROM: Motion severely limited due to pain and injury Joint Stability: Unable to evaluate hip stability due to fracture; stable distally Strength: +DF/PF/EHL Skin: Intact Peripheral Vascular: 2+DP, WWP Reflexes: No pathologic Sensation: SILT SP/DP/T Lymph Nodes: None Palpable Coordination: Limited due to injury   Left Lower Extremity: Inspection: Atraumatic Palpation: Nontender ROM: Full, painless Joint Stability: No instability Strength: Normal Skin: Intact Peripheral Vascular: Well perfused Reflexes: No pathologic Sensation: Intact to light touch distally Lymph Nodes: None Palpable Coordination: Intact, normal    Pelvis: Skin: Intact  Palpation: Tender to palpation posteriorly over sacrum Stability: No instability      The review of the patient's medications does not in any way constitute an endorsement, by this clinician,  of their use, dosage, indications, route, efficacy, interactions, or other clinical parameters.  This note was generated within the [employer] EMR using Dragon medical speech recognition software and may contain inherent errors or omissions not intended by the user. Grammatical and punctuation errors, random word insertions, deletions, pronoun errors and incomplete sentences are  occasional consequences of this technology due to software limitations. Not all errors are caught or corrected.  Although every attempt is made to root  out erroneus and incomplete transcription, the note may still not fully represent the intent or opinion of the author. If there are questions or concerns about the content of this note or information contained within the body of this dictation they should be addressed directly with the author for clarification.

## 2021-05-20 NOTE — Progress Notes (Signed)
PROGRESS NOTE    Grace [name]  OHY:[id number] DOB: 20-Jan-1951 DOA: 05/22/2021 PCP: Marin Olp, MD   Brief Narrative:  70 year old with history of aortic and mitral valve replacement with mechanical valve, A. fib on Coumadin, systolic CHF, CKD stage IIIa, history of breast cancer, aortic insufficiency, Hodgkin's disease, history of TIA came to the ER with complaints of mechanical fall sustaining right femoral neck fracture confirmed on the CT scan.  Denies any loss of consciousness.  Orthopedic was consulted.  Chest x-ray showed mild vascular congestion.   Assessment & Plan:   Principal Problem:   Closed right femoral fracture (HCC) Active Problems:   Hypothyroidism   Hyperlipidemia   Congestive heart failure (HCC)   History of cardiovascular disorder   CKD (chronic kidney disease), stage III (HCC)   Atrial flutter (HCC)   Hx of long-term (current) use of anticoagulants   CAD (coronary artery disease)   Mechanical fall causing displaced right femoral neck fracture - Orthopedic consulted.  INR is 2.9, ideally would want INR to drop to below 1.5 for surgery.  In the meantime start heparin drip.  Orthopedic is following.  Pain control, bowel regimen.  Postop will need PT/OT.  Nausea and vomiting - She is passing gas and had a bowel movement.  May be from IV pain medication, will change to IV morphine.  Continue Zofran and Reglan as needed.  History of mechanical valve replacement, aortic and mitral - Currently patient is on Coumadin, allowing INR to drop for surgery.  In the meantime we will start heparin drip.  Goal INR is 2.5-3.5.  Patient follows with [facility]  Hypothyroidism - Synthroid  Mild systolic CHF congestive heart failure reduced ejection fraction, EF 35% - Last echo I can find in epic under care everywhere is in January 2019 showing EF of 35%.  Mechanical prosthetic aortic and mitral valve noted - We will repeat echocardiogram.  Consulted cardiology, Dr.  Curt Bears to help with periodic management - Home meds Lasix, Toprol-XL, Aldactone  CKD stage II -Renal function at baseline around 1.2 Cr  History of atrial fibrillation with flutter - On Coumadin  Coronary artery disease - Chest pain-free  History of constrictive pericarditis - Status post pericardiectomy  Hodgkin's lymphoma History of breast cancer status post bilateral mastectomy and reconstruction - Status post radiation and chemo.  Dr. Alen Blew outpatient  Iron deficiency anemia - Follows outpatient Dr. Alen Blew  Chronic venous insufficiency - Follows outpatient vascular, Dr. Doren Custard  DVT prophylaxis: On Coumadin Code Status: Full code Family Communication:  Corena Herter; no answer. No VM option.  Status is: Inpatient  Remains inpatient appropriate because: Maintain hospital stay for orthopedic evaluation and surgery once INR drifts down          Subjective: During my visit patient was nauseous, she thinks it was secondary to IV Dilaudid.  Otherwise she is feeling okay no complaints at this time besides hip pain  Review of Systems Otherwise negative except as per HPI, including: General: Denies fever, chills, night sweats or unintended weight loss. Resp: Denies cough, wheezing, shortness of breath. Cardiac: Denies chest pain, palpitations, orthopnea, paroxysmal nocturnal dyspnea. GI: Denies abdominal pain, nausea, vomiting, diarrhea or constipation GU: Denies dysuria, frequency, hesitancy or incontinence MS: Denies muscle aches, joint pain or swelling Neuro: Denies headache, neurologic deficits (focal weakness, numbness, tingling), abnormal gait Psych: Denies anxiety, depression, SI/HI/AVH Skin: Denies new rashes or lesions ID: Denies sick contacts, exotic exposures, travel  Examination:  General exam: Appears calm and comfortable  Respiratory system: Clear to auscultation. Respiratory effort normal. Cardiovascular system: S1 & S2 heard, RRR. No JVD,  murmurs, rubs, gallops or clicks. No pedal edema. Gastrointestinal system: Abdomen is nondistended, soft and nontender. No organomegaly or masses felt. Normal bowel sounds heard. Central nervous system: Alert and oriented. No focal neurological deficits. Extremities: Symmetric 5 x 5 power.  Limited range of motion of the right lower extremity Skin: No rashes, lesions or ulcers Psychiatry: Judgement and insight appear normal. Mood & affect appropriate.     Objective: Vitals:   05/20/21 0500 05/20/21 0515 05/20/21 0534 05/20/21 0555  BP: 124/86 133/68  124/71  Pulse: 93 94  97  Resp: 18 18  20   Temp:   98.1 F (36.7 C) (!) 97.4 F (36.3 C)  TempSrc:   Oral Oral  SpO2: 96% 93%  93%   No intake or output data in the 24 hours ending 05/20/21 0833 There were no vitals filed for this visit.   Data Reviewed:   CBC: Recent Labs  Lab 05/10/2021 2147  WBC 13.8*  NEUTROABS 10.9*  HGB 11.7*  HCT 38.2  MCV 92.0  PLT 409*   Basic Metabolic Panel: Recent Labs  Lab 05/20/2021 2147  NA 136  K 4.0  CL 102  CO2 27  GLUCOSE 95  BUN 22  CREATININE 1.04*  CALCIUM 8.3*   GFR: CrCl cannot be calculated (Unknown ideal weight.). Liver Function Tests: Recent Labs  Lab 04/28/2021 2147  AST 31  ALT 21  ALKPHOS 72  BILITOT 0.5  PROT 7.0  ALBUMIN 3.7   No results for input(s): LIPASE, AMYLASE in the last 168 hours. No results for input(s): AMMONIA in the last 168 hours. Coagulation Profile: Recent Labs  Lab 05/26/2021 2147  INR 2.9*   Cardiac Enzymes: No results for input(s): CKTOTAL, CKMB, CKMBINDEX, TROPONINI in the last 168 hours. BNP (last 3 results) No results for input(s): PROBNP in the last 8760 hours. HbA1C: No results for input(s): HGBA1C in the last 72 hours. CBG: No results for input(s): GLUCAP in the last 168 hours. Lipid Profile: No results for input(s): CHOL, HDL, LDLCALC, TRIG, CHOLHDL, LDLDIRECT in the last 72 hours. Thyroid Function Tests: No results for  input(s): TSH, T4TOTAL, FREET4, T3FREE, THYROIDAB in the last 72 hours. Anemia Panel: No results for input(s): VITAMINB12, FOLATE, FERRITIN, TIBC, IRON, RETICCTPCT in the last 72 hours. Sepsis Labs: No results for input(s): PROCALCITON, LATICACIDVEN in the last 168 hours.  Recent Results (from the past 240 hour(s))  Resp Panel by RT-PCR (Flu A&B, Covid) Nasopharyngeal Swab     Status: None   Collection Time: 05/20/21 12:02 AM   Specimen: Nasopharyngeal Swab; Nasopharyngeal(NP) swabs in vial transport medium  Result Value Ref Range Status   SARS Coronavirus 2 by RT PCR NEGATIVE NEGATIVE Final    Comment: (NOTE) SARS-CoV-2 target nucleic acids are NOT DETECTED.  The SARS-CoV-2 RNA is generally detectable in upper respiratory specimens during the acute phase of infection. The lowest concentration of SARS-CoV-2 viral copies this assay can detect is 138 copies/mL. A negative result does not preclude SARS-Cov-2 infection and should not be used as the sole basis for treatment or other patient management decisions. A negative result may occur with  improper specimen collection/handling, submission of specimen other than nasopharyngeal swab, presence of viral mutation(s) within the areas targeted by this assay, and inadequate number of viral copies(<138 copies/mL). A negative result must be combined with clinical observations, patient history, and epidemiological information. The expected result is  Negative.  Fact Sheet for Patients:  EntrepreneurPulse.com.au  Fact Sheet for Healthcare Providers:  IncredibleEmployment.be  This test is no t yet approved or cleared by the [place] FDA and  has been authorized for detection and/or diagnosis of SARS-CoV-2 by FDA under an Emergency Use Authorization (EUA). This EUA will remain  in effect (meaning this test can be used) for the duration of the COVID-19 declaration under Section 564(b)(1) of the Act,  21 U.S.C.section 360bbb-3(b)(1), unless the authorization is terminated  or revoked sooner.       Influenza A by PCR NEGATIVE NEGATIVE Final   Influenza B by PCR NEGATIVE NEGATIVE Final    Comment: (NOTE) The Xpert Xpress SARS-CoV-2/FLU/RSV plus assay is intended as an aid in the diagnosis of influenza from Nasopharyngeal swab specimens and should not be used as a sole basis for treatment. Nasal washings and aspirates are unacceptable for Xpert Xpress SARS-CoV-2/FLU/RSV testing.  Fact Sheet for Patients: EntrepreneurPulse.com.au  Fact Sheet for Healthcare Providers: IncredibleEmployment.be  This test is not yet approved or cleared by the [place] FDA and has been authorized for detection and/or diagnosis of SARS-CoV-2 by FDA under an Emergency Use Authorization (EUA). This EUA will remain in effect (meaning this test can be used) for the duration of the COVID-19 declaration under Section 564(b)(1) of the Act, 21 U.S.C. section 360bbb-3(b)(1), unless the authorization is terminated or revoked.  Performed at [facility], [place] [address], [place], [place] [address]          Radiology Studies: CT ABDOMEN PELVIS WO CONTRAST  Result Date: 05/18/2021 CLINICAL DATA:  Recent fall on the right side with pain and known right femoral fracture, initial encounter EXAM: CT ABDOMEN AND PELVIS WITHOUT CONTRAST TECHNIQUE: Multidetector CT imaging of the abdomen and pelvis was performed following the standard protocol without IV contrast. COMPARISON:  01/27/2013 FINDINGS: Lower chest: Mild scarring is noted in the lung bases. Breast implants are noted. Hepatobiliary: No focal liver abnormality is seen. No gallstones, gallbladder wall thickening, or biliary dilatation. Pancreas: Unremarkable. No pancreatic ductal dilatation or surrounding inflammatory changes. Spleen: Surgically removed Adrenals/Urinary Tract: Right adrenal gland is within normal  limits. Stable left adrenal lesion is noted consistent with the an adenoma. This measures up to 3.7 cm. Right kidney is somewhat ptotic. No renal calculi or obstructive changes are noted. Ureters are within normal limits. Bladder is well distended. Stomach/Bowel: Appendix is within normal limits. Colon shows no obstructive or inflammatory changes. No small bowel abnormality is noted. Stomach is well distended. Vascular/Lymphatic: No significant vascular findings are present. No enlarged abdominal or pelvic lymph nodes. Reproductive: Uterus and bilateral adnexa are unremarkable. Other: Postsurgical changes are noted in the upper abdomen stable from the prior exam. No free fluid is noted. Musculoskeletal: Right subcapital femoral neck fracture is noted. Femoral head is well seated. Chronic sclerosis is noted within the sacrum bilaterally consistent with healed insufficiency fractures. Undisplaced right sacral fracture is noted best seen on the sagittal images number 59 of series 7. Old rib fractures are noted on the right. No acute bony abnormality is noted. IMPRESSION: Right subcapital femoral neck fractures similar to that seen on prior plain film. Undisplaced right sacral fracture is noted best seen on the sagittal images. Adjacent sclerotic healed insufficiency fractures are noted as well. Left adrenal mass is noted likely representing an adenoma stable from the prior exam from 2014. Electronically Signed   By: Inez Catalina M.D.   On: 05/13/2021 23:23   DG Pelvis  Portable  Result Date: 05/26/2021 CLINICAL DATA:  Recent trip and fall with pelvic pain, initial encounter EXAM: PORTABLE PELVIS 1 VIEWS COMPARISON:  None. FINDINGS: Right subcapital femoral neck fracture is noted with impaction at the fracture site. Pelvic ring is intact. No other focal abnormality is seen. IMPRESSION: Right subcapital femoral neck fracture. Electronically Signed   By: Inez Catalina M.D.   On: 05/22/2021 22:54   CT L-SPINE NO  CHARGE  Result Date: 05/11/2021 CLINICAL DATA:  Fall EXAM: CT LUMBAR SPINE WITHOUT CONTRAST TECHNIQUE: Multidetector CT imaging of the lumbar spine was performed without intravenous contrast administration. Multiplanar CT image reconstructions were also generated. COMPARISON:  None. FINDINGS: Segmentation: 5 lumbar type vertebrae. Alignment: Grade 1 anterolisthesis at L4-5 Vertebrae: No acute fracture or focal pathologic process. Paraspinal and other soft tissues: Negative. Disc levels: No spinal canal or neural foraminal stenosis. IMPRESSION: Grade 1 anterolisthesis at L4-5 without spinal canal or neural foraminal stenosis. Electronically Signed   By: Ulyses Jarred M.D.   On: 05/25/2021 23:40   DG Chest Port 1 View  Result Date: 05/15/2021 CLINICAL DATA:  Recent fall with chest pain, initial encounter EXAM: PORTABLE CHEST 1 VIEW COMPARISON:  06/23/2018 FINDINGS: Cardiac shadow is mildly prominent. Postsurgical changes are again seen. Bilateral breast implants are noted causing generalized density over the chest bilaterally. Mild vascular congestion is seen. No focal infiltrate or effusion is noted. No acute bony abnormality is seen. Multiple old rib fractures are noted on the right. IMPRESSION: Mild vascular congestion. Generalized density over the chest is related to overlying breast implants. No other acute abnormality is noted. Electronically Signed   By: Inez Catalina M.D.   On: 04/30/2021 22:51   DG Femur Portable Min 2 Views Right  Result Date: 05/12/2021 CLINICAL DATA:  Recent fall with right leg pain, initial encounter EXAM: RIGHT FEMUR PORTABLE 2 VIEW COMPARISON:  None. FINDINGS: Subcapital right femoral neck fracture is noted with impaction and angulation at the fracture site. The more distal femur appears within normal limits. IMPRESSION: Right femoral neck fracture as described. Electronically Signed   By: Inez Catalina M.D.   On: 05/09/2021 22:54        Scheduled Meds: Continuous  Infusions:  dextrose 5% lactated ringers 100 mL/hr at 05/20/21 0057     LOS: 1 day   Time spent= 35 mins    Janelly Switalski Arsenio Loader, MD [facility]  If 7PM-7AM, please contact night-coverage  05/20/2021, 8:33 AM

## 2021-05-20 NOTE — Consult Note (Addendum)
Cardiology Consultation:   Patient ID: Grace [name] MRN: [id number]; DOB: 29-Apr-1951  Admit date: 05/05/2021 Date of Consult: 05/20/2021  PCP:  Grace Olp, MD   [name] Providers [name]:  [facility]     Patient Profile:   Grace [name] is a 70 y.o. female with a hx of mechanical mitral and aortic valve replacement at [facility] in 2008 on chronic Coumadin therapy, mild LV dysfunction with baseline EF 50%, PAF/flutter, constrictive pericarditis s/p pericardiectomy, HTN, HLD, hypothyroidism, osteopenia, venous insufficiency, Hodgkin's disease in 1974 s/p Mantle field radiation and chemo, left breast cancer and nonobstructive CAD diagnosed in 2008 who is being seen 05/20/2021 for the evaluation of preoperative clearance prior to R hip surgery at the request of Grace [name].  History of Present Illness:   Grace [name] is a pleasant 70 year old female with past medical history of mechanical mitral and aortic valve replacement at [facility] in 2008 on chronic Coumadin therapy, mild LV dysfunction with baseline EF 50%, PAF/flutter, constrictive pericarditis s/p pericardiectomy, HTN, HLD, hypothyroidism, osteopenia, venous insufficiency, Hodgkin's disease in 1974 s/p Mantle field radiation and chemo, left breast cancer and nonobstructive CAD diagnosed in 2008.  Patient reportedly also been diagnosed with COPD, however I am unable to locate such record.  Cardiac catheterization performed on 11/06/2006 showed normal left circumflex, 60% distal LAD lesion, 30% ostial RCA lesion, 40% ostial left main lesion.  Patient is being followed by Grace [name] before he moved to [place] [name]. Patient is scheduled to establish with a different Grace [name] in March 2023.  Her last appointment with Grace [name] was in March 2022 at which time she was doing quite well.  Her monitor obtained in February 2019 showed predominantly sinus rhythm, with rare nonsustained ectopic atrial rhythm, longest run 13  beats, occasional multifocal PVCs.  Most recent echocardiogram obtained at [facility] on 01/05/2019 revealed EF 50%, trivial AI, mild TR, mechanical mitral and aortic valve functioning normally, RV enlarged with normal function, no pericardial effusion.  Her last visit with his primary [name] was in March 2022.  As mentioned above, her [name] is moving away therefore she has been scheduled to see a new [name] in March 2023.  Patient was in her usual state of health until she suffered a mechanical fall yesterday on Christmas eve.  She got up from a chair and fell she tripped over something and fell to her right side.  This resulted in a displaced right femoral neck fracture.  She did not have any dizziness or presyncope at the time of the fall.  INR on arrival was 2.9.  She is also on aspirin.  Both has been held in anticipation of surgery.  White blood cell count was initially 13.8, however it went up to 21.1 overnight.  CT of abdomen confirmed a right subcapital femoral neck fracture, undisplaced right sacral fracture, also left adrenal mass most likely representing adenoma and a stable from the prior exam in 2014.  Chest x-ray showed mild vascular congestion, generalized density over the chest related to underlying breast implant.  Cardiology service has been consulted for preop clearance.  Talking with the patient, she lives with her husband.  She is functionally independent prior to yesterday.  She has chronic shortness of breath with moderate physical exertion which she attributed to COPD.  She says she has been diagnosed with COPD in the past, however I am unable to locate this diagnosis myself.  She did have a PFT at [facility] I am unable to  see the result.  She says her shortness of breath with exertion has been unchanged for the past several years.  She denies any recent exertional chest pain.  She thinks she can walk a block on flat ground without any issue.  Past Medical History:  Diagnosis  Date   Anemia    Aortic insufficiency    AORTIC VALVE REPLACEMENT, HX OF 04/27/2007   Qualifier: Diagnosis of  By: Grace Chang MD, Grace [name]     Cancer [name])    left breast- surgery and chemo   CHF (congestive heart failure) ([place])    Chronic kidney disease    Stage 3   Congestive heart failure ([place]) 08/03/2009   Grace Grace [name]. Last echo 2010 with EF 30%. Restrictive due to radiation. AVR MVR. 02/2014 visit planned.     Coronary artery disease    11/06/06 [facility]): 40% oLM, 60% dLAD, 30% oRCA   Dyspnea    with exertion   Dysrhythmia    paroxsymal a-flutter 2018; intermittent left BBB (2016)   Edema of both feet    GI bleed    Heart murmur    mitral and aortic valve replacement   Hodgkin's disease    ~1974, s/p Mantle field radiation and chemo   Hyperlipidemia    Hypertension    Hypothyroidism    Migraine    none since 2008   MITRAL VALVE REPLACEMENT, HX OF 04/27/2007   Qualifier: Diagnosis of  By: Grace Chang MD, Grace [name]     Pericarditis    s/p pericardidal stripping through lateral thoracotomy 01/2007 [name])   Stroke ([place]) 2008   No residual effects   TIA (transient ischemic attack)    Tricuspid regurgitation    Varicose veins of both lower extremities    left worse than right- have gotten larger in April 2020   WPW (Wolff-Parkinson-White syndrome)     Past Surgical History:  Procedure Laterality Date   AORTIC VALVE SURGERY     19 mm mechanical [facility] AVR 05/2006 ([facility], Dr. Lajuana Matte)   bilateral breast implants     BIOPSY  02/22/2019   Procedure: BIOPSY;  Surgeon: Irving Copas., MD;  Location: [facility];  Service: Gastroenterology;;   COLONOSCOPY WITH PROPOFOL N/A 02/22/2019   Procedure: COLONOSCOPY WITH PROPOFOL;  Surgeon: Irving Copas., MD;  Location: [place];  Service: Gastroenterology;  Laterality: N/A;   ENDOMETRIAL ABLATION     ENDOSCOPIC MUCOSAL RESECTION  01/10/2020   Procedure: ENDOSCOPIC MUCOSAL RESECTION;  Surgeon: Rush Landmark Telford Nab., MD;   Location: [place];  Service: Gastroenterology;;   ENTEROSCOPY N/A 02/22/2019   Procedure: ENTEROSCOPY;  Surgeon: Rush Landmark Telford Nab., MD;  Location: [place];  Service: Gastroenterology;  Laterality: N/A;   ESOPHAGOGASTRODUODENOSCOPY (EGD) WITH PROPOFOL N/A 01/10/2020   Procedure: ESOPHAGOGASTRODUODENOSCOPY (EGD) WITH PROPOFOL;  Surgeon: Rush Landmark Telford Nab., MD;  Location: [place];  Service: Gastroenterology;  Laterality: N/A;   EXPLORATORY LAPAROTOMY     HEMOSTASIS CLIP PLACEMENT  02/22/2019   Procedure: HEMOSTASIS CLIP PLACEMENT;  Surgeon: Irving Copas., MD;  Location: [place];  Service: Gastroenterology;;   HEMOSTASIS CLIP PLACEMENT  01/10/2020   Procedure: HEMOSTASIS CLIP PLACEMENT;  Surgeon: Irving Copas., MD;  Location: [place];  Service: Gastroenterology;;   KYPHOPLASTY N/A 12/10/2018   Procedure: KYPHOPLASTY THORACIC SIX- THORACIC SEVEN;  Surgeon: Newman Pies, MD;  Location: [place];  Service: Neurosurgery;  Laterality: N/A;  KYPHOPLASTY THORACIC SIX- THORACIC SEVEN   MASTECTOMY     MITRAL VALVE REPLACEMENT     25  mm [facility] [facility] MVR, 05/2006, Dr. Lajuana Matte, [place]   pericardial stripping  9/08   POLYPECTOMY  02/22/2019   Procedure: POLYPECTOMY;  Surgeon: Mansouraty, Telford Nab., MD;  Location: [place];  Service: Gastroenterology;;   SPLENECTOMY     ~ [place] INJECTION  01/10/2020   Procedure: SUBMUCOSAL LIFTING INJECTION;  Surgeon: Irving Copas., MD;  Location: [place];  Service: Gastroenterology;;   SUBMUCOSAL TATTOO INJECTION  02/22/2019   Procedure: SUBMUCOSAL TATTOO INJECTION;  Surgeon: Irving Copas., MD;  Location: [place];  Service: Gastroenterology;;   THORACOTOMY     for pericardial stripping 01/2007 (Dr. Elenor Quinones, [name])   TONSILLECTOMY     valvulopathy  06/13/06     Home Medications:  Prior to Admission medications   Medication Sig Start Date End Date  Taking? Authorizing Provider  amoxicillin (AMOXIL) 500 MG capsule Take 2,000 mg by mouth as directed. Take 2000 mg 1 hour prior to dental procedures   Yes [provider]  aspirin 81 MG tablet Take 81 mg by mouth daily.   Yes [provider]  aspirin-acetaminophen-caffeine (EXCEDRIN MIGRAINE) [phone] MG tablet Take 2 tablets by mouth every 6 (six) hours as needed for headache.   Yes [provider]  cholecalciferol (VITAMIN D3) 25 MCG (1000 UT) tablet Take 1,000 Units by mouth daily.   Yes [provider]  denosumab (PROLIA) 60 MG/ML SOSY injection Inject 60 mg into the skin every 6 (six) months.  04/01/19  Yes [provider]  diphenhydramine-acetaminophen (TYLENOL PM) 25-500 MG TABS tablet Take 2 tablets by mouth at bedtime as needed (sleep).    Yes [provider]  docusate sodium (COLACE) 100 MG capsule Take 100 mg by mouth every evening.   Yes [provider]  ezetimibe (ZETIA) 10 MG tablet Take 10 mg by mouth every evening.  12/30/17  Yes [provider]  furosemide (LASIX) 40 MG tablet TAKE 1 TABLET EVERY DAY Patient taking differently: Take 40 mg by mouth daily. 05/11/21  Yes Grace Olp, MD  levothyroxine (SYNTHROID) 100 MCG tablet TAKE 1 TABLET EVERY DAY Patient taking differently: Take 100 mcg by mouth daily before breakfast. 02/08/21  Yes Grace Olp, MD  Magnesium 250 MG TABS Take 250 mg by mouth daily with lunch.   Yes [provider]  metoprolol (TOPROL-XL) 50 MG 24 hr tablet Take 50 mg by mouth 2 (two) times daily.   Yes [provider]  omeprazole (PRILOSEC) 20 MG capsule TAKE 1 CAPSULE (20 MG TOTAL) BY MOUTH DAILY 30 MINUTES BEFORE BREAKFAST OR DINNER. Patient taking differently: Take 20 mg by mouth daily. 05/14/21  Yes Mansouraty, Telford Nab., MD  potassium chloride SA (KLOR-CON) 20 MEQ tablet TAKE 1 TABLET EVERY DAY Patient taking differently: Take 20 mEq by mouth daily. 04/23/21   Yes Grace Olp, MD  REPATHA SURECLICK 629 MG/ML SOAJ Inject 140 mg into the skin every 14 (fourteen) days. 09/14/19  Yes [provider]  spironolactone (ALDACTONE) 25 MG tablet Take 50 mg by mouth daily.   Yes [provider]  traMADol (ULTRAM) 50 MG tablet Take 50-100 mg by mouth every 6 (six) hours as needed. 05/15/21  Yes [provider]  warfarin (COUMADIN) 5 MG tablet TAKE 1 TABLET EVERY DAY EXCEPT TAKE 1/2 TABLET ON FRIDAYS AS DIRECTED Patient taking differently: Take 5 mg by mouth See admin instructions. 2.5 mg Monday,Wednesday,Friday 5 mg Tuesday,Thursday,Saturday and sunday 02/08/21  Yes Grace Olp, MD    Inpatient Medications: Scheduled Meds:  cholecalciferol  1,000 Units Oral Daily   docusate sodium  100 mg Oral QPM   ezetimibe  10 mg Oral QPM   metoprolol succinate  50 mg Oral BID   pantoprazole  40 mg Oral Daily   Continuous Infusions:  dextrose 5% lactated ringers 100 mL/hr at 05/20/21 0057   PRN Meds: acetaminophen, dextromethorphan-guaiFENesin, hydrALAZINE, HYDROmorphone (DILAUDID) injection, ipratropium-albuterol, metoprolol tartrate, ondansetron **OR** ondansetron (ZOFRAN) IV, oxyCODONE, senna-docusate, traZODone  Allergies:    Allergies  Allergen Reactions   Digoxin Other (See Comments)    bradycardia   Statins     myalgia    Social History:   Social History   Socioeconomic History   Marital status: Married    Spouse name: Not on file   Number of children: 0   Years of education: Not on file   Highest education level: Not on file  Occupational History   Occupation: retired  Tobacco Use   Smoking status: Never   Smokeless tobacco: Never  Vaping Use   Vaping Use: Never used  Substance and Sexual Activity   Alcohol use: No   Drug use: No   Sexual activity: Not on file  Other Topics Concern   Not on file  Social History Narrative   Lived in [place] for 18 years. Moved here for the weather.    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 Had to retire due to cardiac illness.      Husband retired January 2015. Married 1991 (2nd marriage). No kids.       Hobbies: walks 2 miles per day, read, work outside         [place] Strain: Low Risk    Difficulty of Paying Living Expenses: Not hard at all  Food Insecurity: No Food Insecurity   Worried About Charity fundraiser in the Last Year: Never true   Arboriculturist in the Last Year: Never true  Transportation Needs: No Transportation Needs   Lack of Transportation (Medical): No   Lack of Transportation (Non-Medical): No  Physical Activity: Sufficiently Active   Days of Exercise per Week: 3 days   Minutes of Exercise per Session: 90 min  Stress: No Stress Concern Present   Feeling of Stress : Only a little  Social Connections: Moderately Integrated   Frequency of Communication with Friends and Family: More than three times a week   Frequency of Social Gatherings with Friends and Family: Twice a week   Attends Religious Services: 1 to 4 times per year   Active Member of [employer] or Organizations: No   Attends Music therapist: Never   Marital Status: Married  Human resources officer Violence: Not At Risk   Fear of Current or Ex-Partner: No   Emotionally Abused: No   Physically Abused: No   Sexually Abused: No    Family History:    Family History  Problem Relation Age of Onset   Heart disease Mother    Cancer Mother        breast   Other Father        hunting gun accident   Heart disease Maternal Aunt    Cancer Maternal Aunt        uterine   Heart disease Maternal Grandmother    Diabetes Maternal Grandmother    Hypercalcemia Neg Hx    Colon cancer Neg Hx  Esophageal cancer Neg Hx    Liver disease Neg Hx    Pancreatic cancer Neg Hx    Rectal cancer Neg Hx    Stomach cancer Neg Hx      ROS:  Please see the history of present illness.   All other ROS  reviewed and negative.     Physical Exam/Data:   Vitals:   05/20/21 0500 05/20/21 0515 05/20/21 0534 05/20/21 0555  BP: 124/86 133/68  124/71  Pulse: 93 94  97  Resp: 18 18  20   Temp:   98.1 F (36.7 C) (!) 97.4 F (36.3 C)  TempSrc:   Oral Oral  SpO2: 96% 93%  93%   No intake or output data in the 24 hours ending 05/20/21 1008 Last 3 Weights 04/30/2021 04/04/2021 02/21/2021  Weight (lbs) 132 lb 8 oz 130 lb 9.6 oz 130 lb 4.8 oz  Weight (kg) 60.102 kg 59.24 kg 59.104 kg     There is no height or weight on file to calculate BMI.  General:  Well nourished, well developed, in no acute distress HEENT: normal Neck: no JVD Vascular: No carotid bruits; Distal pulses 2+ bilaterally Cardiac:  normal S1, S2; RRR; no murmur  Lungs:  clear to auscultation bilaterally, no wheezing, rhonchi or rales  Abd: soft, nontender, no hepatomegaly  Ext: no edema Musculoskeletal:  No deformities, BUE and BLE strength normal and equal Skin: warm and dry  Neuro:  CNs 2-12 intact, no focal abnormalities noted Psych:  Normal affect   EKG:  The EKG was personally reviewed and demonstrates:  Repeat EKG Telemetry:  Telemetry was personally reviewed and demonstrates:  NSR without significant ventricular ectopy  Relevant CV Studies:  Cath 11/06/2006 by Dr. Pura Spice and Dr. Lyndon Code at [facility]    Coronary Artery Disease      Left Main:  insignificant      LAD system:  significant      LCX system:  normal      RCA system:  insignificant      Number of vessels with significant CAD:  1 - Vessel    Right Heart Catheterization      Pulmonary Hypertension:  Mild    CORONARY ARTERIOGRAMS    Dominance: mixed    SA node origin: right    AV node origin: right    Branch                    Stenosis  Lesion Type  TIMI Flow    ------------------------  --------  -----------  ---------    Right Coronary Artery      RCA Ostium              30%       Discrete    Left Main      L  Ostium                40%       Discrete    Left Circumflex      Dist LCX  (Small)      OM1  (Large)      OM2  (Small)      OM3  (Small)    Left Anterior Descending      Dist LAD                60%       Discrete      D3  (Small)    Left Circumflex  normal    Echo 01/05/2019 INTERPRETATION ---------------------------------------------------------------    MILD LV DYSFUNCTION (See above)    NORMAL RIGHT VENTRICULAR SYSTOLIC FUNCTION    VALVULAR REGURGITATION: TRIVIAL AR, MILD TR    PROSTHETIC VALVE(S): MECH PROSTHETIC AoV, MECH PROSTHETIC MV    VERY POOR SOUND TRANSMISSION DESPITE THE USE OF IMAGING AGENT    NEAR NORMAL LV FUNCTION    RV ENLARGED BUT WITH NORMAL FUNCTION    Laboratory Data:  High Sensitivity Troponin:  No results for input(s): TROPONINIHS in the last 720 hours.   Chemistry Recent Labs  Lab 05/03/2021 2147 05/20/21 0836  NA 136 134*  K 4.0 4.3  CL 102 102  CO2 27 26  GLUCOSE 95 172*  BUN 22 19  CREATININE 1.04* 0.97  CALCIUM 8.3* 8.1*  GFRNONAA 58* >60  ANIONGAP 7 6    Recent Labs  Lab 04/28/2021 2147 05/20/21 0836  PROT 7.0 6.7  ALBUMIN 3.7 3.4*  AST 31 30  ALT 21 21  ALKPHOS 72 70  BILITOT 0.5 0.7   Lipids No results for input(s): CHOL, TRIG, HDL, LABVLDL, LDLCALC, CHOLHDL in the last 168 hours.  Hematology Recent Labs  Lab 05/16/2021 2147 05/20/21 0836  WBC 13.8* 21.1*  RBC 4.15 3.91  HGB 11.7* 11.1*  HCT 38.2 35.3*  MCV 92.0 90.3  MCH 28.2 28.4  MCHC 30.6 31.4  RDW 15.9* 15.9*  PLT 501* 455*   Thyroid No results for input(s): TSH, FREET4 in the last 168 hours.  BNPNo results for input(s): BNP, PROBNP in the last 168 hours.  DDimer No results for input(s): DDIMER in the last 168 hours.   Radiology/Studies:  CT ABDOMEN PELVIS WO CONTRAST  Result Date: 05/01/2021 CLINICAL DATA:  Recent fall on the right side with pain and known right femoral fracture, initial encounter EXAM: CT ABDOMEN AND PELVIS WITHOUT CONTRAST  TECHNIQUE: Multidetector CT imaging of the abdomen and pelvis was performed following the standard protocol without IV contrast. COMPARISON:  01/27/2013 FINDINGS: Lower chest: Mild scarring is noted in the lung bases. Breast implants are noted. Hepatobiliary: No focal liver abnormality is seen. No gallstones, gallbladder wall thickening, or biliary dilatation. Pancreas: Unremarkable. No pancreatic ductal dilatation or surrounding inflammatory changes. Spleen: Surgically removed Adrenals/Urinary Tract: Right adrenal gland is within normal limits. Stable left adrenal lesion is noted consistent with the an adenoma. This measures up to 3.7 cm. Right kidney is somewhat ptotic. No renal calculi or obstructive changes are noted. Ureters are within normal limits. Bladder is well distended. Stomach/Bowel: Appendix is within normal limits. Colon shows no obstructive or inflammatory changes. No small bowel abnormality is noted. Stomach is well distended. Vascular/Lymphatic: No significant vascular findings are present. No enlarged abdominal or pelvic lymph nodes. Reproductive: Uterus and bilateral adnexa are unremarkable. Other: Postsurgical changes are noted in the upper abdomen stable from the prior exam. No free fluid is noted. Musculoskeletal: Right subcapital femoral neck fracture is noted. Femoral head is well seated. Chronic sclerosis is noted within the sacrum bilaterally consistent with healed insufficiency fractures. Undisplaced right sacral fracture is noted best seen on the sagittal images number 59 of series 7. Old rib fractures are noted on the right. No acute bony abnormality is noted. IMPRESSION: Right subcapital femoral neck fractures similar to that seen on prior plain film. Undisplaced right sacral fracture is noted best seen on the sagittal images. Adjacent sclerotic healed insufficiency fractures are noted as well. Left adrenal mass is noted likely representing an adenoma stable from the prior  exam from  2014. Electronically Signed   By: Inez Catalina M.D.   On: 05/14/2021 23:23   DG Pelvis Portable  Result Date: 05/24/2021 CLINICAL DATA:  Recent trip and fall with pelvic pain, initial encounter EXAM: PORTABLE PELVIS 1 VIEWS COMPARISON:  None. FINDINGS: Right subcapital femoral neck fracture is noted with impaction at the fracture site. Pelvic ring is intact. No other focal abnormality is seen. IMPRESSION: Right subcapital femoral neck fracture. Electronically Signed   By: Inez Catalina M.D.   On: 05/20/2021 22:54   CT L-SPINE NO CHARGE  Result Date: 05/06/2021 CLINICAL DATA:  Fall EXAM: CT LUMBAR SPINE WITHOUT CONTRAST TECHNIQUE: Multidetector CT imaging of the lumbar spine was performed without intravenous contrast administration. Multiplanar CT image reconstructions were also generated. COMPARISON:  None. FINDINGS: Segmentation: 5 lumbar type vertebrae. Alignment: Grade 1 anterolisthesis at L4-5 Vertebrae: No acute fracture or focal pathologic process. Paraspinal and other soft tissues: Negative. Disc levels: No spinal canal or neural foraminal stenosis. IMPRESSION: Grade 1 anterolisthesis at L4-5 without spinal canal or neural foraminal stenosis. Electronically Signed   By: Ulyses Jarred M.D.   On: 05/04/2021 23:40   DG Chest Port 1 View  Result Date: 05/18/2021 CLINICAL DATA:  Recent fall with chest pain, initial encounter EXAM: PORTABLE CHEST 1 VIEW COMPARISON:  06/23/2018 FINDINGS: Cardiac shadow is mildly prominent. Postsurgical changes are again seen. Bilateral breast implants are noted causing generalized density over the chest bilaterally. Mild vascular congestion is seen. No focal infiltrate or effusion is noted. No acute bony abnormality is seen. Multiple old rib fractures are noted on the right. IMPRESSION: Mild vascular congestion. Generalized density over the chest is related to overlying breast implants. No other acute abnormality is noted. Electronically Signed   By: Inez Catalina M.D.    On: 05/16/2021 22:51   DG Femur Portable Min 2 Views Right  Result Date: 05/26/2021 CLINICAL DATA:  Recent fall with right leg pain, initial encounter EXAM: RIGHT FEMUR PORTABLE 2 VIEW COMPARISON:  None. FINDINGS: Subcapital right femoral neck fracture is noted with impaction and angulation at the fracture site. The more distal femur appears within normal limits. IMPRESSION: Right femoral neck fracture as described. Electronically Signed   By: Inez Catalina M.D.   On: 05/07/2021 22:54     Assessment and Plan:   Preoperative clearance  -Pending possible right hip surgery after mechanical fall -She has known history of CAD diagnosed in 2008, has not had any further ischemic work-up since then.  She requires the surgery to achieve the previous level functional independence. -Previous cardiac catheterization in 2008 revealed 60% distal LAD, 30% ostial RCA and 40% ostial left main disease.  Talking with the patient, she denies any recent exertional chest discomfort.   - She has chronic dyspnea on exertion which is unchanged in the recent years.  She attributed chronic dyspnea on exertion to COPD, I am unable to locate the previous documentation of COPD as diagnosis.  She has never smoked. -Obtain EKG. We Ruchy Wildrick discuss with MD, I would recommend a repeat echocardiogram to make sure her ejection fraction has not changed from the previous echo in 2020, if EF is stable, I think the patient should be able to proceed with surgery.  Right hip fracture: Due to mechanical fall.  Seen by orthopedic service  Mechanical aortic and mechanical mitral valve replacement in 2008: Previously followed by Grace [name] at [facility], her [name] is moving away to a different state.  She has been scheduled to  see a new [name] in March 2023.  -Coumadin on hold in anticipation of surgery.  Edilson Vital need heparin bridge once INR is below 2.5 given the mechanical mitral valve  CAD: Last cardiac catheterization revealed  single-vessel disease with 60% distal LAD, 40% left main and 30% ostial RCA.  Normal left circumflex artery.  She was managed medically.  She has not had any obvious anginal symptoms in the past several years.  PAF/flutter: On Coumadin, Coumadin on hold in anticipation of surgery.  Constrictive pericarditis s/p pericardiectomy  Hypertension  Hyperlipidemia  Hypothyroidism  Venous insufficiency  Hodgkin's disease in 1974 s/p Mantle field radiation and chemo  History of breast cancer   Risk Assessment/Risk Scores:          CHA2DS2-VASc Score = 6   This indicates a 9.7% annual risk of stroke. The patient's score is based upon: CHF History: 0 HTN History: 1 Diabetes History: 0 Stroke History: 2 Vascular Disease History: 1 Age Score: 1 Gender Score: 1         For questions or updates, please contact [place] Please consult [email or web address] for contact info under    Signed, Almyra Deforest, [place]  05/20/2021 10:08 AM  I have seen and examined this patient with Almyra Deforest.  Agree with above, note added to reflect my findings.  Of mechanical aortic valve replacements, constrictive pericarditis status post pericardiectomy, breast cancer, Hodgkin's lymphoma.  She was getting out of a chair and fell and had a right hip fracture and sacrum fracture.  She is planned for surgery.  Surgery is waiting for her INR to come down.  She does state that she is short of breath at baseline but this is unchanged over the last few years.  Prior to her fall, she has felt well without complaint.  She does have coronary artery disease found in 2008, but she has had no further episodes of chest pain.  GEN: Well nourished, well developed, in no acute distress  HEENT: normal  Neck: no JVD, carotid bruits, or masses Cardiac: RRR; no murmurs, rubs, or gallops,no edema  Respiratory:  clear to auscultation bilaterally, normal work of breathing GI: soft, nontender, nondistended, + BS MS: no deformity or  atrophy  Skin: warm and dry Neuro:  Strength and sensation are intact Psych: euthymic mood, full affect   Preoperative cardiac evaluation: Plan for surgery once INR has come down to acceptable level.  Cardiac catheterization in 2008 with moderate coronary artery disease.  She has had no exertional chest pain.  Agree with transthoracic echo to ensure that her valves are stable and ejection fraction is also stable.  If this remains stable, agree with surgery without further work-up.  Would be at intermediate to high risk for this intermediate risk procedure. Mechanical aortic and mitral valves: Replaced in 2008.  She is currently on Coumadin.  Awaiting for Coumadin to washout.  Once INR gets below 2.5, would start heparin for anticoagulation and thrombus prevention on her mechanical valves.  Heparin Dakhari Zuver need to be restarted after surgery as soon as possible per surgical recommendations.  Would also restart Coumadin after surgery.  Heparin Lonney Revak need to continue until INR is greater than 2.5. Coronary artery disease: Last catheterization with moderate coronary artery disease.  No anginal symptoms.  We Elisea Khader continue to monitor.  Shamecca Whitebread M. Zebedee Segundo MD 05/20/2021 10:34 AM

## 2021-05-20 NOTE — H&P (Signed)
History and Physical   Grace [name] YDX:[id number] DOB: Aug 04, 1950 DOA: 05/13/2021  Referring MD/NP/PA: Dr. Darl Householder  PCP: Marin Olp, MD   Patient coming from: Home  Chief Complaint: Fall with right hip pain  HPI: Grace [name] is a 70 y.o. female with medical history significant of aortic valve replacement with mechanical valves, atrial fibrillation, mild systolic dysfunction CHF, aortic insufficiency, chronic kidney disease stage III, history of breast cancer, history of mitral valve replacement, history of Hodgkin's disease, paroxysmal atrial flutter on chronic warfarin therapy, history of TIA among other things who presented to the ER after mechanical fall with displaced right femoral neck fracture.  Patient is complaining of 9 out of 10 pain with movement.  He was sent to have a mechanical fall.  Did not pass out and did not hit her head.  Patient evaluated in the ER.  Orthopedics consulted and Dr. Mable Fill plans surgical procedure.  Patient is on chronic anticoagulation with warfarin.  INR is currently 2.9.  Also on aspirin.  With her multiple medical problems recommendation is to be admitted to the medical service with orthopedic consultation and intervention..  ED Course: Temperature 98.3 blood pressure 137/86 pulse 100 respiratory 18 oxygen sat 91% room air.  White count 13.8 hemoglobin 11.7 platelets 501.  Chemistry showed a creatinine 1.04 calcium 8.3.  INR 2.9.  Respiratory panel still pending.  CT abdomen pelvis without contrast showed right subcapital femoral neck fracture as with nondisplaced right sacral fracture also on sagittal images.  Left adrenal mass which is unchanged from 2014.  X-ray of the right hip showed displaced right femoral neck fracture.  Chest x-ray showed mild vascular congestion otherwise no significant findings.  Patient will be admitted to the medical service for preop clearance.  Review of Systems: As per HPI otherwise 10 point review of systems  negative.    Past Medical History:  Diagnosis Date   Anemia    Aortic insufficiency    AORTIC VALVE REPLACEMENT, HX OF 04/27/2007   Qualifier: Diagnosis of  By: Leanne Chang MD, Bruce     Cancer [facility])    left breast- surgery and chemo   CHF (congestive heart failure) ([place])    Chronic kidney disease    Stage 3   Congestive heart failure ([place]) 08/03/2009   [facility] Dr. Paul Half. Last echo 2010 with EF 30%. Restrictive due to radiation. AVR MVR. 02/2014 visit planned.     Coronary artery disease    11/06/06 [facility]): 40% oLM, 60% dLAD, 30% oRCA   Dyspnea    with exertion   Dysrhythmia    paroxsymal a-flutter 2018; intermittent left BBB (2016)   Edema of both feet    GI bleed    Heart murmur    mitral and aortic valve replacement   Hodgkin's disease    ~1974, s/p [place] radiation and chemo   Hyperlipidemia    Hypertension    Hypothyroidism    Migraine    none since 2008   MITRAL VALVE REPLACEMENT, HX OF 04/27/2007   Qualifier: Diagnosis of  By: Leanne Chang MD, Bruce     Pericarditis    s/p pericardidal stripping through lateral thoracotomy 01/2007 [facility])   Stroke ([place]) 2008   No residual effects   TIA (transient ischemic attack)    Tricuspid regurgitation    Varicose veins of both lower extremities    left worse than right- have gotten larger in April 2020   WPW (Wolff-Parkinson-White syndrome)     Past Surgical History:  Procedure Laterality Date   AORTIC VALVE SURGERY     19 mm mechanical [facility] AVR 05/2006 [facility], Dr. Lajuana Matte)   bilateral breast implants     BIOPSY  02/22/2019   Procedure: BIOPSY;  Surgeon: Irving Copas., MD;  Location: [place];  Service: Gastroenterology;;   COLONOSCOPY WITH PROPOFOL N/A 02/22/2019   Procedure: COLONOSCOPY WITH PROPOFOL;  Surgeon: Irving Copas., MD;  Location: [place];  Service: Gastroenterology;  Laterality: N/A;   ENDOMETRIAL ABLATION     ENDOSCOPIC MUCOSAL RESECTION  01/10/2020   Procedure: ENDOSCOPIC MUCOSAL  RESECTION;  Surgeon: Rush Landmark Telford Nab., MD;  Location: [place];  Service: Gastroenterology;;   ENTEROSCOPY N/A 02/22/2019   Procedure: ENTEROSCOPY;  Surgeon: Rush Landmark Telford Nab., MD;  Location: [place];  Service: Gastroenterology;  Laterality: N/A;   ESOPHAGOGASTRODUODENOSCOPY (EGD) WITH PROPOFOL N/A 01/10/2020   Procedure: ESOPHAGOGASTRODUODENOSCOPY (EGD) WITH PROPOFOL;  Surgeon: Rush Landmark Telford Nab., MD;  Location: [place];  Service: Gastroenterology;  Laterality: N/A;   EXPLORATORY LAPAROTOMY     HEMOSTASIS CLIP PLACEMENT  02/22/2019   Procedure: HEMOSTASIS CLIP PLACEMENT;  Surgeon: Irving Copas., MD;  Location: [place];  Service: Gastroenterology;;   HEMOSTASIS CLIP PLACEMENT  01/10/2020   Procedure: HEMOSTASIS CLIP PLACEMENT;  Surgeon: Irving Copas., MD;  Location: [place];  Service: Gastroenterology;;   KYPHOPLASTY N/A 12/10/2018   Procedure: KYPHOPLASTY THORACIC SIX- THORACIC SEVEN;  Surgeon: Newman Pies, MD;  Location: [place];  Service: Neurosurgery;  Laterality: N/A;  KYPHOPLASTY THORACIC SIX- THORACIC SEVEN   MASTECTOMY     MITRAL VALVE REPLACEMENT     25 mm [employer] [facility] MVR, 05/2006, Dr. Lajuana Matte, [place]   pericardial stripping  9/08   POLYPECTOMY  02/22/2019   Procedure: POLYPECTOMY;  Surgeon: Mansouraty, Telford Nab., MD;  Location: [place];  Service: Gastroenterology;;   SPLENECTOMY     ~ [place] INJECTION  01/10/2020   Procedure: SUBMUCOSAL LIFTING INJECTION;  Surgeon: Irving Copas., MD;  Location: [place];  Service: Gastroenterology;;   SUBMUCOSAL TATTOO INJECTION  02/22/2019   Procedure: SUBMUCOSAL TATTOO INJECTION;  Surgeon: Irving Copas., MD;  Location: [place];  Service: Gastroenterology;;   THORACOTOMY     for pericardial stripping 01/2007 (Dr. Elenor Quinones, [facility])   [place]     valvulopathy  06/13/06     reports that she has never smoked.  She has never used smokeless tobacco. She reports that she does not drink alcohol and does not use drugs.  Allergies  Allergen Reactions   Digoxin Other (See Comments)    bradycardia   Statins     myalgia    Family History  Problem Relation Age of Onset   Heart disease Mother    Cancer Mother        breast   Other Father        hunting gun accident   Heart disease Maternal Aunt    Cancer Maternal Aunt        uterine   Heart disease Maternal Grandmother    Diabetes Maternal Grandmother    Hypercalcemia Neg Hx    Colon cancer Neg Hx    Esophageal cancer Neg Hx    Liver disease Neg Hx    Pancreatic cancer Neg Hx    Rectal cancer Neg Hx    Stomach cancer Neg Hx      Prior to Admission medications   Medication Sig Start Date End Date Taking? Authorizing Provider  amoxicillin (AMOXIL) 500 MG capsule Take 2,000 mg by mouth as directed. Take 2000 mg 1 hour prior to dental procedures   Yes [provider]  aspirin 81 MG tablet Take 81 mg by mouth daily.   Yes [provider]  aspirin-acetaminophen-caffeine (EXCEDRIN MIGRAINE) 929 [id number] MG tablet Take 2 tablets by mouth every 6 (six) hours as needed for headache.   Yes [provider]  cholecalciferol (VITAMIN D3) 25 MCG (1000 UT) tablet Take 1,000 Units by mouth daily.   Yes [provider]  denosumab (PROLIA) 60 MG/ML SOSY injection Inject 60 mg into the skin every 6 (six) months.  04/01/19  Yes [provider]  diphenhydramine-acetaminophen (TYLENOL PM) 25-500 MG TABS tablet Take 2 tablets by mouth at bedtime as needed (sleep).    Yes [provider]  docusate sodium (COLACE) 100 MG capsule Take 100 mg by mouth every evening.   Yes [provider]  ezetimibe (ZETIA) 10 MG tablet Take 10 mg by mouth every evening.  12/30/17  Yes [provider]  furosemide (LASIX) 40 MG tablet TAKE 1 TABLET EVERY DAY Patient taking differently: Take 40 mg by mouth daily. 05/11/21   Yes Marin Olp, MD  levothyroxine (SYNTHROID) 100 MCG tablet TAKE 1 TABLET EVERY DAY Patient taking differently: Take 100 mcg by mouth daily before breakfast. 02/08/21  Yes Marin Olp, MD  Magnesium 250 MG TABS Take 250 mg by mouth daily with lunch.   Yes [provider]  metoprolol (TOPROL-XL) 50 MG 24 hr tablet Take 50 mg by mouth 2 (two) times daily.   Yes [provider]  omeprazole (PRILOSEC) 20 MG capsule TAKE 1 CAPSULE (20 MG TOTAL) BY MOUTH DAILY 30 MINUTES BEFORE BREAKFAST OR DINNER. Patient taking differently: Take 20 mg by mouth daily. 05/14/21  Yes Mansouraty, Telford Nab., MD  potassium chloride SA (KLOR-CON) 20 MEQ tablet TAKE 1 TABLET EVERY DAY Patient taking differently: Take 20 mEq by mouth daily. 04/23/21  Yes Marin Olp, MD  REPATHA SURECLICK 725 MG/ML SOAJ Inject 140 mg into the skin every 14 (fourteen) days. 09/14/19  Yes [provider]  spironolactone (ALDACTONE) 25 MG tablet Take 50 mg by mouth daily.   Yes [provider]  traMADol (ULTRAM) 50 MG tablet Take 50-100 mg by mouth every 6 (six) hours as needed. 05/15/21  Yes [provider]  warfarin (COUMADIN) 5 MG tablet TAKE 1 TABLET EVERY DAY EXCEPT TAKE 1/2 TABLET ON FRIDAYS AS DIRECTED Patient taking differently: Take 5 mg by mouth See admin instructions. 2.5 mg Monday,Wednesday,Friday 5 mg Tuesday,Thursday,Saturday and sunday 02/08/21  Yes Marin Olp, MD    Physical Exam: Vitals:   04/28/2021 2142 05/13/2021 2142 05/02/2021 2143 05/24/2021 2313  BP:  (!) 152/92  136/86  Pulse: 96 95  100  Resp:  16  18  Temp:   98.1 F (36.7 C) 98.3 F (36.8 C)  TempSrc:   Oral Oral  SpO2: 91% 93% 96% 100%      Constitutional: Acutely ill looking no distress Vitals:   05/15/2021 2142 04/26/2021 2142 05/11/2021 2143 05/15/2021 2313  BP:  (!) 152/92  136/86  Pulse: 96 95  100  Resp:  16  18  Temp:   98.1 F (36.7 C) 98.3 F (36.8 C)  TempSrc:   Oral Oral  SpO2:  91% 93% 96% 100%   Eyes: PERRL, lids and conjunctivae normal ENMT: Mucous membranes are moist. Posterior pharynx clear of any exudate or lesions.Normal dentition.  Neck: normal, supple, no masses, no thyromegaly Respiratory: clear to auscultation bilaterally, no wheezing, no crackles. Normal respiratory effort. No accessory muscle use.  Cardiovascular: Regular rate and rhythm, large grade 4 out of 5 systolic murmur with loud click, no extremity edema. 2+ pedal pulses. No carotid bruits.  Abdomen: no tenderness, no masses palpated. No hepatosplenomegaly. Bowel sounds positive.  Musculoskeletal: no clubbing / cyanosis.  Laterally rotated right lower extremity with ecchymosis on the side of the hip, no contractures. Normal muscle tone.  Skin: no rashes, lesions, ulcers. No induration Neurologic: CN 2-12 grossly intact. Sensation intact, DTR normal. Strength 5/5 in all 4.  Psychiatric: Normal judgment and insight. Alert and oriented x 3. Normal mood.     Labs on Admission: I have personally reviewed following labs and imaging studies  CBC: Recent Labs  Lab 05/14/2021 2147  WBC 13.8*  NEUTROABS 10.9*  HGB 11.7*  HCT 38.2  MCV 92.0  PLT 683*   Basic Metabolic Panel: Recent Labs  Lab 05/01/2021 2147  NA 136  K 4.0  CL 102  CO2 27  GLUCOSE 95  BUN 22  CREATININE 1.04*  CALCIUM 8.3*   GFR: CrCl cannot be calculated (Unknown ideal weight.). Liver Function Tests: Recent Labs  Lab 05/25/2021 2147  AST 31  ALT 21  ALKPHOS 72  BILITOT 0.5  PROT 7.0  ALBUMIN 3.7   No results for input(s): LIPASE, AMYLASE in the last 168 hours. No results for input(s): AMMONIA in the last 168 hours. Coagulation Profile: Recent Labs  Lab 05/16/2021 2147  INR 2.9*   Cardiac Enzymes: No results for input(s): CKTOTAL, CKMB, CKMBINDEX, TROPONINI in the last 168 hours. BNP (last 3 results) No results for input(s): PROBNP in the last 8760 hours. HbA1C: No results for input(s): HGBA1C in the last  72 hours. CBG: No results for input(s): GLUCAP in the last 168 hours. Lipid Profile: No results for input(s): CHOL, HDL, LDLCALC, TRIG, CHOLHDL, LDLDIRECT in the last 72 hours. Thyroid Function Tests: No results for input(s): TSH, T4TOTAL, FREET4, T3FREE, THYROIDAB in the last 72 hours. Anemia Panel: No results for input(s): VITAMINB12, FOLATE, FERRITIN, TIBC, IRON, RETICCTPCT in the last 72 hours. Urine analysis:    Component Value Date/Time   COLORURINE YELLOW 03/10/2007 0425   APPEARANCEUR CLEAR 03/10/2007 0425   LABSPEC 1.008 03/10/2007 0425   PHURINE 6.5 03/10/2007 0425   GLUCOSEU NEGATIVE 03/10/2007 0425   HGBUR NEGATIVE 03/10/2007 0425   BILIRUBINUR n 04/09/2016 1211   KETONESUR NEGATIVE 03/10/2007 0425   PROTEINUR n 04/09/2016 1211   PROTEINUR NEGATIVE 03/10/2007 0425   UROBILINOGEN 0.2 04/09/2016 1211   UROBILINOGEN 0.2 03/10/2007 0425   NITRITE n 04/09/2016 1211   NITRITE NEGATIVE 03/10/2007 0425   LEUKOCYTESUR Negative 04/09/2016 1211   Sepsis Labs: @LABRCNTIP (procalcitonin:4,lacticidven:4) )No results found for this or any previous visit (from the past 240 hour(s)).   Radiological Exams on Admission: CT ABDOMEN PELVIS WO CONTRAST  Result Date: 05/18/2021 CLINICAL DATA:  Recent fall on the right side with pain and known right femoral fracture, initial encounter EXAM: CT ABDOMEN AND PELVIS WITHOUT CONTRAST TECHNIQUE: Multidetector CT imaging of the abdomen and pelvis was performed following the standard protocol without IV contrast. COMPARISON:  01/27/2013 FINDINGS: Lower chest: Mild scarring is noted in the lung bases. Breast implants are noted. Hepatobiliary: No focal liver abnormality is seen. No gallstones, gallbladder wall thickening, or biliary dilatation. Pancreas: Unremarkable. No pancreatic ductal dilatation or surrounding inflammatory changes. Spleen: Surgically removed Adrenals/Urinary Tract: Right adrenal gland  is within normal limits. Stable left adrenal  lesion is noted consistent with the an adenoma. This measures up to 3.7 cm. Right kidney is somewhat ptotic. No renal calculi or obstructive changes are noted. Ureters are within normal limits. Bladder is well distended. Stomach/Bowel: Appendix is within normal limits. Colon shows no obstructive or inflammatory changes. No small bowel abnormality is noted. Stomach is well distended. Vascular/Lymphatic: No significant vascular findings are present. No enlarged abdominal or pelvic lymph nodes. Reproductive: Uterus and bilateral adnexa are unremarkable. Other: Postsurgical changes are noted in the upper abdomen stable from the prior exam. No free fluid is noted. Musculoskeletal: Right subcapital femoral neck fracture is noted. Femoral head is well seated. Chronic sclerosis is noted within the sacrum bilaterally consistent with healed insufficiency fractures. Undisplaced right sacral fracture is noted best seen on the sagittal images number 59 of series 7. Old rib fractures are noted on the right. No acute bony abnormality is noted. IMPRESSION: Right subcapital femoral neck fractures similar to that seen on prior plain film. Undisplaced right sacral fracture is noted best seen on the sagittal images. Adjacent sclerotic healed insufficiency fractures are noted as well. Left adrenal mass is noted likely representing an adenoma stable from the prior exam from 2014. Electronically Signed   By: Inez Catalina M.D.   On: 05/10/2021 23:23   DG Pelvis Portable  Result Date: 04/27/2021 CLINICAL DATA:  Recent trip and fall with pelvic pain, initial encounter EXAM: PORTABLE PELVIS 1 VIEWS COMPARISON:  None. FINDINGS: Right subcapital femoral neck fracture is noted with impaction at the fracture site. Pelvic ring is intact. No other focal abnormality is seen. IMPRESSION: Right subcapital femoral neck fracture. Electronically Signed   By: Inez Catalina M.D.   On: 05/18/2021 22:54   CT L-SPINE NO CHARGE  Result Date:  05/03/2021 CLINICAL DATA:  Fall EXAM: CT LUMBAR SPINE WITHOUT CONTRAST TECHNIQUE: Multidetector CT imaging of the lumbar spine was performed without intravenous contrast administration. Multiplanar CT image reconstructions were also generated. COMPARISON:  None. FINDINGS: Segmentation: 5 lumbar type vertebrae. Alignment: Grade 1 anterolisthesis at L4-5 Vertebrae: No acute fracture or focal pathologic process. Paraspinal and other soft tissues: Negative. Disc levels: No spinal canal or neural foraminal stenosis. IMPRESSION: Grade 1 anterolisthesis at L4-5 without spinal canal or neural foraminal stenosis. Electronically Signed   By: Ulyses Jarred M.D.   On: 04/29/2021 23:40   DG Chest Port 1 View  Result Date: 05/18/2021 CLINICAL DATA:  Recent fall with chest pain, initial encounter EXAM: PORTABLE CHEST 1 VIEW COMPARISON:  06/23/2018 FINDINGS: Cardiac shadow is mildly prominent. Postsurgical changes are again seen. Bilateral breast implants are noted causing generalized density over the chest bilaterally. Mild vascular congestion is seen. No focal infiltrate or effusion is noted. No acute bony abnormality is seen. Multiple old rib fractures are noted on the right. IMPRESSION: Mild vascular congestion. Generalized density over the chest is related to overlying breast implants. No other acute abnormality is noted. Electronically Signed   By: Inez Catalina M.D.   On: 05/18/2021 22:51   DG Femur Portable Min 2 Views Right  Result Date: 05/06/2021 CLINICAL DATA:  Recent fall with right leg pain, initial encounter EXAM: RIGHT FEMUR PORTABLE 2 VIEW COMPARISON:  None. FINDINGS: Subcapital right femoral neck fracture is noted with impaction and angulation at the fracture site. The more distal femur appears within normal limits. IMPRESSION: Right femoral neck fracture as described. Electronically Signed   By: Inez Catalina M.D.   On: 05/26/2021  22:54      Assessment/Plan Principal Problem:   Closed right femoral  fracture (HCC) Active Problems:   Hypothyroidism   Hyperlipidemia   Congestive heart failure (HCC)   History of cardiovascular disorder   CKD (chronic kidney disease), stage III (HCC)   Atrial flutter (HCC)   Hx of long-term (current) use of anticoagulants   CAD (coronary artery disease)     #1 displaced right femoral neck fracture: Patient has closed subcapital fracture.  Orthopedics already aware and will evaluate patient.  She is on warfarin which we will hold.  INR is 2.9.  Goal is to get INR down to close to 1.5.  Hold warfarin for now.  Await INR to drop.  If it drops below 2.0 will initiate Lovenox.  Not sure when surgery will be planned but will have daily INR.  #2 history of mechanical valve replacement: Patient will need continued anticoagulation.  Again if INR drops to 2.0 or less we will initiate Lovenox bridge.  #3 hypothyroidism: Continue levothyroxine  #4 mild systolic CHF: Patient actually appears dry.  I will give her a little bit of LR.  #5 chronic kidney disease stage III: Appears to be at baseline.  #6 history of A. fib with a flutter: Appears close to baseline.  Continue monitor.  Hold warfarin.  Continue other rate control medications.  #7 coronary artery disease: No acute decompensation.   DVT prophylaxis: Warfarin which is on hold Code Status: Full code Family Communication: Husband at bedside Disposition Plan: To be determined Consults called: Dr. Mable Fill, orthopedic surgery Admission status: Inpatient  Severity of Illness: The appropriate patient status for this patient is INPATIENT. Inpatient status is judged to be reasonable and necessary in order to provide the required intensity of service to ensure the patient's safety. The patient's presenting symptoms, physical exam findings, and initial radiographic and laboratory data in the context of their chronic comorbidities is felt to place them at high risk for further clinical deterioration. Furthermore,  it is not anticipated that the patient will be medically stable for discharge from the hospital within 2 midnights of admission.   * I certify that at the point of admission it is my clinical judgment that the patient will require inpatient hospital care spanning beyond 2 midnights from the point of admission due to high intensity of service, high risk for further deterioration and high frequency of surveillance required.Barbette Merino MD [facility] Pager [phone]  If 7PM-7AM, please contact night-coverage [email or web address] Password [facility]  05/20/2021, 12:16 AM

## 2021-05-20 NOTE — Progress Notes (Signed)
ANTICOAGULATION CONSULT NOTE - Initial Consult  Pharmacy Consult for Heparin Bridge (on Warfarin PTA) Indication: atrial fibrillation and mechanical mitral and aortic valve  Allergies  Allergen Reactions   Digoxin Other (See Comments)    bradycardia   Statins     myalgia    Patient Measurements: Height: 5' 3.5" (161.3 cm) Weight: 62.7 kg (138 lb 3.7 oz) IBW/kg (Calculated) : 53.55 Heparin Dosing Weight: 62.7 kg  Vital Signs: Temp: 97.4 F (36.3 C) (12/25 0555) Temp Source: Oral (12/25 0555) BP: 124/71 (12/25 0555) Pulse Rate: 97 (12/25 0555)  Labs: Recent Labs    05/04/2021 2147 05/20/21 0836  HGB 11.7* 11.1*  HCT 38.2 35.3*  PLT 501* 455*  LABPROT 29.9*  --   INR 2.9*  --   CREATININE 1.04* 0.97    Estimated Creatinine Clearance: 45.7 mL/min (by C-G formula based on SCr of 0.97 mg/dL).   Medical History: Past Medical History:  Diagnosis Date   Anemia    Aortic insufficiency    AORTIC VALVE REPLACEMENT, HX OF 04/27/2007   Qualifier: Diagnosis of  By: Leanne Chang MD, Bruce     Cancer [facility])    left breast- surgery and chemo   CHF (congestive heart failure) ([place])    Chronic kidney disease    Stage 3   Congestive heart failure ([place]) 08/03/2009   [facility] Dr. Paul Half. Last echo 2010 with EF 30%. Restrictive due to radiation. AVR MVR. 02/2014 visit planned.     Coronary artery disease    11/06/06 [facility]): 40% oLM, 60% dLAD, 30% oRCA   Dyspnea    with exertion   Dysrhythmia    paroxsymal a-flutter 2018; intermittent left BBB (2016)   Edema of both feet    GI bleed    Heart murmur    mitral and aortic valve replacement   Hodgkin's disease    ~1974, s/p Mantle field radiation and chemo   Hyperlipidemia    Hypertension    Hypothyroidism    Migraine    none since 2008   MITRAL VALVE REPLACEMENT, HX OF 04/27/2007   Qualifier: Diagnosis of  By: Leanne Chang MD, Bruce     Pericarditis    s/p pericardidal stripping through lateral thoracotomy 01/2007 [facility])   Stroke ([place]) 2008    No residual effects   TIA (transient ischemic attack)    Tricuspid regurgitation    Varicose veins of both lower extremities    left worse than right- have gotten larger in April 2020   WPW (Wolff-Parkinson-White syndrome)     Medications:  Scheduled:   cholecalciferol  1,000 Units Oral Daily   docusate sodium  100 mg Oral QPM   ezetimibe  10 mg Oral QPM   levothyroxine  100 mcg Oral Q0600   metoprolol succinate  50 mg Oral BID   pantoprazole  40 mg Oral Daily    Assessment: 70 yo F presenting with right hip fracture after mechanical fall, pending possible right hip surgery. Patient on warfarin PTA (last dose 12/24) for atrial fibrillation and mechanical aortic and mechanical mitral valve replacement. Warfarin on hold in anticipation of surgery. Per cardiology, will need IV heparin bridge once INR is below 2.5 given mechanical mitral valve. Pharmacy consulted for heparin dosing.   INR today is 3.1, CBC stable. No signs/symptoms of bleeding noted.   Goal of Therapy:  INR 2.5-3.5 Monitor platelets by anticoagulation protocol: Yes   Plan:  Hold warfarin, follow-up heparin bridge once INR is below 2.5. Daily INR, CBC. Monitor for signs/symptoms  of bleeding.    Vance Peper, PharmD PGY1 Pharmacy Resident Phone [phone] 05/20/2021 11:39 AM   Please check AMION for all [place] phone numbers After 10:00 PM, call [place] [phone]

## 2021-05-20 NOTE — Progress Notes (Signed)
*  PRELIMINARY RESULTS* Echocardiogram 2D Echocardiogram has been performed.  Samuel Germany 05/20/2021, 3:39 PM

## 2021-05-21 DIAGNOSIS — I5032 Chronic diastolic (congestive) heart failure: Secondary | ICD-10-CM

## 2021-05-21 DIAGNOSIS — Z9229 Personal history of other drug therapy: Secondary | ICD-10-CM

## 2021-05-21 DIAGNOSIS — S72001A Fracture of unspecified part of neck of right femur, initial encounter for closed fracture: Secondary | ICD-10-CM | POA: Diagnosis not present

## 2021-05-21 DIAGNOSIS — Z952 Presence of prosthetic heart valve: Secondary | ICD-10-CM | POA: Diagnosis not present

## 2021-05-21 DIAGNOSIS — Z01810 Encounter for preprocedural cardiovascular examination: Secondary | ICD-10-CM | POA: Diagnosis not present

## 2021-05-21 LAB — BASIC METABOLIC PANEL
Anion gap: 11 (ref 5–15)
BUN: 16 mg/dL (ref 8–23)
CO2: 21 mmol/L — ABNORMAL LOW (ref 22–32)
Calcium: 7.7 mg/dL — ABNORMAL LOW (ref 8.9–10.3)
Chloride: 100 mmol/L (ref 98–111)
Creatinine, Ser: 0.94 mg/dL (ref 0.44–1.00)
GFR, Estimated: >60 mL/min (ref >60)
Glucose, Bld: 110 mg/dL — ABNORMAL HIGH (ref 70–99)
Potassium: 4.6 mmol/L (ref 3.5–5.1)
Sodium: 132 mmol/L — ABNORMAL LOW (ref 135–145)

## 2021-05-21 LAB — PROTIME-INR
INR: 2.4 — ABNORMAL HIGH (ref 0.8–1.2)
Prothrombin Time: 25.9 seconds — ABNORMAL HIGH (ref 11.4–15.2)

## 2021-05-21 LAB — CBC
HCT: 33.8 % — ABNORMAL LOW (ref 36.0–46.0)
Hemoglobin: 10.7 g/dL — ABNORMAL LOW (ref 12.0–15.0)
MCH: 28.5 pg (ref 26.0–34.0)
MCHC: 31.7 g/dL (ref 30.0–36.0)
MCV: 90.1 fL (ref 80.0–100.0)
Platelets: 400 10*3/uL (ref 150–400)
RBC: 3.75 MIL/uL — ABNORMAL LOW (ref 3.87–5.11)
RDW: 15.9 % — ABNORMAL HIGH (ref 11.5–15.5)
WBC: 17.7 10*3/uL — ABNORMAL HIGH (ref 4.0–10.5)
nRBC: 0 % (ref 0.0–0.2)

## 2021-05-21 LAB — MAGNESIUM: Magnesium: 2.1 mg/dL (ref 1.7–2.4)

## 2021-05-21 LAB — HEPARIN LEVEL (UNFRACTIONATED): Heparin Unfractionated: <0.1 IU/mL — ABNORMAL LOW (ref 0.30–0.70)

## 2021-05-21 MED ORDER — HEPARIN (PORCINE) 25000 UT/250ML-% IV SOLN
1250.0000 [IU]/h | INTRAVENOUS | Status: DC
  Administered 2021-05-21: 08:00:00 800 [IU]/h via INTRAVENOUS
  Filled 2021-05-21: qty 250

## 2021-05-21 MED ORDER — FUROSEMIDE 10 MG/ML IJ SOLN
20.0000 mg | Freq: Once | INTRAMUSCULAR | Status: AC
  Administered 2021-05-21: 11:00:00 20 mg via INTRAVENOUS
  Filled 2021-05-21: qty 2

## 2021-05-21 NOTE — Progress Notes (Signed)
Orthopaedics Daily Progress Note   05/21/2021   8:47 AM  Grace [name] is a 70 y.o. female   with right displaced femoral neck fracture.  Subjective Pain appropriately controled.  Denies nausea, vomiting, or fevers.   Objective Vitals:   05/21/21 0757 05/21/21 0759  BP: 128/68   Pulse: (!) 108 94  Resp: 19   Temp: 98.2 F (36.8 C)   SpO2: 95%    No intake or output data in the 24 hours ending 05/21/21 0847  Physical Exam RLE: +DF/PF/EHL SILT SP/DP/T +DP/PT and WWP distally  Assessment 70 y.o. female with right displaced femoral neck fracture. INR still elevated at 2.4.   Plan Mobility: Bed rest pending OR Pain control: Continue to wean/titrate to appropriate oral regimen DVT Prophylaxis: Holding in anticipation of OR while INR super-therapeutic Further surgical plans: OR pending INR normalization and OR availability RUE: No restrictions LUE: No restrictions RLE: NWB LLE: No restrictions Disposition: Pending OR, likely tomorrow; [facility] depending on OR/surgeon availability   Georgeanna Harrison M.D. Orthopaedic Surgery [facility]

## 2021-05-21 NOTE — Progress Notes (Signed)
[place] for Heparin Bridge (on Warfarin PTA) Indication: atrial fibrillation and mechanical mitral and aortic valve  Allergies  Allergen Reactions   Digoxin Other (See Comments)    bradycardia   Statins     myalgia    Patient Measurements: Height: 5' 3.5" (161.3 cm) Weight: 62.7 kg (138 lb 3.7 oz) IBW/kg (Calculated) : 53.55 Heparin Dosing Weight: 62.7 kg  Vital Signs: Temp: 98.1 F (36.7 C) (12/26 0346) Temp Source: Oral (12/26 0346) BP: 119/62 (12/26 0346) Pulse Rate: 94 (12/26 0346)  Labs: Recent Labs    05/21/2021 2147 05/20/21 0836 05/20/21 1107 05/21/21 0432  HGB 11.7* 11.1*  --  10.7*  HCT 38.2 35.3*  --  33.8*  PLT 501* 455*  --  400  LABPROT 29.9*  --  31.9* 25.9*  INR 2.9*  --  3.1* 2.4*  CREATININE 1.04* 0.97  --  0.94     Estimated Creatinine Clearance: 47.1 mL/min (by C-G formula based on SCr of 0.94 mg/dL).   Medical History: Past Medical History:  Diagnosis Date   Anemia    Aortic insufficiency    AORTIC VALVE REPLACEMENT, HX OF 04/27/2007   Qualifier: Diagnosis of  By: Leanne Chang MD, Bruce     Cancer [facility])    left breast- surgery and chemo   CHF (congestive heart failure) ([place])    Chronic kidney disease    Stage 3   Congestive heart failure ([place]) 08/03/2009   [facility] Dr. Paul Half. Last echo 2010 with EF 30%. Restrictive due to radiation. AVR MVR. 02/2014 visit planned.     Coronary artery disease    11/06/06 [facility]): 40% oLM, 60% dLAD, 30% oRCA   Dyspnea    with exertion   Dysrhythmia    paroxsymal a-flutter 2018; intermittent left BBB (2016)   Edema of both feet    GI bleed    Heart murmur    mitral and aortic valve replacement   Hodgkin's disease    ~1974, s/p Mantle field radiation and chemo   Hyperlipidemia    Hypertension    Hypothyroidism    Migraine    none since 2008   MITRAL VALVE REPLACEMENT, HX OF 04/27/2007   Qualifier: Diagnosis of  By: Leanne Chang MD, Bruce     Pericarditis    s/p  pericardidal stripping through lateral thoracotomy 01/2007 [facility])   Stroke ([place]) 2008   No residual effects   TIA (transient ischemic attack)    Tricuspid regurgitation    Varicose veins of both lower extremities    left worse than right- have gotten larger in April 2020   WPW (Wolff-Parkinson-White syndrome)     Medications:  Scheduled:   cholecalciferol  1,000 Units Oral Daily   docusate sodium  100 mg Oral QPM   ezetimibe  10 mg Oral QPM   levothyroxine  100 mcg Oral Q0600   metoprolol succinate  50 mg Oral BID   pantoprazole  40 mg Oral Daily    Assessment: 70 yo F presenting with right hip fracture after mechanical fall, pending possible right hip surgery. Patient on warfarin PTA (last dose 12/24) for atrial fibrillation and mechanical aortic and mechanical mitral valve replacement. Warfarin on hold in anticipation of surgery. Pharmacy asked to begin IV heparin as a bridge once INR <2.5.  INR this am is 2.4, PO vitamin K 10mg  was given 12/25. CBC stable.  Goal of Therapy:  Heparin level 0.3-0.7 units/ml Monitor platelets by anticoagulation protocol: Yes   Plan:  Continue to hold warfarin Begin IV heparin 800 units/h Check heparin level in 8h  Arrie Senate, PharmD, [place], [facility] Clinical Pharmacist [phone] Please check AMION for all [place] numbers 05/21/2021

## 2021-05-21 NOTE — Progress Notes (Addendum)
Progress Note  Patient Name: Grace [name] Date of Encounter: 05/21/2021  [facility] Cardiologist: [facility] (Dr. Alycia Rossetti -> Dr. Leonides Schanz)  Subjective   No acute events overnight. Has had dry mouth and sleepiness, very sensitive to pain medication. Pain well controlled currently. Awaiting coumadin washout.   Inpatient Medications    Scheduled Meds:  cholecalciferol  1,000 Units Oral Daily   docusate sodium  100 mg Oral QPM   ezetimibe  10 mg Oral QPM   levothyroxine  100 mcg Oral Q0600   metoprolol succinate  50 mg Oral BID   pantoprazole  40 mg Oral Daily   Continuous Infusions:  heparin 800 Units/hr (05/21/21 0804)   PRN Meds: acetaminophen, dextromethorphan-guaiFENesin, hydrALAZINE, ipratropium-albuterol, metoCLOPramide (REGLAN) injection, metoprolol tartrate, morphine injection, ondansetron **OR** ondansetron (ZOFRAN) IV, oxyCODONE, senna-docusate, traZODone   Vital Signs    Vitals:   05/20/21 2356 05/21/21 0346 05/21/21 0757 05/21/21 0759  BP: 117/65 119/62 128/68   Pulse: 92 94 (!) 108 94  Resp: 17 (!) 21 19   Temp: 98.2 F (36.8 C) 98.1 F (36.7 C) 98.2 F (36.8 C)   TempSrc: Oral Oral Oral   SpO2: 95% 96% 95%   Weight:      Height:       No intake or output data in the 24 hours ending 05/21/21 1010 Last 3 Weights 05/20/2021 04/30/2021 04/04/2021  Weight (lbs) 138 lb 3.7 oz 132 lb 8 oz 130 lb 9.6 oz  Weight (kg) 62.7 kg 60.102 kg 59.24 kg      Telemetry    Regular rate predominantly rates in the 90s, very steady, suspect atypical flutter vs sinus rhythm- Personally Reviewed  ECG    Ordered, not presently in system - Personally Reviewed  Physical Exam   GEN: Well nourished, well developed in no acute distress NECK: JVD to jaw at 60 degrees CARDIAC: regular rhythm, normal S1 and S2, no rubs or gallops. No murmur. Crisp mechanical valve closures VASCULAR: Radial pulses 2+ bilaterally.  RESPIRATORY:  Expiratory wheezing bilaterally ABDOMEN: Soft,  non-tender, non-distended MUSCULOSKELETAL:  Moves all 4 limbs independently SKIN: Warm and dry, no edema NEUROLOGIC:  No focal neuro deficits noted. PSYCHIATRIC:  Normal affect    Labs    High Sensitivity Troponin:  No results for input(s): TROPONINIHS in the last 720 hours.   Chemistry Recent Labs  Lab 04/27/2021 2147 05/20/21 0836 05/21/21 0432  NA 136 134* 132*  K 4.0 4.3 4.6  CL 102 102 100  CO2 27 26 21*  GLUCOSE 95 172* 110*  BUN 22 19 16   CREATININE 1.04* 0.97 0.94  CALCIUM 8.3* 8.1* 7.7*  MG  --   --  2.1  PROT 7.0 6.7  --   ALBUMIN 3.7 3.4*  --   AST 31 30  --   ALT 21 21  --   ALKPHOS 72 70  --   BILITOT 0.5 0.7  --   GFRNONAA 58* >60 >60  ANIONGAP 7 6 11     Lipids No results for input(s): CHOL, TRIG, HDL, LABVLDL, LDLCALC, CHOLHDL in the last 168 hours.  Hematology Recent Labs  Lab 05/02/2021 2147 05/20/21 0836 05/21/21 0432  WBC 13.8* 21.1* 17.7*  RBC 4.15 3.91 3.75*  HGB 11.7* 11.1* 10.7*  HCT 38.2 35.3* 33.8*  MCV 92.0 90.3 90.1  MCH 28.2 28.4 28.5  MCHC 30.6 31.4 31.7  RDW 15.9* 15.9* 15.9*  PLT 501* 455* 400   Thyroid No results for input(s): TSH, FREET4 in the  last 168 hours.  BNPNo results for input(s): BNP, PROBNP in the last 168 hours.  DDimer No results for input(s): DDIMER in the last 168 hours.   Radiology    CT ABDOMEN PELVIS WO CONTRAST  Result Date: 05/16/2021 CLINICAL DATA:  Recent fall on the right side with pain and known right femoral fracture, initial encounter EXAM: CT ABDOMEN AND PELVIS WITHOUT CONTRAST TECHNIQUE: Multidetector CT imaging of the abdomen and pelvis was performed following the standard protocol without IV contrast. COMPARISON:  01/27/2013 FINDINGS: Lower chest: Mild scarring is noted in the lung bases. Breast implants are noted. Hepatobiliary: No focal liver abnormality is seen. No gallstones, gallbladder wall thickening, or biliary dilatation. Pancreas: Unremarkable. No pancreatic ductal dilatation or surrounding  inflammatory changes. Spleen: Surgically removed Adrenals/Urinary Tract: Right adrenal gland is within normal limits. Stable left adrenal lesion is noted consistent with the an adenoma. This measures up to 3.7 cm. Right kidney is somewhat ptotic. No renal calculi or obstructive changes are noted. Ureters are within normal limits. Bladder is well distended. Stomach/Bowel: Appendix is within normal limits. Colon shows no obstructive or inflammatory changes. No small bowel abnormality is noted. Stomach is well distended. Vascular/Lymphatic: No significant vascular findings are present. No enlarged abdominal or pelvic lymph nodes. Reproductive: Uterus and bilateral adnexa are unremarkable. Other: Postsurgical changes are noted in the upper abdomen stable from the prior exam. No free fluid is noted. Musculoskeletal: Right subcapital femoral neck fracture is noted. Femoral head is well seated. Chronic sclerosis is noted within the sacrum bilaterally consistent with healed insufficiency fractures. Undisplaced right sacral fracture is noted best seen on the sagittal images number 59 of series 7. Old rib fractures are noted on the right. No acute bony abnormality is noted. IMPRESSION: Right subcapital femoral neck fractures similar to that seen on prior plain film. Undisplaced right sacral fracture is noted best seen on the sagittal images. Adjacent sclerotic healed insufficiency fractures are noted as well. Left adrenal mass is noted likely representing an adenoma stable from the prior exam from 2014. Electronically Signed   By: Inez Catalina M.D.   On: 05/20/2021 23:23   DG Pelvis Portable  Result Date: 04/30/2021 CLINICAL DATA:  Recent trip and fall with pelvic pain, initial encounter EXAM: PORTABLE PELVIS 1 VIEWS COMPARISON:  None. FINDINGS: Right subcapital femoral neck fracture is noted with impaction at the fracture site. Pelvic ring is intact. No other focal abnormality is seen. IMPRESSION: Right subcapital  femoral neck fracture. Electronically Signed   By: Inez Catalina M.D.   On: 05/14/2021 22:54   CT L-SPINE NO CHARGE  Result Date: 05/02/2021 CLINICAL DATA:  Fall EXAM: CT LUMBAR SPINE WITHOUT CONTRAST TECHNIQUE: Multidetector CT imaging of the lumbar spine was performed without intravenous contrast administration. Multiplanar CT image reconstructions were also generated. COMPARISON:  None. FINDINGS: Segmentation: 5 lumbar type vertebrae. Alignment: Grade 1 anterolisthesis at L4-5 Vertebrae: No acute fracture or focal pathologic process. Paraspinal and other soft tissues: Negative. Disc levels: No spinal canal or neural foraminal stenosis. IMPRESSION: Grade 1 anterolisthesis at L4-5 without spinal canal or neural foraminal stenosis. Electronically Signed   By: Ulyses Jarred M.D.   On: 05/25/2021 23:40   DG Chest Port 1 View  Result Date: 05/18/2021 CLINICAL DATA:  Recent fall with chest pain, initial encounter EXAM: PORTABLE CHEST 1 VIEW COMPARISON:  06/23/2018 FINDINGS: Cardiac shadow is mildly prominent. Postsurgical changes are again seen. Bilateral breast implants are noted causing generalized density over the chest bilaterally. Mild vascular congestion  is seen. No focal infiltrate or effusion is noted. No acute bony abnormality is seen. Multiple old rib fractures are noted on the right. IMPRESSION: Mild vascular congestion. Generalized density over the chest is related to overlying breast implants. No other acute abnormality is noted. Electronically Signed   By: Inez Catalina M.D.   On: 05/25/2021 22:51   ECHOCARDIOGRAM COMPLETE  Result Date: 05/20/2021    ECHOCARDIOGRAM REPORT   Patient Name:   KAYSEA RAYA Date of Exam: 05/20/2021 Medical Rec #:  [id number]        Height:       63.5 in Accession #:    [id number]       Weight:       138.2 lb Date of Birth:  09/20/1950        BSA:          1.662 m Patient Age:    69 years         BP:           102/65 mmHg Patient Gender: F                HR:            93 bpm. Exam Location:  Inpatient Procedure: 2D Echo, Cardiac Doppler and Color Doppler Indications:    Cardiomyopathy-Unspecified I42.9  History:        Patient has prior history of Echocardiogram examinations, most                 recent 07/31/2006. CAD, TIA and Stroke, Arrythmias:Atrial                 Fibrillation and Atrial Flutter; Risk Factors:Dyslipidemia. CKD                 (chronic kidney disease), stage III, Hx of long-term (current)                 use of anticoagulants, Breast cancer with bilateral implants,                 H/O aortic valve replacement and mitral valve replacement, WPW                 syndrome.  Sonographer:    Alvino Chapel RCS Referring Phys: [id number] [facility] AMIN  Sonographer Comments: Technically difficult study due to poor echo windows. Image acquisition challenging due to breast implants. IMPRESSIONS  1. Left ventricular ejection fraction, by estimation, is 55 to 60%. The left ventricle has normal function. Left ventricular endocardial border not optimally defined to evaluate regional wall motion. Left ventricular diastolic parameters are indeterminate.  2. Right ventricular systolic function is normal. The right ventricular size is mildly enlarged.  3. Right atrial size was moderately dilated.  4. The mitral valve has been repaired/replaced. No evidence of mitral valve regurgitation. No evidence of mitral stenosis.  5. Tricuspid valve regurgitation is severe.  6. The aortic valve was not well visualized. Aortic valve regurgitation is not visualized. No aortic stenosis is present. Aortic valve mean gradient measures 7.0 mmHg. Aortic valve Vmax measures 1.79 m/s.  7. The inferior vena cava is normal in size with greater than 50% respiratory variability, suggesting right atrial pressure of 3 mmHg.  8. Very limited echo due to poor sound wave transmission. FINDINGS  Left Ventricle: Left ventricular ejection fraction, by estimation, is 55 to 60%. The left ventricle has normal  function. Left ventricular endocardial border not optimally defined to evaluate regional wall  motion. The left ventricular internal cavity size was normal in size. There is no left ventricular hypertrophy. Left ventricular diastolic parameters are indeterminate. Right Ventricle: The right ventricular size is mildly enlarged. No increase in right ventricular wall thickness. Right ventricular systolic function is normal. Left Atrium: Left atrial size was not well visualized. Right Atrium: Right atrial size was moderately dilated. Pericardium: There is no evidence of pericardial effusion. Mitral Valve: The mitral valve has been repaired/replaced. No evidence of mitral valve regurgitation. There is a mechanical valve present in the mitral position. No evidence of mitral valve stenosis. MV peak gradient, 10.0 mmHg. The mean mitral valve gradient is 4.0 mmHg. Tricuspid Valve: The tricuspid valve is grossly normal. Tricuspid valve regurgitation is severe. No evidence of tricuspid stenosis. Aortic Valve: The aortic valve was not well visualized. Aortic valve regurgitation is not visualized. No aortic stenosis is present. Aortic valve mean gradient measures 7.0 mmHg. Aortic valve peak gradient measures 12.8 mmHg. There is a mechanical valve present in the aortic position. Pulmonic Valve: The pulmonic valve was not well visualized. Pulmonic valve regurgitation is not visualized. No evidence of pulmonic stenosis. Aorta: The aortic root is normal in size and structure. Venous: The inferior vena cava is normal in size with greater than 50% respiratory variability, suggesting right atrial pressure of 3 mmHg. IAS/Shunts: No atrial level shunt detected by color flow Doppler.   Diastology LV e' medial:    4.90 cm/s LV E/e' medial:  29.2 LV e' lateral:   8.59 cm/s LV E/e' lateral: 16.6  RIGHT VENTRICLE RV S prime:     6.31 cm/s TAPSE (M-mode): 1.0 cm LEFT ATRIUM           Index        RIGHT ATRIUM           Index LA Vol (A4C): 62.5  ml 37.60 ml/m  RA Area:     17.10 cm                                    RA Volume:   48.40 ml  29.11 ml/m  AORTIC VALVE AV Vmax:           179.00 cm/s AV Vmean:          115.000 cm/s AV VTI:            0.282 m AV Peak Grad:      12.8 mmHg AV Mean Grad:      7.0 mmHg LVOT Vmax:         95.60 cm/s LVOT Vmean:        59.200 cm/s LVOT VTI:          0.149 m LVOT/AV VTI ratio: 0.53 MITRAL VALVE                TRICUSPID VALVE MV Area (PHT): 5.27 cm     TR Peak grad:   33.9 mmHg MV Peak grad:  10.0 mmHg    TR Vmax:        291.00 cm/s MV Mean grad:  4.0 mmHg MV Vmax:       1.58 m/s     SHUNTS MV Vmean:      89.2 cm/s    Systemic VTI: 0.15 m MV Decel Time: 144 msec MV E velocity: 143.00 cm/s Glori Bickers MD Electronically signed by Glori Bickers MD Signature Date/Time: 05/20/2021/4:17:43 PM    Final  DG Femur Portable Min 2 Views Right  Result Date: 05/16/2021 CLINICAL DATA:  Recent fall with right leg pain, initial encounter EXAM: RIGHT FEMUR PORTABLE 2 VIEW COMPARISON:  None. FINDINGS: Subcapital right femoral neck fracture is noted with impaction and angulation at the fracture site. The more distal femur appears within normal limits. IMPRESSION: Right femoral neck fracture as described. Electronically Signed   By: Inez Catalina M.D.   On: 05/05/2021 22:54    Cardiac Studies   Echo 05/20/21  1. Left ventricular ejection fraction, by estimation, is 55 to 60%. The  left ventricle has normal function. Left ventricular endocardial border  not optimally defined to evaluate regional wall motion. Left ventricular  diastolic parameters are  indeterminate.   2. Right ventricular systolic function is normal. The right ventricular  size is mildly enlarged.   3. Right atrial size was moderately dilated.   4. The mitral valve has been repaired/replaced. No evidence of mitral  valve regurgitation. No evidence of mitral stenosis.   5. Tricuspid valve regurgitation is severe.   6. The aortic valve was not  well visualized. Aortic valve regurgitation  is not visualized. No aortic stenosis is present. Aortic valve mean  gradient measures 7.0 mmHg. Aortic valve Vmax measures 1.79 m/s.   7. The inferior vena cava is normal in size with greater than 50%  respiratory variability, suggesting right atrial pressure of 3 mmHg.   8. Very limited echo due to poor sound wave transmission.   Patient Profile     70 y.o. female with a hx of mechanical mitral and aortic valve replacement at [facility] in 2008 on chronic Coumadin therapy, mild LV dysfunction with baseline EF 50%, PAF/flutter, constrictive pericarditis s/p pericardiectomy, HTN, HLD, hypothyroidism, osteopenia, venous insufficiency, Hodgkin's disease in 1974 s/p Mantle field radiation and chemo, left breast cancer and nonobstructive CAD diagnosed in 2008 who is being seen 05/20/2021 for the evaluation of preoperative clearance prior to R hip surgery at the request of Dr. Reesa Chew.  Assessment & Plan    Preoperative cardiac evaluation: for hip surgery after fall -asymptomatic, echo is stable -recommendations from initial consult is for surgery without further work-up.  Would be at intermediate to high risk for this intermediate risk procedure.  Mechanical aortic and mitral valves: Replaced in 2008. -coumadin washing out -started heparin for INR <2.5, INR 2.4 today -post op, bridge heparin to coumadin until INR >2.5 -would restart aspirin as soon as able given mechanical valves  Coronary artery disease Hyperlipidemia -Last catheterization with moderate coronary artery disease. 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 Will give single dose of IV  lasix, would use PRN perioperatively and restart post op when appropriate  May be transferred to [facility] if surgery is to be done there. Please just let our team know and we will continue to follow  For questions or updates, please contact [place] Please consult [email or web address] for contact info under     Signed, Buford Dresser, MD  05/21/2021, 10:10 AM

## 2021-05-21 NOTE — Progress Notes (Signed)
PT Cancellation Note  Patient Details Name: Grace [name] MRN: [id number] DOB: [birth year]   Cancelled Treatment:    Reason Eval/Treat Not Completed: (P) Patient not medically ready Pt is awaiting orthopedic sx for R femoral neck fracture 12/26 or 12/27. PT will follow back after surgery.    Carmichaels 05/21/2021, 8:19 AM

## 2021-05-21 NOTE — Progress Notes (Signed)
OT Cancellation Note  Patient Details Name: ADIANNA DARWIN MRN: [id number] DOB: [birth year]   Cancelled Treatment:    Reason Eval/Treat Not Completed: Patient not medically ready- Pt is awaiting orthopedic sx for R femoral neck fracture 12/26 or 12/27.  Will follow and see after surgery.  Jolaine Artist, OT Acute Rehabilitation Services Pager [phone] Office [phone]   Delight Stare 05/21/2021, 8:29 AM

## 2021-05-21 NOTE — Progress Notes (Signed)
ANTICOAGULATION CONSULT NOTE - Follow Up Consult  Pharmacy Consult for IV Heparin Bridge (on Warfarin PTA) Indication: atrial fibrillation and mechanical mitral and aortic valve  Allergies  Allergen Reactions   Digoxin Other (See Comments)    bradycardia   Statins     myalgia    Patient Measurements: Height: 5' 3.5" (161.3 cm) Weight: 62.7 kg (138 lb 3.7 oz) IBW/kg (Calculated) : 53.55 Heparin Dosing Weight: 62.7 kg  Vital Signs: Temp: 98.2 F (36.8 C) (12/26 0757) Temp Source: Oral (12/26 0757) BP: 128/68 (12/26 0757) Pulse Rate: 94 (12/26 0759)  Labs: Recent Labs    04/29/2021 2147 05/20/21 0836 05/20/21 1107 05/21/21 0432 05/21/21 1629  HGB 11.7* 11.1*  --  10.7*  --   HCT 38.2 35.3*  --  33.8*  --   PLT 501* 455*  --  400  --   LABPROT 29.9*  --  31.9* 25.9*  --   INR 2.9*  --  3.1* 2.4*  --   HEPARINUNFRC  --   --   --   --  <0.10*  CREATININE 1.04* 0.97  --  0.94  --     Estimated Creatinine Clearance: 47.1 mL/min (by C-G formula based on SCr of 0.94 mg/dL).  Medical History: Past Medical History:  Diagnosis Date   Anemia    Aortic insufficiency    AORTIC VALVE REPLACEMENT, HX OF 04/27/2007   Qualifier: Diagnosis of  By: Leanne Chang MD, Bruce     Cancer [facility])    left breast- surgery and chemo   CHF (congestive heart failure) ([place])    Chronic kidney disease    Stage 3   Congestive heart failure ([place]) 08/03/2009   [facility] Dr. Paul Half. Last echo 2010 with EF 30%. Restrictive due to radiation. AVR MVR. 02/2014 visit planned.     Coronary artery disease    11/06/06 [facility]): 40% oLM, 60% dLAD, 30% oRCA   Dyspnea    with exertion   Dysrhythmia    paroxsymal a-flutter 2018; intermittent left BBB (2016)   Edema of both feet    GI bleed    Heart murmur    mitral and aortic valve replacement   Hodgkin's disease    ~1974, s/p [place] radiation and chemo   Hyperlipidemia    Hypertension    Hypothyroidism    Migraine    none since 2008   MITRAL VALVE  REPLACEMENT, HX OF 04/27/2007   Qualifier: Diagnosis of  By: Leanne Chang MD, Bruce     Pericarditis    s/p pericardidal stripping through lateral thoracotomy 01/2007 [facility])   Stroke ([place]) 2008   No residual effects   TIA (transient ischemic attack)    Tricuspid regurgitation    Varicose veins of both lower extremities    left worse than right- have gotten larger in April 2020   WPW (Wolff-Parkinson-White syndrome)     Assessment: 70 yr old female presented with R hip fracture after mechanical fall, pending possible right hip surgery. Patient on warfarin PTA (last dose 12/24) for atrial fibrillation and mechanical aortic and mechanical mitral valve replacement. Warfarin is on hold in anticipation of surgery. Pharmacy asked to begin IV heparin as a bridge once INR <2.5.  INR this am is 2.4, PO vitamin K 10 mg was given 12/25. CBC stable. Initial heparin level ~ 8.5 hrs after heparin infusion was initiated at 800 units/hr was <0.10 units/ml, which is below the goal range for this pt. Per RN, no issues with IV or bleeding  observed.  Per ortho note, pt may have surgery tomorrow, 12/27.  Goal of Therapy:  Heparin level 0.3-0.7 units/ml Monitor platelets by anticoagulation protocol: Yes   Plan:  Continue to hold warfarin Increase heparin infusion to 1000 units/hr Check heparin level in ~7 hrs Monitor daily heparin level, CBC Monitor for bleeding  Gillermina Hu, PharmD, BCPS, [facility] Clinical Pharmacist 05/21/2021

## 2021-05-21 NOTE — Progress Notes (Signed)
PROGRESS NOTE    BETUL BRISKY  HYQ:[id number] DOB: 1951-04-20 DOA: 05/22/2021 PCP: Marin Olp, MD   Brief Narrative:  70 year old with history of aortic and mitral valve replacement with mechanical valve, A. fib on Coumadin, systolic CHF, CKD stage IIIa, history of breast cancer, aortic insufficiency, Hodgkin's disease, history of TIA came to the ER with complaints of mechanical fall sustaining right femoral neck fracture confirmed on the CT scan.  Denies any loss of consciousness.  Orthopedic was consulted.  Chest x-ray showed mild vascular congestion.  Patient has been started on heparin drip, Coumadin has been held.  Seen by cardiology, repeat echo shows dilated cardiomyopathy with EF 55 to 60%, severe TR.   Assessment & Plan:   Principal Problem:   Closed right femoral fracture (HCC) Active Problems:   Hypothyroidism   Hyperlipidemia   Congestive heart failure (HCC)   History of cardiovascular disorder   CKD (chronic kidney disease), stage III (HCC)   Atrial flutter (HCC)   Hx of long-term (current) use of anticoagulants   CAD (coronary artery disease)   Mechanical fall causing displaced right femoral neck fracture - Orthopedic consulted.  INR is 2.9, ideally would want INR to drop to below 1.5 for surgery.  Currently on heparin drip. Orthopedic is following.  Pain control, bowel regimen.  Postop will need PT/OT.  Given extensive cardiac history she is intermediate to high risk but necessary surgery to gain her mobility and control her pain.  Nausea and vomiting - Resolved  History of mechanical valve replacement, aortic and mitral Severe tricuspid regurgitation - Performed at [facility] in 2012 per the patient.  Goal INR 2.5-3.5.  Coumadin on hold, allowing to drift down.  INR today 2.4.  Currently on heparin drip. Will cut aspirin soon after surgery  Hypothyroidism - Synthroid  Mild CHF congestive heart failure reduced ejection fraction, EF 55% - Last echo I can  find in epic under care everywhere is in January 2019 showing EF of 35%.  Mechanical prosthetic aortic and mitral valve noted -Repeat echocardiogram shows EF of 55%, dilated cardiomyopathy, replaced valve noted.  Severe TR. - Home meds Lasix, Toprol-XL, Aldactone.  Stop IV fluids, IV dose of Lasix to be given due to elevated JVD  CKD stage II -Renal function at baseline around 1.2 Cr  History of atrial fibrillation with flutter - On Coumadin  Coronary artery disease - Chest pain-free  History of constrictive pericarditis - Status post pericardiectomy  Hodgkin's lymphoma History of breast cancer status post bilateral mastectomy and reconstruction - Status post radiation and chemo.  Dr. Alen Blew outpatient  Iron deficiency anemia - Follows outpatient Dr. Alen Blew  Chronic venous insufficiency - Follows outpatient vascular, Dr. Doren Custard  DVT prophylaxis: Heparin drip Code Status: Full code Family Communication:    Status is: Inpatient  Remains inpatient appropriate because: Maintain hospital stay for orthopedic evaluation and surgery once INR drifts down    Subjective: Patient's nausea is better no other complaints.   Examination: Constitutional: Not in acute distress Respiratory: Clear to auscultation bilaterally Cardiovascular: Normal sinus rhythm, no rubs, systolic murmur/click heard, 12 cm JVD Abdomen: Nontender nondistended good bowel sounds Musculoskeletal: No edema noted Skin: No rashes seen Neurologic: CN 2-12 grossly intact.  And nonfocal Psychiatric: Normal judgment and insight. Alert and oriented x 3. Normal mood.    Objective: Vitals:   05/20/21 1456 05/20/21 1955 05/20/21 2356 05/21/21 0346  BP:  126/69 117/65 119/62  Pulse:  98 92 94  Resp:  20 17 (!)  21  Temp: 99.2 F (37.3 C) 99.4 F (37.4 C) 98.2 F (36.8 C) 98.1 F (36.7 C)  TempSrc: Oral Oral Oral Oral  SpO2:  95% 95% 96%  Weight:      Height:       No intake or output data in the 24 hours  ending 05/21/21 0757 Filed Weights   05/20/21 1122  Weight: 62.7 kg     Data Reviewed:   CBC: Recent Labs  Lab 04/28/2021 2147 05/20/21 0836 05/21/21 0432  WBC 13.8* 21.1* 17.7*  NEUTROABS 10.9*  --   --   HGB 11.7* 11.1* 10.7*  HCT 38.2 35.3* 33.8*  MCV 92.0 90.3 90.1  PLT 501* 455* 353   Basic Metabolic Panel: Recent Labs  Lab 05/26/2021 2147 05/20/21 0836 05/21/21 0432  NA 136 134* 132*  K 4.0 4.3 4.6  CL 102 102 100  CO2 27 26 21*  GLUCOSE 95 172* 110*  BUN 22 19 16   CREATININE 1.04* 0.97 0.94  CALCIUM 8.3* 8.1* 7.7*  MG  --   --  2.1   GFR: Estimated Creatinine Clearance: 47.1 mL/min (by C-G formula based on SCr of 0.94 mg/dL). Liver Function Tests: Recent Labs  Lab 05/18/2021 2147 05/20/21 0836  AST 31 30  ALT 21 21  ALKPHOS 72 70  BILITOT 0.5 0.7  PROT 7.0 6.7  ALBUMIN 3.7 3.4*   No results for input(s): LIPASE, AMYLASE in the last 168 hours. No results for input(s): AMMONIA in the last 168 hours. Coagulation Profile: Recent Labs  Lab 05/17/2021 2147 05/20/21 1107 05/21/21 0432  INR 2.9* 3.1* 2.4*   Cardiac Enzymes: No results for input(s): CKTOTAL, CKMB, CKMBINDEX, TROPONINI in the last 168 hours. BNP (last 3 results) No results for input(s): PROBNP in the last 8760 hours. HbA1C: No results for input(s): HGBA1C in the last 72 hours. CBG: No results for input(s): GLUCAP in the last 168 hours. Lipid Profile: No results for input(s): CHOL, HDL, LDLCALC, TRIG, CHOLHDL, LDLDIRECT in the last 72 hours. Thyroid Function Tests: No results for input(s): TSH, T4TOTAL, FREET4, T3FREE, THYROIDAB in the last 72 hours. Anemia Panel: No results for input(s): VITAMINB12, FOLATE, FERRITIN, TIBC, IRON, RETICCTPCT in the last 72 hours. Sepsis Labs: No results for input(s): PROCALCITON, LATICACIDVEN in the last 168 hours.  Recent Results (from the past 240 hour(s))  Resp Panel by RT-PCR (Flu A&B, Covid) Nasopharyngeal Swab     Status: None   Collection  Time: 05/20/21 12:02 AM   Specimen: Nasopharyngeal Swab; Nasopharyngeal(NP) swabs in vial transport medium  Result Value Ref Range Status   SARS Coronavirus 2 by RT PCR NEGATIVE NEGATIVE Final    Comment: (NOTE) SARS-CoV-2 target nucleic acids are NOT DETECTED.  The SARS-CoV-2 RNA is generally detectable in upper respiratory specimens during the acute phase of infection. The lowest concentration of SARS-CoV-2 viral copies this assay can detect is 138 copies/mL. A negative result does not preclude SARS-Cov-2 infection and should not be used as the sole basis for treatment or other patient management decisions. A negative result may occur with  improper specimen collection/handling, submission of specimen other than nasopharyngeal swab, presence of viral mutation(s) within the areas targeted by this assay, and inadequate number of viral copies(<138 copies/mL). A negative result must be combined with clinical observations, patient history, and epidemiological information. The expected result is Negative.  Fact Sheet for Patients:  EntrepreneurPulse.com.au  Fact Sheet for Healthcare Providers:  IncredibleEmployment.be  This test is no t yet approved or cleared  by the [place] and  has been authorized for detection and/or diagnosis of SARS-CoV-2 by FDA under an Emergency Use Authorization (EUA). This EUA will remain  in effect (meaning this test can be used) for the duration of the COVID-19 declaration under Section 564(b)(1) of the Act, 21 U.S.C.section 360bbb-3(b)(1), unless the authorization is terminated  or revoked sooner.       Influenza A by PCR NEGATIVE NEGATIVE Final   Influenza B by PCR NEGATIVE NEGATIVE Final    Comment: (NOTE) The Xpert Xpress SARS-CoV-2/FLU/RSV plus assay is intended as an aid in the diagnosis of influenza from Nasopharyngeal swab specimens and should not be used as a sole basis for treatment. Nasal washings  and aspirates are unacceptable for Xpert Xpress SARS-CoV-2/FLU/RSV testing.  Fact Sheet for Patients: EntrepreneurPulse.com.au  Fact Sheet for Healthcare Providers: IncredibleEmployment.be  This test is not yet approved or cleared by the [place] FDA and has been authorized for detection and/or diagnosis of SARS-CoV-2 by FDA under an Emergency Use Authorization (EUA). This EUA will remain in effect (meaning this test can be used) for the duration of the COVID-19 declaration under Section 564(b)(1) of the Act, 21 U.S.C. section 360bbb-3(b)(1), unless the authorization is terminated or revoked.  Performed at [facility], [place] [address], [place], [place] [address]          Radiology Studies: CT ABDOMEN PELVIS WO CONTRAST  Result Date: 05/05/2021 CLINICAL DATA:  Recent fall on the right side with pain and known right femoral fracture, initial encounter EXAM: CT ABDOMEN AND PELVIS WITHOUT CONTRAST TECHNIQUE: Multidetector CT imaging of the abdomen and pelvis was performed following the standard protocol without IV contrast. COMPARISON:  01/27/2013 FINDINGS: Lower chest: Mild scarring is noted in the lung bases. Breast implants are noted. Hepatobiliary: No focal liver abnormality is seen. No gallstones, gallbladder wall thickening, or biliary dilatation. Pancreas: Unremarkable. No pancreatic ductal dilatation or surrounding inflammatory changes. Spleen: Surgically removed Adrenals/Urinary Tract: Right adrenal gland is within normal limits. Stable left adrenal lesion is noted consistent with the an adenoma. This measures up to 3.7 cm. Right kidney is somewhat ptotic. No renal calculi or obstructive changes are noted. Ureters are within normal limits. Bladder is well distended. Stomach/Bowel: Appendix is within normal limits. Colon shows no obstructive or inflammatory changes. No small bowel abnormality is noted. Stomach is well distended.  Vascular/Lymphatic: No significant vascular findings are present. No enlarged abdominal or pelvic lymph nodes. Reproductive: Uterus and bilateral adnexa are unremarkable. Other: Postsurgical changes are noted in the upper abdomen stable from the prior exam. No free fluid is noted. Musculoskeletal: Right subcapital femoral neck fracture is noted. Femoral head is well seated. Chronic sclerosis is noted within the sacrum bilaterally consistent with healed insufficiency fractures. Undisplaced right sacral fracture is noted best seen on the sagittal images number 59 of series 7. Old rib fractures are noted on the right. No acute bony abnormality is noted. IMPRESSION: Right subcapital femoral neck fractures similar to that seen on prior plain film. Undisplaced right sacral fracture is noted best seen on the sagittal images. Adjacent sclerotic healed insufficiency fractures are noted as well. Left adrenal mass is noted likely representing an adenoma stable from the prior exam from 2014. Electronically Signed   By: Inez Catalina M.D.   On: 05/11/2021 23:23   DG Pelvis Portable  Result Date: 05/21/2021 CLINICAL DATA:  Recent trip and fall with pelvic pain, initial encounter EXAM: PORTABLE PELVIS 1 VIEWS COMPARISON:  None. FINDINGS:  Right subcapital femoral neck fracture is noted with impaction at the fracture site. Pelvic ring is intact. No other focal abnormality is seen. IMPRESSION: Right subcapital femoral neck fracture. Electronically Signed   By: Inez Catalina M.D.   On: 05/26/2021 22:54   CT L-SPINE NO CHARGE  Result Date: 05/04/2021 CLINICAL DATA:  Fall EXAM: CT LUMBAR SPINE WITHOUT CONTRAST TECHNIQUE: Multidetector CT imaging of the lumbar spine was performed without intravenous contrast administration. Multiplanar CT image reconstructions were also generated. COMPARISON:  None. FINDINGS: Segmentation: 5 lumbar type vertebrae. Alignment: Grade 1 anterolisthesis at L4-5 Vertebrae: No acute fracture or focal  pathologic process. Paraspinal and other soft tissues: Negative. Disc levels: No spinal canal or neural foraminal stenosis. IMPRESSION: Grade 1 anterolisthesis at L4-5 without spinal canal or neural foraminal stenosis. Electronically Signed   By: Ulyses Jarred M.D.   On: 04/26/2021 23:40   DG Chest Port 1 View  Result Date: 05/07/2021 CLINICAL DATA:  Recent fall with chest pain, initial encounter EXAM: PORTABLE CHEST 1 VIEW COMPARISON:  06/23/2018 FINDINGS: Cardiac shadow is mildly prominent. Postsurgical changes are again seen. Bilateral breast implants are noted causing generalized density over the chest bilaterally. Mild vascular congestion is seen. No focal infiltrate or effusion is noted. No acute bony abnormality is seen. Multiple old rib fractures are noted on the right. IMPRESSION: Mild vascular congestion. Generalized density over the chest is related to overlying breast implants. No other acute abnormality is noted. Electronically Signed   By: Inez Catalina M.D.   On: 05/14/2021 22:51   ECHOCARDIOGRAM COMPLETE  Result Date: 05/20/2021    ECHOCARDIOGRAM REPORT   Patient Name:   RANDYE TREICHLER Date of Exam: 05/20/2021 Medical Rec #:  [id number]        Height:       63.5 in Accession #:    [id number]       Weight:       138.2 lb Date of Birth:  January 27, 1951        BSA:          1.662 m Patient Age:    68 years         BP:           102/65 mmHg Patient Gender: F                HR:           93 bpm. Exam Location:  Inpatient Procedure: 2D Echo, Cardiac Doppler and Color Doppler Indications:    Cardiomyopathy-Unspecified I42.9  History:        Patient has prior history of Echocardiogram examinations, most                 recent 07/31/2006. CAD, TIA and Stroke, Arrythmias:Atrial                 Fibrillation and Atrial Flutter; Risk Factors:Dyslipidemia. CKD                 (chronic kidney disease), stage III, Hx of long-term (current)                 use of anticoagulants, Breast cancer with bilateral  implants,                 H/O aortic valve replacement and mitral valve replacement, WPW                 syndrome.  Sonographer:    Alvino Chapel RCS Referring Phys: [phone] Thelma Comp  CHIRAG Benuel Ly  Sonographer Comments: Technically difficult study due to poor echo windows. Image acquisition challenging due to breast implants. IMPRESSIONS  1. Left ventricular ejection fraction, by estimation, is 55 to 60%. The left ventricle has normal function. Left ventricular endocardial border not optimally defined to evaluate regional wall motion. Left ventricular diastolic parameters are indeterminate.  2. Right ventricular systolic function is normal. The right ventricular size is mildly enlarged.  3. Right atrial size was moderately dilated.  4. The mitral valve has been repaired/replaced. No evidence of mitral valve regurgitation. No evidence of mitral stenosis.  5. Tricuspid valve regurgitation is severe.  6. The aortic valve was not well visualized. Aortic valve regurgitation is not visualized. No aortic stenosis is present. Aortic valve mean gradient measures 7.0 mmHg. Aortic valve Vmax measures 1.79 m/s.  7. The inferior vena cava is normal in size with greater than 50% respiratory variability, suggesting right atrial pressure of 3 mmHg.  8. Very limited echo due to poor sound wave transmission. FINDINGS  Left Ventricle: Left ventricular ejection fraction, by estimation, is 55 to 60%. The left ventricle has normal function. Left ventricular endocardial border not optimally defined to evaluate regional wall motion. The left ventricular internal cavity size was normal in size. There is no left ventricular hypertrophy. Left ventricular diastolic parameters are indeterminate. Right Ventricle: The right ventricular size is mildly enlarged. No increase in right ventricular wall thickness. Right ventricular systolic function is normal. Left Atrium: Left atrial size was not well visualized. Right Atrium: Right atrial size was  moderately dilated. Pericardium: There is no evidence of pericardial effusion. Mitral Valve: The mitral valve has been repaired/replaced. No evidence of mitral valve regurgitation. There is a mechanical valve present in the mitral position. No evidence of mitral valve stenosis. MV peak gradient, 10.0 mmHg. The mean mitral valve gradient is 4.0 mmHg. Tricuspid Valve: The tricuspid valve is grossly normal. Tricuspid valve regurgitation is severe. No evidence of tricuspid stenosis. Aortic Valve: The aortic valve was not well visualized. Aortic valve regurgitation is not visualized. No aortic stenosis is present. Aortic valve mean gradient measures 7.0 mmHg. Aortic valve peak gradient measures 12.8 mmHg. There is a mechanical valve present in the aortic position. Pulmonic Valve: The pulmonic valve was not well visualized. Pulmonic valve regurgitation is not visualized. No evidence of pulmonic stenosis. Aorta: The aortic root is normal in size and structure. Venous: The inferior vena cava is normal in size with greater than 50% respiratory variability, suggesting right atrial pressure of 3 mmHg. IAS/Shunts: No atrial level shunt detected by color flow Doppler.   Diastology LV e' medial:    4.90 cm/s LV E/e' medial:  29.2 LV e' lateral:   8.59 cm/s LV E/e' lateral: 16.6  RIGHT VENTRICLE RV S prime:     6.31 cm/s TAPSE (M-mode): 1.0 cm LEFT ATRIUM           Index        RIGHT ATRIUM           Index LA Vol (A4C): 62.5 ml 37.60 ml/m  RA Area:     17.10 cm                                    RA Volume:   48.40 ml  29.11 ml/m  AORTIC VALVE AV Vmax:           179.00 cm/s AV Vmean:  115.000 cm/s AV VTI:            0.282 m AV Peak Grad:      12.8 mmHg AV Mean Grad:      7.0 mmHg LVOT Vmax:         95.60 cm/s LVOT Vmean:        59.200 cm/s LVOT VTI:          0.149 m LVOT/AV VTI ratio: 0.53 MITRAL VALVE                TRICUSPID VALVE MV Area (PHT): 5.27 cm     TR Peak grad:   33.9 mmHg MV Peak grad:  10.0 mmHg    TR  Vmax:        291.00 cm/s MV Mean grad:  4.0 mmHg MV Vmax:       1.58 m/s     SHUNTS MV Vmean:      89.2 cm/s    Systemic VTI: 0.15 m MV Decel Time: 144 msec MV E velocity: 143.00 cm/s Glori Bickers MD Electronically signed by Glori Bickers MD Signature Date/Time: 05/20/2021/4:17:43 PM    Final    DG Femur Portable Min 2 Views Right  Result Date: 05/13/2021 CLINICAL DATA:  Recent fall with right leg pain, initial encounter EXAM: RIGHT FEMUR PORTABLE 2 VIEW COMPARISON:  None. FINDINGS: Subcapital right femoral neck fracture is noted with impaction and angulation at the fracture site. The more distal femur appears within normal limits. IMPRESSION: Right femoral neck fracture as described. Electronically Signed   By: Inez Catalina M.D.   On: 04/29/2021 22:54        Scheduled Meds:  cholecalciferol  1,000 Units Oral Daily   docusate sodium  100 mg Oral QPM   ezetimibe  10 mg Oral QPM   levothyroxine  100 mcg Oral Q0600   metoprolol succinate  50 mg Oral BID   pantoprazole  40 mg Oral Daily   Continuous Infusions:  heparin       LOS: 2 days   Time spent= 35 mins    Pamla Pangle Arsenio Loader, MD [facility]  If 7PM-7AM, please contact night-coverage  05/21/2021, 7:57 AM

## 2021-05-22 DIAGNOSIS — Z952 Presence of prosthetic heart valve: Secondary | ICD-10-CM | POA: Diagnosis not present

## 2021-05-22 LAB — HEPARIN LEVEL (UNFRACTIONATED)
Heparin Unfractionated: 0.11 IU/mL — ABNORMAL LOW (ref 0.30–0.70)
Heparin Unfractionated: 0.28 IU/mL — ABNORMAL LOW (ref 0.30–0.70)
Heparin Unfractionated: 0.27 IU/mL — ABNORMAL LOW (ref 0.30–0.70)
Heparin Unfractionated: 0.3 IU/mL (ref 0.30–0.70)

## 2021-05-22 LAB — CBC
HCT: 32.5 % — ABNORMAL LOW (ref 36.0–46.0)
Hemoglobin: 10.2 g/dL — ABNORMAL LOW (ref 12.0–15.0)
MCH: 28.1 pg (ref 26.0–34.0)
MCHC: 31.4 g/dL (ref 30.0–36.0)
MCV: 89.5 fL (ref 80.0–100.0)
Platelets: 387 10*3/uL (ref 150–400)
RBC: 3.63 MIL/uL — ABNORMAL LOW (ref 3.87–5.11)
RDW: 15.6 % — ABNORMAL HIGH (ref 11.5–15.5)
WBC: 16.4 10*3/uL — ABNORMAL HIGH (ref 4.0–10.5)
nRBC: 0.1 % (ref 0.0–0.2)

## 2021-05-22 LAB — PROTIME-INR
INR: 1.1 (ref 0.8–1.2)
Prothrombin Time: 14.6 seconds (ref 11.4–15.2)

## 2021-05-22 LAB — BASIC METABOLIC PANEL
Anion gap: 11 (ref 5–15)
BUN: 15 mg/dL (ref 8–23)
CO2: 22 mmol/L (ref 22–32)
Calcium: 7.5 mg/dL — ABNORMAL LOW (ref 8.9–10.3)
Chloride: 99 mmol/L (ref 98–111)
Creatinine, Ser: 0.99 mg/dL (ref 0.44–1.00)
GFR, Estimated: >60 mL/min (ref >60)
Glucose, Bld: 102 mg/dL — ABNORMAL HIGH (ref 70–99)
Potassium: 4 mmol/L (ref 3.5–5.1)
Sodium: 132 mmol/L — ABNORMAL LOW (ref 135–145)

## 2021-05-22 LAB — MAGNESIUM: Magnesium: 2 mg/dL (ref 1.7–2.4)

## 2021-05-22 MED ORDER — METOPROLOL TARTRATE 25 MG PO TABS: 25.0000 mg | ORAL_TABLET | Freq: Two times a day (BID) | ORAL | Status: DC

## 2021-05-22 MED ORDER — HEPARIN BOLUS VIA INFUSION
1000.0000 [IU] | Freq: Once | INTRAVENOUS | Status: AC
Start: 2021-05-22 — End: 2021-05-22
  Administered 2021-05-22: 15:00:00 1000 [IU] via INTRAVENOUS
  Filled 2021-05-22: qty 1000

## 2021-05-22 MED ORDER — HEPARIN BOLUS VIA INFUSION
2000.0000 [IU] | Freq: Once | INTRAVENOUS | Status: AC
  Administered 2021-05-22: 05:00:00 2000 [IU] via INTRAVENOUS
  Filled 2021-05-22: qty 2000

## 2021-05-22 MED ORDER — FUROSEMIDE 10 MG/ML IJ SOLN
20.0000 mg | Freq: Once | INTRAMUSCULAR | Status: AC
  Administered 2021-05-22: 10:00:00 20 mg via INTRAVENOUS
  Filled 2021-05-22: qty 2

## 2021-05-22 MED ORDER — HEPARIN (PORCINE) 25000 UT/250ML-% IV SOLN
1350.0000 [IU]/h | INTRAVENOUS | Status: AC
  Administered 2021-05-22: 10:00:00 1250 [IU]/h via INTRAVENOUS
  Administered 2021-05-23: 03:00:00 1350 [IU]/h via INTRAVENOUS
  Filled 2021-05-22 (×2): qty 250

## 2021-05-22 NOTE — Progress Notes (Signed)
Orthopaedics Daily Progress Note   05/22/2021   7:47 AM  Grace [name] is a 70 y.o. female   with right displaced femoral neck fracture.  Subjective Pain appropriately controled.  Denies nausea, vomiting, or fevers.   Objective Vitals:   05/21/21 1948 05/22/21 0440  BP: 106/60 (!) 117/59  Pulse: 98   Resp: 20 20  Temp: 97.8 F (36.6 C) 98.7 F (37.1 C)  SpO2: 97%     Intake/Output Summary (Last 24 hours) at 05/22/2021 0747 Last data filed at 05/22/2021 0056 Gross per 24 hour  Intake 388.83 ml  Output --  Net 388.83 ml    INR  Date/Time Value Ref Range Status  05/22/2021 02:58 AM 1.1 0.8 - 1.2 Final    Comment:    (NOTE) INR goal varies based on device and disease states. Performed at [facility], [place] [address], [place], [place] [address]   05/21/2021 04:32 AM 2.4 (H) 0.8 - 1.2 Final    Comment:    (NOTE) INR goal varies based on device and disease states. Performed at [facility], [place] [address], [place], [place] [address]   05/20/2021 11:07 AM 3.1 (H) 0.8 - 1.2 Final    Comment:    (NOTE) INR goal varies based on device and disease states. Performed at [facility], [place] [address] [place], [place] [address]    Physical Exam RLE: +DF/PF/EHL SILT SP/DP/T +DP/PT and WWP distally  Assessment 70 y.o. female with right displaced femoral neck fracture. INR 1.1 today.   Plan Mobility: Bed rest pending OR Pain control: Continue to wean/titrate to appropriate oral regimen DVT Prophylaxis: Holding in anticipation of OR while INR super-therapeutic Further surgical plans: NPO for OR hopefully today depending on availability RUE: No restrictions LUE: No restrictions RLE: NWB LLE: No restrictions Disposition: Pending OR   Georgeanna Harrison M.D. Orthopaedic Surgery [facility]

## 2021-05-22 NOTE — Progress Notes (Signed)
PT Cancellation Note  Patient Details Name: Grace [name] MRN: [id number] DOB: 04-Sep-1950   Cancelled Treatment:    Reason Eval/Treat Not Completed: Patient not medically ready. Pt is awaiting surgery-on schedule for 12/28. Will sign off at this time. Will await re-order with recs from orthopedic surgeon/PA. Thanks.    [facility]  Office: [phone] Pager: [phone]

## 2021-05-22 NOTE — Progress Notes (Signed)
OT Cancellation Note  Patient Details Name: Grace [name] MRN: [id number] DOB: [birth year]   Cancelled Treatment:    Reason Eval/Treat Not Completed: Patient not medically ready;Medical issues which prohibited therapy Pt on bedrest and awaiting orthopedic sx for R femoral neck fracture, likely today, 12/27. Will follow-up for OT eval after surgery  Layla Maw 05/22/2021, 7:17 AM

## 2021-05-22 NOTE — Care Management (Signed)
°  Transition of Care [facility]) Screening Note   Patient Details  Name: FLO BERROA Date of Birth: 06-05-1950   Transition of Care [facility]) CM/SW Contact:    Carles Collet, RN Phone Number: 05/22/2021, 8:01 AM    Transition of Care Department [facility]) has reviewed patient we will continue to monitor patient advancement through interdisciplinary progression rounds.

## 2021-05-22 NOTE — Progress Notes (Signed)
ANTICOAGULATION CONSULT NOTE - Follow Up Consult  Pharmacy Consult for heparin Indication: Mechanical AV/MV and Afib  Allergies  Allergen Reactions   Digoxin Other (See Comments)    bradycardia   Statins     myalgia   Patient Measurements: Height: 5' 3.5" (161.3 cm) Weight: 62.7 kg (138 lb 3.7 oz) IBW/kg (Calculated) : 53.55  Vital Signs: Temp: 99.5 F (37.5 C) (12/27 1142) Temp Source: Oral (12/27 1142) BP: 109/65 (12/27 1142) Pulse Rate: 102 (12/27 1142)  Labs: Recent Labs    05/20/21 0836 05/20/21 1107 05/21/21 0432 05/21/21 1629 05/22/21 0258 05/22/21 1436  HGB 11.1*  --  10.7*  --  10.2*  --   HCT 35.3*  --  33.8*  --  32.5*  --   PLT 455*  --  400  --  387  --   LABPROT  --  31.9* 25.9*  --  14.6  --   INR  --  3.1* 2.4*  --  1.1  --   HEPARINUNFRC  --   --   --  <0.10* 0.11* 0.27*  CREATININE 0.97  --  0.94  --  0.99  --    Estimated Creatinine Clearance: 44.7 mL/min (by C-G formula based on SCr of 0.99 mg/dL).  Assessment: 70 yo F presenting with right hip fracture after mechanical fall, pending possible right hip surgery. Patient on warfarin PTA (last dose 12/24) for atrial fibrillation and mechanical aortic and mechanical mitral valve replacement. Warfarin on hold in anticipation of surgery. Pharmacy asked to begin IV heparin as a bridge once INR <2.5.  Heparin level borderline low at 0.27. Did have a brief period (30-45 minutes) off this am but has been running ok since. No issues reported.  Goal of Therapy:  Heparin level 0.3-0.7 units/ml Monitor platelets by anticoagulation protocol: Yes   Plan:  Continue to hold warfarin Give heparin 1,000 unit bolus Increase heparin gtt to 1,350 units/hr Check heparin level at 2300 tonight Heparin to stop at 0600 for surgery Monitor CBC, s/s of bleed F/u restart of warfarin / heparin bridge post-op  Elenor Quinones, PharmD, BCPS, BCIDP Clinical Pharmacist 05/22/2021 3:15 PM

## 2021-05-22 NOTE — Progress Notes (Signed)
ANTICOAGULATION CONSULT NOTE - Follow Up Consult  Pharmacy Consult for heparin Indication:  Afib/MVR/AVR  Labs: Recent Labs    05/20/21 0836 05/20/21 1107 05/21/21 0432 05/21/21 1629 05/22/21 0258  HGB 11.1*  --  10.7*  --  10.2*  HCT 35.3*  --  33.8*  --  32.5*  PLT 455*  --  400  --  387  LABPROT  --  31.9* 25.9*  --  14.6  INR  --  3.1* 2.4*  --  1.1  HEPARINUNFRC  --   --   --  <0.10* 0.11*  CREATININE 0.97  --  0.94  --  0.99    Assessment: 70yo female subtherapeutic on heparin after rate change, INR now down to 1.1; no infusion issues or signs of bleeding per RN.  Goal of Therapy:  Heparin level 0.3-0.7 units/ml   Plan:  Will give heparin 2000 units IV bolus x1 and increase heparin infusion by 4 units/kg/hr to 1250 units/hr and check level in 8 hours.    Wynona Neat, PharmD, BCPS  05/22/2021,4:09 AM

## 2021-05-22 NOTE — Progress Notes (Signed)
PROGRESS NOTE    WAVA KILDOW  GGE:[id number] DOB: May 17, 1951 DOA: 05/15/2021 PCP: Marin Olp, MD   Brief Narrative:  70 year old with history of aortic and mitral valve replacement with mechanical valve, A. fib on Coumadin, systolic CHF, CKD stage IIIa, history of breast cancer, aortic insufficiency, Hodgkin's disease, history of TIA came to the ER with complaints of mechanical fall sustaining right femoral neck fracture confirmed on the CT scan.  Denies any loss of consciousness.  Orthopedic was consulted.  Chest x-ray showed mild vascular congestion.  Patient has been started on heparin drip, Coumadin has been held.  Seen by cardiology, repeat echo shows dilated cardiomyopathy with EF 55 to 60%, severe TR.   Assessment & Plan:   Principal Problem:   Closed right femoral fracture (HCC) Active Problems:   Hypothyroidism   Hyperlipidemia   Congestive heart failure (HCC)   History of cardiovascular disorder   CKD (chronic kidney disease), stage III (HCC)   Atrial flutter (HCC)   Hx of long-term (current) use of anticoagulants   CAD (coronary artery disease)   Mechanical fall causing displaced right femoral neck fracture - Initially elevated INR, now it is drifted down to 1.1..  Currently on heparin drip, can be stopped prior to surgery depending on the schedule. Orthopedic is following.  Pain control, bowel regimen.  Postop will need PT/OT. Patient will be transferred to [facility] for surgery tomorrow by Dr. Lyla Glassing n.p.o. past midnight.  Cardiology team will continue to follow the patient.  Heparin drip should be held at 6 AM.  Given extensive cardiac history she is intermediate to high risk but necessary surgery to gain her mobility and control her pain.  Nausea and vomiting - Resolved  History of mechanical valve replacement, aortic and mitral Severe tricuspid regurgitation - Performed at [facility] in 2012 per the patient.  Goal INR 2.5-3.5.  Coumadin is  currently on hold.  INR today 1.1.  Currently on heparin drip.  Should resume aspirin right after surgery when appropriate  Hypothyroidism - Synthroid  Mild CHF congestive heart failure reduced ejection fraction, EF 55% - Last echo I can find in epic under care everywhere is in January 2019 showing EF of 35%.  Mechanical prosthetic aortic and mitral valve noted -Repeat echocardiogram shows EF of 55%, dilated cardiomyopathy, replaced valve noted.  Severe TR. - Home meds Lasix, Toprol-XL, Aldactone.  Received a dose of Lasix yesterday, will give additional dose of Lasix today.  CKD stage II - Creatinine is stable today at 0.9  History of atrial fibrillation with flutter - On Coumadin  Coronary artery disease - Chest pain-free  History of constrictive pericarditis - Status post pericardiectomy  Hodgkin's lymphoma History of breast cancer status post bilateral mastectomy and reconstruction - Status post radiation and chemo.  Dr. Alen Blew outpatient  Iron deficiency anemia - Follows outpatient Dr. Alen Blew  Chronic venous insufficiency - Follows outpatient vascular, Dr. Doren Custard  DVT prophylaxis: Heparin drip Code Status: Full code Family Communication:    Status is: Inpatient  Remains inpatient appropriate because: Plans for ORIF tomorrow at [facility], patient to be transferred there today..  Continue heparin drip due to mechanical valves  Subjective: Seen and examined at bedside, does have some orthopnea and dyspnea.  Elevated JVD.  Examination: Constitutional: Not in acute distress Respiratory: Some bibasilar crackles Cardiovascular: Normal sinus rhythm, no rubs Abdomen: Nontender nondistended good bowel sounds Musculoskeletal: No edema noted Skin: No rashes seen Neurologic: CN 2-12 grossly intact.  And nonfocal  Psychiatric: Normal judgment and insight. Alert and oriented x 3. Normal mood.  Objective: Vitals:   05/21/21 0757 05/21/21 0759 05/21/21 1948 05/22/21 0440   BP: 128/68  106/60 (!) 117/59  Pulse: (!) 108 94 98   Resp: 19  20 20   Temp: 98.2 F (36.8 C)  97.8 F (36.6 C) 98.7 F (37.1 C)  TempSrc: Oral  Oral Oral  SpO2: 95%  97%   Weight:      Height:    5' 3.5" (1.613 m)    Intake/Output Summary (Last 24 hours) at 05/22/2021 0813 Last data filed at 05/22/2021 0056 Gross per 24 hour  Intake 388.83 ml  Output --  Net 388.83 ml   Filed Weights   05/20/21 1122  Weight: 62.7 kg     Data Reviewed:   CBC: Recent Labs  Lab 05/07/2021 2147 05/20/21 0836 05/21/21 0432 05/22/21 0258  WBC 13.8* 21.1* 17.7* 16.4*  NEUTROABS 10.9*  --   --   --   HGB 11.7* 11.1* 10.7* 10.2*  HCT 38.2 35.3* 33.8* 32.5*  MCV 92.0 90.3 90.1 89.5  PLT 501* 455* 400 876   Basic Metabolic Panel: Recent Labs  Lab 05/10/2021 2147 05/20/21 0836 05/21/21 0432 05/22/21 0258  NA 136 134* 132* 132*  K 4.0 4.3 4.6 4.0  CL 102 102 100 99  CO2 27 26 21* 22  GLUCOSE 95 172* 110* 102*  BUN 22 19 16 15   CREATININE 1.04* 0.97 0.94 0.99  CALCIUM 8.3* 8.1* 7.7* 7.5*  MG  --   --  2.1 2.0   GFR: Estimated Creatinine Clearance: 44.7 mL/min (by C-G formula based on SCr of 0.99 mg/dL). Liver Function Tests: Recent Labs  Lab 05/04/2021 2147 05/20/21 0836  AST 31 30  ALT 21 21  ALKPHOS 72 70  BILITOT 0.5 0.7  PROT 7.0 6.7  ALBUMIN 3.7 3.4*   No results for input(s): LIPASE, AMYLASE in the last 168 hours. No results for input(s): AMMONIA in the last 168 hours. Coagulation Profile: Recent Labs  Lab 04/27/2021 2147 05/20/21 1107 05/21/21 0432 05/22/21 0258  INR 2.9* 3.1* 2.4* 1.1   Cardiac Enzymes: No results for input(s): CKTOTAL, CKMB, CKMBINDEX, TROPONINI in the last 168 hours. BNP (last 3 results) No results for input(s): PROBNP in the last 8760 hours. HbA1C: No results for input(s): HGBA1C in the last 72 hours. CBG: No results for input(s): GLUCAP in the last 168 hours. Lipid Profile: No results for input(s): CHOL, HDL, LDLCALC, TRIG,  CHOLHDL, LDLDIRECT in the last 72 hours. Thyroid Function Tests: No results for input(s): TSH, T4TOTAL, FREET4, T3FREE, THYROIDAB in the last 72 hours. Anemia Panel: No results for input(s): VITAMINB12, FOLATE, FERRITIN, TIBC, IRON, RETICCTPCT in the last 72 hours. Sepsis Labs: No results for input(s): PROCALCITON, LATICACIDVEN in the last 168 hours.  Recent Results (from the past 240 hour(s))  Resp Panel by RT-PCR (Flu A&B, Covid) Nasopharyngeal Swab     Status: None   Collection Time: 05/20/21 12:02 AM   Specimen: Nasopharyngeal Swab; Nasopharyngeal(NP) swabs in vial transport medium  Result Value Ref Range Status   SARS Coronavirus 2 by RT PCR NEGATIVE NEGATIVE Final    Comment: (NOTE) SARS-CoV-2 target nucleic acids are NOT DETECTED.  The SARS-CoV-2 RNA is generally detectable in upper respiratory specimens during the acute phase of infection. The lowest concentration of SARS-CoV-2 viral copies this assay can detect is 138 copies/mL. A negative result does not preclude SARS-Cov-2 infection and should not be used as the  sole basis for treatment or other patient management decisions. A negative result may occur with  improper specimen collection/handling, submission of specimen other than nasopharyngeal swab, presence of viral mutation(s) within the areas targeted by this assay, and inadequate number of viral copies(<138 copies/mL). A negative result must be combined with clinical observations, patient history, and epidemiological information. The expected result is Negative.  Fact Sheet for Patients:  EntrepreneurPulse.com.au  Fact Sheet for Healthcare Providers:  IncredibleEmployment.be  This test is no t yet approved or cleared by the [place] FDA and  has been authorized for detection and/or diagnosis of SARS-CoV-2 by FDA under an Emergency Use Authorization (EUA). This EUA will remain  in effect (meaning this test can be used) for  the duration of the COVID-19 declaration under Section 564(b)(1) of the Act, 21 U.S.C.section 360bbb-3(b)(1), unless the authorization is terminated  or revoked sooner.       Influenza A by PCR NEGATIVE NEGATIVE Final   Influenza B by PCR NEGATIVE NEGATIVE Final    Comment: (NOTE) The Xpert Xpress SARS-CoV-2/FLU/RSV plus assay is intended as an aid in the diagnosis of influenza from Nasopharyngeal swab specimens and should not be used as a sole basis for treatment. Nasal washings and aspirates are unacceptable for Xpert Xpress SARS-CoV-2/FLU/RSV testing.  Fact Sheet for Patients: EntrepreneurPulse.com.au  Fact Sheet for Healthcare Providers: IncredibleEmployment.be  This test is not yet approved or cleared by the [place] FDA and has been authorized for detection and/or diagnosis of SARS-CoV-2 by FDA under an Emergency Use Authorization (EUA). This EUA will remain in effect (meaning this test can be used) for the duration of the COVID-19 declaration under Section 564(b)(1) of the Act, 21 U.S.C. section 360bbb-3(b)(1), unless the authorization is terminated or revoked.  Performed at [facility], [place] [address] [place], [place] [address]          Radiology Studies: ECHOCARDIOGRAM COMPLETE  Result Date: 05/20/2021    ECHOCARDIOGRAM REPORT   Patient Name:   GRACEMARIE SKEET Date of Exam: 05/20/2021 Medical Rec #:  [id number]        Height:       63.5 in Accession #:    [id number]       Weight:       138.2 lb Date of Birth:  Oct 15, 1950        BSA:          1.662 m Patient Age:    28 years         BP:           102/65 mmHg Patient Gender: F                HR:           93 bpm. Exam Location:  Inpatient Procedure: 2D Echo, Cardiac Doppler and Color Doppler Indications:    Cardiomyopathy-Unspecified I42.9  History:        Patient has prior history of Echocardiogram examinations, most                 recent 07/31/2006. CAD, TIA and  Stroke, Arrythmias:Atrial                 Fibrillation and Atrial Flutter; Risk Factors:Dyslipidemia. CKD                 (chronic kidney disease), stage III, Hx of long-term (current)                 use of  anticoagulants, Breast cancer with bilateral implants,                 H/O aortic valve replacement and mitral valve replacement, WPW                 syndrome.  Sonographer:    Alvino Chapel RCS Referring Phys: [id number] [facility] Emmily Pellegrin  Sonographer Comments: Technically difficult study due to poor echo windows. Image acquisition challenging due to breast implants. IMPRESSIONS  1. Left ventricular ejection fraction, by estimation, is 55 to 60%. The left ventricle has normal function. Left ventricular endocardial border not optimally defined to evaluate regional wall motion. Left ventricular diastolic parameters are indeterminate.  2. Right ventricular systolic function is normal. The right ventricular size is mildly enlarged.  3. Right atrial size was moderately dilated.  4. The mitral valve has been repaired/replaced. No evidence of mitral valve regurgitation. No evidence of mitral stenosis.  5. Tricuspid valve regurgitation is severe.  6. The aortic valve was not well visualized. Aortic valve regurgitation is not visualized. No aortic stenosis is present. Aortic valve mean gradient measures 7.0 mmHg. Aortic valve Vmax measures 1.79 m/s.  7. The inferior vena cava is normal in size with greater than 50% respiratory variability, suggesting right atrial pressure of 3 mmHg.  8. Very limited echo due to poor sound wave transmission. FINDINGS  Left Ventricle: Left ventricular ejection fraction, by estimation, is 55 to 60%. The left ventricle has normal function. Left ventricular endocardial border not optimally defined to evaluate regional wall motion. The left ventricular internal cavity size was normal in size. There is no left ventricular hypertrophy. Left ventricular diastolic parameters are indeterminate. Right  Ventricle: The right ventricular size is mildly enlarged. No increase in right ventricular wall thickness. Right ventricular systolic function is normal. Left Atrium: Left atrial size was not well visualized. Right Atrium: Right atrial size was moderately dilated. Pericardium: There is no evidence of pericardial effusion. Mitral Valve: The mitral valve has been repaired/replaced. No evidence of mitral valve regurgitation. There is a mechanical valve present in the mitral position. No evidence of mitral valve stenosis. MV peak gradient, 10.0 mmHg. The mean mitral valve gradient is 4.0 mmHg. Tricuspid Valve: The tricuspid valve is grossly normal. Tricuspid valve regurgitation is severe. No evidence of tricuspid stenosis. Aortic Valve: The aortic valve was not well visualized. Aortic valve regurgitation is not visualized. No aortic stenosis is present. Aortic valve mean gradient measures 7.0 mmHg. Aortic valve peak gradient measures 12.8 mmHg. There is a mechanical valve present in the aortic position. Pulmonic Valve: The pulmonic valve was not well visualized. Pulmonic valve regurgitation is not visualized. No evidence of pulmonic stenosis. Aorta: The aortic root is normal in size and structure. Venous: The inferior vena cava is normal in size with greater than 50% respiratory variability, suggesting right atrial pressure of 3 mmHg. IAS/Shunts: No atrial level shunt detected by color flow Doppler.   Diastology LV e' medial:    4.90 cm/s LV E/e' medial:  29.2 LV e' lateral:   8.59 cm/s LV E/e' lateral: 16.6  RIGHT VENTRICLE RV S prime:     6.31 cm/s TAPSE (M-mode): 1.0 cm LEFT ATRIUM           Index        RIGHT ATRIUM           Index LA Vol (A4C): 62.5 ml 37.60 ml/m  RA Area:     17.10 cm  RA Volume:   48.40 ml  29.11 ml/m  AORTIC VALVE AV Vmax:           179.00 cm/s AV Vmean:          115.000 cm/s AV VTI:            0.282 m AV Peak Grad:      12.8 mmHg AV Mean Grad:      7.0 mmHg  LVOT Vmax:         95.60 cm/s LVOT Vmean:        59.200 cm/s LVOT VTI:          0.149 m LVOT/AV VTI ratio: 0.53 MITRAL VALVE                TRICUSPID VALVE MV Area (PHT): 5.27 cm     TR Peak grad:   33.9 mmHg MV Peak grad:  10.0 mmHg    TR Vmax:        291.00 cm/s MV Mean grad:  4.0 mmHg MV Vmax:       1.58 m/s     SHUNTS MV Vmean:      89.2 cm/s    Systemic VTI: 0.15 m MV Decel Time: 144 msec MV E velocity: 143.00 cm/s Glori Bickers MD Electronically signed by Glori Bickers MD Signature Date/Time: 05/20/2021/4:17:43 PM    Final         Scheduled Meds:  cholecalciferol  1,000 Units Oral Daily   docusate sodium  100 mg Oral QPM   ezetimibe  10 mg Oral QPM   levothyroxine  100 mcg Oral Q0600   metoprolol succinate  50 mg Oral BID   pantoprazole  40 mg Oral Daily   Continuous Infusions:  heparin 1,250 Units/hr (05/22/21 0443)     LOS: 3 days   Time spent= 35 mins    Imad Shostak Arsenio Loader, MD [facility]  If 7PM-7AM, please contact night-coverage  05/22/2021, 8:13 AM

## 2021-05-22 NOTE — Progress Notes (Signed)
Asked by Dr. Mable Fill to treat R hip fx. Plan for R THA tomorrow. NPO after MN. Stop heparin gtt at 0600 tomorrow am.

## 2021-05-22 NOTE — Progress Notes (Signed)
PT Cancellation Note  Patient Details Name: Grace [name] MRN: [id number] DOB: 04-17-1951   Cancelled Treatment:    Reason Eval/Treat Not Completed: Patient not medically ready.  Will follow along as pt awaits her surgery.   Ramond Dial 05/22/2021, 10:17 AM  Mee Hives, PT PhD Acute Rehab Dept. Number: [place] and [place]

## 2021-05-22 NOTE — Progress Notes (Signed)
Progress Note  Patient Name: Grace [name] Date of Encounter: 05/22/2021  [facility] Cardiologist:  [facility]   Subjective    70 yo with mechanical AVR, MVR ( due to mantle  radiation)  Here with hip fracture INR is now normal, warfarin is on hold  On heparin drip   Inpatient Medications    Scheduled Meds:  cholecalciferol  1,000 Units Oral Daily   docusate sodium  100 mg Oral QPM   ezetimibe  10 mg Oral QPM   levothyroxine  100 mcg Oral Q0600   metoprolol succinate  50 mg Oral BID   pantoprazole  40 mg Oral Daily   Continuous Infusions:  heparin     PRN Meds: acetaminophen, dextromethorphan-guaiFENesin, hydrALAZINE, ipratropium-albuterol, metoCLOPramide (REGLAN) injection, metoprolol tartrate, morphine injection, ondansetron **OR** ondansetron (ZOFRAN) IV, oxyCODONE, senna-docusate, traZODone   Vital Signs    Vitals:   05/21/21 0757 05/21/21 0759 05/21/21 1948 05/22/21 0440  BP: 128/68  106/60 (!) 117/59  Pulse: (!) 108 94 98   Resp: 19  20 20   Temp: 98.2 F (36.8 C)  97.8 F (36.6 C) 98.7 F (37.1 C)  TempSrc: Oral  Oral Oral  SpO2: 95%  97%   Weight:      Height:    5' 3.5" (1.613 m)    Intake/Output Summary (Last 24 hours) at 05/22/2021 0843 Last data filed at 05/22/2021 0056 Gross per 24 hour  Intake 388.83 ml  Output --  Net 388.83 ml   Last 3 Weights 05/20/2021 04/30/2021 04/04/2021  Weight (lbs) 138 lb 3.7 oz 132 lb 8 oz 130 lb 9.6 oz  Weight (kg) 62.7 kg 60.102 kg 59.24 kg      Telemetry    Nsr - Personally Reviewed  ECG     - Personally Reviewed  Physical Exam   GEN: elderly female , NAD    Neck: No JVD Cardiac: RRR, mechanical Z1, S2 Respiratory: Clear to auscultation bilaterally. GI: Soft, nontender, non-distended  MS: No edema; No deformity. Neuro:  Nonfocal  Psych: Normal affect   Labs    High Sensitivity Troponin:  No results for input(s): TROPONINIHS in the last 720 hours.   Chemistry Recent  Labs  Lab 05/12/2021 2147 05/20/21 0836 05/21/21 0432 05/22/21 0258  NA 136 134* 132* 132*  K 4.0 4.3 4.6 4.0  CL 102 102 100 99  CO2 27 26 21* 22  GLUCOSE 95 172* 110* 102*  BUN 22 19 16 15   CREATININE 1.04* 0.97 0.94 0.99  CALCIUM 8.3* 8.1* 7.7* 7.5*  MG  --   --  2.1 2.0  PROT 7.0 6.7  --   --   ALBUMIN 3.7 3.4*  --   --   AST 31 30  --   --   ALT 21 21  --   --   ALKPHOS 72 70  --   --   BILITOT 0.5 0.7  --   --   GFRNONAA 58* >60 >60 >60  ANIONGAP 7 6 11 11     Lipids No results for input(s): CHOL, TRIG, HDL, LABVLDL, LDLCALC, CHOLHDL in the last 168 hours.  Hematology Recent Labs  Lab 05/20/21 0836 05/21/21 0432 05/22/21 0258  WBC 21.1* 17.7* 16.4*  RBC 3.91 3.75* 3.63*  HGB 11.1* 10.7* 10.2*  HCT 35.3* 33.8* 32.5*  MCV 90.3 90.1 89.5  MCH 28.4 28.5 28.1  MCHC 31.4 31.7 31.4  RDW 15.9* 15.9* 15.6*  PLT 455* 400 387   Thyroid  No results for input(s): TSH, FREET4 in the last 168 hours.  BNPNo results for input(s): BNP, PROBNP in the last 168 hours.  DDimer No results for input(s): DDIMER in the last 168 hours.   Radiology    ECHOCARDIOGRAM COMPLETE  Result Date: 05/20/2021    ECHOCARDIOGRAM REPORT   Patient Name:   SHAKORA NORDQUIST Date of Exam: 05/20/2021 Medical Rec #:  [id number]        Height:       63.5 in Accession #:    [id number]       Weight:       138.2 lb Date of Birth:  [birth year]        BSA:          1.662 m Patient Age:    34 years         BP:           102/65 mmHg Patient Gender: F                HR:           93 bpm. Exam Location:  Inpatient Procedure: 2D Echo, Cardiac Doppler and Color Doppler Indications:    Cardiomyopathy-Unspecified I42.9  History:        Patient has prior history of Echocardiogram examinations, most                 recent 07/31/2006. CAD, TIA and Stroke, Arrythmias:Atrial                 Fibrillation and Atrial Flutter; Risk Factors:Dyslipidemia. CKD                 (chronic kidney disease), stage III, Hx of long-term (current)                  use of anticoagulants, Breast cancer with bilateral implants,                 H/O aortic valve replacement and mitral valve replacement, WPW                 syndrome.  Sonographer:    Alvino Chapel RCS Referring Phys: [id number] [facility]  Sonographer Comments: Technically difficult study due to poor echo windows. Image acquisition challenging due to breast implants. IMPRESSIONS  1. Left ventricular ejection fraction, by estimation, is 55 to 60%. The left ventricle has normal function. Left ventricular endocardial border not optimally defined to evaluate regional wall motion. Left ventricular diastolic parameters are indeterminate.  2. Right ventricular systolic function is normal. The right ventricular size is mildly enlarged.  3. Right atrial size was moderately dilated.  4. The mitral valve has been repaired/replaced. No evidence of mitral valve regurgitation. No evidence of mitral stenosis.  5. Tricuspid valve regurgitation is severe.  6. The aortic valve was not well visualized. Aortic valve regurgitation is not visualized. No aortic stenosis is present. Aortic valve mean gradient measures 7.0 mmHg. Aortic valve Vmax measures 1.79 m/s.  7. The inferior vena cava is normal in size with greater than 50% respiratory variability, suggesting right atrial pressure of 3 mmHg.  8. Very limited echo due to poor sound wave transmission. FINDINGS  Left Ventricle: Left ventricular ejection fraction, by estimation, is 55 to 60%. The left ventricle has normal function. Left ventricular endocardial border not optimally defined to evaluate regional wall motion. The left ventricular internal cavity size was normal in size. There is no left ventricular hypertrophy. Left ventricular diastolic parameters  are indeterminate. Right Ventricle: The right ventricular size is mildly enlarged. No increase in right ventricular wall thickness. Right ventricular systolic function is normal. Left Atrium: Left atrial size was  not well visualized. Right Atrium: Right atrial size was moderately dilated. Pericardium: There is no evidence of pericardial effusion. Mitral Valve: The mitral valve has been repaired/replaced. No evidence of mitral valve regurgitation. There is a mechanical valve present in the mitral position. No evidence of mitral valve stenosis. MV peak gradient, 10.0 mmHg. The mean mitral valve gradient is 4.0 mmHg. Tricuspid Valve: The tricuspid valve is grossly normal. Tricuspid valve regurgitation is severe. No evidence of tricuspid stenosis. Aortic Valve: The aortic valve was not well visualized. Aortic valve regurgitation is not visualized. No aortic stenosis is present. Aortic valve mean gradient measures 7.0 mmHg. Aortic valve peak gradient measures 12.8 mmHg. There is a mechanical valve present in the aortic position. Pulmonic Valve: The pulmonic valve was not well visualized. Pulmonic valve regurgitation is not visualized. No evidence of pulmonic stenosis. Aorta: The aortic root is normal in size and structure. Venous: The inferior vena cava is normal in size with greater than 50% respiratory variability, suggesting right atrial pressure of 3 mmHg. IAS/Shunts: No atrial level shunt detected by color flow Doppler.   Diastology LV e' medial:    4.90 cm/s LV E/e' medial:  29.2 LV e' lateral:   8.59 cm/s LV E/e' lateral: 16.6  RIGHT VENTRICLE RV S prime:     6.31 cm/s TAPSE (M-mode): 1.0 cm LEFT ATRIUM           Index        RIGHT ATRIUM           Index LA Vol (A4C): 62.5 ml 37.60 ml/m  RA Area:     17.10 cm                                    RA Volume:   48.40 ml  29.11 ml/m  AORTIC VALVE AV Vmax:           179.00 cm/s AV Vmean:          115.000 cm/s AV VTI:            0.282 m AV Peak Grad:      12.8 mmHg AV Mean Grad:      7.0 mmHg LVOT Vmax:         95.60 cm/s LVOT Vmean:        59.200 cm/s LVOT VTI:          0.149 m LVOT/AV VTI ratio: 0.53 MITRAL VALVE                TRICUSPID VALVE MV Area (PHT): 5.27 cm     TR  Peak grad:   33.9 mmHg MV Peak grad:  10.0 mmHg    TR Vmax:        291.00 cm/s MV Mean grad:  4.0 mmHg MV Vmax:       1.58 m/s     SHUNTS MV Vmean:      89.2 cm/s    Systemic VTI: 0.15 m MV Decel Time: 144 msec MV E velocity: 143.00 cm/s Glori Bickers MD Electronically signed by Glori Bickers MD Signature Date/Time: 05/20/2021/4:17:43 PM    Final     Cardiac Studies      Patient Profile     70 y.o. female  Assessment & Plan     1.  Pre op eval - prior to hip surgery  She has been given the OK to proceed with surgery. Plan on restarting ASA following surgery  Restart spiro - ? Tomorrow  Will restart metoprolol today .      For questions or updates, please contact [place] Please consult [email or web address] for contact info under        Signed, Mertie Moores, MD  05/22/2021, 8:43 AM

## 2021-05-23 ENCOUNTER — Encounter (HOSPITAL_COMMUNITY): Admission: EM | Disposition: E | Payer: Self-pay | Source: Home / Self Care | Attending: Internal Medicine

## 2021-05-23 ENCOUNTER — Inpatient Hospital Stay (HOSPITAL_COMMUNITY): Payer: Medicare PPO | Admitting: Anesthesiology

## 2021-05-23 ENCOUNTER — Inpatient Hospital Stay (HOSPITAL_COMMUNITY): Payer: Medicare PPO

## 2021-05-23 ENCOUNTER — Encounter (HOSPITAL_COMMUNITY): Payer: Self-pay | Admitting: Internal Medicine

## 2021-05-23 DIAGNOSIS — N183 Chronic kidney disease, stage 3 unspecified: Secondary | ICD-10-CM | POA: Diagnosis not present

## 2021-05-23 DIAGNOSIS — I483 Typical atrial flutter: Secondary | ICD-10-CM | POA: Diagnosis not present

## 2021-05-23 DIAGNOSIS — Z8679 Personal history of other diseases of the circulatory system: Secondary | ICD-10-CM

## 2021-05-23 DIAGNOSIS — I2510 Atherosclerotic heart disease of native coronary artery without angina pectoris: Secondary | ICD-10-CM | POA: Diagnosis not present

## 2021-05-23 DIAGNOSIS — S72001A Fracture of unspecified part of neck of right femur, initial encounter for closed fracture: Secondary | ICD-10-CM | POA: Diagnosis not present

## 2021-05-23 HISTORY — PX: TOTAL HIP ARTHROPLASTY: SHX124

## 2021-05-23 LAB — BASIC METABOLIC PANEL
Anion gap: 9 (ref 5–15)
BUN: 15 mg/dL (ref 8–23)
CO2: 25 mmol/L (ref 22–32)
Calcium: 7.5 mg/dL — ABNORMAL LOW (ref 8.9–10.3)
Chloride: 96 mmol/L — ABNORMAL LOW (ref 98–111)
Creatinine, Ser: 0.71 mg/dL (ref 0.44–1.00)
GFR, Estimated: >60 mL/min (ref >60)
Glucose, Bld: 105 mg/dL — ABNORMAL HIGH (ref 70–99)
Potassium: 3.8 mmol/L (ref 3.5–5.1)
Sodium: 130 mmol/L — ABNORMAL LOW (ref 135–145)

## 2021-05-23 LAB — TYPE AND SCREEN
ABO/RH(D): O POS
Antibody Screen: NEGATIVE

## 2021-05-23 LAB — CBC
HCT: 33.8 % — ABNORMAL LOW (ref 36.0–46.0)
Hemoglobin: 10.8 g/dL — ABNORMAL LOW (ref 12.0–15.0)
MCH: 28 pg (ref 26.0–34.0)
MCHC: 32 g/dL (ref 30.0–36.0)
MCV: 87.6 fL (ref 80.0–100.0)
Platelets: 376 10*3/uL (ref 150–400)
RBC: 3.86 MIL/uL — ABNORMAL LOW (ref 3.87–5.11)
RDW: 15.6 % — ABNORMAL HIGH (ref 11.5–15.5)
WBC: 14.8 10*3/uL — ABNORMAL HIGH (ref 4.0–10.5)
nRBC: 0.1 % (ref 0.0–0.2)

## 2021-05-23 LAB — SURGICAL PCR SCREEN
MRSA, PCR: NEGATIVE
Staphylococcus aureus: NEGATIVE

## 2021-05-23 LAB — PROTIME-INR
INR: 1.1 (ref 0.8–1.2)
Prothrombin Time: 14.5 seconds (ref 11.4–15.2)

## 2021-05-23 LAB — MAGNESIUM: Magnesium: 2.3 mg/dL (ref 1.7–2.4)

## 2021-05-23 SURGERY — ARTHROPLASTY, HIP, TOTAL, ANTERIOR APPROACH
Anesthesia: General | Site: Hip | Laterality: Right

## 2021-05-23 MED ORDER — BUPIVACAINE-EPINEPHRINE (PF) 0.25% -1:200000 IJ SOLN
INTRAMUSCULAR | Status: AC
  Filled 2021-05-23: qty 30

## 2021-05-23 MED ORDER — DEXAMETHASONE SODIUM PHOSPHATE 10 MG/ML IJ SOLN
INTRAMUSCULAR | Status: AC
  Filled 2021-05-23: qty 1

## 2021-05-23 MED ORDER — MENTHOL 3 MG MT LOZG: 1.0000 | LOZENGE | OROMUCOSAL | Status: DC | PRN

## 2021-05-23 MED ORDER — LIDOCAINE 2% (20 MG/ML) 5 ML SYRINGE
INTRAMUSCULAR | Status: DC | PRN
  Administered 2021-05-23: 40 mg via INTRAVENOUS

## 2021-05-23 MED ORDER — POVIDONE-IODINE 10 % EX SWAB
2.0000 "application " | Freq: Once | CUTANEOUS | Status: AC
  Administered 2021-05-23: 2 via TOPICAL

## 2021-05-23 MED ORDER — PHENYLEPHRINE HCL-NACL 20-0.9 MG/250ML-% IV SOLN
INTRAVENOUS | Status: DC | PRN
  Administered 2021-05-23: 30 ug/min via INTRAVENOUS

## 2021-05-23 MED ORDER — CHLORHEXIDINE GLUCONATE 0.12 % MT SOLN
15.0000 mL | Freq: Once | OROMUCOSAL | Status: AC
  Administered 2021-05-23: 12:00:00 15 mL via OROMUCOSAL

## 2021-05-23 MED ORDER — ONDANSETRON HCL 4 MG/2ML IJ SOLN
4.0000 mg | Freq: Four times a day (QID) | INTRAMUSCULAR | Status: DC | PRN
  Administered 2021-05-28: 4 mg via INTRAVENOUS
  Filled 2021-05-23: qty 2

## 2021-05-23 MED ORDER — SENNA 8.6 MG PO TABS
1.0000 | ORAL_TABLET | Freq: Two times a day (BID) | ORAL | Status: DC
  Administered 2021-05-23 – 2021-05-28 (×9): 8.6 mg via ORAL
  Filled 2021-05-23 (×11): qty 1

## 2021-05-23 MED ORDER — PHENYLEPHRINE HCL (PRESSORS) 10 MG/ML IV SOLN
INTRAVENOUS | Status: AC
  Filled 2021-05-23: qty 1

## 2021-05-23 MED ORDER — MUPIROCIN 2 % EX OINT
1.0000 "application " | TOPICAL_OINTMENT | Freq: Two times a day (BID) | CUTANEOUS | Status: DC
  Administered 2021-05-23 (×2): 1 via NASAL
  Filled 2021-05-23: qty 22

## 2021-05-23 MED ORDER — TRANEXAMIC ACID-NACL 1000-0.7 MG/100ML-% IV SOLN
1000.0000 mg | Freq: Once | INTRAVENOUS | Status: AC
  Administered 2021-05-23: 22:00:00 1000 mg via INTRAVENOUS
  Filled 2021-05-23: qty 100

## 2021-05-23 MED ORDER — ESMOLOL HCL 100 MG/10ML IV SOLN
INTRAVENOUS | Status: DC | PRN
  Administered 2021-05-23: 20 mg via INTRAVENOUS

## 2021-05-23 MED ORDER — ROCURONIUM BROMIDE 10 MG/ML (PF) SYRINGE
PREFILLED_SYRINGE | INTRAVENOUS | Status: DC | PRN
  Administered 2021-05-23 (×2): 10 mg via INTRAVENOUS
  Administered 2021-05-23: 30 mg via INTRAVENOUS

## 2021-05-23 MED ORDER — DEXAMETHASONE SODIUM PHOSPHATE 10 MG/ML IJ SOLN
INTRAMUSCULAR | Status: DC | PRN
  Administered 2021-05-23: 6 mg via INTRAVENOUS

## 2021-05-23 MED ORDER — FENTANYL CITRATE PF 50 MCG/ML IJ SOSY
25.0000 ug | PREFILLED_SYRINGE | INTRAMUSCULAR | Status: DC | PRN
  Administered 2021-05-23 (×2): 25 ug via INTRAVENOUS

## 2021-05-23 MED ORDER — SUGAMMADEX SODIUM 200 MG/2ML IV SOLN
INTRAVENOUS | Status: DC | PRN
  Administered 2021-05-23: 150 mg via INTRAVENOUS

## 2021-05-23 MED ORDER — SODIUM CHLORIDE (PF) 0.9 % IJ SOLN
INTRAMUSCULAR | Status: AC
  Filled 2021-05-23: qty 10

## 2021-05-23 MED ORDER — LACTATED RINGERS IV SOLN: INTRAVENOUS | Status: DC | PRN

## 2021-05-23 MED ORDER — FENTANYL CITRATE (PF) 100 MCG/2ML IJ SOLN
INTRAMUSCULAR | Status: DC | PRN
  Administered 2021-05-23 (×3): 25 ug via INTRAVENOUS
  Administered 2021-05-23: 100 ug via INTRAVENOUS
  Administered 2021-05-23: 25 ug via INTRAVENOUS

## 2021-05-23 MED ORDER — POVIDONE-IODINE 10 % EX SWAB: 2.0000 "application " | Freq: Once | CUTANEOUS | Status: AC

## 2021-05-23 MED ORDER — METOCLOPRAMIDE HCL 5 MG PO TABS: 5.0000 mg | ORAL_TABLET | Freq: Three times a day (TID) | ORAL | Status: DC | PRN

## 2021-05-23 MED ORDER — DOCUSATE SODIUM 100 MG PO CAPS
100.0000 mg | ORAL_CAPSULE | Freq: Two times a day (BID) | ORAL | Status: DC
  Administered 2021-05-23 – 2021-05-28 (×9): 100 mg via ORAL
  Filled 2021-05-23 (×11): qty 1

## 2021-05-23 MED ORDER — PHENYLEPHRINE 40 MCG/ML (10ML) SYRINGE FOR IV PUSH (FOR BLOOD PRESSURE SUPPORT)
PREFILLED_SYRINGE | INTRAVENOUS | Status: DC | PRN
  Administered 2021-05-23 (×2): 80 ug via INTRAVENOUS

## 2021-05-23 MED ORDER — WARFARIN SODIUM 6 MG PO TABS
6.0000 mg | ORAL_TABLET | Freq: Once | ORAL | Status: AC
  Administered 2021-05-23: 20:00:00 6 mg via ORAL
  Filled 2021-05-23: qty 1

## 2021-05-23 MED ORDER — 0.9 % SODIUM CHLORIDE (POUR BTL) OPTIME
TOPICAL | Status: DC | PRN
  Administered 2021-05-23 (×2): 1000 mL

## 2021-05-23 MED ORDER — SODIUM CHLORIDE 0.9 % IR SOLN
Status: DC | PRN
  Administered 2021-05-23: 1000 mL

## 2021-05-23 MED ORDER — ONDANSETRON HCL 4 MG/2ML IJ SOLN
INTRAMUSCULAR | Status: DC | PRN
  Administered 2021-05-23 (×2): 4 mg via INTRAVENOUS

## 2021-05-23 MED ORDER — CHLORHEXIDINE GLUCONATE 4 % EX LIQD
60.0000 mL | Freq: Once | CUTANEOUS | Status: AC
  Administered 2021-05-23: 4 via TOPICAL
  Filled 2021-05-23: qty 60

## 2021-05-23 MED ORDER — ONDANSETRON HCL 4 MG/2ML IJ SOLN
INTRAMUSCULAR | Status: AC
  Filled 2021-05-23: qty 2

## 2021-05-23 MED ORDER — ONDANSETRON HCL 4 MG/2ML IJ SOLN: 4.0000 mg | Freq: Once | INTRAMUSCULAR | Status: DC | PRN

## 2021-05-23 MED ORDER — FENTANYL CITRATE (PF) 100 MCG/2ML IJ SOLN
INTRAMUSCULAR | Status: AC
  Filled 2021-05-23: qty 2

## 2021-05-23 MED ORDER — PHENOL 1.4 % MT LIQD: 1.0000 | OROMUCOSAL | Status: DC | PRN

## 2021-05-23 MED ORDER — SODIUM CHLORIDE (PF) 0.9 % IJ SOLN
INTRAMUSCULAR | Status: DC | PRN
  Administered 2021-05-23: 30 mL

## 2021-05-23 MED ORDER — AMISULPRIDE (ANTIEMETIC) 5 MG/2ML IV SOLN: 10.0000 mg | Freq: Once | INTRAVENOUS | Status: DC | PRN

## 2021-05-23 MED ORDER — KETOROLAC TROMETHAMINE 30 MG/ML IJ SOLN
INTRAMUSCULAR | Status: AC
  Filled 2021-05-23: qty 1

## 2021-05-23 MED ORDER — PROPOFOL 10 MG/ML IV BOLUS
INTRAVENOUS | Status: AC
  Filled 2021-05-23: qty 20

## 2021-05-23 MED ORDER — ACETAMINOPHEN 10 MG/ML IV SOLN: 1000.0000 mg | Freq: Once | INTRAVENOUS | Status: DC | PRN

## 2021-05-23 MED ORDER — METOCLOPRAMIDE HCL 5 MG/ML IJ SOLN
5.0000 mg | Freq: Three times a day (TID) | INTRAMUSCULAR | Status: DC | PRN
  Administered 2021-05-29: 5 mg via INTRAVENOUS
  Filled 2021-05-23: qty 2

## 2021-05-23 MED ORDER — CEFAZOLIN SODIUM-DEXTROSE 2-4 GM/100ML-% IV SOLN
2.0000 g | Freq: Four times a day (QID) | INTRAVENOUS | Status: AC
  Administered 2021-05-23 – 2021-05-24 (×2): 2 g via INTRAVENOUS
  Filled 2021-05-23 (×2): qty 100

## 2021-05-23 MED ORDER — KETOROLAC TROMETHAMINE 30 MG/ML IJ SOLN
INTRAMUSCULAR | Status: DC | PRN
  Administered 2021-05-23: 30 mg

## 2021-05-23 MED ORDER — PROPOFOL 10 MG/ML IV BOLUS
INTRAVENOUS | Status: DC | PRN
  Administered 2021-05-23: 50 mg via INTRAVENOUS

## 2021-05-23 MED ORDER — CHLORHEXIDINE GLUCONATE CLOTH 2 % EX PADS
6.0000 | MEDICATED_PAD | Freq: Every day | CUTANEOUS | Status: DC
  Administered 2021-05-23 – 2021-05-25 (×3): 6 via TOPICAL

## 2021-05-23 MED ORDER — PROPOFOL 500 MG/50ML IV EMUL
INTRAVENOUS | Status: AC
  Filled 2021-05-23: qty 50

## 2021-05-23 MED ORDER — ESMOLOL HCL 100 MG/10ML IV SOLN
INTRAVENOUS | Status: AC
  Filled 2021-05-23: qty 10

## 2021-05-23 MED ORDER — FUROSEMIDE 10 MG/ML IJ SOLN
40.0000 mg | Freq: Once | INTRAMUSCULAR | Status: AC
  Administered 2021-05-23: 11:00:00 40 mg via INTRAVENOUS
  Filled 2021-05-23: qty 4

## 2021-05-23 MED ORDER — LACTATED RINGERS IV SOLN: INTRAVENOUS | Status: DC

## 2021-05-23 MED ORDER — WARFARIN - PHARMACIST DOSING INPATIENT: Freq: Every day | Status: DC

## 2021-05-23 MED ORDER — PHENYLEPHRINE 40 MCG/ML (10ML) SYRINGE FOR IV PUSH (FOR BLOOD PRESSURE SUPPORT)
PREFILLED_SYRINGE | INTRAVENOUS | Status: AC
  Filled 2021-05-23: qty 10

## 2021-05-23 MED ORDER — CEFAZOLIN SODIUM-DEXTROSE 2-4 GM/100ML-% IV SOLN
2.0000 g | INTRAVENOUS | Status: AC
  Administered 2021-05-23: 15:00:00 2 g via INTRAVENOUS
  Filled 2021-05-23: qty 100

## 2021-05-23 MED ORDER — BUPIVACAINE-EPINEPHRINE 0.25% -1:200000 IJ SOLN
INTRAMUSCULAR | Status: DC | PRN
  Administered 2021-05-23: 30 mL

## 2021-05-23 MED ORDER — FENTANYL CITRATE PF 50 MCG/ML IJ SOSY
PREFILLED_SYRINGE | INTRAMUSCULAR | Status: AC
  Filled 2021-05-23: qty 1

## 2021-05-23 MED ORDER — TRANEXAMIC ACID-NACL 1000-0.7 MG/100ML-% IV SOLN
1000.0000 mg | INTRAVENOUS | Status: AC
  Administered 2021-05-23: 15:00:00 1000 mg via INTRAVENOUS
  Filled 2021-05-23: qty 100

## 2021-05-23 MED ORDER — ONDANSETRON HCL 4 MG PO TABS: 4.0000 mg | ORAL_TABLET | Freq: Four times a day (QID) | ORAL | Status: DC | PRN

## 2021-05-23 SURGICAL SUPPLY — 64 items
ACE SHELL 3H 52 E HIP (Shell) ×2 IMPLANT
ADH SKN CLS APL DERMABOND .7 (GAUZE/BANDAGES/DRESSINGS) ×1
APL PRP STRL LF DISP 70% ISPRP (MISCELLANEOUS) ×1
BAG COUNTER SPONGE SURGICOUNT (BAG) IMPLANT
BAG DECANTER FOR FLEXI CONT (MISCELLANEOUS) IMPLANT
BAG SPEC THK2 15X12 ZIP CLS (MISCELLANEOUS)
BAG SPNG CNTER NS LX DISP (BAG)
BAG ZIPLOCK 12X15 (MISCELLANEOUS) IMPLANT
CHLORAPREP W/TINT 26 (MISCELLANEOUS) ×3 IMPLANT
COVER PERINEAL POST (MISCELLANEOUS) ×3 IMPLANT
COVER SURGICAL LIGHT HANDLE (MISCELLANEOUS) ×3 IMPLANT
DECANTER SPIKE VIAL GLASS SM (MISCELLANEOUS) ×3 IMPLANT
DERMABOND ADVANCED (GAUZE/BANDAGES/DRESSINGS) ×1
DERMABOND ADVANCED .7 DNX12 (GAUZE/BANDAGES/DRESSINGS) ×4 IMPLANT
DRAPE IMP U-DRAPE 54X76 (DRAPES) ×3 IMPLANT
DRAPE SHEET LG 3/4 BI-LAMINATE (DRAPES) ×9 IMPLANT
DRAPE STERI IOBAN 125X83 (DRAPES) ×3 IMPLANT
DRAPE U-SHAPE 47X51 STRL (DRAPES) ×6 IMPLANT
DRESSING AQUACEL AG SP 3.5X10 (GAUZE/BANDAGES/DRESSINGS) IMPLANT
DRSG AQUACEL AG ADV 3.5X10 (GAUZE/BANDAGES/DRESSINGS) ×3 IMPLANT
DRSG AQUACEL AG SP 3.5X10 (GAUZE/BANDAGES/DRESSINGS) ×2
ELECT REM PT RETURN 15FT ADLT (MISCELLANEOUS) ×3 IMPLANT
GAUZE SPONGE 4X4 12PLY STRL (GAUZE/BANDAGES/DRESSINGS) ×3 IMPLANT
GLOVE SRG 8 PF TXTR STRL LF DI (GLOVE) ×2 IMPLANT
GLOVE SURG ENC MOIS LTX SZ8.5 (GLOVE) ×6 IMPLANT
GLOVE SURG ENC TEXT LTX SZ7.5 (GLOVE) ×6 IMPLANT
GLOVE SURG UNDER POLY LF SZ8 (GLOVE) ×2
GLOVE SURG UNDER POLY LF SZ8.5 (GLOVE) ×3 IMPLANT
GOWN SPEC L3 XXLG W/TWL (GOWN DISPOSABLE) ×3 IMPLANT
GOWN STRL REUS W/TWL XL LVL3 (GOWN DISPOSABLE) ×3 IMPLANT
HANDPIECE INTERPULSE COAX TIP (DISPOSABLE) ×2
HEAD CERAMIC BIOLOX 36 T1 STD (Head) ×1 IMPLANT
HOLDER FOLEY CATH W/STRAP (MISCELLANEOUS) ×3 IMPLANT
HOOD PEEL AWAY FLYTE STAYCOOL (MISCELLANEOUS) ×12 IMPLANT
JET LAVAGE IRRISEPT WOUND (IRRIGATION / IRRIGATOR) ×2
KIT TURNOVER KIT A (KITS) IMPLANT
LAVAGE JET IRRISEPT WOUND (IRRIGATION / IRRIGATOR) ×2 IMPLANT
LINER ACE G7 HIGH 36 SZ E (Liner) ×1 IMPLANT
MANIFOLD NEPTUNE II (INSTRUMENTS) ×3 IMPLANT
MARKER SKIN DUAL TIP RULER LAB (MISCELLANEOUS) ×3 IMPLANT
NDL SAFETY ECLIPSE 18X1.5 (NEEDLE) ×2 IMPLANT
NDL SPNL 18GX3.5 QUINCKE PK (NEEDLE) ×2 IMPLANT
NEEDLE HYPO 18GX1.5 SHARP (NEEDLE) ×2
NEEDLE SPNL 18GX3.5 QUINCKE PK (NEEDLE) ×2 IMPLANT
PACK ANTERIOR HIP CUSTOM (KITS) ×3 IMPLANT
PENCIL SMOKE EVACUATOR (MISCELLANEOUS) IMPLANT
SAW OSC TIP CART 19.5X105X1.3 (SAW) ×3 IMPLANT
SEALER BIPOLAR AQUA 6.0 (INSTRUMENTS) ×3 IMPLANT
SET HNDPC FAN SPRY TIP SCT (DISPOSABLE) ×2 IMPLANT
SHELL ACETAB 3H 52 E HIP (Shell) IMPLANT
SPONGE T-LAP 18X18 ~~LOC~~+RFID (SPONGE) ×9 IMPLANT
STAPLER INSORB 30 2030 C-SECTI (MISCELLANEOUS) IMPLANT
STEM FEM CMTL 15X150 133D (Stem) ×1 IMPLANT
SUT MNCRL AB 3-0 PS2 18 (SUTURE) ×3 IMPLANT
SUT MON AB 2-0 CT1 36 (SUTURE) ×3 IMPLANT
SUT STRATAFIX PDO 1 14 VIOLET (SUTURE) ×2
SUT STRATFX PDO 1 14 VIOLET (SUTURE) ×1
SUT VIC AB 2-0 CT1 27 (SUTURE)
SUT VIC AB 2-0 CT1 TAPERPNT 27 (SUTURE) IMPLANT
SUTURE STRATFX PDO 1 14 VIOLET (SUTURE) ×2 IMPLANT
SYR 3ML LL SCALE MARK (SYRINGE) ×3 IMPLANT
TRAY FOLEY MTR SLVR 16FR STAT (SET/KITS/TRAYS/PACK) IMPLANT
TUBE SUCTION HIGH CAP CLEAR NV (SUCTIONS) ×3 IMPLANT
WATER STERILE IRR 1000ML POUR (IV SOLUTION) ×3 IMPLANT

## 2021-05-23 NOTE — Progress Notes (Signed)
ANTICOAGULATION CONSULT NOTE - Follow Up Consult  Pharmacy Consult for heparin Indication: Mechanical AV/MV and Afib  Allergies  Allergen Reactions   Digoxin Other (See Comments)    bradycardia   Statins     myalgia   Patient Measurements: Height: 5' 3.5" (161.3 cm) Weight: 62.7 kg (138 lb 3.7 oz) IBW/kg (Calculated) : 53.55  Vital Signs: Temp: 98.1 F (36.7 C) (12/27 2035) Temp Source: Oral (12/27 2035) BP: 112/60 (12/27 2035) Pulse Rate: 103 (12/27 2035)  Labs: Recent Labs    05/20/21 0836 05/20/21 1107 05/21/21 0432 05/21/21 1629 05/22/21 0258 05/22/21 1243 05/22/21 1436 05/22/21 2312  HGB 11.1*  --  10.7*  --  10.2*  --   --   --   HCT 35.3*  --  33.8*  --  32.5*  --   --   --   PLT 455*  --  400  --  387  --   --   --   LABPROT  --  31.9* 25.9*  --  14.6  --   --   --   INR  --  3.1* 2.4*  --  1.1  --   --   --   HEPARINUNFRC  --   --   --    < > 0.11* 0.28* 0.27* 0.30  CREATININE 0.97  --  0.94  --  0.99  --   --   --    < > = values in this interval not displayed.    Estimated Creatinine Clearance: 44.7 mL/min (by C-G formula based on SCr of 0.99 mg/dL).  Assessment: 70 yo F presenting with right hip fracture after mechanical fall, pending possible right hip surgery. Patient on warfarin PTA (last dose 12/24) for atrial fibrillation and mechanical aortic and mechanical mitral valve replacement. Warfarin on hold in anticipation of surgery. Pharmacy asked to begin IV heparin as a bridge once INR <2.5.  Heparin level 0.3 therapeutic on 1350 units/hr No issues reported.  Goal of Therapy:  Heparin level 0.3-0.7 units/ml Monitor platelets by anticoagulation protocol: Yes   Plan:  continue heparin gtt at 1,350 units/hr Heparin to stop at 0600 for surgery Monitor CBC, s/s of bleed F/u restart of warfarin / heparin bridge post-op  Dolly Rias RPh 05/10/2021, 12:51 AM

## 2021-05-23 NOTE — H&P (View-Only) (Signed)
ORTHOPAEDIC CONSULTATION  REQUESTING PHYSICIAN: Flora Lipps, MD  PCP:  Marin Olp, MD  Chief Complaint: Right hip pain  HPI: 70 y.o. female with right displaced femoral neck fracture and right nondisplaced sacral fracture.  Multiple medical comorbidities including aortic valve replacement, A. fib on warfarin, CHF, CKD stage III, and history of TIA.  TRH admitted for perioperative optimization. Coumadin was held, and a heparin gtt was started.  Heparin gtt stopped this morning.   Past Medical History:  Diagnosis Date   Anemia    Aortic insufficiency    AORTIC VALVE REPLACEMENT, HX OF 04/27/2007   Qualifier: Diagnosis of  By: Leanne Chang MD, Bruce     Cancer [facility])    left breast- surgery and chemo   CHF (congestive heart failure) ([place])    Chronic kidney disease    Stage 3   Congestive heart failure ([place]) 08/03/2009   [facility]. Paul Half. Last echo 2010 with EF 30%. Restrictive due to radiation. AVR MVR. 02/2014 visit planned.     Coronary artery disease    11/06/06 [facility]): 40% oLM, 60% dLAD, 30% oRCA   Dyspnea    with exertion   Dysrhythmia    paroxsymal a-flutter 2018; intermittent left BBB (2016)   Edema of both feet    GI bleed    Heart murmur    mitral and aortic valve replacement   Hodgkin's disease    ~1974, s/p Mant[place] field radiation and chemo   Hyperlipidemia    Hypertension    Hypothyroidism    Migraine    none since 2008   MITRAL VALVE REPLACEMENT, HX OF 04/27/2007   Qualifier: Diagnosis of  By: Leanne Chang MD, Bruce     Pericarditis    s/p pericardidal stripping through lateral thoracotomy 01/2007 [facility])   Stroke ([place]) 2008   No residual effects   TIA (transient ischemic attack)    Tricuspid regurgitation    Varicose veins of both lower extremities    left worse than right- have gotten larger in April 2020   WPW (Wolff-Parkinson-White syndrome)    Past Surgical History:  Procedure Laterality Date   AORTIC VALVE SURGERY     19 mm mechanical [employer] AVR  05/2006 ([facility], Dr. Lajuana Matte)   bilateral breast implants     BIOPSY  02/22/2019   Procedure: BIOPSY;  Surgeon: Irving Copas., MD;  Location: [facility] ENDOSCOPY;  Service: Gastroenterology;;   COLONOSCOPY WITH PROPOFOL N/A 02/22/2019   Procedure: COLONOSCOPY WITH PROPOFOL;  Surgeon: Irving Copas., MD;  Location: [place];  Service: Gastroenterology;  Laterality: N/A;   ENDOMETRIAL ABLATION     ENDOSCOPIC MUCOSAL RESECTION  01/10/2020   Procedure: ENDOSCOPIC MUCOSAL RESECTION;  Surgeon: Rush Landmark Telford Nab., MD;  Location: [place];  Service: Gastroenterology;;   ENTEROSCOPY N/A 02/22/2019   Procedure: ENTEROSCOPY;  Surgeon: Rush Landmark Telford Nab., MD;  Location: [place];  Service: Gastroenterology;  Laterality: N/A;   ESOPHAGOGASTRODUODENOSCOPY (EGD) WITH PROPOFOL N/A 01/10/2020   Procedure: ESOPHAGOGASTRODUODENOSCOPY (EGD) WITH PROPOFOL;  Surgeon: Rush Landmark Telford Nab., MD;  Location: [place];  Service: Gastroenterology;  Laterality: N/A;   EXPLORATORY LAPAROTOMY     HEMOSTASIS CLIP PLACEMENT  02/22/2019   Procedure: HEMOSTASIS CLIP PLACEMENT;  Surgeon: Irving Copas., MD;  Location: [place];  Service: Gastroenterology;;   HEMOSTASIS CLIP PLACEMENT  01/10/2020   Procedure: HEMOSTASIS CLIP PLACEMENT;  Surgeon: Irving Copas., MD;  Location: [place];  Service: Gastroenterology;;   KYPHOPLASTY N/A 12/10/2018   Procedure: KYPHOPLASTY THORACIC SIX- THORACIC  SEVEN;  Surgeon: Newman Pies, MD;  Location: [place];  Service: Neurosurgery;  Laterality: N/A;  KYPHOPLASTY THORACIC SIX- THORACIC SEVEN   MASTECTOMY     MITRAL VALVE REPLACEMENT     25 mm [employer] MVR, 05/2006, Dr. Lajuana Matte, [place]   pericardial stripping  9/08   POLYPECTOMY  02/22/2019   Procedure: POLYPECTOMY;  Surgeon: Mansouraty, Telford Nab., MD;  Location: [place];  Service: Gastroenterology;;   SPLENECTOMY     ~ [place]  INJECTION  01/10/2020   Procedure: SUBMUCOSAL LIFTING INJECTION;  Surgeon: Irving Copas., MD;  Location: [place];  Service: Gastroenterology;;   SUBMUCOSAL TATTOO INJECTION  02/22/2019   Procedure: SUBMUCOSAL TATTOO INJECTION;  Surgeon: Irving Copas., MD;  Location: [place];  Service: Gastroenterology;;   THORACOTOMY     for pericardial stripping 01/2007 (Dr. Elenor Quinones, [facility])   TONSILLECTOMY     valvulopathy  06/13/06   Social History   Socioeconomic History   Marital status: Married    Spouse name: Not on file   Number of children: 0   Years of education: Not on file   Highest education level: Not on file  Occupational History   Occupation: retired  Tobacco Use   Smoking status: Never   Smokeless tobacco: Never  Vaping Use   Vaping Use: Never used  Substance and Sexual Activity   Alcohol use: No   Drug use: No   Sexual activity: Not on file  Other Topics Concern   Not on file  Social History Narrative   Lived in [place] for 18 years. Moved here for the weather.    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      Husband retired January 2015. Married 1991 (2nd marriage). No kids.       Hobbies: walks 2 miles per day, read, work outside         [place] Strain: Low Risk    Difficulty of Paying Living Expenses: Not hard at all  Food Insecurity: No Food Insecurity   Worried About Charity fundraiser in the Last Year: Never true   Arboriculturist in the Last Year: Never true  Transportation Needs: No Transportation Needs   Lack of Transportation (Medical): No   Lack of Transportation (Non-Medical): No  Physical Activity: Sufficiently Active   Days of Exercise per Week: 3 days   Minutes of Exercise per Session: 90 min  Stress: No Stress Concern Present   Feeling of Stress : Only a little  Social Connections: Moderately Integrated   Frequency  of Communication with Friends and Family: More than three times a week   Frequency of Social Gatherings with Friends and Family: Twice a week   Attends Religious Services: 1 to 4 times per year   Active Member of [employer] or Organizations: No   Attends Music therapist: Never   Marital Status: Married   Family History  Problem Relation Age of Onset   Heart disease Mother    Cancer Mother        breast   Other Father        hunting gun accident   Heart disease Maternal Aunt    Cancer Maternal Aunt        uterine   Heart disease Maternal Grandmother    Diabetes Maternal Grandmother  Hypercalcemia Neg Hx    Colon cancer Neg Hx    Esophageal cancer Neg Hx    Liver disease Neg Hx    Pancreatic cancer Neg Hx    Rectal cancer Neg Hx    Stomach cancer Neg Hx    Allergies  Allergen Reactions   Digoxin Other (See Comments)    bradycardia   Statins     myalgia   Prior to Admission medications   Medication Sig Start Date End Date Taking? Authorizing Provider  amoxicillin (AMOXIL) 500 MG capsule Take 2,000 mg by mouth as directed. Take 2000 mg 1 hour prior to dental procedures   Yes [provider]  aspirin 81 MG tablet Take 81 mg by mouth daily.   Yes [provider]  aspirin-acetaminophen-caffeine (EXCEDRIN MIGRAINE) [phone] MG tablet Take 2 tablets by mouth every 6 (six) hours as needed for headache.   Yes [provider]  cholecalciferol (VITAMIN D3) 25 MCG (1000 UT) tablet Take 1,000 Units by mouth daily.   Yes [provider]  denosumab (PROLIA) 60 MG/ML SOSY injection Inject 60 mg into the skin every 6 (six) months.  04/01/19  Yes [provider]  diphenhydramine-acetaminophen (TYLENOL PM) 25-500 MG TABS tablet Take 2 tablets by mouth at bedtime as needed (sleep).    Yes [provider]  docusate sodium (COLACE) 100 MG capsule Take 100 mg by mouth every evening.   Yes [provider]  ezetimibe (ZETIA)  10 MG tablet Take 10 mg by mouth every evening.  12/30/17  Yes [provider]  furosemide (LASIX) 40 MG tablet TAKE 1 TABLET EVERY DAY Patient taking differently: Take 40 mg by mouth daily. 05/11/21  Yes Marin Olp, MD  levothyroxine (SYNTHROID) 100 MCG tablet TAKE 1 TABLET EVERY DAY Patient taking differently: Take 100 mcg by mouth daily before breakfast. 02/08/21  Yes Marin Olp, MD  Magnesium 250 MG TABS Take 250 mg by mouth daily with lunch.   Yes [provider]  metoprolol (TOPROL-XL) 50 MG 24 hr tablet Take 50 mg by mouth 2 (two) times daily.   Yes [provider]  omeprazole (PRILOSEC) 20 MG capsule TAKE 1 CAPSULE (20 MG TOTAL) BY MOUTH DAILY 30 MINUTES BEFORE BREAKFAST OR DINNER. Patient taking differently: Take 20 mg by mouth daily. 05/14/21  Yes Mansouraty, Telford Nab., MD  potassium chloride SA (KLOR-CON) 20 MEQ tablet TAKE 1 TABLET EVERY DAY Patient taking differently: Take 20 mEq by mouth daily. 04/23/21  Yes Marin Olp, MD  REPATHA SURECLICK 956 MG/ML SOAJ Inject 140 mg into the skin every 14 (fourteen) days. 09/14/19  Yes [provider]  spironolactone (ALDACTONE) 25 MG tablet Take 50 mg by mouth daily.   Yes [provider]  traMADol (ULTRAM) 50 MG tablet Take 50-100 mg by mouth every 6 (six) hours as needed. 05/15/21  Yes [provider]  warfarin (COUMADIN) 5 MG tablet TAKE 1 TABLET EVERY DAY EXCEPT TAKE 1/2 TABLET ON FRIDAYS AS DIRECTED Patient taking differently: Take 5 mg by mouth See admin instructions. 2.5 mg Monday,Wednesday,Friday 5 mg Tuesday,Thursday,Saturday and sunday 02/08/21  Yes Marin Olp, MD   No results found.  Positive ROS: All other systems have been reviewed and were otherwise negative with the exception of those mentioned in the HPI and as above.  Physical Exam: General: Alert, no acute distress Cardiovascular: No pedal edema Respiratory: No cyanosis, no use of accessory  musculature GI: No organomegaly, abdomen is soft and non-tender  Skin: No lesions in the area of chief complaint Neurologic: Sensation intact distally Psychiatric: Patient is competent for consent with normal mood and affect Lymphatic: No axillary or cervical lymphadenopathy  MUSCULOSKELETAL: RLE rotated and shortened. Dorsiflexion and plantarflexion intact. EHL strength 4/5. Quad strength very weak, firing noted but not able to raise against gravity. Possibly due to pain  Assessment: Right displaced femoral neck fracture  Right nondisplaced sacral fracture   Plan: Right displaced femoral neck fracture: Had  discussion with patient about this injury and treatment options.  Discussed risks and benefits of total hip arthroplasty. Risks include blood clot, infection, leg length discrepancy, and death. Alternative is to do nothing which would leave patient bedridden. Patient consented to procedure.  Right nondisplaced sacral fracture: This is a stable injury.  Recommend treating this closed, without manipulation.  This injury would be amenable to treatment with appropriate pain control and weightbearing as tolerated with a walker.    Dorothyann Peng, PA [phone]    05/16/2021 10:43 AM

## 2021-05-23 NOTE — Progress Notes (Signed)
Patient has not signed informed consent for surgery on 05/01/2021 due to patient needing a doctor to explain the procedure and answer any concerns and questions the patient may have. Will pass along to dayshift nurse.

## 2021-05-23 NOTE — Consult Note (Addendum)
ORTHOPAEDIC CONSULTATION  REQUESTING PHYSICIAN: Flora Lipps, MD  PCP:  Marin Olp, MD  Chief Complaint: Right hip pain  HPI: 70 y.o. female with right displaced femoral neck fracture and right nondisplaced sacral fracture.  Multiple medical comorbidities including aortic valve replacement, A. fib on warfarin, CHF, CKD stage III, and history of TIA.  TRH admitted for perioperative optimization. Coumadin was held, and a heparin gtt was started.  Heparin gtt stopped this morning.   Past Medical History:  Diagnosis Date   Anemia    Aortic insufficiency    AORTIC VALVE REPLACEMENT, HX OF 04/27/2007   Qualifier: Diagnosis of  By: Leanne Chang MD, Bruce     Cancer [facility])    left breast- surgery and chemo   CHF (congestive heart failure) ([place])    Chronic kidney disease    Stage 3   Congestive heart failure ([place]) 08/03/2009   [facility] Dr. Paul Half. Last echo 2010 with EF 30%. Restrictive due to radiation. AVR MVR. 02/2014 visit planned.     Coronary artery disease    11/06/06 [facility]): 40% oLM, 60% dLAD, 30% oRCA   Dyspnea    with exertion   Dysrhythmia    paroxsymal a-flutter 2018; intermittent left BBB (2016)   Edema of both feet    GI bleed    Heart murmur    mitral and aortic valve replacement   Hodgkin's disease    ~1974, s/p [place] radiation and chemo   Hyperlipidemia    Hypertension    Hypothyroidism    Migraine    none since 2008   MITRAL VALVE REPLACEMENT, HX OF 04/27/2007   Qualifier: Diagnosis of  By: Leanne Chang MD, Bruce     Pericarditis    s/p pericardidal stripping through lateral thoracotomy 01/2007 [facility])   Stroke ([place]) 2008   No residual effects   TIA (transient ischemic attack)    Tricuspid regurgitation    Varicose veins of both lower extremities    left worse than right- have gotten larger in April 2020   WPW (Wolff-Parkinson-White syndrome)    Past Surgical History:  Procedure Laterality Date   AORTIC VALVE SURGERY     19 mm mechanical [employer] AVR  05/2006 ([facility], Dr. Lajuana Matte)   bilateral breast implants     BIOPSY  02/22/2019   Procedure: BIOPSY;  Surgeon: Irving Copas., MD;  Location: [facility] ENDOSCOPY;  Service: Gastroenterology;;   COLONOSCOPY WITH PROPOFOL N/A 02/22/2019   Procedure: COLONOSCOPY WITH PROPOFOL;  Surgeon: Irving Copas., MD;  Location: [place];  Service: Gastroenterology;  Laterality: N/A;   ENDOMETRIAL ABLATION     ENDOSCOPIC MUCOSAL RESECTION  01/10/2020   Procedure: ENDOSCOPIC MUCOSAL RESECTION;  Surgeon: Rush Landmark Telford Nab., MD;  Location: [place];  Service: Gastroenterology;;   ENTEROSCOPY N/A 02/22/2019   Procedure: ENTEROSCOPY;  Surgeon: Rush Landmark Telford Nab., MD;  Location: [place];  Service: Gastroenterology;  Laterality: N/A;   ESOPHAGOGASTRODUODENOSCOPY (EGD) WITH PROPOFOL N/A 01/10/2020   Procedure: ESOPHAGOGASTRODUODENOSCOPY (EGD) WITH PROPOFOL;  Surgeon: Rush Landmark Telford Nab., MD;  Location: [place];  Service: Gastroenterology;  Laterality: N/A;   EXPLORATORY LAPAROTOMY     HEMOSTASIS CLIP PLACEMENT  02/22/2019   Procedure: HEMOSTASIS CLIP PLACEMENT;  Surgeon: Irving Copas., MD;  Location: [place];  Service: Gastroenterology;;   HEMOSTASIS CLIP PLACEMENT  01/10/2020   Procedure: HEMOSTASIS CLIP PLACEMENT;  Surgeon: Irving Copas., MD;  Location: [place];  Service: Gastroenterology;;   KYPHOPLASTY N/A 12/10/2018   Procedure: KYPHOPLASTY THORACIC SIX- THORACIC  SEVEN;  Surgeon: Newman Pies, MD;  Location: [place];  Service: Neurosurgery;  Laterality: N/A;  KYPHOPLASTY THORACIC SIX- THORACIC SEVEN   MASTECTOMY     MITRAL VALVE REPLACEMENT     25 mm [employer] MVR, 05/2006, Dr. Lajuana Matte, [place]   pericardial stripping  9/08   POLYPECTOMY  02/22/2019   Procedure: POLYPECTOMY;  Surgeon: Mansouraty, Telford Nab., MD;  Location: [place];  Service: Gastroenterology;;   SPLENECTOMY     ~ [place]  INJECTION  01/10/2020   Procedure: SUBMUCOSAL LIFTING INJECTION;  Surgeon: Irving Copas., MD;  Location: [place];  Service: Gastroenterology;;   SUBMUCOSAL TATTOO INJECTION  02/22/2019   Procedure: SUBMUCOSAL TATTOO INJECTION;  Surgeon: Irving Copas., MD;  Location: [place];  Service: Gastroenterology;;   THORACOTOMY     for pericardial stripping 01/2007 (Dr. Elenor Quinones, [facility])   TONSILLECTOMY     valvulopathy  06/13/06   Social History   Socioeconomic History   Marital status: Married    Spouse name: Not on file   Number of children: 0   Years of education: Not on file   Highest education level: Not on file  Occupational History   Occupation: retired  Tobacco Use   Smoking status: Never   Smokeless tobacco: Never  Vaping Use   Vaping Use: Never used  Substance and Sexual Activity   Alcohol use: No   Drug use: No   Sexual activity: Not on file  Other Topics Concern   Not on file  Social History Narrative   Lived in [place] for 18 years. Moved here for the weather.    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      Husband retired January 2015. Married 1991 (2nd marriage). No kids.       Hobbies: walks 2 miles per day, read, work outside         [place] Strain: Low Risk    Difficulty of Paying Living Expenses: Not hard at all  Food Insecurity: No Food Insecurity   Worried About Charity fundraiser in the Last Year: Never true   Arboriculturist in the Last Year: Never true  Transportation Needs: No Transportation Needs   Lack of Transportation (Medical): No   Lack of Transportation (Non-Medical): No  Physical Activity: Sufficiently Active   Days of Exercise per Week: 3 days   Minutes of Exercise per Session: 90 min  Stress: No Stress Concern Present   Feeling of Stress : Only a little  Social Connections: Moderately Integrated   Frequency  of Communication with Friends and Family: More than three times a week   Frequency of Social Gatherings with Friends and Family: Twice a week   Attends Religious Services: 1 to 4 times per year   Active Member of [employer] or Organizations: No   Attends Music therapist: Never   Marital Status: Married   Family History  Problem Relation Age of Onset   Heart disease Mother    Cancer Mother        breast   Other Father        hunting gun accident   Heart disease Maternal Aunt    Cancer Maternal Aunt        uterine   Heart disease Maternal Grandmother    Diabetes Maternal Grandmother  Hypercalcemia Neg Hx    Colon cancer Neg Hx    Esophageal cancer Neg Hx    Liver disease Neg Hx    Pancreatic cancer Neg Hx    Rectal cancer Neg Hx    Stomach cancer Neg Hx    Allergies  Allergen Reactions   Digoxin Other (See Comments)    bradycardia   Statins     myalgia   Prior to Admission medications   Medication Sig Start Date End Date Taking? Authorizing Provider  amoxicillin (AMOXIL) 500 MG capsule Take 2,000 mg by mouth as directed. Take 2000 mg 1 hour prior to dental procedures   Yes [provider]  aspirin 81 MG tablet Take 81 mg by mouth daily.   Yes [provider]  aspirin-acetaminophen-caffeine (EXCEDRIN MIGRAINE) [phone] MG tablet Take 2 tablets by mouth every 6 (six) hours as needed for headache.   Yes [provider]  cholecalciferol (VITAMIN D3) 25 MCG (1000 UT) tablet Take 1,000 Units by mouth daily.   Yes [provider]  denosumab (PROLIA) 60 MG/ML SOSY injection Inject 60 mg into the skin every 6 (six) months.  04/01/19  Yes [provider]  diphenhydramine-acetaminophen (TYLENOL PM) 25-500 MG TABS tablet Take 2 tablets by mouth at bedtime as needed (sleep).    Yes [provider]  docusate sodium (COLACE) 100 MG capsule Take 100 mg by mouth every evening.   Yes [provider]  ezetimibe (ZETIA)  10 MG tablet Take 10 mg by mouth every evening.  12/30/17  Yes [provider]  furosemide (LASIX) 40 MG tablet TAKE 1 TABLET EVERY DAY Patient taking differently: Take 40 mg by mouth daily. 05/11/21  Yes Marin Olp, MD  levothyroxine (SYNTHROID) 100 MCG tablet TAKE 1 TABLET EVERY DAY Patient taking differently: Take 100 mcg by mouth daily before breakfast. 02/08/21  Yes Marin Olp, MD  Magnesium 250 MG TABS Take 250 mg by mouth daily with lunch.   Yes [provider]  metoprolol (TOPROL-XL) 50 MG 24 hr tablet Take 50 mg by mouth 2 (two) times daily.   Yes [provider]  omeprazole (PRILOSEC) 20 MG capsule TAKE 1 CAPSULE (20 MG TOTAL) BY MOUTH DAILY 30 MINUTES BEFORE BREAKFAST OR DINNER. Patient taking differently: Take 20 mg by mouth daily. 05/14/21  Yes Mansouraty, Telford Nab., MD  potassium chloride SA (KLOR-CON) 20 MEQ tablet TAKE 1 TABLET EVERY DAY Patient taking differently: Take 20 mEq by mouth daily. 04/23/21  Yes Marin Olp, MD  REPATHA SURECLICK 527 MG/ML SOAJ Inject 140 mg into the skin every 14 (fourteen) days. 09/14/19  Yes [provider]  spironolactone (ALDACTONE) 25 MG tablet Take 50 mg by mouth daily.   Yes [provider]  traMADol (ULTRAM) 50 MG tablet Take 50-100 mg by mouth every 6 (six) hours as needed. 05/15/21  Yes [provider]  warfarin (COUMADIN) 5 MG tablet TAKE 1 TABLET EVERY DAY EXCEPT TAKE 1/2 TABLET ON FRIDAYS AS DIRECTED Patient taking differently: Take 5 mg by mouth See admin instructions. 2.5 mg Monday,Wednesday,Friday 5 mg Tuesday,Thursday,Saturday and sunday 02/08/21  Yes Marin Olp, MD   No results found.  Positive ROS: All other systems have been reviewed and were otherwise negative with the exception of those mentioned in the HPI and as above.  Physical Exam: General: Alert, no acute distress Cardiovascular: No pedal edema Respiratory: No cyanosis, no use of accessory  musculature GI: No organomegaly, abdomen is soft and non-tender  Skin: No lesions in the area of chief complaint Neurologic: Sensation intact distally Psychiatric: Patient is competent for consent with normal mood and affect Lymphatic: No axillary or cervical lymphadenopathy  MUSCULOSKELETAL: RLE rotated and shortened. Dorsiflexion and plantarflexion intact. EHL strength 4/5. Quad strength very weak, firing noted but not able to raise against gravity. Possibly due to pain  Assessment: Right displaced femoral neck fracture  Right nondisplaced sacral fracture   Plan: Right displaced femoral neck fracture: Had  discussion with patient about this injury and treatment options.  Discussed risks and benefits of total hip arthroplasty. Risks include blood clot, infection, leg length discrepancy, and death. Alternative is to do nothing which would leave patient bedridden. Patient consented to procedure.  Right nondisplaced sacral fracture: This is a stable injury.  Recommend treating this closed, without manipulation.  This injury would be amenable to treatment with appropriate pain control and weightbearing as tolerated with a walker.    Dorothyann Peng, PA [phone]    05/24/2021 10:43 AM

## 2021-05-23 NOTE — Progress Notes (Signed)
PROGRESS NOTE    Grace [name]  YEM:[id number] DOB: 11/17/1950 DOA: 05/26/2021 PCP: Marin Olp, MD   Brief Narrative:   70 year old female with past medical history of aortic valve replacement with mechanical valve, Coumadin, statin constipatory, CKD stage IIIa, history of breast cancer, Hodgkin's disease, history of TIA.  Hospital after sustaining a mechanical fall.  She was noted to have right femoral fracture on the CT scan.  There was no mention of loss of consciousness prior to the fall.  Orthopedics was consulted.  Patient was initiated on heparin drip.  She was seen by cardiology and a repeat echocardiogram was done which showed a dilated cardiomyopathy with ejection fraction of 55 to 60% with severe TR.  At this time, patient is awaiting for surgical intervention.   Assessment & Plan:   Principal Problem:   Closed right femoral fracture (HCC) Active Problems:   Hypothyroidism   Hyperlipidemia   Congestive heart failure (HCC)   History of cardiovascular disorder   CKD (chronic kidney disease), stage III (HCC)   Atrial flutter (HCC)   Hx of long-term (current) use of anticoagulants   CAD (coronary artery disease)   Mechanical fall causing displaced right femoral neck fracture Patient was on Coumadin as outpatient.  Currently on hold.  On heparin drip.  Plan for surgical intervention today by orthopedics. Given extensive cardiac history she is intermediate to high risk but necessary surgery to gain her mobility and control her pain.  Nausea and vomiting Improved  History of mechanical valve replacement, aortic and mitral Severe tricuspid regurgitation Patient had mechanical valve replacement including 2012.  On Coumadin as outpatient.  Currently on heparin drip.  Will need to restart heparin and aspirin after surgery.  We will give 1 dose of IV Lasix today.  Hypothyroidism Continue Synthroid  Mild CHF congestive heart failure reduced ejection fraction, EF 55% 2D  echo january 2019 showing EF of 35%.  Repeat 2D echocardiogram this time showed EF of 55%, dilated cardiomyopathy, replaced valve noted.  Severe TR. patient is on Lasix Toprol-XL Aldactone at home.  We will repeat 1 dose of IV Lasix today  CKD stage II Continue to monitor BMP.  Latest creatinine at 0.7  History of atrial fibrillation with flutter On Coumadin at home, currently on hold, on heparin drip.  Latest INR of 1.1  Coronary artery disease No active issues.  No chest pain.  Resume aspirin after surgery.  History of constrictive pericarditis - Status post pericardiectomy.  No active disease.  Hodgkin's lymphoma, History of breast cancer status post bilateral mastectomy and reconstruction - Status post radiation and chemo.  Patient follows up with Dr Alen Blew oncology as outpatient.  Iron deficiency anemia - Follows outpatient with oncology.  Latest hemoglobin of 10.8.  We will closely monitor   Chronic venous insufficiency - Follows outpatient vascular surgery Dr. Doren Custard as outpatient.  DVT prophylaxis: Heparin drip  Code Status: Full code  Family Communication:   Spoke with the patient's husband at bedside  Status is: Inpatient  Remains inpatient appropriate because: For surgical intervention, heparin drip  Subjective: Today, patient was seen and examined at bedside.  Patient states that she is ready for surgery.  Denies any chest pain, nausea, vomiting, fever or chills.  He pain.  On supplemental oxygen.  Physical Examination: General:  Average built, not in obvious distress on nasal cannula oxygen HENT:   No scleral pallor or icterus noted. Oral mucosa is moist.  CVS: S1 &S2 heard.  Mechanical valve  sounds Abdomen: Soft, nontender, nondistended.  Bowel sounds are heard.   Extremities: No cyanosis, clubbing or edema.  Peripheral pulses are palpable.  Right hip tenderness, right lower extremity shortened and externally rotated. Psych: Alert, awake and oriented, normal  mood CNS:  No cranial nerve deficits.  Power equal in all extremities.   Skin: Warm and dry.  No rashes noted.   Objective: Vitals:   05/22/21 1142 05/22/21 1654 05/22/21 2035 05/05/2021 0424  BP: 109/65 (!) 107/55 112/60 111/65  Pulse: (!) 102 (!) 105 (!) 103 96  Resp: 18 18 20 18   Temp: 99.5 F (37.5 C) 98 F (36.7 C) 98.1 F (36.7 C) 98.3 F (36.8 C)  TempSrc: Oral Oral Oral Oral  SpO2: 91% 96% 95% 95%  Weight:      Height:        Intake/Output Summary (Last 24 hours) at 05/13/2021 0747 Last data filed at 05/14/2021 0600 Gross per 24 hour  Intake 994.63 ml  Output 400 ml  Net 594.63 ml    Filed Weights   05/20/21 1122  Weight: 62.7 kg    Data Reviewed:   CBC: Recent Labs  Lab 05/08/2021 2147 05/20/21 0836 05/21/21 0432 05/22/21 0258 04/30/2021 0503  WBC 13.8* 21.1* 17.7* 16.4* 14.8*  NEUTROABS 10.9*  --   --   --   --   HGB 11.7* 11.1* 10.7* 10.2* 10.8*  HCT 38.2 35.3* 33.8* 32.5* 33.8*  MCV 92.0 90.3 90.1 89.5 87.6  PLT 501* 455* 400 387 937    Basic Metabolic Panel: Recent Labs  Lab 05/03/2021 2147 05/20/21 0836 05/21/21 0432 05/22/21 0258 05/08/2021 0503  NA 136 134* 132* 132* 130*  K 4.0 4.3 4.6 4.0 3.8  CL 102 102 100 99 96*  CO2 27 26 21* 22 25  GLUCOSE 95 172* 110* 102* 105*  BUN 22 19 16 15 15   CREATININE 1.04* 0.97 0.94 0.99 0.71  CALCIUM 8.3* 8.1* 7.7* 7.5* 7.5*  MG  --   --  2.1 2.0 2.3    GFR: Estimated Creatinine Clearance: 55.4 mL/min (by C-G formula based on SCr of 0.71 mg/dL). Liver Function Tests: Recent Labs  Lab 05/13/2021 2147 05/20/21 0836  AST 31 30  ALT 21 21  ALKPHOS 72 70  BILITOT 0.5 0.7  PROT 7.0 6.7  ALBUMIN 3.7 3.4*    No results for input(s): LIPASE, AMYLASE in the last 168 hours. No results for input(s): AMMONIA in the last 168 hours. Coagulation Profile: Recent Labs  Lab 05/24/2021 2147 05/20/21 1107 05/21/21 0432 05/22/21 0258 05/06/2021 0503  INR 2.9* 3.1* 2.4* 1.1 1.1    Cardiac Enzymes: No  results for input(s): CKTOTAL, CKMB, CKMBINDEX, TROPONINI in the last 168 hours. BNP (last 3 results) No results for input(s): PROBNP in the last 8760 hours. HbA1C: No results for input(s): HGBA1C in the last 72 hours. CBG: No results for input(s): GLUCAP in the last 168 hours. Lipid Profile: No results for input(s): CHOL, HDL, LDLCALC, TRIG, CHOLHDL, LDLDIRECT in the last 72 hours. Thyroid Function Tests: No results for input(s): TSH, T4TOTAL, FREET4, T3FREE, THYROIDAB in the last 72 hours. Anemia Panel: No results for input(s): VITAMINB12, FOLATE, FERRITIN, TIBC, IRON, RETICCTPCT in the last 72 hours. Sepsis Labs: No results for input(s): PROCALCITON, LATICACIDVEN in the last 168 hours.  Recent Results (from the past 240 hour(s))  Resp Panel by RT-PCR (Flu A&B, Covid) Nasopharyngeal Swab     Status: None   Collection Time: 05/20/21 12:02 AM   Specimen:  Nasopharyngeal Swab; Nasopharyngeal(NP) swabs in vial transport medium  Result Value Ref Range Status   SARS Coronavirus 2 by RT PCR NEGATIVE NEGATIVE Final    Comment: (NOTE) SARS-CoV-2 target nucleic acids are NOT DETECTED.  The SARS-CoV-2 RNA is generally detectable in upper respiratory specimens during the acute phase of infection. The lowest concentration of SARS-CoV-2 viral copies this assay can detect is 138 copies/mL. A negative result does not preclude SARS-Cov-2 infection and should not be used as the sole basis for treatment or other patient management decisions. A negative result may occur with  improper specimen collection/handling, submission of specimen other than nasopharyngeal swab, presence of viral mutation(s) within the areas targeted by this assay, and inadequate number of viral copies(<138 copies/mL). A negative result must be combined with clinical observations, patient history, and epidemiological information. The expected result is Negative.  Fact Sheet for Patients:   EntrepreneurPulse.com.au  Fact Sheet for Healthcare Providers:  IncredibleEmployment.be  This test is no t yet approved or cleared by the [place] FDA and  has been authorized for detection and/or diagnosis of SARS-CoV-2 by FDA under an Emergency Use Authorization (EUA). This EUA will remain  in effect (meaning this test can be used) for the duration of the COVID-19 declaration under Section 564(b)(1) of the Act, 21 U.S.C.section 360bbb-3(b)(1), unless the authorization is terminated  or revoked sooner.       Influenza A by PCR NEGATIVE NEGATIVE Final   Influenza B by PCR NEGATIVE NEGATIVE Final    Comment: (NOTE) The Xpert Xpress SARS-CoV-2/FLU/RSV plus assay is intended as an aid in the diagnosis of influenza from Nasopharyngeal swab specimens and should not be used as a sole basis for treatment. Nasal washings and aspirates are unacceptable for Xpert Xpress SARS-CoV-2/FLU/RSV testing.  Fact Sheet for Patients: EntrepreneurPulse.com.au  Fact Sheet for Healthcare Providers: IncredibleEmployment.be  This test is not yet approved or cleared by the [place] FDA and has been authorized for detection and/or diagnosis of SARS-CoV-2 by FDA under an Emergency Use Authorization (EUA). This EUA will remain in effect (meaning this test can be used) for the duration of the COVID-19 declaration under Section 564(b)(1) of the Act, 21 U.S.C. section 360bbb-3(b)(1), unless the authorization is terminated or revoked.  Performed at [facility], [place] [address] [place], [place] [address]   Surgical PCR screen     Status: None   Collection Time: 04/26/2021  2:28 AM   Specimen: Nasal Mucosa; Nasal Swab  Result Value Ref Range Status   MRSA, PCR NEGATIVE NEGATIVE Final   Staphylococcus aureus NEGATIVE NEGATIVE Final    Comment: (NOTE) The Xpert SA Assay (FDA approved for NASAL specimens in patients  28 years of age and older), is one component of a comprehensive surveillance program. It is not intended to diagnose infection nor to guide or monitor treatment. Performed at [facility], [place] [address], [place], [place] [address]        Radiology Studies: No results found.     Scheduled Meds:  chlorhexidine  60 mL Topical Once   cholecalciferol  1,000 Units Oral Daily   docusate sodium  100 mg Oral QPM   ezetimibe  10 mg Oral QPM   levothyroxine  100 mcg Oral Q0600   metoprolol succinate  50 mg Oral BID   mupirocin ointment  1 application Nasal BID   pantoprazole  40 mg Oral Daily   povidone-iodine  2 application Topical Once   povidone-iodine  2 application Topical  Once   Continuous Infusions:   ceFAZolin (ANCEF) IV     tranexamic acid       LOS: 4 days    Flora Lipps, MD [facility] If 7PM-7AM, please contact night-coverage 05/22/2021, 7:47 AM

## 2021-05-23 NOTE — Plan of Care (Signed)

## 2021-05-23 NOTE — Discharge Instructions (Signed)
? ?Dr. Rayshun Kandler ?Joint Replacement Specialist ?[facility] ?[address] ?[place], [place] [address] ?[phone] ? ? ?TOTAL HIP REPLACEMENT POSTOPERATIVE DIRECTIONS ? ? ? ?Hip Rehabilitation, Guidelines Following Surgery  ? ?WEIGHT BEARING ?Weight bearing as tolerated with assist device (walker, cane, etc) as directed, use it as long as suggested by your surgeon or therapist, typically at least 4-6 weeks. ? ?The results of a hip operation are greatly improved after range of motion and muscle strengthening exercises. Follow all safety measures which are given to protect your hip. If any of these exercises cause increased pain or swelling in your joint, decrease the amount until you are comfortable again. Then slowly increase the exercises. Call your caregiver if you have problems or questions.  ? ?HOME CARE INSTRUCTIONS  ?Most of the following instructions are designed to prevent the dislocation of your new hip.  ?Remove items at home which could result in a fall. This includes throw rugs or furniture in walking pathways.  ?Continue medications as instructed at time of discharge. ?You may have some home medications which will be placed on hold until you complete the course of blood thinner medication. ?You may start showering once you are discharged home. Do not remove your dressing. ?Do not put on socks or shoes without following the instructions of your caregivers.   ?Sit on chairs with arms. Use the chair arms to help push yourself up when arising.  ?Arrange for the use of a toilet seat elevator so you are not sitting low.  ?Walk with walker as instructed.  ?You may resume a sexual relationship in one month or when given the OK by your caregiver.  ?Use walker as long as suggested by your caregivers.  ?You may put full weight on your legs and walk as much as is comfortable. ?Avoid periods of inactivity such as sitting longer than an hour when not asleep. This helps prevent blood  clots.  ?You may return to work once you are cleared by your surgeon.  ?Do not drive a car for 6 weeks or until released by your surgeon.  ?Do not drive while taking narcotics.  ?Wear elastic stockings for two weeks following surgery during the day but you may remove then at night.  ?Make sure you keep all of your appointments after your operation with all of your doctors and caregivers. You should call the office at the above phone number and make an appointment for approximately two weeks after the date of your surgery. ?Please pick up a stool softener and laxative for home use as long as you are requiring pain medications. ?ICE to the affected hip every three hours for 30 minutes at a time and then as needed for pain and swelling. Continue to use ice on the hip for pain and swelling from surgery. You may notice swelling that will progress down to the foot and ankle.  This is normal after surgery.  Elevate the leg when you are not up walking on it.   ?It is important for you to complete the blood thinner medication as prescribed by your doctor. ?Continue to use the breathing machine which will help keep your temperature down.  It is common for your temperature to cycle up and down following surgery, especially at night when you are not up moving around and exerting yourself.  The breathing machine keeps your lungs expanded and your temperature down. ? ?RANGE OF MOTION AND STRENGTHENING EXERCISES  ?These exercises are designed to help you   keep full movement of your hip joint. Follow your caregiver's or physical therapist's instructions. Perform all exercises about fifteen times, three times per day or as directed. Exercise both hips, even if you have had only one joint replacement. These exercises can be done on a training (exercise) mat, on the floor, on a table or on a bed. Use whatever works the best and is most comfortable for you. Use music or television while you are exercising so that the exercises are a  pleasant break in your day. This will make your life better with the exercises acting as a break in routine you can look forward to.  ?Lying on your back, slowly slide your foot toward your buttocks, raising your knee up off the floor. Then slowly slide your foot back down until your leg is straight again.  ?Lying on your back spread your legs as far apart as you can without causing discomfort.  ?Lying on your side, raise your upper leg and foot straight up from the floor as far as is comfortable. Slowly lower the leg and repeat.  ?Lying on your back, tighten up the muscle in the front of your thigh (quadriceps muscles). You can do this by keeping your leg straight and trying to raise your heel off the floor. This helps strengthen the largest muscle supporting your knee.  ?Lying on your back, tighten up the muscles of your buttocks both with the legs straight and with the knee bent at a comfortable angle while keeping your heel on the floor.  ? ?SKILLED REHAB INSTRUCTIONS: ?If the patient is transferred to a skilled rehab facility following release from the hospital, a list of the current medications will be sent to the facility for the patient to continue.  When discharged from the skilled rehab facility, please have the facility set up the patient's Home Health Physical Therapy prior to being released. Also, the skilled facility will be responsible for providing the patient with their medications at time of release from the facility to include their pain medication and their blood thinner medication. If the patient is still at the rehab facility at time of the two week follow up appointment, the skilled rehab facility will also need to assist the patient in arranging follow up appointment in our office and any transportation needs. ? ?POST-OPERATIVE OPIOID TAPER INSTRUCTIONS: ?It is important to wean off of your opioid medication as soon as possible. If you do not need pain medication after your surgery it is ok  to stop day one. ?Opioids include: ?Codeine, Hydrocodone(Norco, Vicodin), Oxycodone(Percocet, oxycontin) and hydromorphone amongst others.  ?Long term and even short term use of opiods can cause: ?Increased pain response ?Dependence ?Constipation ?Depression ?Respiratory depression ?And more.  ?Withdrawal symptoms can include ?Flu like symptoms ?Nausea, vomiting ?And more ?Techniques to manage these symptoms ?Hydrate well ?Eat regular healthy meals ?Stay active ?Use relaxation techniques(deep breathing, meditating, yoga) ?Do Not substitute Alcohol to help with tapering ?If you have been on opioids for less than two weeks and do not have pain than it is ok to stop all together.  ?Plan to wean off of opioids ?This plan should start within one week post op of your joint replacement. ?Maintain the same interval or time between taking each dose and first decrease the dose.  ?Cut the total daily intake of opioids by one tablet each day ?Next start to increase the time between doses. ?The last dose that should be eliminated is the evening dose.  ? ? ?MAKE   SURE YOU:  ?Understand these instructions.  ?Will watch your condition.  ?Will get help right away if you are not doing well or get worse. ? ?Pick up stool softner and laxative for home use following surgery while on pain medications. ?Do not remove your dressing. ?The dressing is waterproof--it is OK to take showers. ?Continue to use ice for pain and swelling after surgery. ?Do not use any lotions or creams on the incision until instructed by your surgeon. ?Total Hip Protocol. ? ?

## 2021-05-23 NOTE — Progress Notes (Signed)
ANTICOAGULATION CONSULT NOTE - Follow Up Consult  Pharmacy Consult for warfarin Indication: Mechanical aortic valve, mechanical mitral valve, and afib  Allergies  Allergen Reactions   Digoxin Other (See Comments)    bradycardia   Statins     myalgia   Patient Measurements: Height: 5' 3.5" (161.3 cm) Weight: 62.7 kg (138 lb 3.7 oz) IBW/kg (Calculated) : 53.55  Vital Signs: Temp: 98.4 F (36.9 C) (12/28 1645) BP: 119/51 (12/28 1700) Pulse Rate: 102 (12/28 1700)  Labs: Recent Labs    05/21/21 0432 05/21/21 1629 05/22/21 0258 05/22/21 1243 05/22/21 1436 05/22/21 2312 05/09/2021 0503  HGB 10.7*  --  10.2*  --   --   --  10.8*  HCT 33.8*  --  32.5*  --   --   --  33.8*  PLT 400  --  387  --   --   --  376  LABPROT 25.9*  --  14.6  --   --   --  14.5  INR 2.4*  --  1.1  --   --   --  1.1  HEPARINUNFRC  --    < > 0.11* 0.28* 0.27* 0.30  --   CREATININE 0.94  --  0.99  --   --   --  0.71   < > = values in this interval not displayed.    Estimated Creatinine Clearance: 55.4 mL/min (by C-G formula based on SCr of 0.71 mg/dL).  Assessment: 70 yo F presenting with right hip fracture after mechanical fall, pending possible right hip surgery. Patient on warfarin PTA (last dose 12/24) for atrial fibrillation and mechanical aortic and mechanical mitral valve replacement. On admission, warfarin was held and pt was given one dose of vitamin K in anticipation of surgical intervention. Pharmacy was consulted to dose heparin drip for pre-operative bridge therapy once INR <2.5.  Pharmacy now consulted to resume warfarin POD#0.   Home dose (confirmed with [facility] clinic note from 05/08/21): -Warfarin 2.5 mg MWF; 5 mg all other days -INR goal: 2.5 - 3.5   Significant Events: -12/25: Phytonadione 10 mg PO x1 dose -12/26: Heparin drip initiated for INR < 2.5 -12/28: Heparin stopped at 0600 prior to surgery  Today, 05/20/2021 Heparin drip was stopped at 0600 this morning prior to surgery per  ortho instructions Pt underwent right total hip arthroplasty, anterior approach as well as nonoperative treatment of right sacral ala fracture today. AET 1649 INR = 1.1 is subtherapeutic as expected s/p vitamin K and holding warfarin.  CBC: Hgb slightly low but stable; Plt WNL  Goal of Therapy:  Heparin level 0.3-0.7 units/ml Monitor platelets by anticoagulation protocol: Yes   Plan:  Warfarin 6 mg PO once this evening (~1.5x TDD) CBC, INR with AM labs tomorrow Heparin drip for post-op bridging to begin @ 1800 on 05/24/21 with no bolus per Dr. Lyla Glassing.  Lenis Noon, PharmD 05/04/2021 5:18 PM

## 2021-05-23 NOTE — Op Note (Signed)
OPERATIVE REPORT  SURGEON: Rod Can, MD   ASSISTANT: Cherlynn June, PA-C  PREOPERATIVE DIAGNOSIS: 1. Displaced Right femoral neck fracture.  2. Nondisplaced right sacral ala fracture.  POSTOPERATIVE DIAGNOSIS: 1. Displaced Right femoral neck fracture.  2. Nondisplaced right sacral ala fracture.  PROCEDURE: 1. Right total hip arthroplasty, anterior approach.  2. Nonoperative treatment of right sacral ala fracture.   IMPLANTS: Biomet Taperloc Reduced Distal stem, size 15x150 mm, high offset. Biomet G7 OsseoTi Cup, size 52 mm. Biomet Vivacit-E liner, size 36 mm, E, neutral. Biomet Biolox ceramic head ball, size 36 + 0 mm.  ANESTHESIA:  General  ANTIBIOTICS: 2g ancef.  ESTIMATED BLOOD LOSS:-150 mL.    DRAINS: JP drain in the subcutaneous tissue.  COMPLICATIONS: None.   CONDITION: PACU - hemodynamically stable.   BRIEF CLINICAL NOTE: Grace [name] is a 70 y.o. female with a displaced Right femoral neck fracture. The patient was admitted to the hospitalist service and underwent perioperative risk stratification and medical optimization. Coumadin was stopped on admission, and then a heparin drip was started. The risks, benefits, and alternatives to total hip arthroplasty were explained, and the patient elected to proceed.  PROCEDURE IN DETAIL: The patient was taken to the operating room and general anesthesia was induced on the hospital bed.  The patient was then positioned on the Hana table.  All bony prominences were well padded.  The hip was prepped and draped in the normal sterile surgical fashion.  A time-out was called verifying side and site of surgery. Antibiotics were given within 60 minutes of beginning the procedure.   Bikini incision was made, and the direct anterior approach to the hip was performed through the Hueter interval.  Lateral femoral circumflex vessels were treated with the Auqumantys. The anterior capsule was exposed and an inverted T capsulotomy was  made.  Fracture hematoma was encountered and evacuated. The patient was found to have a comminuted Right subcapital femoral neck fracture.  I freshened the femoral neck cut with a saw.  I removed the femoral neck fragment.  A corkscrew was placed into the head and the head was removed.  This was passed to the back table and was measured. The pubofemoral ligament was released subperiosteally to the lesser trochanter.  Acetabular exposure was achieved, and the pulvinar and labrum were excised. Sequential reaming of the acetabulum was then performed up to a size 51 mm reamer under direct visulization. A 52 mm cup was then opened and impacted into place at approximately 40 degrees of abduction and 20 degrees of anteversion. The final polyethylene liner was impacted into place and acetabular osteophytes were removed.    I then gained femoral exposure taking care to protect the abductors and greater trochanter.  This was performed using standard external rotation, extension, and adduction.  A cookie cutter was used to enter the femoral canal, and then the femoral canal finder was placed.  Sequential broaching was performed up to a size 15.  Calcar planer was used on the femoral neck remnant.  I placed a high offset neck and a trial head ball.  The hip was reduced.  Leg lengths and offset were checked fluoroscopically.  The hip was dislocated and trial components were removed.  The final implants were placed, and the hip was reduced.  Fluoroscopy was used to confirm component position and leg lengths.  At 90 degrees of external rotation and full extension, the hip was stable to an anterior directed force.   The wound was copiously irrigated  with Irrisept solution and normal saline using pule lavage.  Marcaine solution was injected into the periarticular soft tissue.  A 10 mm flat JP drain was sewn in the subcutaneous tissue with a 3-0 nylon suture. The wound was closed in layers using #1 Stratafix for the fascia, 2-0  Vicryl for the subcutaneous fat, 2-0 Monocryl for the deep dermal layer, 3-0 running Monocryl subcuticular stitch, and Dermabond for the skin.  Once the glue was fully dried, an Aquacell Ag dressing was applied.  The patient was transported to the recovery room in stable condition.  Sponge, needle, and instrument counts were correct at the end of the case x2.  The patient tolerated the procedure well and there were no known complications.Nondisplaced right sacral ala fracture was treated nonoperatively.   Please note that a surgical assistant was a medical necessity for this procedure to perform it in a safe and expeditious manner. Assistant was necessary to provide appropriate retraction of vital neurovascular structures, to prevent femoral fracture, and to allow for anatomic placement of the prosthesis.

## 2021-05-23 NOTE — Progress Notes (Addendum)
0740[name] to give patient PRN pain medicine for 5/10 pain for right hip, and patient was complaining of numbness in right lower extremity. Patient had mentioned that she noticed it over night but did not mention anything about it until the pain medicine was given. Patient's lower right extremity was warm, dry to the touch and had good circulation. Patient could still feel some but more so pressure. Patient could move right ankle and toes. Notified dayshift nurse, as well as Camera operator.

## 2021-05-23 NOTE — Progress Notes (Signed)
Progress Note  Patient Name: Grace [name] Date of Encounter: 04/26/2021  [facility] Cardiologist: None  here Dr. Harrell Gave but usually [facility]   Subjective   70 yo with mechanical AVR, MVR ( due to mantle  radiation)  Here with hip fracture INR is now normal, warfarin is on hold on IV heparin No chest pain Inpatient Medications    Scheduled Meds:  chlorhexidine  60 mL Topical Once   cholecalciferol  1,000 Units Oral Daily   docusate sodium  100 mg Oral QPM   ezetimibe  10 mg Oral QPM   levothyroxine  100 mcg Oral Q0600   metoprolol succinate  50 mg Oral BID   mupirocin ointment  1 application Nasal BID   pantoprazole  40 mg Oral Daily   povidone-iodine  2 application Topical Once   povidone-iodine  2 application Topical Once   Continuous Infusions:   ceFAZolin (ANCEF) IV     tranexamic acid     PRN Meds: acetaminophen, dextromethorphan-guaiFENesin, hydrALAZINE, ipratropium-albuterol, metoCLOPramide (REGLAN) injection, metoprolol tartrate, morphine injection, ondansetron **OR** ondansetron (ZOFRAN) IV, oxyCODONE, senna-docusate, traZODone   Vital Signs    Vitals:   05/22/21 1142 05/22/21 1654 05/22/21 2035 05/18/2021 0424  BP: 109/65 (!) 107/55 112/60 111/65  Pulse: (!) 102 (!) 105 (!) 103 96  Resp: 18 18 20 18   Temp: 99.5 F (37.5 C) 98 F (36.7 C) 98.1 F (36.7 C) 98.3 F (36.8 C)  TempSrc: Oral Oral Oral Oral  SpO2: 91% 96% 95% 95%  Weight:      Height:        Intake/Output Summary (Last 24 hours) at 05/14/2021 1030 Last data filed at 05/04/2021 0600 Gross per 24 hour  Intake 994.63 ml  Output 400 ml  Net 594.63 ml   Last 3 Weights 05/20/2021 04/30/2021 04/04/2021  Weight (lbs) 138 lb 3.7 oz 132 lb 8 oz 130 lb 9.6 oz  Weight (kg) 62.7 kg 60.102 kg 59.24 kg      Telemetry    SR - Personally Reviewed  ECG    No new - Personally Reviewed  Physical Exam   GEN: No acute distress.   Neck: No JVD Cardiac: RRR, no murmurs, rubs,  or gallops.  Respiratory: Clear to auscultation bilaterally. GI: Soft, nontender, non-distended  MS: No edema; No deformity. Neuro:  Nonfocal  Psych: Normal affect   Labs    High Sensitivity Troponin:  No results for input(s): TROPONINIHS in the last 720 hours.   Chemistry Recent Labs  Lab 05/08/2021 2147 05/20/21 0836 05/21/21 0432 05/22/21 0258 05/07/2021 0503  NA 136 134* 132* 132* 130*  K 4.0 4.3 4.6 4.0 3.8  CL 102 102 100 99 96*  CO2 27 26 21* 22 25  GLUCOSE 95 172* 110* 102* 105*  BUN 22 19 16 15 15   CREATININE 1.04* 0.97 0.94 0.99 0.71  CALCIUM 8.3* 8.1* 7.7* 7.5* 7.5*  MG  --   --  2.1 2.0 2.3  PROT 7.0 6.7  --   --   --   ALBUMIN 3.7 3.4*  --   --   --   AST 31 30  --   --   --   ALT 21 21  --   --   --   ALKPHOS 72 70  --   --   --   BILITOT 0.5 0.7  --   --   --   GFRNONAA 58* >60 >60 >60 >60  ANIONGAP 7 6 11  11 9    Lipids No results for input(s): CHOL, TRIG, HDL, LABVLDL, LDLCALC, CHOLHDL in the last 168 hours.  Hematology Recent Labs  Lab 05/21/21 0432 05/22/21 0258 05/14/2021 0503  WBC 17.7* 16.4* 14.8*  RBC 3.75* 3.63* 3.86*  HGB 10.7* 10.2* 10.8*  HCT 33.8* 32.5* 33.8*  MCV 90.1 89.5 87.6  MCH 28.5 28.1 28.0  MCHC 31.7 31.4 32.0  RDW 15.9* 15.6* 15.6*  PLT 400 387 376   Thyroid No results for input(s): TSH, FREET4 in the last 168 hours.  BNPNo results for input(s): BNP, PROBNP in the last 168 hours.  DDimer No results for input(s): DDIMER in the last 168 hours.   Radiology    No results found.  Cardiac Studies   Echo 05/20/21  IMPRESSIONS     1. Left ventricular ejection fraction, by estimation, is 55 to 60%. The  left ventricle has normal function. Left ventricular endocardial border  not optimally defined to evaluate regional wall motion. Left ventricular  diastolic parameters are  indeterminate.   2. Right ventricular systolic function is normal. The right ventricular  size is mildly enlarged.   3. Right atrial size was  moderately dilated.   4. The mitral valve has been repaired/replaced. No evidence of mitral  valve regurgitation. No evidence of mitral stenosis.   5. Tricuspid valve regurgitation is severe.   6. The aortic valve was not well visualized. Aortic valve regurgitation  is not visualized. No aortic stenosis is present. Aortic valve mean  gradient measures 7.0 mmHg. Aortic valve Vmax measures 1.79 m/s.   7. The inferior vena cava is normal in size with greater than 50%  respiratory variability, suggesting right atrial pressure of 3 mmHg.   8. Very limited echo due to poor sound wave transmission.   FINDINGS   Left Ventricle: Left ventricular ejection fraction, by estimation, is 55  to 60%. The left ventricle has normal function. Left ventricular  endocardial border not optimally defined to evaluate regional wall motion.  The left ventricular internal cavity  size was normal in size. There is no left ventricular hypertrophy. Left  ventricular diastolic parameters are indeterminate.   Right Ventricle: The right ventricular size is mildly enlarged. No  increase in right ventricular wall thickness. Right ventricular systolic  function is normal.   Her monitor obtained in February 2019 showed predominantly sinus rhythm, with rare nonsustained ectopic atrial rhythm, longest run 13 beats, occasional multifocal PVCs.  Most recent echocardiogram obtained at [facility] on 01/05/2019 revealed EF 50%, trivial AI, mild TR, mechanical mitral and aortic valve functioning normally, RV enlarged with normal function, no pericardial effusion Patient Profile     70 y.o. female  with a hx of mechanical mitral and aortic valve replacement at [facility] in 2008 on chronic Coumadin therapy, mild LV dysfunction with baseline EF 50%, PAF/flutter, constrictive pericarditis s/p pericardiectomy, HTN, HLD, hypothyroidism, osteopenia, venous insufficiency, Hodgkin's disease in 1974 s/p Mantle field radiation and chemo, left breast cancer  and nonobstructive CAD diagnosed in 2008 admitted with hip fracture and holding coumadin.   Assessment & Plan    Pre op eval - prior to hip surgery today around 1-1:30  She has been given the OK to proceed with surgery. Plan on restarting ASA following surgery along with coumadin  INR today 1.1 Heparin drip stopped at 0600 today for surgery Labs stable though Na 130  Restart spiro - ? Tomorrow  Back on metoprolol BP 111/65       For questions or  updates, please contact [place] Please consult [email or web address] for contact info under        Signed, Cecilie Kicks, NP  04/29/2021, 10:30 AM

## 2021-05-23 NOTE — Anesthesia Postprocedure Evaluation (Signed)
Anesthesia Post Note  Patient: Grace [name]  Procedure(s) Performed: TOTAL HIP ARTHROPLASTY ANTERIOR APPROACH (Right: Hip)     Patient location during evaluation: PACU Anesthesia Type: General Level of consciousness: awake Pain management: pain level controlled Vital Signs Assessment: post-procedure vital signs reviewed and stable Respiratory status: spontaneous breathing, nonlabored ventilation, respiratory function stable and patient connected to nasal cannula oxygen Cardiovascular status: blood pressure returned to baseline and stable Postop Assessment: no apparent nausea or vomiting Anesthetic complications: no   No notable events documented.  Last Vitals:  Vitals:   05/15/2021 1730 04/26/2021 1745  BP: 105/69 122/61  Pulse: 99 99  Resp: 16 15  Temp:    SpO2: 97% 95%    Last Pain:  Vitals:   05/20/2021 1745  TempSrc:   PainSc: 0-No pain                 Josetta Wigal P Rama Sorci

## 2021-05-23 NOTE — Anesthesia Preprocedure Evaluation (Addendum)
Anesthesia Evaluation  Patient identified by MRN, date of birth, ID band Patient awake    Reviewed: Allergy & Precautions, NPO status , Patient's Chart, lab work & pertinent test results  Airway Mallampati: II  TM Distance: >3 FB Neck ROM: Full    Dental no notable dental hx.    Pulmonary COPD,    Pulmonary exam normal breath sounds clear to auscultation       Cardiovascular hypertension, Pt. on home beta blockers + CAD and +CHF  Normal cardiovascular exam Rhythm:Regular Rate:Normal  ECG: NSR, rate 95  ECHO: Left ventricular ejection fraction, by estimation, is 55 to 60%. The left ventricle has normal function. Left ventricular endocardial border not optimally defined to evaluate regional wall motion. Left ventricular diastolic parameters are indeterminate. Right ventricular systolic function is normal. The right ventricular size is mildly enlarged. Right atrial size was moderately dilated. The mitral valve has been repaired/replaced. No evidence of mitral valve regurgitation. No evidence of mitral stenosis. Tricuspid valve regurgitation is severe. The aortic valve was not well visualized. Aortic valve regurgitation is not visualized. No aortic stenosis is present. Aortic valve mean gradient measures 7.0 mmHg. Aortic valve Vmax measures 1.79 m/s. The inferior vena cava is normal in size with greater than 50% respiratory variability, suggesting right atrial pressure of 3 mmHg. Very limited echo due to poor sound wave transmission.   Neuro/Psych  Headaches, TIACVA, No Residual Symptoms negative psych ROS   GI/Hepatic Neg liver ROS, hiatal hernia,   Endo/Other  Hypothyroidism Hyponatremia   Renal/GU Renal disease     Musculoskeletal negative musculoskeletal ROS (+)   Abdominal   Peds  Hematology  (+) Blood dyscrasia, anemia ,   Anesthesia Other Findings fracture right hip  Reproductive/Obstetrics                             Anesthesia Physical Anesthesia Plan  ASA: 3  Anesthesia Plan: General   Post-op Pain Management:    Induction: Intravenous  PONV Risk Score and Plan: 3 and Ondansetron, Dexamethasone and Treatment may vary due to age or medical condition  Airway Management Planned: Oral ETT  Additional Equipment:   Intra-op Plan:   Post-operative Plan: Extubation in OR  Informed Consent: I have reviewed the patients History and Physical, chart, labs and discussed the procedure including the risks, benefits and alternatives for the proposed anesthesia with the patient or authorized representative who has indicated his/her understanding and acceptance.     Dental advisory given  Plan Discussed with: CRNA  Anesthesia Plan Comments: (Took coumadin 3 1/2 days ago)        Anesthesia Quick Evaluation

## 2021-05-23 NOTE — Interval H&P Note (Signed)
History and Physical Interval Note:  05/11/2021 1:33 PM  Grace [name]  has presented today for surgery, with the diagnosis of fracture right hip.  The various methods of treatment have been discussed with the patient and family. After consideration of risks, benefits and other options for treatment, the patient has consented to  Procedure(s): TOTAL HIP ARTHROPLASTY ANTERIOR APPROACH (Right) as a surgical intervention.  The patient's history has been reviewed, patient examined, no change in status, stable for surgery.  I have reviewed the patient's chart and labs.  Questions were answered to the patient's satisfaction.     [name]

## 2021-05-23 NOTE — Progress Notes (Signed)
Patient has been NPO since midnight just sips with meds. Notified on call provider to make sure it was okay since patient had oral medication this morning.

## 2021-05-23 NOTE — Transfer of Care (Signed)
Immediate Anesthesia Transfer of Care Note  Patient: Grace [name]  Procedure(s) Performed: TOTAL HIP ARTHROPLASTY ANTERIOR APPROACH (Right: Hip)  Patient Location: PACU  Anesthesia Type:General  Level of Consciousness: awake  Airway & Oxygen Therapy: Patient Spontanous Breathing and Patient connected to face mask oxygen  Post-op Assessment: Report given to RN and Post -op Vital signs reviewed and stable  Post vital signs: Reviewed and stable  Last Vitals:  Vitals Value Taken Time  BP 135/55 05/25/2021 1645  Temp 36.9 C 05/01/2021 1645  Pulse 103 05/06/2021 1648  Resp 21 05/02/2021 1648  SpO2 96 % 04/28/2021 1648  Vitals shown include unvalidated device data.  Last Pain:  Vitals:   05/25/2021 0825  TempSrc:   PainSc: 3       Patients Stated Pain Goal: 2 (73/22/56 7209)  Complications: No notable events documented.

## 2021-05-23 NOTE — Anesthesia Procedure Notes (Signed)
Procedure Name: Intubation Date/Time: 04/28/2021 2:36 PM Performed by: Sharlette Dense, CRNA Pre-anesthesia Checklist: Patient identified, Emergency Drugs available, Suction available and Patient being monitored Patient Re-evaluated:Patient Re-evaluated prior to induction Oxygen Delivery Method: Circle system utilized Preoxygenation: Pre-oxygenation with 100% oxygen Induction Type: IV induction Ventilation: Mask ventilation without difficulty Laryngoscope Size: Miller and 2 Grade View: Grade II Tube type: Oral Tube size: 7.0 mm Number of attempts: 1 Airway Equipment and Method: Stylet and Oral airway Placement Confirmation: ETT inserted through vocal cords under direct vision, positive ETCO2 and breath sounds checked- equal and bilateral Secured at: 20 cm Tube secured with: Tape Dental Injury: Teeth and Oropharynx as per pre-operative assessment

## 2021-05-24 ENCOUNTER — Encounter (HOSPITAL_COMMUNITY): Payer: Self-pay | Admitting: Orthopedic Surgery

## 2021-05-24 DIAGNOSIS — S72001A Fracture of unspecified part of neck of right femur, initial encounter for closed fracture: Secondary | ICD-10-CM | POA: Diagnosis not present

## 2021-05-24 DIAGNOSIS — Z952 Presence of prosthetic heart valve: Secondary | ICD-10-CM | POA: Diagnosis not present

## 2021-05-24 DIAGNOSIS — I2510 Atherosclerotic heart disease of native coronary artery without angina pectoris: Secondary | ICD-10-CM | POA: Diagnosis not present

## 2021-05-24 DIAGNOSIS — I483 Typical atrial flutter: Secondary | ICD-10-CM | POA: Diagnosis not present

## 2021-05-24 DIAGNOSIS — N183 Chronic kidney disease, stage 3 unspecified: Secondary | ICD-10-CM | POA: Diagnosis not present

## 2021-05-24 LAB — PROTIME-INR
INR: 1.2 (ref 0.8–1.2)
Prothrombin Time: 14.8 seconds (ref 11.4–15.2)

## 2021-05-24 LAB — BASIC METABOLIC PANEL
Anion gap: 10 (ref 5–15)
BUN: 23 mg/dL (ref 8–23)
CO2: 23 mmol/L (ref 22–32)
Calcium: 6.5 mg/dL — ABNORMAL LOW (ref 8.9–10.3)
Chloride: 90 mmol/L — ABNORMAL LOW (ref 98–111)
Creatinine, Ser: 0.89 mg/dL (ref 0.44–1.00)
GFR, Estimated: >60 mL/min (ref >60)
Glucose, Bld: 140 mg/dL — ABNORMAL HIGH (ref 70–99)
Potassium: 3.5 mmol/L (ref 3.5–5.1)
Sodium: 123 mmol/L — ABNORMAL LOW (ref 135–145)

## 2021-05-24 LAB — CBC
HCT: 27.9 % — ABNORMAL LOW (ref 36.0–46.0)
Hemoglobin: 9.1 g/dL — ABNORMAL LOW (ref 12.0–15.0)
MCH: 28.1 pg (ref 26.0–34.0)
MCHC: 32.6 g/dL (ref 30.0–36.0)
MCV: 86.1 fL (ref 80.0–100.0)
Platelets: 335 10*3/uL (ref 150–400)
RBC: 3.24 MIL/uL — ABNORMAL LOW (ref 3.87–5.11)
RDW: 15.2 % (ref 11.5–15.5)
WBC: 15.5 10*3/uL — ABNORMAL HIGH (ref 4.0–10.5)
nRBC: 0.1 % (ref 0.0–0.2)

## 2021-05-24 LAB — MAGNESIUM: Magnesium: 2 mg/dL (ref 1.7–2.4)

## 2021-05-24 MED ORDER — WARFARIN SODIUM 6 MG PO TABS
6.0000 mg | ORAL_TABLET | Freq: Once | ORAL | Status: AC
  Administered 2021-05-24: 17:00:00 6 mg via ORAL
  Filled 2021-05-24: qty 1

## 2021-05-24 MED ORDER — OXYCODONE HCL 5 MG PO TABS: 5.0000 mg | ORAL_TABLET | ORAL | 0 refills | Status: AC | PRN

## 2021-05-24 MED ORDER — OXYCODONE HCL 5 MG PO TABS
5.0000 mg | ORAL_TABLET | ORAL | Status: DC | PRN
  Administered 2021-05-24 – 2021-05-29 (×9): 5 mg via ORAL
  Filled 2021-05-24 (×9): qty 1

## 2021-05-24 MED ORDER — ORAL CARE MOUTH RINSE
15.0000 mL | Freq: Two times a day (BID) | OROMUCOSAL | Status: DC
  Administered 2021-05-24 – 2021-05-28 (×10): 15 mL via OROMUCOSAL

## 2021-05-24 MED ORDER — HEPARIN (PORCINE) 25000 UT/250ML-% IV SOLN
1500.0000 [IU]/h | INTRAVENOUS | Status: DC
  Administered 2021-05-24: 19:00:00 1400 [IU]/h via INTRAVENOUS
  Administered 2021-05-25 – 2021-05-29 (×6): 1500 [IU]/h via INTRAVENOUS
  Filled 2021-05-24 (×9): qty 250

## 2021-05-24 NOTE — Evaluation (Signed)
Physical Therapy Evaluation Patient Details Name: Grace [name] MRN: [id number] DOB: February 13, 1951 Today's Date: 05/24/2021  History of Present Illness  70 year female presenting with Displaced Right femoral neck fracture and Nondisplaced right sacral ala fracture after a fall on 05/11/2021. Pt underwent Right total hip arthroplasty, anterior approach on 05/15/2021. Nonoperative treatment of right sacral ala fracture.   PMHx: kyphoplasty of T6, T7 on 12/09/20, cardiac valvular disease, coumadin, breast CA, CKD, CHF, CAD, Hodgkins disease, mitral Valve replacement, migraines, pericarditis, TIA, Wolff Parkinson's syndrome.  Subacute T8 compression fracture (per chart, around 12/5) as well, but no orders for brace.  Clinical Impression  Patient is s/p above surgery resulting in functional limitations due to the deficits listed below (see PT Problem List). Pt assisted with ambulating however limited by pain, fatigue and then dizziness.  Vitals obtained upon sitting in recliner as below however SPO2 dropped to 86% on room air and improved with application of 2L O2 [place]. Patient will benefit from skilled PT to increase their independence and safety with mobility to allow discharge to the venue listed below.   Pt plans to d/c home with spouse however currently needing at least min assist at this time.   05/24/21 1140  Vital Signs  Pulse Rate 99  Pulse Rate Source Monitor  BP 139/69  BP Location Right Arm  BP Method Automatic  Patient Position (if appropriate) Sitting  Oxygen Therapy  SpO2 92 %  O2 Device Room Air        Recommendations for follow up therapy are one component of a multi-disciplinary discharge planning process, led by the attending physician.  Recommendations may be updated based on patient status, additional functional criteria and insurance authorization.  Follow Up Recommendations Home health PT    Assistance Recommended at Discharge Frequent or constant Supervision/Assistance   Functional Status Assessment Patient has had a recent decline in their functional status and demonstrates the ability to make significant improvements in function in a reasonable and predictable amount of time.  Equipment Recommendations  Rolling walker (2 wheels);BSC/3in1    Recommendations for Other Services       Precautions / Restrictions Precautions Precautions: Fall Restrictions Weight Bearing Restrictions: No RUE Weight Bearing: Weight bearing as tolerated (Clarified by Dr. Lyla Glassing. WBAT) LUE Weight Bearing: Weight bearing as tolerated RLE Weight Bearing: Weight bearing as tolerated Other Position/Activity Restrictions: WBAT with walker      Mobility  Bed Mobility Overal bed mobility: Needs Assistance Bed Mobility: Supine to Sit     Supine to sit: Min assist;HOB elevated     General bed mobility comments: pt in recliner on arrival    Transfers Overall transfer level: Needs assistance Equipment used: Rolling walker (2 wheels) Transfers: Sit to/from Stand Sit to Stand: Min assist   Step pivot transfers: Min assist       General transfer comment: verbal cues for UE and LE positioning for pain control, pt required assist to rise and then control descent    Ambulation/Gait Ambulation/Gait assistance: Min assist Gait Distance (Feet): 7 Feet Assistive device: Rolling walker (2 wheels) Gait Pattern/deviations: Step-to pattern;Decreased stance time - right;Antalgic Gait velocity: decr     General Gait Details: pt with difficulty advancing Rt LE, cues for sequence and use of RW to assist with weight bearing, increased time and effort, pt became dizzy and requested recliner; vitals obtained and Spo2 86% on room air so reapplied 2L O2 [place] (RN aware)  Stairs  Wheelchair Mobility    Modified Rankin (Stroke Patients Only)       Balance Overall balance assessment: History of Falls Sitting-balance support: Feet supported;No upper extremity  supported Sitting balance-Leahy Scale: Good     Standing balance support: During functional activity;Reliant on assistive device for balance Standing balance-Leahy Scale: Poor               High level balance activites: Backward walking;Side stepping High Level Balance Comments: Min As with RW, slow. Cues for sequencing.             Pertinent Vitals/Pain Pain Assessment: 0-10 Pain Score: 4  Breathing: normal Negative Vocalization: none Facial Expression: smiling or inexpressive Body Language: relaxed Consolability: no need to console PAINAD Score: 0 Pain Location: right hip Pain Descriptors / Indicators: Grimacing;Discomfort;Sore Pain Intervention(s): Monitored during session;Premedicated before session;Repositioned    Home Living Family/patient expects to be discharged to:: Private residence Living Arrangements: Spouse/significant other Available Help at Discharge: Available 24 hours/day Type of Home: House Home Access: Level entry       Home Layout: One level Home Equipment: Shower seat;Grab bars - tub/shower;Adaptive equipment      Prior Function Prior Level of Function : Independent/Modified Independent;History of Falls (last six months);Driving                     Hand Dominance   Dominant Hand: Right    Extremity/Trunk Assessment   Upper Extremity Assessment Upper Extremity Assessment: Overall WFL for tasks assessed    Lower Extremity Assessment Lower Extremity Assessment: RLE deficits/detail;Generalized weakness RLE Deficits / Details: anticipated post op hip weakness and pain    Cervical / Trunk Assessment Cervical / Trunk Assessment: Normal  Communication   Communication: No difficulties  Cognition Arousal/Alertness: Awake/alert Behavior During Therapy: WFL for tasks assessed/performed Overall Cognitive Status: Within Functional Limits for tasks assessed                                          General  Comments      Exercises     Assessment/Plan    PT Assessment Patient needs continued PT services  PT Problem List Decreased strength;Decreased range of motion;Cardiopulmonary status limiting activity;Pain;Decreased balance;Decreased mobility;Decreased knowledge of precautions;Decreased knowledge of use of DME;Decreased activity tolerance       PT Treatment Interventions Gait training;DME instruction;Therapeutic exercise;Balance training;Functional mobility training;Therapeutic activities;Patient/family education;Stair training    PT Goals (Current goals can be found in the Care Plan section)  Acute Rehab PT Goals PT Goal Formulation: With patient/family Time For Goal Achievement: 06/07/21 Potential to Achieve Goals: Good    Frequency Min 5X/week   Barriers to discharge        Co-evaluation               AM-PAC PT "6 Clicks" Mobility  Outcome Measure Help needed turning from your back to your side while in a flat bed without using bedrails?: A Little Help needed moving from lying on your back to sitting on the side of a flat bed without using bedrails?: A Lot Help needed moving to and from a bed to a chair (including a wheelchair)?: A Lot Help needed standing up from a chair using your arms (e.g., wheelchair or bedside chair)?: A Lot Help needed to walk in hospital room?: A Lot Help needed climbing 3-5 steps with a railing? : A Lot 6  Click Score: 13    End of Session Equipment Utilized During Treatment: Gait belt;Oxygen Activity Tolerance: Patient limited by fatigue Patient left: in chair;with call bell/phone within reach;with family/visitor present Nurse Communication: Mobility status PT Visit Diagnosis: Difficulty in walking, not elsewhere classified (R26.2);Pain Pain - Right/Left: Right Pain - part of body: Hip    Time: 7867-6720 PT Time Calculation (min) (ACUTE ONLY): 23 min   Charges:   PT Evaluation $PT Eval Low Complexity: 1 Low PT Treatments $Gait  Training: 8-22 mins      Jannette Spanner PT, DPT Acute Rehabilitation Services Pager: [phone] Office: [place] 05/24/2021, 12:41 PM

## 2021-05-24 NOTE — Evaluation (Signed)
Occupational Therapy Evaluation Patient Details Name: Grace [name] MRN: [id number] DOB: 11/23/1950 Today's Date: 05/24/2021   History of Present Illness 70 year female presenting with Displaced Right femoral neck fracture and Nondisplaced right sacral ala fracture after a fall on 05/03/2021. Underwent Right total hip arthroplasty, anterior approach. 2. Nonoperative treatment of right sacral ala fracture.   PMHx: kyphoplasty of T6, T7 on 12/09/20, cardiac valvular disease, coumadin, breast CA, CKD, CHF, CAD, Hodgkins disease, mitral Valve replacement, migraines, pericarditis, TIA, Wolff Parkinson's syndrome.  Pt reported new T8 fracture as well, but no orders for brace.   Clinical Impression   Patient is currently requiring assistance with ADLs including Minimal assist with toileting, moderate assist with LE dressing, minimal assist with bathing, and setup assist with seated grooming and UE ADLs.  Current level of function is below patient's typical baseline.  During this evaluation, patient was limited by generalized RLE weakness, impaired activity tolerance, and RLE impaired sensation s/p surgery with expectation of pain onset once numbness resolves, all of which has the potential to impact patient's safety and independence during functional mobility, as well as performance for ADLs.  Patient lives with her spouse, who is able to provide 24/7 supervision and assistance.  Patient demonstrates good rehab potential, and should benefit from continued skilled occupational therapy services while in acute care to maximize safety, independence and quality of life at home.  Continued occupational therapy services in the home is recommended depending on pt's progress.  ?    Recommendations for follow up therapy are one component of a multi-disciplinary discharge planning process, led by the attending physician.  Recommendations may be updated based on patient status, additional functional criteria and  insurance authorization.   Follow Up Recommendations  Home health OT    Assistance Recommended at Discharge Intermittent Supervision/Assistance  Functional Status Assessment  Patient has had a recent decline in their functional status and demonstrates the ability to make significant improvements in function in a reasonable and predictable amount of time.  Equipment Recommendations  None recommended by OT    Recommendations for Other Services       Precautions / Restrictions Precautions Precautions: Fall Restrictions Weight Bearing Restrictions: Yes RUE Weight Bearing: Weight bearing as tolerated (Clarified by Dr. Lyla Glassing. WBAT) LUE Weight Bearing: Weight bearing as tolerated RLE Weight Bearing: Weight bearing as tolerated      Mobility Bed Mobility Overal bed mobility: Needs Assistance Bed Mobility: Supine to Sit     Supine to sit: Min assist;HOB elevated     General bed mobility comments: Min As due to RLE still somewhat numb from surgery and required assist to move along bed. Pt controlled trunk well.    Transfers Overall transfer level: Needs assistance   Transfers: Sit to/from Stand;Bed to chair/wheelchair/BSC Sit to Stand: Min assist     Step pivot transfers: Min assist     General transfer comment: Pt stood from EOB to RW with Min As and pivoted slowly to recliner with cues for sequencing steps with RW and Min As. Cues for BUE reach back to chair for controlled descent with Min As.      Balance Overall balance assessment: History of Falls;Needs assistance Sitting-balance support: Feet supported;No upper extremity supported Sitting balance-Leahy Scale: Good     Standing balance support: During functional activity;Reliant on assistive device for balance Standing balance-Leahy Scale: Poor               High level balance activites: Backward walking;Side stepping High Level  Balance Comments: Min As with RW, slow. Cues for sequencing.            ADL either performed or assessed with clinical judgement   ADL                                               Vision Baseline Vision/History: 1 Wears glasses Patient Visual Report: No change from baseline       Perception Perception Perception: Within Functional Limits   Praxis Praxis Praxis: Intact    Pertinent Vitals/Pain Pain Assessment: No/denies pain Breathing: normal Negative Vocalization: none Facial Expression: smiling or inexpressive Body Language: relaxed Consolability: no need to console PAINAD Score: 0     Hand Dominance Right   Extremity/Trunk Assessment Upper Extremity Assessment Upper Extremity Assessment: Overall WFL for tasks assessed   Lower Extremity Assessment Lower Extremity Assessment: Defer to PT evaluation   Cervical / Trunk Assessment Cervical / Trunk Assessment: Normal   Communication Communication Communication: No difficulties   Cognition Arousal/Alertness: Awake/alert Behavior During Therapy: WFL for tasks assessed/performed Overall Cognitive Status: Within Functional Limits for tasks assessed                                       General Comments       Exercises     Shoulder Instructions      Home Living Family/patient expects to be discharged to:: Private residence Living Arrangements: Spouse/significant other Available Help at Discharge: Available 24 hours/day Type of Home: House Home Access: Level entry     Home Layout: One level     Bathroom Shower/Tub: Walk-in shower;Tub/shower unit   Bathroom Toilet: Handicapped height     Home Equipment: Shower seat;Grab bars - tub/shower;Adaptive equipment Adaptive Equipment: Reacher        Prior Functioning/Environment Prior Level of Function : Independent/Modified Independent;History of Falls (last six months);Driving                        OT Problem List: Decreased knowledge of use of DME or AE;Impaired balance  (sitting and/or standing);Impaired sensation      OT Treatment/Interventions: Self-care/ADL training;Therapeutic exercise;Therapeutic activities;Energy conservation;Patient/family education;DME and/or AE instruction;Balance training    OT Goals(Current goals can be found in the care plan section) Acute Rehab OT Goals Patient Stated Goal: "Get back to normal" OT Goal Formulation: With patient Time For Goal Achievement: 06/07/21 Potential to Achieve Goals: Good ADL Goals Pt Will Perform Lower Body Bathing: sitting/lateral leans;with modified independence Pt Will Perform Lower Body Dressing: with modified independence;with adaptive equipment;sitting/lateral leans;sit to/from stand Pt Will Transfer to Toilet: with modified independence;ambulating Pt Will Perform Toileting - Clothing Manipulation and hygiene: with modified independence;sitting/lateral leans;sit to/from stand Additional ADL Goal #1: Pt will engage in at least 8 min standing functional activities without loss of balance, in order to demonstrate improved activity tolerance and balance needed to perform ADLs safely at home.  OT Frequency: Min 2X/week   Barriers to D/C:    No known       Co-evaluation              AM-PAC OT "6 Clicks" Daily Activity     Outcome Measure Help from another person eating meals?: None Help from another person taking care of  personal grooming?: A Little Help from another person toileting, which includes using toliet, bedpan, or urinal?: A Little Help from another person bathing (including washing, rinsing, drying)?: A Little Help from another person to put on and taking off regular upper body clothing?: A Little Help from another person to put on and taking off regular lower body clothing?: A Little 6 Click Score: 19   End of Session Equipment Utilized During Treatment: Gait belt;Rolling walker (2 wheels) Nurse Communication: Other (comment) (CNA assisted with finding safety pin for JP  drain)  Activity Tolerance: Patient tolerated treatment well Patient left: in chair;with call bell/phone within reach;with family/visitor present  OT Visit Diagnosis: Unsteadiness on feet (R26.81);History of falling (Z91.81)                Time: 0045-9977 OT Time Calculation (min): 51 min Charges:  OT General Charges $OT Visit: 1 Visit OT Evaluation $OT Eval Low Complexity: 1 Low OT Treatments $Self Care/Home Management : 8-22 mins $Therapeutic Activity: 8-22 mins  Anderson Malta, OT Acute Rehab Services Office: [phone] 05/24/2021  Julien Girt 05/24/2021, 10:21 AM

## 2021-05-24 NOTE — TOC Progression Note (Addendum)
Transition of Care [facility]) - Progression Note    Patient Details  Name: Grace [name] MRN: [id number] Date of Birth: [birth year]  [facility] CM/SW Contact  Eviana Sibilia, Juliann Pulse, RN Phone Number: 05/24/2021, 12:49 PM  Clinical Narrative:  Attempted to discuss recc HHPT/OT with patient-she wants to discuss at a later time.  5p-[facility] for HHPT/OT-rep Tommi Rumps;[facility] dme-rw,3n1-to deliver to rm prior d/c.        Expected Discharge Plan and Services                                                 Social Determinants of Health (SDOH) Interventions    Readmission Risk Interventions No flowsheet data found.

## 2021-05-24 NOTE — Progress Notes (Signed)
Progress Note  Patient Name: Grace [name] Date of Encounter: 05/24/2021  [facility] Cardiologist:  Harrell Gave at [place],  [place] cardiology   Subjective   She was admitted with a hip fracture.  She was on Coumadin for mechanical AVR and mechanical MVR.  She had surgical repair of her hip yesterday.  Heparin is to start tonight  Inpatient Medications    Scheduled Meds:  Chlorhexidine Gluconate Cloth  6 each Topical Daily   cholecalciferol  1,000 Units Oral Daily   docusate sodium  100 mg Oral BID   ezetimibe  10 mg Oral QPM   levothyroxine  100 mcg Oral Q0600   mouth rinse  15 mL Mouth Rinse BID   metoprolol succinate  50 mg Oral BID   pantoprazole  40 mg Oral Daily   senna  1 tablet Oral BID   Warfarin - Pharmacist Dosing Inpatient   Does not apply q1600   Continuous Infusions:  PRN Meds: dextromethorphan-guaiFENesin, hydrALAZINE, ipratropium-albuterol, menthol-cetylpyridinium **OR** phenol, metoCLOPramide **OR** metoCLOPramide (REGLAN) injection, metoprolol tartrate, ondansetron **OR** ondansetron (ZOFRAN) IV, oxyCODONE, traZODone   Vital Signs    Vitals:   05/18/2021 2028 05/24/21 0041 05/24/21 1015 05/24/21 1140  BP: 110/61 117/61  139/69  Pulse: (!) 102 99 100 99  Resp: 18 18    Temp: 98.6 F (37 C) 98.1 F (36.7 C)    TempSrc: Oral Oral    SpO2: 95% 98%  92%  Weight:      Height:        Intake/Output Summary (Last 24 hours) at 05/24/2021 1225 Last data filed at 05/24/2021 0900 Gross per 24 hour  Intake 2120 ml  Output 1690 ml  Net 430 ml   Last 3 Weights 05/20/2021 04/30/2021 04/04/2021  Weight (lbs) 138 lb 3.7 oz 132 lb 8 oz 130 lb 9.6 oz  Weight (kg) 62.7 kg 60.102 kg 59.24 kg      Telemetry    Nsr - Personally Reviewed  ECG     - Personally Reviewed  Physical Exam   GEN:  elderly female  Neck: No JVD Cardiac: RRR, mechanical S1, S2,  valve sounds are crisp,   bilateral breast prosthesis Respiratory: Clear to auscultation  bilaterally. GI: Soft, nontender, non-distended  MS: No edema; No deformity. Neuro:  Nonfocal  Psych: Normal affect   Labs    High Sensitivity Troponin:  No results for input(s): TROPONINIHS in the last 720 hours.   Chemistry Recent Labs  Lab 05/06/2021 2147 05/20/21 0836 05/21/21 0432 05/22/21 0258 04/28/2021 0503 05/24/21 0446  NA 136 134*   < > 132* 130* 123*  K 4.0 4.3   < > 4.0 3.8 3.5  CL 102 102   < > 99 96* 90*  CO2 27 26   < > 22 25 23   GLUCOSE 95 172*   < > 102* 105* 140*  BUN 22 19   < > 15 15 23   CREATININE 1.04* 0.97   < > 0.99 0.71 0.89  CALCIUM 8.3* 8.1*   < > 7.5* 7.5* 6.5*  MG  --   --    < > 2.0 2.3 2.0  PROT 7.0 6.7  --   --   --   --   ALBUMIN 3.7 3.4*  --   --   --   --   AST 31 30  --   --   --   --   ALT 21 21  --   --   --   --  ALKPHOS 72 70  --   --   --   --   BILITOT 0.5 0.7  --   --   --   --   GFRNONAA 58* >60   < > >60 >60 >60  ANIONGAP 7 6   < > 11 9 10    < > = values in this interval not displayed.    Lipids No results for input(s): CHOL, TRIG, HDL, LABVLDL, LDLCALC, CHOLHDL in the last 168 hours.  Hematology Recent Labs  Lab 05/22/21 0258 05/21/2021 0503 05/24/21 0446  WBC 16.4* 14.8* 15.5*  RBC 3.63* 3.86* 3.24*  HGB 10.2* 10.8* 9.1*  HCT 32.5* 33.8* 27.9*  MCV 89.5 87.6 86.1  MCH 28.1 28.0 28.1  MCHC 31.4 32.0 32.6  RDW 15.6* 15.6* 15.2  PLT 387 376 335   Thyroid No results for input(s): TSH, FREET4 in the last 168 hours.  BNPNo results for input(s): BNP, PROBNP in the last 168 hours.  DDimer No results for input(s): DDIMER in the last 168 hours.   Radiology    Pelvis Portable  Result Date: 04/29/2021 CLINICAL DATA:  Post right total anterior hip EXAM: PORTABLE PELVIS 1-2 VIEWS COMPARISON:  05/16/2021 FINDINGS: Interval placement of a right total hip arthroplasty. Non cemented components appear well seated. Somewhat flattened acetabular cup angle. Skin clips, soft tissue gas, and soft tissue drain are consistent with recent  surgery. IMPRESSION: Interval right total hip arthroplasty. Components appear well seated. Electronically Signed   By: Lucienne Capers M.D.   On: 05/05/2021 17:55   DG C-Arm 1-60 Min-No Report  Result Date: 04/29/2021 Fluoroscopy was utilized by the requesting physician.  No radiographic interpretation.   DG C-Arm 1-60 Min-No Report  Result Date: 04/29/2021 Fluoroscopy was utilized by the requesting physician.  No radiographic interpretation.   DG HIP UNILAT WITH PELVIS 1V RIGHT  Result Date: 05/16/2021 CLINICAL DATA:  Right total anterior hip EXAM: DG HIP (WITH OR WITHOUT PELVIS) 1V RIGHT COMPARISON:  05/02/2021 FINDINGS: Intraoperative fluoroscopy is obtained for surgical control purposes. Fluoroscopy time is recorded at 14 seconds. Cumulative dose is 1.5128 mGy. Ten spot fluoroscopic images are obtained. Spot fluoroscopic images obtained initially demonstrate a mildly displaced right femoral neck fracture with varus angulation. Subsequent images demonstrate sizing and placement of a right total hip arthroplasty with non cemented components. Components appear well seated. Mildly flattened angle of the acetabular cup. IMPRESSION: Intraoperative fluoroscopy is obtained for surgical control purposes demonstrating placement of a right total hip arthroplasty for repair of a right femoral neck fracture. Electronically Signed   By: Lucienne Capers M.D.   On: 05/20/2021 17:55    Cardiac Studies     Patient Profile     70 y.o. female    [place]     1.  Mechanical aortic valve replacement and mechanical mitral valve replacement: She has been maintained on Coumadin.  She has been off heparin following her hip procedure.  She is to be restarted on heparin tonight.  Coumadin and heparin to be managed by pharmacy.  Restart Coumadin per orthopedic recommendations      For questions or updates, please contact [place] Please consult [email or web address] for contact info under         Signed, Mertie Moores, MD  05/24/2021, 12:25 PM

## 2021-05-24 NOTE — Progress Notes (Signed)
ANTICOAGULATION CONSULT NOTE - Follow Up Consult  Pharmacy Consult for warfarin Indication: Mechanical aortic valve, mechanical mitral valve, and afib  Allergies  Allergen Reactions   Digoxin Other (See Comments)    bradycardia   Statins     myalgia   Patient Measurements: Height: 5' 3.5" (161.3 cm) Weight: 62.7 kg (138 lb 3.7 oz) IBW/kg (Calculated) : 53.55  Vital Signs: Temp: 97.8 F (36.6 C) (12/29 1249) Temp Source: Oral (12/29 1249) BP: 122/62 (12/29 1249) Pulse Rate: 89 (12/29 1249)  Labs: Recent Labs    05/22/21 0258 05/22/21 1243 05/22/21 1436 05/22/21 2312 05/22/2021 0503 05/24/21 0446  HGB 10.2*  --   --   --  10.8* 9.1*  HCT 32.5*  --   --   --  33.8* 27.9*  PLT 387  --   --   --  376 335  LABPROT 14.6  --   --   --  14.5 14.8  INR 1.1  --   --   --  1.1 1.2  HEPARINUNFRC 0.11* 0.28* 0.27* 0.30  --   --   CREATININE 0.99  --   --   --  0.71 0.89    Estimated Creatinine Clearance: 49.8 mL/min (by C-G formula based on SCr of 0.89 mg/dL).  Assessment: 70 yo F presenting with right hip fracture after mechanical fall, pending possible right hip surgery. Patient on warfarin PTA (last dose 12/24) for atrial fibrillation and mechanical aortic and mechanical mitral valve replacement. On admission, warfarin was held and pt was given one dose of vitamin K in anticipation of surgical intervention. Pharmacy was consulted to dose heparin drip for pre-operative bridge therapy once INR <2.5.  Pharmacy now consulted to resume warfarin POD#0.   Home dose (confirmed with [facility] clinic note from 05/08/21): -Warfarin 2.5 mg MWF; 5 mg all other days -INR goal: 2.5 - 3.5   Significant Events: -12/25: Phytonadione 10 mg PO x1 dose -12/26: Heparin drip initiated for INR < 2.5 -12/28: Heparin stopped at 0600 prior to surgery  Today, 05/24/21 Pt underwent right total hip arthroplasty, anterior approach as well as nonoperative treatment of right sacral ala fracture today. AET 1649  on 12/28  Heparin drip for post-op bridging to begin @ 1800 on 05/24/21 with no bolus per Dr. Lyla Glassing. INR = 1.2 is subtherapeutic as expected s/p vitamin K and holding warfarin.  CBC: Hgb slightly low but stable; Plt WNL  Goal of Therapy:  Heparin level 0.3-0.7 units/ml Monitor platelets by anticoagulation protocol: Yes   Plan:  Warfarin 6 mg PO once this evening (~1.5x TDD) Starting at 1800, heparin 1400 units/hr with no bolus  Obtain HL 8 hours after start of infusion  CBC, INR with AM labs tomorrow Monitor for signs and symptoms of bleeding    Royetta Asal, PharmD, BCPS 05/24/2021 12:51 PM

## 2021-05-24 NOTE — Plan of Care (Addendum)
°  Problem: Education: Goal: Knowledge of General Education information will improve Description: Including pain rating scale, medication(s)/side effects and non-pharmacologic comfort measures Outcome: Progressing   Problem: Coping: Goal: Level of anxiety will decrease Outcome: Progressing   Problem: Elimination: Goal: Will not experience complications related to urinary retention Outcome: Progressing   Problem: Pain Managment: Goal: General experience of comfort will improve Outcome: Progressing   Problem: Safety: Goal: Ability to remain free from injury will improve Outcome: Progressing   Problem: Skin Integrity: Goal: Risk for impaired skin integrity will decrease Outcome: Progressing   Problem: Clinical Measurements: Goal: Postoperative complications will be avoided or minimized Outcome: Progressing   Problem: Pain Management: Goal: Pain level will decrease Outcome: Progressing

## 2021-05-24 NOTE — Progress Notes (Addendum)
° ° °  Subjective:  Patient reports pain as mild.  Denies N/V/CP/SOB.   Objective:   VITALS:   Vitals:   05/09/2021 1730 05/22/2021 1745 05/25/2021 2028 05/24/21 0041  BP: 105/69 122/61 110/61 117/61  Pulse: 99 99 (!) 102 99  Resp: 16 15 18 18   Temp:   98.6 F (37 C) 98.1 F (36.7 C)  TempSrc:   Oral Oral  SpO2: 97% 95% 95% 98%  Weight:      Height:        NAD ABD soft Neurovascular intact Sensation intact distally Intact pulses distally Dorsiflexion/Plantar flexion intact Incision: dressing C/D/I JP drain in place Quad strength on the right still weak but improved from preop  Lab Results  Component Value Date   WBC 15.5 (H) 05/24/2021   HGB 9.1 (L) 05/24/2021   HCT 27.9 (L) 05/24/2021   MCV 86.1 05/24/2021   PLT 335 05/24/2021   BMET    Component Value Date/Time   NA 123 (L) 05/24/2021 0446   K 3.5 05/24/2021 0446   CL 90 (L) 05/24/2021 0446   CO2 23 05/24/2021 0446   GLUCOSE 140 (H) 05/24/2021 0446   GLUCOSE 107 (H) 04/23/2006 0806   BUN 23 05/24/2021 0446   CREATININE 0.89 05/24/2021 0446   CREATININE 1.32 (H) 04/03/2020 0856   CALCIUM 6.5 (L) 05/24/2021 0446   GFRNONAA >60 05/24/2021 0446   GFRNONAA 41 (L) 04/03/2020 0856     Assessment/Plan: 1 Day Post-Op   Principal Problem:   Closed right femoral fracture (HCC) Active Problems:   Hypothyroidism   Hyperlipidemia   Congestive heart failure (HCC)   History of cardiovascular disorder   CKD (chronic kidney disease), stage III (HCC)   Atrial flutter (HCC)   Hx of long-term (current) use of anticoagulants   CAD (coronary artery disease)   WBAT with walker DVT ppx:  Restarting warfarin tonight , SCDs, TEDS PO pain control PT/OT Dispo: Clear for discharge from orthopedic standpoint once cleared by therapy. Plan for resumption of warfarin tonight at 6pm, dosing per pharmacy. JP drain to stay in place at discharge. Empty every 8 hours. F/U in 1 week for removal of drain.      Dorothyann Peng 05/24/2021, 7:58 AM   [facility] is now [facility] [employer] [address] [place], [place], [place] [address] Phone: [phone] [email or web address] Facebook   [employer]

## 2021-05-24 NOTE — Progress Notes (Addendum)
PROGRESS NOTE    Grace [name]  MBW:[id number] DOB: 03-01-1951 DOA: 05/17/2021 PCP: Marin Olp, MD   Brief Narrative:   70 year old female with past medical history of aortic valve replacement with mechanical valve on Coumadin, CKD stage IIIa, history of breast cancer, Hodgkin's disease, history of TIA presented to hospital after sustaining a mechanical fall.  She was noted to have right femoral fracture on the CT scan.  There was no mention of loss of consciousness prior to the fall.  Orthopedics was consulted.  Patient was initiated on heparin drip.  Patient  was seen by cardiology and a repeat echocardiogram was done which showed a dilated cardiomyopathy with ejection fraction of 55 to 60% with severe TR.   Assessment & Plan:   Principal Problem:   Closed right femoral fracture (HCC) Active Problems:   Hypothyroidism   Hyperlipidemia   Congestive heart failure (HCC)   History of cardiovascular disorder   CKD (chronic kidney disease), stage III (HCC)   Atrial flutter (HCC)   Hx of long-term (current) use of anticoagulants   CAD (coronary artery disease)   Mechanical fall causing displaced right femoral neck fracture status post right total hip arthroplasty on 04/26/2021 Patient was on Coumadin as outpatient.  Patient underwent right total hip arthroplasty on 05/11/2021.  Patient has been initiated on Coumadin and will be resumed on heparin drip from tonight as per orthopedics.  Physical therapy occupational therapy has been consulted.  Patient might need rehabilitation.  Nausea and vomiting Improved  Hyponatremia.  We will closely monitor.  Received few doses of IV Lasix.  Baseline sodium 5 days back was 136.  We will continue to monitor.  History of mechanical valve replacement, aortic and mitral Severe tricuspid regurgitation Patient had mechanical valve replacement including 2012.  On Coumadin as outpatient.  Coumadin has been resumed and heparin to be started  today.  Hypothyroidism Continue Synthroid  Mild CHF congestive heart failure reduced ejection fraction, EF 55% 2D echo january 2019 showing EF of 35%.  Repeat 2D echocardiogram this time showed EF of 55%, dilated cardiomyopathy, replaced valve noted.  Severe TR. patient is on Lasix Toprol-XL Aldactone at home.  Currently following.  Received 1 dose of IV Lasix yesterday.  Currently on 2 L of oxygen by nasal cannula.  CKD stage II Continue to monitor BMP.  Latest creatinine at 0.8  History of atrial fibrillation with flutter On Coumadin at home, Coumadin has been resumed.  Latest INR of 1.2  Coronary artery disease No active issues.  No chest pain.  Resume aspirin after surgery.  History of constrictive pericarditis - Status post pericardiectomy.  No active disease.  Hodgkin's lymphoma, History of breast cancer status post bilateral mastectomy and reconstruction - Status post radiation and chemo.  Patient follows up with Dr Alen Blew oncology as outpatient.  Iron deficiency anemia - Follows outpatient with oncology.  Latest hemoglobin of 8.1..  We will closely monitor   Chronic venous insufficiency - Follows outpatient vascular surgery Dr. Doren Custard as outpatient.  DVT prophylaxis: Coumadin,  Code Status: Full code  Family Communication:   I again spoke with the patient's husband at bedside  Status is: Inpatient  Remains inpatient appropriate because status post surgical intervention, need for possible rehabilitation, anticoagulation  Subjective: Today, patient was seen and examined at bedside.  Patient denies overt pain, shortness of breath, chest pain dizziness lightheadedness.  Complains of mild sore throat after surgical intervention   Physical Examination: General:  Average built, not in  obvious distress, on nasal cannula oxygen. HENT:   No scleral pallor or icterus noted. Oral mucosa is moist.  CVS: S1 &S2 heard.  Mechanical valve sounds noted. Abdomen: Soft, nontender,  nondistended.  Bowel sounds are heard.   Extremities: No cyanosis, clubbing or edema.  Peripheral pulses are palpable.  Right hip surgical site healthy with dressing and drain. Psych: Alert, awake and oriented, normal mood CNS:  No cranial nerve deficits.  Power equal in all extremities.   Skin: Warm and dry.  No rashes noted.   Objective: Vitals:   04/27/2021 1730 05/18/2021 1745 05/15/2021 2028 05/24/21 0041  BP: 105/69 122/61 110/61 117/61  Pulse: 99 99 (!) 102 99  Resp: 16 15 18 18   Temp:   98.6 F (37 C) 98.1 F (36.7 C)  TempSrc:   Oral Oral  SpO2: 97% 95% 95% 98%  Weight:      Height:        Intake/Output Summary (Last 24 hours) at 05/24/2021 0813 Last data filed at 05/24/2021 7035 Gross per 24 hour  Intake 1100 ml  Output 1690 ml  Net -590 ml    Filed Weights   05/20/21 1122  Weight: 62.7 kg    Data Reviewed:   CBC: Recent Labs  Lab 04/26/2021 2147 05/20/21 0836 05/21/21 0432 05/22/21 0258 05/11/2021 0503 05/24/21 0446  WBC 13.8* 21.1* 17.7* 16.4* 14.8* 15.5*  NEUTROABS 10.9*  --   --   --   --   --   HGB 11.7* 11.1* 10.7* 10.2* 10.8* 9.1*  HCT 38.2 35.3* 33.8* 32.5* 33.8* 27.9*  MCV 92.0 90.3 90.1 89.5 87.6 86.1  PLT 501* 455* 400 387 376 009    Basic Metabolic Panel: Recent Labs  Lab 05/20/21 0836 05/21/21 0432 05/22/21 0258 05/06/2021 0503 05/24/21 0446  NA 134* 132* 132* 130* 123*  K 4.3 4.6 4.0 3.8 3.5  CL 102 100 99 96* 90*  CO2 26 21* 22 25 23   GLUCOSE 172* 110* 102* 105* 140*  BUN 19 16 15 15 23   CREATININE 0.97 0.94 0.99 0.71 0.89  CALCIUM 8.1* 7.7* 7.5* 7.5* 6.5*  MG  --  2.1 2.0 2.3 2.0    GFR: Estimated Creatinine Clearance: 49.8 mL/min (by C-G formula based on SCr of 0.89 mg/dL). Liver Function Tests: Recent Labs  Lab 05/08/2021 2147 05/20/21 0836  AST 31 30  ALT 21 21  ALKPHOS 72 70  BILITOT 0.5 0.7  PROT 7.0 6.7  ALBUMIN 3.7 3.4*    No results for input(s): LIPASE, AMYLASE in the last 168 hours. No results for input(s):  AMMONIA in the last 168 hours. Coagulation Profile: Recent Labs  Lab 05/20/21 1107 05/21/21 0432 05/22/21 0258 05/15/2021 0503 05/24/21 0446  INR 3.1* 2.4* 1.1 1.1 1.2    Cardiac Enzymes: No results for input(s): CKTOTAL, CKMB, CKMBINDEX, TROPONINI in the last 168 hours. BNP (last 3 results) No results for input(s): PROBNP in the last 8760 hours. HbA1C: No results for input(s): HGBA1C in the last 72 hours. CBG: No results for input(s): GLUCAP in the last 168 hours. Lipid Profile: No results for input(s): CHOL, HDL, LDLCALC, TRIG, CHOLHDL, LDLDIRECT in the last 72 hours. Thyroid Function Tests: No results for input(s): TSH, T4TOTAL, FREET4, T3FREE, THYROIDAB in the last 72 hours. Anemia Panel: No results for input(s): VITAMINB12, FOLATE, FERRITIN, TIBC, IRON, RETICCTPCT in the last 72 hours. Sepsis Labs: No results for input(s): PROCALCITON, LATICACIDVEN in the last 168 hours.  Recent Results (from the past 240 hour(s))  Resp Panel by RT-PCR (Flu A&B, Covid) Nasopharyngeal Swab     Status: None   Collection Time: 05/20/21 12:02 AM   Specimen: Nasopharyngeal Swab; Nasopharyngeal(NP) swabs in vial transport medium  Result Value Ref Range Status   SARS Coronavirus 2 by RT PCR NEGATIVE NEGATIVE Final    Comment: (NOTE) SARS-CoV-2 target nucleic acids are NOT DETECTED.  The SARS-CoV-2 RNA is generally detectable in upper respiratory specimens during the acute phase of infection. The lowest concentration of SARS-CoV-2 viral copies this assay can detect is 138 copies/mL. A negative result does not preclude SARS-Cov-2 infection and should not be used as the sole basis for treatment or other patient management decisions. A negative result may occur with  improper specimen collection/handling, submission of specimen other than nasopharyngeal swab, presence of viral mutation(s) within the areas targeted by this assay, and inadequate number of viral copies(<138 copies/mL). A negative  result must be combined with clinical observations, patient history, and epidemiological information. The expected result is Negative.  Fact Sheet for Patients:  EntrepreneurPulse.com.au  Fact Sheet for Healthcare Providers:  IncredibleEmployment.be  This test is no t yet approved or cleared by the [place] FDA and  has been authorized for detection and/or diagnosis of SARS-CoV-2 by FDA under an Emergency Use Authorization (EUA). This EUA will remain  in effect (meaning this test can be used) for the duration of the COVID-19 declaration under Section 564(b)(1) of the Act, 21 U.S.C.section 360bbb-3(b)(1), unless the authorization is terminated  or revoked sooner.       Influenza A by PCR NEGATIVE NEGATIVE Final   Influenza B by PCR NEGATIVE NEGATIVE Final    Comment: (NOTE) The Xpert Xpress SARS-CoV-2/FLU/RSV plus assay is intended as an aid in the diagnosis of influenza from Nasopharyngeal swab specimens and should not be used as a sole basis for treatment. Nasal washings and aspirates are unacceptable for Xpert Xpress SARS-CoV-2/FLU/RSV testing.  Fact Sheet for Patients: EntrepreneurPulse.com.au  Fact Sheet for Healthcare Providers: IncredibleEmployment.be  This test is not yet approved or cleared by the [place] FDA and has been authorized for detection and/or diagnosis of SARS-CoV-2 by FDA under an Emergency Use Authorization (EUA). This EUA will remain in effect (meaning this test can be used) for the duration of the COVID-19 declaration under Section 564(b)(1) of the Act, 21 U.S.C. section 360bbb-3(b)(1), unless the authorization is terminated or revoked.  Performed at [facility], [place] [address] [place], [place] [address]   Surgical PCR screen     Status: None   Collection Time: 05/09/2021  2:28 AM   Specimen: Nasal Mucosa; Nasal Swab  Result Value Ref Range Status   MRSA, PCR  NEGATIVE NEGATIVE Final   Staphylococcus aureus NEGATIVE NEGATIVE Final    Comment: (NOTE) The Xpert SA Assay (FDA approved for NASAL specimens in patients 56 years of age and older), is one component of a comprehensive surveillance program. It is not intended to diagnose infection nor to guide or monitor treatment. Performed at [facility], [place] [address] [place], [place] [address]        Radiology Studies: Pelvis Portable  Result Date: 05/11/2021 CLINICAL DATA:  Post right total anterior hip EXAM: PORTABLE PELVIS 1-2 VIEWS COMPARISON:  05/06/2021 FINDINGS: Interval placement of a right total hip arthroplasty. Non cemented components appear well seated. Somewhat flattened acetabular cup angle. Skin clips, soft tissue gas, and soft tissue drain are consistent with recent surgery. IMPRESSION: Interval right total hip arthroplasty. Components appear well  seated. Electronically Signed   By: Lucienne Capers M.D.   On: 05/11/2021 17:55   DG C-Arm 1-60 Min-No Report  Result Date: 04/26/2021 Fluoroscopy was utilized by the requesting physician.  No radiographic interpretation.   DG C-Arm 1-60 Min-No Report  Result Date: 05/12/2021 Fluoroscopy was utilized by the requesting physician.  No radiographic interpretation.   DG HIP UNILAT WITH PELVIS 1V RIGHT  Result Date: 05/16/2021 CLINICAL DATA:  Right total anterior hip EXAM: DG HIP (WITH OR WITHOUT PELVIS) 1V RIGHT COMPARISON:  05/02/2021 FINDINGS: Intraoperative fluoroscopy is obtained for surgical control purposes. Fluoroscopy time is recorded at 14 seconds. Cumulative dose is 1.5128 mGy. Ten spot fluoroscopic images are obtained. Spot fluoroscopic images obtained initially demonstrate a mildly displaced right femoral neck fracture with varus angulation. Subsequent images demonstrate sizing and placement of a right total hip arthroplasty with non cemented components. Components appear well seated. Mildly flattened  angle of the acetabular cup. IMPRESSION: Intraoperative fluoroscopy is obtained for surgical control purposes demonstrating placement of a right total hip arthroplasty for repair of a right femoral neck fracture. Electronically Signed   By: Lucienne Capers M.D.   On: 05/22/2021 17:55       Scheduled Meds:  Chlorhexidine Gluconate Cloth  6 each Topical Daily   cholecalciferol  1,000 Units Oral Daily   docusate sodium  100 mg Oral BID   ezetimibe  10 mg Oral QPM   levothyroxine  100 mcg Oral Q0600   metoprolol succinate  50 mg Oral BID   pantoprazole  40 mg Oral Daily   senna  1 tablet Oral BID   Warfarin - Pharmacist Dosing Inpatient   Does not apply q1600   Continuous Infusions:    LOS: 5 days    Flora Lipps, MD [facility] If 7PM-7AM, please contact night-coverage 05/24/2021, 8:13 AM

## 2021-05-24 NOTE — Care Management Important Message (Signed)
Important Message  Patient Details IM Letter given to the Patient. Name: Grace [name] MRN: [id number] Date of Birth: 06/21/1950   Medicare Important Message Given:  Yes     Kerin Salen 05/24/2021, 11:20 AM

## 2021-05-25 DIAGNOSIS — Z952 Presence of prosthetic heart valve: Secondary | ICD-10-CM | POA: Diagnosis not present

## 2021-05-25 DIAGNOSIS — Z9229 Personal history of other drug therapy: Secondary | ICD-10-CM | POA: Diagnosis not present

## 2021-05-25 LAB — BASIC METABOLIC PANEL
Anion gap: 8 (ref 5–15)
BUN: 19 mg/dL (ref 8–23)
CO2: 27 mmol/L (ref 22–32)
Calcium: 7.1 mg/dL — ABNORMAL LOW (ref 8.9–10.3)
Chloride: 92 mmol/L — ABNORMAL LOW (ref 98–111)
Creatinine, Ser: 0.7 mg/dL (ref 0.44–1.00)
GFR, Estimated: >60 mL/min (ref >60)
Glucose, Bld: 107 mg/dL — ABNORMAL HIGH (ref 70–99)
Potassium: 3.6 mmol/L (ref 3.5–5.1)
Sodium: 127 mmol/L — ABNORMAL LOW (ref 135–145)

## 2021-05-25 LAB — CBC
HCT: 28 % — ABNORMAL LOW (ref 36.0–46.0)
Hemoglobin: 8.8 g/dL — ABNORMAL LOW (ref 12.0–15.0)
MCH: 27.6 pg (ref 26.0–34.0)
MCHC: 31.4 g/dL (ref 30.0–36.0)
MCV: 87.8 fL (ref 80.0–100.0)
Platelets: 325 10*3/uL (ref 150–400)
RBC: 3.19 MIL/uL — ABNORMAL LOW (ref 3.87–5.11)
RDW: 15.2 % (ref 11.5–15.5)
WBC: 13.8 10*3/uL — ABNORMAL HIGH (ref 4.0–10.5)
nRBC: 0.7 % — ABNORMAL HIGH (ref 0.0–0.2)

## 2021-05-25 LAB — PROTIME-INR
INR: 1.4 — ABNORMAL HIGH (ref 0.8–1.2)
Prothrombin Time: 16.7 seconds — ABNORMAL HIGH (ref 11.4–15.2)

## 2021-05-25 LAB — MAGNESIUM: Magnesium: 2.3 mg/dL (ref 1.7–2.4)

## 2021-05-25 LAB — HEPARIN LEVEL (UNFRACTIONATED)
Heparin Unfractionated: 0.28 IU/mL — ABNORMAL LOW (ref 0.30–0.70)
Heparin Unfractionated: 0.4 IU/mL (ref 0.30–0.70)

## 2021-05-25 LAB — GLUCOSE, CAPILLARY: Glucose-Capillary: 111 mg/dL — ABNORMAL HIGH (ref 70–99)

## 2021-05-25 LAB — PHOSPHORUS: Phosphorus: 1.2 mg/dL — ABNORMAL LOW (ref 2.5–4.6)

## 2021-05-25 MED ORDER — POTASSIUM CHLORIDE CRYS ER 20 MEQ PO TBCR
40.0000 meq | EXTENDED_RELEASE_TABLET | Freq: Once | ORAL | Status: AC
  Administered 2021-05-25: 14:00:00 40 meq via ORAL
  Filled 2021-05-25: qty 2

## 2021-05-25 MED ORDER — K PHOS MONO-SOD PHOS DI & MONO 155-852-130 MG PO TABS
500.0000 mg | ORAL_TABLET | Freq: Three times a day (TID) | ORAL | Status: DC
  Administered 2021-05-25 – 2021-05-28 (×12): 500 mg via ORAL
  Filled 2021-05-25 (×14): qty 2

## 2021-05-25 MED ORDER — FUROSEMIDE 40 MG PO TABS
40.0000 mg | ORAL_TABLET | Freq: Every day | ORAL | Status: DC
  Administered 2021-05-25 – 2021-05-28 (×4): 40 mg via ORAL
  Filled 2021-05-25 (×4): qty 1

## 2021-05-25 MED ORDER — WARFARIN SODIUM 5 MG PO TABS
5.0000 mg | ORAL_TABLET | Freq: Once | ORAL | Status: AC
  Administered 2021-05-25: 18:00:00 5 mg via ORAL
  Filled 2021-05-25: qty 1

## 2021-05-25 NOTE — Progress Notes (Signed)
PROGRESS NOTE    Grace [name]  WIO:[id number] DOB: [birth year] DOA: 05/11/2021 PCP: Marin Olp, MD   Brief Narrative:   70 year old female with past medical history of aortic valve replacement with mechanical valve on Coumadin, CKD stage IIIa, history of breast cancer, Hodgkin's disease, history of TIA presented to hospital after sustaining a mechanical fall.  She was noted to have right femoral fracture on the CT scan.  There was no mention of loss of consciousness prior to the fall.  Orthopedics was consulted.  Patient was initiated on heparin drip.  Patient  was seen by cardiology and a repeat echocardiogram was done which showed a dilated cardiomyopathy with ejection fraction of 55 to 60% with severe TR.   Assessment & Plan:   Principal Problem:   Closed right femoral fracture (HCC) Active Problems:   Hypothyroidism   Hyperlipidemia   Congestive heart failure (HCC)   History of cardiovascular disorder   CKD (chronic kidney disease), stage III (HCC)   Atrial flutter (HCC)   Hx of long-term (current) use of anticoagulants   CAD (coronary artery disease)   Mechanical fall causing displaced right femoral neck fracture status post right total hip arthroplasty on 05/05/2021   Patient underwent right total hip arthroplasty on 05/15/2021.   Physical therapy occupational therapy recommended home health on discharge.  Currently on heparin drip and Coumadin.  Latest INR of 1.4   Nausea and vomiting Improved  Hyponatremia.  Latest sodium of 127.  Baseline sodium 5 days back was 136.  We will continue to monitor.  Had received few doses of IV Lasix during hospitalization  History of mechanical valve replacement, aortic and mitral Severe tricuspid regurgitation Patient had mechanical valve replacement including 2012.  On Coumadin as outpatient.  Continue Coumadin and heparin.  Hypophosphatemia.  We will replace with K-Phos.  Check levels in a.m.  Hypothyroidism Continue  Synthroid  Mild CHF congestive heart failure reduced ejection fraction, EF 55% 2D echo january 2019 showing EF of 35%.  Repeat 2D echocardiogram this time showed EF of 55%, dilated cardiomyopathy, replaced valve noted.  Severe TR. patient is on Lasix Toprol-XL, Aldactone at home.  Currently following.  Received 1 dose of IV Lasix yesterday.  Currently on 2 L of oxygen by nasal cannula.  Will resume oral Lasix from home.  CKD stage II Continue to monitor BMP.  Latest creatinine at 0.8  History of atrial fibrillation with flutter On Coumadin at home, Coumadin has been resumed.  Latest INR of 1.2  Coronary artery disease No active issues.  No chest pain.  Resume aspirin after surgery.  History of constrictive pericarditis - Status post pericardiectomy.  No active disease.  Hodgkin's lymphoma, History of breast cancer status post bilateral mastectomy and reconstruction - Status post radiation and chemo.  Patient follows up with Dr Alen Blew oncology as outpatient.  Iron deficiency anemia - Follows outpatient with oncology.  Latest hemoglobin of 8.8.  We will closely monitor   Chronic venous insufficiency - Follows outpatient vascular surgery Dr. Doren Custard as outpatient.  DVT prophylaxis: Coumadin and heparin drip   Code Status: Full code  Family Communication:   I  spoke with the patient's husband at bedside on 05/24/2021  Status is: Inpatient  Remains inpatient appropriate because status post surgical intervention, IV heparin drip and Coumadin anticoagulation, awaiting for therapeutic INR  Subjective: Today, patient was seen and examined at bedside.  Denies overt pain, nausea or vomiting.  Has mild discomfort.  Complains of mild weakness  Physical Examination: General: Thinly built, not in obvious distress, on nasal cannula oxygen HENT:   No scleral pallor or icterus noted. Oral mucosa is moist.  Chest:  Clear breath sounds.  Diminished breath sounds bilaterally. No crackles or  wheezes.  CVS: S1 &S2 heard. No murmur.  Regular rate and rhythm.  Medical valve sound noted Abdomen: Soft, nontender, nondistended.  Bowel sounds are heard.   Extremities: No cyanosis, clubbing or edema.  Peripheral pulses are palpable.  Right hip surgical site healthy with dressing and drain Psych: Alert, awake and oriented, normal mood CNS:  No cranial nerve deficits.  Power equal in all extremities.   Skin: Warm and dry.  No rashes noted.  Objective: Vitals:   05/24/21 2041 05/25/21 0230 05/25/21 0439 05/25/21 1039  BP: 120/67  (!) 102/51   Pulse: 90  91   Resp: 20  20   Temp: 98.1 F (36.7 C)  98.4 F (36.9 C)   TempSrc: Oral  Oral   SpO2: 95% 100% 96% 95%  Weight:   63 kg   Height:        Intake/Output Summary (Last 24 hours) at 05/25/2021 1121 Last data filed at 05/25/2021 0700 Gross per 24 hour  Intake 577.17 ml  Output 1840 ml  Net -1262.83 ml    Filed Weights   05/20/21 1122 05/25/21 0439  Weight: 62.7 kg 63 kg    Data Reviewed:   CBC: Recent Labs  Lab 05/08/2021 2147 05/20/21 0836 05/21/21 0432 05/22/21 0258 05/07/2021 0503 05/24/21 0446 05/25/21 0502  WBC 13.8*   < > 17.7* 16.4* 14.8* 15.5* 13.8*  NEUTROABS 10.9*  --   --   --   --   --   --   HGB 11.7*   < > 10.7* 10.2* 10.8* 9.1* 8.8*  HCT 38.2   < > 33.8* 32.5* 33.8* 27.9* 28.0*  MCV 92.0   < > 90.1 89.5 87.6 86.1 87.8  PLT 501*   < > 400 387 376 335 325   < > = values in this interval not displayed.    Basic Metabolic Panel: Recent Labs  Lab 05/21/21 0432 05/22/21 0258 04/30/2021 0503 05/24/21 0446 05/25/21 0502  NA 132* 132* 130* 123* 127*  K 4.6 4.0 3.8 3.5 3.6  CL 100 99 96* 90* 92*  CO2 21* 22 25 23 27   GLUCOSE 110* 102* 105* 140* 107*  BUN 16 15 15 23 19   CREATININE 0.94 0.99 0.71 0.89 0.70  CALCIUM 7.7* 7.5* 7.5* 6.5* 7.1*  MG 2.1 2.0 2.3 2.0 2.3  PHOS  --   --   --   --  1.2*    GFR: Estimated Creatinine Clearance: 55.4 mL/min (by C-G formula based on SCr of 0.7  mg/dL). Liver Function Tests: Recent Labs  Lab 05/08/2021 2147 05/20/21 0836  AST 31 30  ALT 21 21  ALKPHOS 72 70  BILITOT 0.5 0.7  PROT 7.0 6.7  ALBUMIN 3.7 3.4*    No results for input(s): LIPASE, AMYLASE in the last 168 hours. No results for input(s): AMMONIA in the last 168 hours. Coagulation Profile: Recent Labs  Lab 05/21/21 0432 05/22/21 0258 04/29/2021 0503 05/24/21 0446 05/25/21 0502  INR 2.4* 1.1 1.1 1.2 1.4*    Cardiac Enzymes: No results for input(s): CKTOTAL, CKMB, CKMBINDEX, TROPONINI in the last 168 hours. BNP (last 3 results) No results for input(s): PROBNP in the last 8760 hours. HbA1C: No results for input(s): HGBA1C in the last 72 hours. CBG:  No results for input(s): GLUCAP in the last 168 hours. Lipid Profile: No results for input(s): CHOL, HDL, LDLCALC, TRIG, CHOLHDL, LDLDIRECT in the last 72 hours. Thyroid Function Tests: No results for input(s): TSH, T4TOTAL, FREET4, T3FREE, THYROIDAB in the last 72 hours. Anemia Panel: No results for input(s): VITAMINB12, FOLATE, FERRITIN, TIBC, IRON, RETICCTPCT in the last 72 hours. Sepsis Labs: No results for input(s): PROCALCITON, LATICACIDVEN in the last 168 hours.  Recent Results (from the past 240 hour(s))  Resp Panel by RT-PCR (Flu A&B, Covid) Nasopharyngeal Swab     Status: None   Collection Time: 05/20/21 12:02 AM   Specimen: Nasopharyngeal Swab; Nasopharyngeal(NP) swabs in vial transport medium  Result Value Ref Range Status   SARS Coronavirus 2 by RT PCR NEGATIVE NEGATIVE Final    Comment: (NOTE) SARS-CoV-2 target nucleic acids are NOT DETECTED.  The SARS-CoV-2 RNA is generally detectable in upper respiratory specimens during the acute phase of infection. The lowest concentration of SARS-CoV-2 viral copies this assay can detect is 138 copies/mL. A negative result does not preclude SARS-Cov-2 infection and should not be used as the sole basis for treatment or other patient management decisions. A  negative result may occur with  improper specimen collection/handling, submission of specimen other than nasopharyngeal swab, presence of viral mutation(s) within the areas targeted by this assay, and inadequate number of viral copies(<138 copies/mL). A negative result must be combined with clinical observations, patient history, and epidemiological information. The expected result is Negative.  Fact Sheet for Patients:  EntrepreneurPulse.com.au  Fact Sheet for Healthcare Providers:  IncredibleEmployment.be  This test is no t yet approved or cleared by the [place] FDA and  has been authorized for detection and/or diagnosis of SARS-CoV-2 by FDA under an Emergency Use Authorization (EUA). This EUA will remain  in effect (meaning this test can be used) for the duration of the COVID-19 declaration under Section 564(b)(1) of the Act, 21 U.S.C.section 360bbb-3(b)(1), unless the authorization is terminated  or revoked sooner.       Influenza A by PCR NEGATIVE NEGATIVE Final   Influenza B by PCR NEGATIVE NEGATIVE Final    Comment: (NOTE) The Xpert Xpress SARS-CoV-2/FLU/RSV plus assay is intended as an aid in the diagnosis of influenza from Nasopharyngeal swab specimens and should not be used as a sole basis for treatment. Nasal washings and aspirates are unacceptable for Xpert Xpress SARS-CoV-2/FLU/RSV testing.  Fact Sheet for Patients: EntrepreneurPulse.com.au  Fact Sheet for Healthcare Providers: IncredibleEmployment.be  This test is not yet approved or cleared by the [place] FDA and has been authorized for detection and/or diagnosis of SARS-CoV-2 by FDA under an Emergency Use Authorization (EUA). This EUA will remain in effect (meaning this test can be used) for the duration of the COVID-19 declaration under Section 564(b)(1) of the Act, 21 U.S.C. section 360bbb-3(b)(1), unless the authorization  is terminated or revoked.  Performed at [facility], [place] [address], [place], [place] [address]   Surgical PCR screen     Status: None   Collection Time: 05/17/2021  2:28 AM   Specimen: Nasal Mucosa; Nasal Swab  Result Value Ref Range Status   MRSA, PCR NEGATIVE NEGATIVE Final   Staphylococcus aureus NEGATIVE NEGATIVE Final    Comment: (NOTE) The Xpert SA Assay (FDA approved for NASAL specimens in patients 54 years of age and older), is one component of a comprehensive surveillance program. It is not intended to diagnose infection nor to guide or monitor treatment. Performed at [facility], [place] [address] [place], [place] [address]        Radiology Studies: Pelvis Portable  Result Date: 05/02/2021 CLINICAL DATA:  Post right total anterior hip EXAM: PORTABLE PELVIS 1-2 VIEWS COMPARISON:  05/05/2021 FINDINGS: Interval placement of a right total hip arthroplasty. Non cemented components appear well seated. Somewhat flattened acetabular cup angle. Skin clips, soft tissue gas, and soft tissue drain are consistent with recent surgery. IMPRESSION: Interval right total hip arthroplasty. Components appear well seated. Electronically Signed   By: Lucienne Capers M.D.   On: 05/26/2021 17:55   DG C-Arm 1-60 Min-No Report  Result Date: 05/20/2021 Fluoroscopy was utilized by the requesting physician.  No radiographic interpretation.   DG C-Arm 1-60 Min-No Report  Result Date: 05/04/2021 Fluoroscopy was utilized by the requesting physician.  No radiographic interpretation.   DG HIP UNILAT WITH PELVIS 1V RIGHT  Result Date: 05/18/2021 CLINICAL DATA:  Right total anterior hip EXAM: DG HIP (WITH OR WITHOUT PELVIS) 1V RIGHT COMPARISON:  05/24/2021 FINDINGS: Intraoperative fluoroscopy is obtained for surgical control purposes. Fluoroscopy time is recorded at 14 seconds. Cumulative dose is 1.5128 mGy. Ten spot fluoroscopic images are obtained. Spot fluoroscopic images  obtained initially demonstrate a mildly displaced right femoral neck fracture with varus angulation. Subsequent images demonstrate sizing and placement of a right total hip arthroplasty with non cemented components. Components appear well seated. Mildly flattened angle of the acetabular cup. IMPRESSION: Intraoperative fluoroscopy is obtained for surgical control purposes demonstrating placement of a right total hip arthroplasty for repair of a right femoral neck fracture. Electronically Signed   By: Lucienne Capers M.D.   On: 05/15/2021 17:55       Scheduled Meds:  Chlorhexidine Gluconate Cloth  6 each Topical Daily   cholecalciferol  1,000 Units Oral Daily   docusate sodium  100 mg Oral BID   ezetimibe  10 mg Oral QPM   levothyroxine  100 mcg Oral Q0600   mouth rinse  15 mL Mouth Rinse BID   metoprolol succinate  50 mg Oral BID   pantoprazole  40 mg Oral Daily   phosphorus  500 mg Oral TID   potassium chloride  40 mEq Oral Once   senna  1 tablet Oral BID   warfarin  5 mg Oral ONCE-1600   Warfarin - Pharmacist Dosing Inpatient   Does not apply q1600   Continuous Infusions:    LOS: 6 days    Flora Lipps, MD [facility] If 7PM-7AM, please contact night-coverage 05/25/2021, 11:21 AM

## 2021-05-25 NOTE — TOC Progression Note (Signed)
Transition of Care [facility]) - Progression Note    Patient Details  Name: Grace [name] MRN: [id number] Date of Birth: [birth year]  Transition of Care [facility]) CM/SW Contact  Korinne Greenstein, Juliann Pulse, RN Phone Number: 05/25/2021, 10:00 AM  Clinical Narrative: Alvis Lemmings HHPT/OT_rep Tommi Rumps;[employer] for rw,3n1-patient agreed.      Expected Discharge Plan: [place] Barriers to Discharge: Continued Medical Work up  Expected Discharge Plan and Services Expected Discharge Plan: [place]   Discharge Planning Services: CM Consult                     DME Arranged: 3-N-1, [place] rolling DME Agency: [employer] Date DME Agency Contacted: 05/25/21 Time DME Agency Contacted: [phone] Representative spoke with at DME Agency: Andee Poles HH Arranged: PT, OT [place] Agency: [place] Date [place]: 05/25/21 Time [place]: [phone] Representative spoke with at [place]: [place] ([place]) Interventions    Readmission Risk Interventions No flowsheet data found.

## 2021-05-25 NOTE — Progress Notes (Signed)
ANTICOAGULATION CONSULT NOTE - Follow Up Consult  Pharmacy Consult for warfarin Indication: Mechanical aortic valve, mechanical mitral valve, and afib  Allergies  Allergen Reactions   Digoxin Other (See Comments)    bradycardia   Statins     myalgia   Patient Measurements: Height: 5' 3.5" (161.3 cm) Weight: 63 kg (138 lb 14.2 oz) IBW/kg (Calculated) : 53.55  Vital Signs: Temp: 98.4 F (36.9 C) (12/30 0439) Temp Source: Oral (12/30 0439) BP: 102/51 (12/30 0439) Pulse Rate: 91 (12/30 0439)  Labs: Recent Labs    05/22/21 1436 05/22/21 2312 05/01/2021 0503 05/10/2021 0503 05/24/21 0446 05/25/21 0502 05/25/21 0833  HGB  --   --  10.8*   < > 9.1* 8.8*  --   HCT  --   --  33.8*  --  27.9* 28.0*  --   PLT  --   --  376  --  335 325  --   LABPROT  --   --  14.5  --  14.8 16.7*  --   INR  --   --  1.1  --  1.2 1.4*  --   HEPARINUNFRC 0.27* 0.30  --   --   --   --  0.28*  CREATININE  --   --  0.71  --  0.89 0.70  --    < > = values in this interval not displayed.    Estimated Creatinine Clearance: 55.4 mL/min (by C-G formula based on SCr of 0.7 mg/dL).  Assessment: 70 yo F presenting with right hip fracture after mechanical fall, pending possible right hip surgery. Patient on warfarin PTA (last dose 12/24) for atrial fibrillation and mechanical aortic and mechanical mitral valve replacement. On admission, warfarin was held and pt was given one dose of vitamin K in anticipation of surgical intervention. Pharmacy was consulted to dose heparin drip for pre-operative bridge therapy once INR <2.5.  Pharmacy now consulted to resume warfarin POD#0.   Home dose (confirmed with [facility] clinic note from 05/08/21): -Warfarin 2.5 mg MWF; 5 mg all other days -INR goal: 2.5 - 3.5   Significant Events: -12/25: Phytonadione 10 mg PO x1 dose -12/26: Heparin drip initiated for INR < 2.5 -12/28: Heparin stopped at 0600 prior to surgery  Today, 05/25/21 Pt underwent right total hip arthroplasty,  anterior approach as well as nonoperative treatment of right sacral ala fracture today. AET 1649 on 12/28  Heparin drip for post-op bridging to begin @ 1800 on 05/24/21 with no bolus per Dr. Lyla Glassing. INR = 1.4 is subtherapeutic as expected s/p vitamin K and holding warfarin but increasing CBC: Hgb slightly low but stable; Plt WNL No bleeding per RN, however IV was pulled overnight for unknown time and unknown duration though it was running when first shift arrived and looks to be pulled at 0200 and resumed around 0300 so level should be accurate  Goal of Therapy:  Heparin level 0.3-0.7 units/ml Monitor platelets by anticoagulation protocol: Yes   Plan:  Warfarin 5 mg PO once this evening  Increase heparin infusion to 1500 ml/hr Obtain HL 6 hours after rate change  CBC, INR with AM labs tomorrow Monitor for signs and symptoms of bleeding  Ulice Dash, PharmD  05/25/2021 9:19 AM

## 2021-05-25 NOTE — Progress Notes (Signed)
Physical Therapy Treatment Patient Details Name: Grace [name] MRN: [id number] DOB: July 30, 1950 Today's Date: 05/25/2021   History of Present Illness 70 year female presenting with Displaced Right femoral neck fracture and Nondisplaced right sacral ala fracture after a fall on 05/12/2021. Pt underwent Right total hip arthroplasty, anterior approach on 05/16/2021. Nonoperative treatment of right sacral ala fracture.   PMHx: kyphoplasty of T6, T7 on 12/09/20, cardiac valvular disease, coumadin, breast CA, CKD, CHF, CAD, Hodgkins disease, mitral Valve replacement, migraines, pericarditis, TIA, Wolff Parkinson's syndrome.  Subacute T8 compression fracture (per chart, around 12/5) as well, but no orders for brace.    PT Comments    Pt slowly progressing with goals.  Pt's spouse concerned about his ability to assist pt upon d/c.  Pt and spouse agreeable to consider SNF at this time so updated d/c recommendations.   Recommendations for follow up therapy are one component of a multi-disciplinary discharge planning process, led by the attending physician.  Recommendations may be updated based on patient status, additional functional criteria and insurance authorization.  Follow Up Recommendations  Skilled nursing-short term rehab (<3 hours/day)     Assistance Recommended at Discharge Frequent or constant Supervision/Assistance  Equipment Recommendations  Rolling walker (2 wheels);BSC/3in1    Recommendations for Other Services       Precautions / Restrictions Precautions Precautions: Fall Restrictions Weight Bearing Restrictions: No Other Position/Activity Restrictions: WBAT with walker     Mobility  Bed Mobility Overal bed mobility: Needs Assistance Bed Mobility: Supine to Sit;Sit to Supine     Supine to sit: Min assist;HOB elevated Sit to supine: Min assist;HOB elevated   General bed mobility comments: verbal cues for self assist, required assist for R LE    Transfers Overall  transfer level: Needs assistance Equipment used: Rolling walker (2 wheels) Transfers: Sit to/from Stand Sit to Stand: Min assist           General transfer comment: verbal cues for UE and LE positioning, pt required assist to rise and then control descent    Ambulation/Gait Ambulation/Gait assistance: Min assist;Min guard Gait Distance (Feet): 20 Feet Assistive device: Rolling walker (2 wheels) Gait Pattern/deviations: Step-to pattern;Decreased stance time - right;Antalgic Gait velocity: decr     General Gait Details: pt with difficulty advancing Rt LE, cues for sequence and use of RW to assist with weight bearing, increased time and effort, pt became dizzy however able to ambulated back to bed; vitals obtained by nurse tech and in docflowsheets, Spo2 87% on room air upon return to room however improved to 92% prior to pt returning to supine   Stairs             Wheelchair Mobility    Modified Rankin (Stroke Patients Only)       Balance                                            Cognition Arousal/Alertness: Awake/alert Behavior During Therapy: WFL for tasks assessed/performed Overall Cognitive Status: Within Functional Limits for tasks assessed                                          Exercises      General Comments        Pertinent Vitals/Pain Pain Assessment: 0-10  Pain Score: 4  Pain Location: right hip Pain Descriptors / Indicators: Grimacing;Discomfort;Sore Pain Intervention(s): Repositioned;Monitored during session;Premedicated before session    Home Living                          Prior Function            PT Goals (current goals can now be found in the care plan section) Progress towards PT goals: Progressing toward goals    Frequency    Min 5X/week      PT Plan Discharge plan needs to be updated    Co-evaluation              AM-PAC PT "6 Clicks" Mobility   Outcome  Measure  Help needed turning from your back to your side while in a flat bed without using bedrails?: A Lot Help needed moving from lying on your back to sitting on the side of a flat bed without using bedrails?: A Lot Help needed moving to and from a bed to a chair (including a wheelchair)?: A Lot Help needed standing up from a chair using your arms (e.g., wheelchair or bedside chair)?: A Lot Help needed to walk in hospital room?: A Lot Help needed climbing 3-5 steps with a railing? : A Lot 6 Click Score: 12    End of Session Equipment Utilized During Treatment: Gait belt Activity Tolerance: Patient limited by fatigue Patient left: in bed;with call bell/phone within reach;with bed alarm set;with family/visitor present Nurse Communication: Mobility status PT Visit Diagnosis: Difficulty in walking, not elsewhere classified (R26.2);Pain Pain - Right/Left: Right Pain - part of body: Hip     Time: 0354-6568 PT Time Calculation (min) (ACUTE ONLY): 24 min  Charges:  $Gait Training: 23-37 mins                      Kati PT, DPT Acute Rehabilitation Services Pager: [phone] Office: [place] 05/25/2021, 3:55 PM

## 2021-05-25 NOTE — Progress Notes (Signed)
Physical Therapy Treatment Patient Details Name: NAVADA OSTERHOUT MRN: [id number] DOB: [birth year] Today's Date: 05/25/2021   History of Present Illness 70 year female presenting with Displaced Right femoral neck fracture and Nondisplaced right sacral ala fracture after a fall on 05/12/2021. Pt underwent Right total hip arthroplasty, anterior approach on 05/15/2021. Nonoperative treatment of right sacral ala fracture.   PMHx: kyphoplasty of T6, T7 on 12/09/20, cardiac valvular disease, coumadin, breast CA, CKD, CHF, CAD, Hodgkins disease, mitral Valve replacement, migraines, pericarditis, TIA, Wolff Parkinson's syndrome.  Subacute T8 compression fracture (per chart, around 12/5) as well, but no orders for brace.    PT Comments    Spouse attempting to assist pt back to bed on arrival.  Pt declined ambulating at this time due to fatigue and wishing to nap.  Pt with lines/drain/cords tangled so assisted pt with better positioning.  Will attempt to ambulate with pt this afternoon.    Recommendations for follow up therapy are one component of a multi-disciplinary discharge planning process, led by the attending physician.  Recommendations may be updated based on patient status, additional functional criteria and insurance authorization.  Follow Up Recommendations  Home health PT     Assistance Recommended at Discharge Frequent or constant Supervision/Assistance  Equipment Recommendations  Rolling walker (2 wheels);BSC/3in1    Recommendations for Other Services       Precautions / Restrictions Precautions Precautions: Fall Restrictions Weight Bearing Restrictions: No RLE Weight Bearing: Weight bearing as tolerated     Mobility  Bed Mobility Overal bed mobility: Needs Assistance Bed Mobility: Sit to Supine       Sit to supine: Mod assist   General bed mobility comments: pt sitting near end of bed on arrival, declined ambulating but requesting assist back to bed; pt able to scoot up  EOB with cues, pt unable to perform LEs onto bed, spouse educated on assisting bil LEs onto bed    Transfers                        Ambulation/Gait                   Stairs             Wheelchair Mobility    Modified Rankin (Stroke Patients Only)       Balance                                            Cognition Arousal/Alertness: Awake/alert Behavior During Therapy: WFL for tasks assessed/performed Overall Cognitive Status: Within Functional Limits for tasks assessed                                          Exercises      General Comments        Pertinent Vitals/Pain Pain Assessment: 0-10 Pain Score: 5  Pain Descriptors / Indicators: Grimacing;Discomfort;Sore Pain Intervention(s): Repositioned;Monitored during session    Home Living                          Prior Function            PT Goals (current goals can now be found in the care plan section) Progress towards PT  goals: Progressing toward goals    Frequency    Min 5X/week      PT Plan Current plan remains appropriate    Co-evaluation              AM-PAC PT "6 Clicks" Mobility   Outcome Measure  Help needed turning from your back to your side while in a flat bed without using bedrails?: A Lot Help needed moving from lying on your back to sitting on the side of a flat bed without using bedrails?: A Lot Help needed moving to and from a bed to a chair (including a wheelchair)?: A Lot Help needed standing up from a chair using your arms (e.g., wheelchair or bedside chair)?: A Lot Help needed to walk in hospital room?: A Lot Help needed climbing 3-5 steps with a railing? : A Lot 6 Click Score: 12    End of Session   Activity Tolerance: Patient limited by fatigue Patient left: in bed;with call bell/phone within reach;with bed alarm set Nurse Communication: Mobility status PT Visit Diagnosis: Difficulty in walking,  not elsewhere classified (R26.2);Pain     Time: 1610-9604 PT Time Calculation (min) (ACUTE ONLY): 10 min  Charges:  $Therapeutic Activity: 8-22 mins                    Jannette Spanner PT, DPT Acute Rehabilitation Services Pager: [phone] Office: [phone]    [place] 05/25/2021, 1:11 PM

## 2021-05-25 NOTE — Progress Notes (Addendum)
RN found pt with IV and medical bracelets removed. Pt A&O to self only but able to be reoriented. RN attempted IV stick 2x and placed IV consult. Heparin placed on pause until IV placed. Pharmacy notified.

## 2021-05-25 NOTE — Progress Notes (Signed)
ANTICOAGULATION CONSULT NOTE - Follow Up Consult  Pharmacy Consult for warfarin Indication: Mechanical aortic valve, mechanical mitral valve, and afib  Allergies  Allergen Reactions   Digoxin Other (See Comments)    bradycardia   Statins     myalgia   Patient Measurements: Height: 5' 3.5" (161.3 cm) Weight: 63 kg (138 lb 14.2 oz) IBW/kg (Calculated) : 53.55  Vital Signs: Temp: 98 F (36.7 C) (12/30 1525) Temp Source: Oral (12/30 1525) BP: 126/49 (12/30 1525) Pulse Rate: 101 (12/30 1525)  Labs: Recent Labs    05/22/21 2312 05/15/2021 0503 05/16/2021 0503 05/24/21 0446 05/25/21 0502 05/25/21 0833 05/25/21 1656  HGB  --  10.8*   < > 9.1* 8.8*  --   --   HCT  --  33.8*  --  27.9* 28.0*  --   --   PLT  --  376  --  335 325  --   --   LABPROT  --  14.5  --  14.8 16.7*  --   --   INR  --  1.1  --  1.2 1.4*  --   --   HEPARINUNFRC 0.30  --   --   --   --  0.28* 0.40  CREATININE  --  0.71  --  0.89 0.70  --   --    < > = values in this interval not displayed.    Estimated Creatinine Clearance: 55.4 mL/min (by C-G formula based on SCr of 0.7 mg/dL).  Assessment: 70 yo F presenting with right hip fracture after mechanical fall, pending possible right hip surgery. Patient on warfarin PTA (last dose 12/24) for atrial fibrillation and mechanical aortic and mechanical mitral valve replacement. On admission, warfarin was held and pt was given one dose of vitamin K in anticipation of surgical intervention. Pharmacy was consulted to dose heparin drip for pre-operative bridge therapy once INR <2.5.  Pharmacy now consulted to resume warfarin POD#0.   Home dose (confirmed with [facility] clinic note from 05/08/21): -Warfarin 2.5 mg MWF; 5 mg all other days -INR goal: 2.5 - 3.5   Significant Events: -12/25: Phytonadione 10 mg PO x1 dose -12/26: Heparin drip initiated for INR < 2.5 -12/28: Heparin stopped at 0600 prior to surgery  This evening, 05/25/21 Heparin level therapeutic at 0.4 with  heparin infusing at 1500 units/hr  Goal of Therapy:  Heparin level 0.3-0.7 units/ml Monitor platelets by anticoagulation protocol: Yes   Plan:  Continue heparin infusion at 1500 ml/hr Confirmatory HL 6 hours CBC, INR with AM labs tomorrow Monitor for signs and symptoms of bleeding  Royetta Asal, PharmD, [place] Please utilize Amion for appropriate phone number to reach the unit pharmacist ([facility]) 05/25/2021 7:00 PM

## 2021-05-25 NOTE — Progress Notes (Addendum)
Progress Note  Patient Name: Grace [name] Date of Encounter: 05/25/2021  [facility] Cardiologist: None [facility]  Subjective   Feeling weak today. Breathing stable on supplemental oxygen. No chest pain.   Inpatient Medications    Scheduled Meds:  Chlorhexidine Gluconate Cloth  6 each Topical Daily   cholecalciferol  1,000 Units Oral Daily   docusate sodium  100 mg Oral BID   ezetimibe  10 mg Oral QPM   levothyroxine  100 mcg Oral Q0600   mouth rinse  15 mL Mouth Rinse BID   metoprolol succinate  50 mg Oral BID   pantoprazole  40 mg Oral Daily   phosphorus  500 mg Oral TID   senna  1 tablet Oral BID   warfarin  5 mg Oral ONCE-1600   Warfarin - Pharmacist Dosing Inpatient   Does not apply q1600   Continuous Infusions:  heparin 1,400 Units/hr (05/25/21 0311)   PRN Meds: dextromethorphan-guaiFENesin, hydrALAZINE, ipratropium-albuterol, menthol-cetylpyridinium **OR** phenol, metoCLOPramide **OR** metoCLOPramide (REGLAN) injection, metoprolol tartrate, ondansetron **OR** ondansetron (ZOFRAN) IV, oxyCODONE, traZODone   Vital Signs    Vitals:   05/24/21 1249 05/24/21 2041 05/25/21 0230 05/25/21 0439  BP: 122/62 120/67  (!) 102/51  Pulse: 89 90  91  Resp: 18 20  20   Temp: 97.8 F (36.6 C) 98.1 F (36.7 C)  98.4 F (36.9 C)  TempSrc: Oral Oral  Oral  SpO2: 99% 95% 100% 96%  Weight:    63 kg  Height:        Intake/Output Summary (Last 24 hours) at 05/25/2021 1010 Last data filed at 05/25/2021 0700 Gross per 24 hour  Intake 577.17 ml  Output 1840 ml  Net -1262.83 ml   Last 3 Weights 05/25/2021 05/20/2021 04/30/2021  Weight (lbs) 138 lb 14.2 oz 138 lb 3.7 oz 132 lb 8 oz  Weight (kg) 63 kg 62.7 kg 60.102 kg      Telemetry    Sinus/junctional rhythm - Personally Reviewed  ECG    N/A  Physical Exam   GEN: Elderly female in no acute distress.   Neck: No JVD Cardiac: RRR, crisp valve sound, rubs, or gallops.  Respiratory: Clear to auscultation  bilaterally. GI: Soft, nontender, non-distended  MS: No edema; No deformity. Neuro:  Nonfocal  Psych: Normal affect   Labs    High Sensitivity Troponin:  No results for input(s): TROPONINIHS in the last 720 hours.   Chemistry Recent Labs  Lab 05/26/2021 2147 05/20/21 0836 05/21/21 0432 05/26/2021 0503 05/24/21 0446 05/25/21 0502  NA 136 134*   < > 130* 123* 127*  K 4.0 4.3   < > 3.8 3.5 3.6  CL 102 102   < > 96* 90* 92*  CO2 27 26   < > 25 23 27   GLUCOSE 95 172*   < > 105* 140* 107*  BUN 22 19   < > 15 23 19   CREATININE 1.04* 0.97   < > 0.71 0.89 0.70  CALCIUM 8.3* 8.1*   < > 7.5* 6.5* 7.1*  MG  --   --    < > 2.3 2.0 2.3  PROT 7.0 6.7  --   --   --   --   ALBUMIN 3.7 3.4*  --   --   --   --   AST 31 30  --   --   --   --   ALT 21 21  --   --   --   --  ALKPHOS 72 70  --   --   --   --   BILITOT 0.5 0.7  --   --   --   --   GFRNONAA 58* >60   < > >60 >60 >60  ANIONGAP 7 6   < > 9 10 8    < > = values in this interval not displayed.    Lipids No results for input(s): CHOL, TRIG, HDL, LABVLDL, LDLCALC, CHOLHDL in the last 168 hours.  Hematology Recent Labs  Lab 05/10/2021 0503 05/24/21 0446 05/25/21 0502  WBC 14.8* 15.5* 13.8*  RBC 3.86* 3.24* 3.19*  HGB 10.8* 9.1* 8.8*  HCT 33.8* 27.9* 28.0*  MCV 87.6 86.1 87.8  MCH 28.0 28.1 27.6  MCHC 32.0 32.6 31.4  RDW 15.6* 15.2 15.2  PLT 376 335 325   Thyroid No results for input(s): TSH, FREET4 in the last 168 hours.  BNPNo results for input(s): BNP, PROBNP in the last 168 hours.  DDimer No results for input(s): DDIMER in the last 168 hours.   Radiology    Pelvis Portable  Result Date: 05/11/2021 CLINICAL DATA:  Post right total anterior hip EXAM: PORTABLE PELVIS 1-2 VIEWS COMPARISON:  05/25/2021 FINDINGS: Interval placement of a right total hip arthroplasty. Non cemented components appear well seated. Somewhat flattened acetabular cup angle. Skin clips, soft tissue gas, and soft tissue drain are consistent with recent  surgery. IMPRESSION: Interval right total hip arthroplasty. Components appear well seated. Electronically Signed   By: Lucienne Capers M.D.   On: 05/11/2021 17:55   DG C-Arm 1-60 Min-No Report  Result Date: 05/22/2021 Fluoroscopy was utilized by the requesting physician.  No radiographic interpretation.   DG C-Arm 1-60 Min-No Report  Result Date: 05/26/2021 Fluoroscopy was utilized by the requesting physician.  No radiographic interpretation.   DG HIP UNILAT WITH PELVIS 1V RIGHT  Result Date: 04/30/2021 CLINICAL DATA:  Right total anterior hip EXAM: DG HIP (WITH OR WITHOUT PELVIS) 1V RIGHT COMPARISON:  04/26/2021 FINDINGS: Intraoperative fluoroscopy is obtained for surgical control purposes. Fluoroscopy time is recorded at 14 seconds. Cumulative dose is 1.5128 mGy. Ten spot fluoroscopic images are obtained. Spot fluoroscopic images obtained initially demonstrate a mildly displaced right femoral neck fracture with varus angulation. Subsequent images demonstrate sizing and placement of a right total hip arthroplasty with non cemented components. Components appear well seated. Mildly flattened angle of the acetabular cup. IMPRESSION: Intraoperative fluoroscopy is obtained for surgical control purposes demonstrating placement of a right total hip arthroplasty for repair of a right femoral neck fracture. Electronically Signed   By: Lucienne Capers M.D.   On: 05/16/2021 17:55    Cardiac Studies   Echo 05/20/21   IMPRESSIONS     1. Left ventricular ejection fraction, by estimation, is 55 to 60%. The  left ventricle has normal function. Left ventricular endocardial border  not optimally defined to evaluate regional wall motion. Left ventricular  diastolic parameters are  indeterminate.   2. Right ventricular systolic function is normal. The right ventricular  size is mildly enlarged.   3. Right atrial size was moderately dilated.   4. The mitral valve has been repaired/replaced. No evidence  of mitral  valve regurgitation. No evidence of mitral stenosis.   5. Tricuspid valve regurgitation is severe.   6. The aortic valve was not well visualized. Aortic valve regurgitation  is not visualized. No aortic stenosis is present. Aortic valve mean  gradient measures 7.0 mmHg. Aortic valve Vmax measures 1.79 m/s.   7.  The inferior vena cava is normal in size with greater than 50%  respiratory variability, suggesting right atrial pressure of 3 mmHg.   8. Very limited echo due to poor sound wave transmission.    Patient Profile     70 y.o. female  with a hx of mechanical mitral and aortic valve replacement at [facility] in 2008 on chronic Coumadin therapy, mild LV dysfunction with baseline EF 50%, PAF/flutter, constrictive pericarditis s/p pericardiectomy, HTN, HLD, hypothyroidism, osteopenia, venous insufficiency, Hodgkin's disease in 1974 s/p [place] field radiation and chemo, left breast cancer and nonobstructive CAD diagnosed in 2008 admitted with hip fracture and holding coumadin.   Assessment & Plan    1. Mechanical aortic valve replacement and mechanical mitral valve replacement - Warfarin was held prior to surgery and Also given Vita K - warfarin and heparin has started post surgery >> managed by pharmacy  - INR goal between 2.5-3.5  2. Chronic diastolic CHF - Consider resuming home diuretics (on lasix and sprio)   Patient's INR managed by PCP . Patient like to follow up with primary cardiologist at [facility].  Consider resuming home medications.  She was previously discharged on Lovenox bridge for sub-therapeutic INR.   Will sign off. Call with questions.   For questions or updates, please contact [place] Please consult [email or web address] for contact info under        Signed, Leanor Kail, PA  05/25/2021, 10:10 AM      Attending Note:   The patient was seen and examined.  Agree with assessment and plan as noted above.  Changes made to the above note as  needed.  Patient seen and independently examined with vin Bhagat, PA .   We discussed all aspects of the encounter. I agree with the assessment and plan as stated above.    Mechanical aortic and mitral valves:  her valves sound crisp.   Currently on heparin.    The clinical pharmacy team will manage her heparin / warfarin from here.   Goal INR is 2.5 - 3.5   She will follow up with her primary MD for her usual INR checks several days after discharge.     I have spent a total of 40 minutes with patient reviewing hospital  notes , telemetry, EKGs, labs and examining patient as well as establishing an assessment and plan that was discussed with the patient.  > 50% of time was spent in direct patient care.    Thayer Headings, Brooke Bonito., MD, [facility] 05/25/2021, 11:23 AM [address],  [place] Pager [phone]

## 2021-05-26 LAB — HEPARIN LEVEL (UNFRACTIONATED)
Heparin Unfractionated: 0.46 IU/mL (ref 0.30–0.70)
Heparin Unfractionated: 0.41 IU/mL (ref 0.30–0.70)

## 2021-05-26 LAB — CBC
HCT: 29.3 % — ABNORMAL LOW (ref 36.0–46.0)
Hemoglobin: 9.5 g/dL — ABNORMAL LOW (ref 12.0–15.0)
MCH: 28.1 pg (ref 26.0–34.0)
MCHC: 32.4 g/dL (ref 30.0–36.0)
MCV: 86.7 fL (ref 80.0–100.0)
Platelets: 411 10*3/uL — ABNORMAL HIGH (ref 150–400)
RBC: 3.38 MIL/uL — ABNORMAL LOW (ref 3.87–5.11)
RDW: 15.6 % — ABNORMAL HIGH (ref 11.5–15.5)
WBC: 15.6 10*3/uL — ABNORMAL HIGH (ref 4.0–10.5)
nRBC: 0.6 % — ABNORMAL HIGH (ref 0.0–0.2)

## 2021-05-26 LAB — PROTIME-INR
INR: 1.6 — ABNORMAL HIGH (ref 0.8–1.2)
Prothrombin Time: 18.7 seconds — ABNORMAL HIGH (ref 11.4–15.2)

## 2021-05-26 LAB — MAGNESIUM: Magnesium: 2.2 mg/dL (ref 1.7–2.4)

## 2021-05-26 MED ORDER — ASPIRIN 81 MG PO CHEW
81.0000 mg | CHEWABLE_TABLET | Freq: Every day | ORAL | Status: DC
  Administered 2021-05-26 – 2021-05-28 (×3): 81 mg via ORAL
  Filled 2021-05-26 (×3): qty 1

## 2021-05-26 MED ORDER — WARFARIN SODIUM 5 MG PO TABS
5.0000 mg | ORAL_TABLET | Freq: Once | ORAL | Status: AC
  Administered 2021-05-26: 5 mg via ORAL
  Filled 2021-05-26: qty 1

## 2021-05-26 NOTE — Progress Notes (Signed)
PROGRESS NOTE    Grace [name]  EPP:[id number] DOB: 09-21-1950 DOA: 05/13/2021 PCP: Marin Olp, MD   Brief Narrative:   70 year old female with past medical history of aortic valve replacement with mechanical valve on Coumadin, CKD stage IIIa, history of breast cancer, Hodgkin's disease, history of TIA presented to hospital after sustaining a mechanical fall.  She was noted to have right femoral fracture on the CT scan.  There was no mention of loss of consciousness prior to the fall.  Orthopedics was consulted.  Patient was initiated on heparin drip.  Patient  was seen by cardiology and a repeat echocardiogram was done which showed a dilated cardiomyopathy with ejection fraction of 55 to 60% with severe TR.   Assessment & Plan:   Principal Problem:   Closed right femoral fracture (HCC) Active Problems:   Hypothyroidism   Hyperlipidemia   Congestive heart failure (HCC)   History of cardiovascular disorder   CKD (chronic kidney disease), stage III (HCC)   Atrial flutter (HCC)   Hx of long-term (current) use of anticoagulants   CAD (coronary artery disease)   Mechanical fall causing displaced right femoral neck fracture status post right total hip arthroplasty on 04/28/2021   Patient underwent right total hip arthroplasty on 04/28/2021.   Physical therapy occupational therapy recommended home health on discharge.  Currently on heparin drip and Coumadin.  Latest INR of 1.6.   Nausea and vomiting Improved  Hyponatremia.  Latest sodium of 127.  Baseline sodium 6 days back was 136.  We will continue to monitor.  Had received few doses of IV Lasix during hospitalization  History of mechanical valve replacement, aortic and mitral Severe tricuspid regurgitation Patient had mechanical valve replacement including 2012.  On Coumadin as outpatient.  Continue Coumadin and heparin for now.  We will transition to Lovenox and Coumadin on discharge until therapeutic if patient were to  leave early.  Hypophosphatemia.  Replenished with K-Phos.  Hypothyroidism Continue Synthroid  Mild CHF congestive heart failure reduced ejection fraction, EF 55% 2D echo january 2019 showing EF of 35%.  Repeat 2D echocardiogram this time showed EF of 55%, dilated cardiomyopathy, replaced valve noted.  Severe TR. patient is on Lasix Toprol-XL, Aldactone at home.   Off oxygen this morning.  Has been resumed on  oral Lasix from home.  CKD stage II Continue to monitor BMP.  Latest creatinine at 0.7  History of atrial fibrillation with flutter On Coumadin at home, Coumadin has been resumed.  Latest INR of 1.6  Coronary artery disease No active issues.  No chest pain.  Resume aspirin after surgery.  History of constrictive pericarditis - Status post pericardiectomy.  No active disease.  Hodgkin's lymphoma, History of breast cancer status post bilateral mastectomy and reconstruction - Status post radiation and chemo.  Patient follows up with Dr Alen Blew oncology as outpatient.  Iron deficiency anemia - Follows outpatient with oncology.  Latest hemoglobin of 9.5.  We will closely monitor   Chronic venous insufficiency - Follows outpatient vascular surgery Dr. Doren Custard as outpatient.  Debility, weakness and deconditioning.  Patient expressing need for rehabilitation at this time.  PT on board.  We will consult TOC for skilled nursing facility placement   DVT prophylaxis: Coumadin and heparin drip   Code Status: Full code  Family Communication:   I  spoke with the patient's husband at bedside on 05/24/2021  Status is: Inpatient  Remains inpatient appropriate because status post surgical intervention, IV heparin drip and Coumadin anticoagulation,  awaiting for therapeutic INR  Subjective: Today, patient was seen and examined at bedside.  Complains of generalized weakness and difficulty ambulating.  Denies overt pain, shortness of breath fever or chills.  Physical Examination: General:  Thinly built, not on room air,  HENT:   No scleral pallor or icterus noted. Oral mucosa is moist.  Chest:  Clear breath sounds.  Diminished breath sounds bilaterally. No crackles or wheezes.  CVS: S1 &S2 heard. No murmur.  Regular rate and rhythm.  Mechanical valve sounds noted.   Abdomen: Soft, nontender, nondistended.  Bowel sounds are heard.   Extremities: No cyanosis, clubbing or edema.  Peripheral pulses are palpable.  Right hip surgical site with dressing in a separate site with drain in place  psych: Alert, awake and oriented, normal mood CNS:  No cranial nerve deficits.  Moves all extremities. Skin: Warm and dry.  No rashes noted.  Right hip with surgical dressing.  Objective: Vitals:   05/25/21 1222 05/25/21 1525 05/25/21 1927 05/25/21 2154  BP: 106/76 (!) 126/49 118/61 106/63  Pulse: 96 (!) 101 92 90  Resp: 16 20    Temp: 97.9 F (36.6 C) 98 F (36.7 C) 98.5 F (36.9 C)   TempSrc: Oral Oral Oral   SpO2: (!) 87% 92% 99%   Weight:      Height:        Intake/Output Summary (Last 24 hours) at 05/26/2021 1050 Last data filed at 05/26/2021 1019 Gross per 24 hour  Intake 589.97 ml  Output 1540 ml  Net -950.03 ml    Filed Weights   05/20/21 1122 05/25/21 0439  Weight: 62.7 kg 63 kg    Data Reviewed:   CBC: Recent Labs  Lab 05/05/2021 2147 05/20/21 0836 05/22/21 0258 05/18/2021 0503 05/24/21 0446 05/25/21 0502 05/26/21 0530  WBC 13.8*   < > 16.4* 14.8* 15.5* 13.8* 15.6*  NEUTROABS 10.9*  --   --   --   --   --   --   HGB 11.7*   < > 10.2* 10.8* 9.1* 8.8* 9.5*  HCT 38.2   < > 32.5* 33.8* 27.9* 28.0* 29.3*  MCV 92.0   < > 89.5 87.6 86.1 87.8 86.7  PLT 501*   < > 387 376 335 325 411*   < > = values in this interval not displayed.    Basic Metabolic Panel: Recent Labs  Lab 05/21/21 0432 05/22/21 0258 05/24/2021 0503 05/24/21 0446 05/25/21 0502 05/26/21 0530  NA 132* 132* 130* 123* 127*  --   K 4.6 4.0 3.8 3.5 3.6  --   CL 100 99 96* 90* 92*  --   CO2 21*  22 25 23 27   --   GLUCOSE 110* 102* 105* 140* 107*  --   BUN 16 15 15 23 19   --   CREATININE 0.94 0.99 0.71 0.89 0.70  --   CALCIUM 7.7* 7.5* 7.5* 6.5* 7.1*  --   MG 2.1 2.0 2.3 2.0 2.3 2.2  PHOS  --   --   --   --  1.2*  --     GFR: Estimated Creatinine Clearance: 55.4 mL/min (by C-G formula based on SCr of 0.7 mg/dL). Liver Function Tests: Recent Labs  Lab 05/15/2021 2147 05/20/21 0836  AST 31 30  ALT 21 21  ALKPHOS 72 70  BILITOT 0.5 0.7  PROT 7.0 6.7  ALBUMIN 3.7 3.4*    No results for input(s): LIPASE, AMYLASE in the last 168 hours. No results for  input(s): AMMONIA in the last 168 hours. Coagulation Profile: Recent Labs  Lab 05/22/21 0258 05/26/2021 0503 05/24/21 0446 05/25/21 0502 05/26/21 0530  INR 1.1 1.1 1.2 1.4* 1.6*    Cardiac Enzymes: No results for input(s): CKTOTAL, CKMB, CKMBINDEX, TROPONINI in the last 168 hours. BNP (last 3 results) No results for input(s): PROBNP in the last 8760 hours. HbA1C: No results for input(s): HGBA1C in the last 72 hours. CBG: Recent Labs  Lab 05/25/21 1949  GLUCAP 111*   Lipid Profile: No results for input(s): CHOL, HDL, LDLCALC, TRIG, CHOLHDL, LDLDIRECT in the last 72 hours. Thyroid Function Tests: No results for input(s): TSH, T4TOTAL, FREET4, T3FREE, THYROIDAB in the last 72 hours. Anemia Panel: No results for input(s): VITAMINB12, FOLATE, FERRITIN, TIBC, IRON, RETICCTPCT in the last 72 hours. Sepsis Labs: No results for input(s): PROCALCITON, LATICACIDVEN in the last 168 hours.  Recent Results (from the past 240 hour(s))  Resp Panel by RT-PCR (Flu A&B, Covid) Nasopharyngeal Swab     Status: None   Collection Time: 05/20/21 12:02 AM   Specimen: Nasopharyngeal Swab; Nasopharyngeal(NP) swabs in vial transport medium  Result Value Ref Range Status   SARS Coronavirus 2 by RT PCR NEGATIVE NEGATIVE Final    Comment: (NOTE) SARS-CoV-2 target nucleic acids are NOT DETECTED.  The SARS-CoV-2 RNA is generally  detectable in upper respiratory specimens during the acute phase of infection. The lowest concentration of SARS-CoV-2 viral copies this assay can detect is 138 copies/mL. A negative result does not preclude SARS-Cov-2 infection and should not be used as the sole basis for treatment or other patient management decisions. A negative result may occur with  improper specimen collection/handling, submission of specimen other than nasopharyngeal swab, presence of viral mutation(s) within the areas targeted by this assay, and inadequate number of viral copies(<138 copies/mL). A negative result must be combined with clinical observations, patient history, and epidemiological information. The expected result is Negative.  Fact Sheet for Patients:  EntrepreneurPulse.com.au  Fact Sheet for Healthcare Providers:  IncredibleEmployment.be  This test is no t yet approved or cleared by the [place] FDA and  has been authorized for detection and/or diagnosis of SARS-CoV-2 by FDA under an Emergency Use Authorization (EUA). This EUA will remain  in effect (meaning this test can be used) for the duration of the COVID-19 declaration under Section 564(b)(1) of the Act, 21 U.S.C.section 360bbb-3(b)(1), unless the authorization is terminated  or revoked sooner.       Influenza A by PCR NEGATIVE NEGATIVE Final   Influenza B by PCR NEGATIVE NEGATIVE Final    Comment: (NOTE) The Xpert Xpress SARS-CoV-2/FLU/RSV plus assay is intended as an aid in the diagnosis of influenza from Nasopharyngeal swab specimens and should not be used as a sole basis for treatment. Nasal washings and aspirates are unacceptable for Xpert Xpress SARS-CoV-2/FLU/RSV testing.  Fact Sheet for Patients: EntrepreneurPulse.com.au  Fact Sheet for Healthcare Providers: IncredibleEmployment.be  This test is not yet approved or cleared by the [place] FDA  and has been authorized for detection and/or diagnosis of SARS-CoV-2 by FDA under an Emergency Use Authorization (EUA). This EUA will remain in effect (meaning this test can be used) for the duration of the COVID-19 declaration under Section 564(b)(1) of the Act, 21 U.S.C. section 360bbb-3(b)(1), unless the authorization is terminated or revoked.  Performed at [facility], Home [address], [place],  [address]   Surgical PCR screen     Status: None   Collection Time: 05/05/2021  2:28  AM   Specimen: Nasal Mucosa; Nasal Swab  Result Value Ref Range Status   MRSA, PCR NEGATIVE NEGATIVE Final   Staphylococcus aureus NEGATIVE NEGATIVE Final    Comment: (NOTE) The Xpert SA Assay (FDA approved for NASAL specimens in patients 46 years of age and older), is one component of a comprehensive surveillance program. It is not intended to diagnose infection nor to guide or monitor treatment. Performed at [facility], [place] [address], [place], [place] [address]        Radiology Studies: No results found.     Scheduled Meds:  Chlorhexidine Gluconate Cloth  6 each Topical Daily   cholecalciferol  1,000 Units Oral Daily   docusate sodium  100 mg Oral BID   ezetimibe  10 mg Oral QPM   furosemide  40 mg Oral Daily   levothyroxine  100 mcg Oral Q0600   mouth rinse  15 mL Mouth Rinse BID   metoprolol succinate  50 mg Oral BID   pantoprazole  40 mg Oral Daily   phosphorus  500 mg Oral TID   senna  1 tablet Oral BID   Warfarin - Pharmacist Dosing Inpatient   Does not apply q1600   Continuous Infusions:    LOS: 7 days    Flora Lipps, MD [facility] If 7PM-7AM, please contact night-coverage 05/26/2021, 10:50 AM

## 2021-05-26 NOTE — Progress Notes (Signed)
ANTICOAGULATION CONSULT NOTE - Follow Up Consult  Pharmacy Consult for warfarin Indication: Mechanical aortic valve, mechanical mitral valve, and afib  Allergies  Allergen Reactions   Digoxin Other (See Comments)    bradycardia   Statins     myalgia   Patient Measurements: Height: 5' 3.5" (161.3 cm) Weight: 63 kg (138 lb 14.2 oz) IBW/kg (Calculated) : 53.55  Vital Signs:    Labs: Recent Labs    05/24/21 0446 05/25/21 0502 05/25/21 0833 05/25/21 1656 05/25/21 2318 05/26/21 0530  HGB 9.1* 8.8*  --   --   --  9.5*  HCT 27.9* 28.0*  --   --   --  29.3*  PLT 335 325  --   --   --  411*  LABPROT 14.8 16.7*  --   --   --  18.7*  INR 1.2 1.4*  --   --   --  1.6*  HEPARINUNFRC  --   --    < > 0.40 0.46 0.41  CREATININE 0.89 0.70  --   --   --   --    < > = values in this interval not displayed.    Estimated Creatinine Clearance: 55.4 mL/min (by C-G formula based on SCr of 0.7 mg/dL).  Assessment: 70 yo F presenting with right hip fracture after mechanical fall, pending possible right hip surgery. Patient on warfarin PTA (last dose 12/24) for atrial fibrillation and mechanical aortic and mechanical mitral valve replacement. On admission, warfarin was held and pt was given one dose of vitamin K in anticipation of surgical intervention. Pharmacy was consulted to dose heparin drip for pre-operative bridge therapy once INR <2.5.  Pharmacy now consulted to resume warfarin POD#0.   Home dose (confirmed with [facility] clinic note from 05/08/21): -Warfarin 2.5 mg MWF; 5 mg all other days -INR goal: 2.5 - 3.5   Significant Events: -12/25: Phytonadione 10 mg PO x1 dose -12/26: Heparin drip initiated for INR < 2.5 -12/28: Heparin stopped at 0600 prior to surgery  05/26/21 11:41 AM  Heparin level therapeutic at 0.41 with heparin infusing at 1500 units/hr INR 1.6 CBC OK  Goal of Therapy:  Heparin level 0.3-0.7 units/ml Monitor platelets by anticoagulation protocol: Yes   Plan:   Continue heparin infusion at 1500 ml/hr Warfarin 5 mg x 1 CBC, INR, HLwith AM labs tomorrow Monitor for signs and symptoms of bleeding  Ulice Dash, PharmD 05/26/2021 11:41 AM

## 2021-05-26 NOTE — Progress Notes (Signed)
Physical Therapy Treatment Patient Details Name: Grace [name] MRN: [id number] DOB: [birth year] Today's Date: 05/26/2021   History of Present Illness 70 year female presenting with Displaced Right femoral neck fracture and Nondisplaced right sacral ala fracture after a fall on 05/16/2021. Pt underwent Right total hip arthroplasty, anterior approach on 05/14/2021. Nonoperative treatment of right sacral ala fracture.   PMHx: kyphoplasty of T6, T7 on 12/09/20, cardiac valvular disease, coumadin, breast CA, CKD, CHF, CAD, Hodgkins disease, mitral Valve replacement, migraines, pericarditis, TIA, Wolff Parkinson's syndrome.  Subacute T8 compression fracture (per chart, around 12/5) as well, but no orders for brace.    PT Comments    Pt became SOB after toileting and with gait back to recliner.  Unable to ambulate >20'.  Continues to need A with bed mobility, transfers, and sequencing with gait. Recommend SNF as husband is unable to give physical A.  Recommendations for follow up therapy are one component of a multi-disciplinary discharge planning process, led by the attending physician.  Recommendations may be updated based on patient status, additional functional criteria and insurance authorization.  Follow Up Recommendations  Skilled nursing-short term rehab (<3 hours/day)     Assistance Recommended at Discharge Frequent or constant Supervision/Assistance  Equipment Recommendations  Other (comment) (equipment already delivered)    Recommendations for Other Services       Precautions / Restrictions Precautions Precautions: Fall;Other (comment) (monitor o2) Restrictions Weight Bearing Restrictions: Yes RLE Weight Bearing: Weight bearing as tolerated Other Position/Activity Restrictions: WBAT with walker     Mobility  Bed Mobility   Bed Mobility: Supine to Sit     Supine to sit: Min assist     General bed mobility comments: A for R LE and for trunk    Transfers Overall  transfer level: Needs assistance Equipment used: Rolling walker (2 wheels) Transfers: Sit to/from Stand Sit to Stand: Min assist;Min guard           General transfer comment: stood from bed with min/guard on 2nd attempt and needed cues for hand placement.  Stood from toilet with MIN A.    Ambulation/Gait Ambulation/Gait assistance: Min assist;Min guard Gait Distance (Feet): 20 Feet (x2) Assistive device: Rolling walker (2 wheels) Gait Pattern/deviations: Step-to pattern;Decreased stance time - right;Antalgic Gait velocity: decr     General Gait Details: Amb to the toilet with cues for proper sequencing and with min/guard.  After toileting, she felt a little SOB and weak and required MIN A for ambulation to recliner. o2 84% on RA. Did pursed lip breathing and it increased to low 90s.   Stairs             Wheelchair Mobility    Modified Rankin (Stroke Patients Only)       Balance   Sitting-balance support: Feet supported;No upper extremity supported Sitting balance-Leahy Scale: Good     Standing balance support: Bilateral upper extremity supported Standing balance-Leahy Scale: Poor                              Cognition Arousal/Alertness: Awake/alert Behavior During Therapy: WFL for tasks assessed/performed Overall Cognitive Status: Within Functional Limits for tasks assessed                                          Exercises Total Joint Exercises Quad Sets: 5 reps;Right Gluteal Sets:  5 reps Heel Slides: AAROM;Right Hip ABduction/ADduction: AAROM;AROM;Right;5 reps    General Comments        Pertinent Vitals/Pain Pain Assessment: 0-10 Pain Score: 0-No pain    Home Living                          Prior Function            PT Goals (current goals can now be found in the care plan section) Acute Rehab PT Goals PT Goal Formulation: With patient/family Time For Goal Achievement: 06/07/21 Potential to  Achieve Goals: Good Progress towards PT goals: Progressing toward goals    Frequency    Min 5X/week      PT Plan Current plan remains appropriate    Co-evaluation              AM-PAC PT "6 Clicks" Mobility   Outcome Measure  Help needed turning from your back to your side while in a flat bed without using bedrails?: A Lot Help needed moving from lying on your back to sitting on the side of a flat bed without using bedrails?: A Lot Help needed moving to and from a bed to a chair (including a wheelchair)?: A Little Help needed standing up from a chair using your arms (e.g., wheelchair or bedside chair)?: A Little Help needed to walk in hospital room?: A Little Help needed climbing 3-5 steps with a railing? : A Lot 6 Click Score: 15    End of Session Equipment Utilized During Treatment: Gait belt Activity Tolerance: Patient limited by fatigue Patient left: in chair;with call bell/phone within reach;with family/visitor present Nurse Communication: Mobility status;Other (comment) (o2 sats) PT Visit Diagnosis: Difficulty in walking, not elsewhere classified (R26.2);Pain Pain - Right/Left: Right Pain - part of body: Hip     Time: 0092-3300 PT Time Calculation (min) (ACUTE ONLY): 36 min  Charges:  $Gait Training: 8-22 mins $Therapeutic Exercise: 8-22 mins                     Jerusalem Brownstein L. [name], [place]  05/26/2021    Galen Manila 05/26/2021, 1:28 PM

## 2021-05-26 NOTE — Progress Notes (Signed)
Occupational Therapy Treatment Patient Details Name: Grace [name] MRN: [id number] DOB: 06/20/1950 Today's Date: 05/26/2021   History of present illness 70 year female presenting with Displaced Right femoral neck fracture and Nondisplaced right sacral ala fracture after a fall on 04/30/2021. Pt underwent Right total hip arthroplasty, anterior approach on 05/13/2021. Nonoperative treatment of right sacral ala fracture.   PMHx: kyphoplasty of T6, T7 on 12/09/20, cardiac valvular disease, coumadin, breast CA, CKD, CHF, CAD, Hodgkins disease, mitral Valve replacement, migraines, pericarditis, TIA, Wolff Parkinson's syndrome.  Subacute T8 compression fracture (per chart, around 12/5) as well, but no orders for brace.   OT comments  Treatment focused on activity tolerance and to ascertain reason for patient's report of weakness and shortness of breath. Orthostatic blood pressures monitored - see 1445 vital signs - and were negative. Patient reports mild dizziness in sitting and standing - and her affect diminished with activity and she reports generalized weakness. An uptick in heavy breathing with complaints of shortness of breath with standing but o2 sats 93-94%. Activity limited to bed transfers and standing. No obvious vital sign abnormalities. Patient's Na 127 and could be effecting her performance. Overall patient has not made progress towards goals and recommendations changed to short term rehab at discharge. Patient's husband reports he cannot physically assist her.    Recommendations for follow up therapy are one component of a multi-disciplinary discharge planning process, led by the attending physician.  Recommendations may be updated based on patient status, additional functional criteria and insurance authorization.    Follow Up Recommendations  Skilled nursing-short term rehab (<3 hours/day)    Assistance Recommended at Discharge Frequent or constant Supervision/Assistance  Equipment  Recommendations  None recommended by OT    Recommendations for Other Services      Precautions / Restrictions Precautions Precautions: Fall;Other (comment) Precaution Comments: monitor o2 Restrictions Weight Bearing Restrictions: No RLE Weight Bearing: Weight bearing as tolerated Other Position/Activity Restrictions: WBAT with walker       Mobility Bed Mobility Overal bed mobility: Needs Assistance Bed Mobility: Supine to Sit;Sit to Supine     Supine to sit: Min assist;HOB elevated Sit to supine: Min assist   General bed mobility comments: Assist for RLE for bed transfers.    Transfers Overall transfer level: Needs assistance Equipment used: Rolling walker (2 wheels) Transfers: Sit to/from Stand Sit to Stand: Min guard           General transfer comment: Min guard to stand to monitor BP     Balance Overall balance assessment: Needs assistance Sitting-balance support: No upper extremity supported;Feet supported Sitting balance-Leahy Scale: Good     Standing balance support: During functional activity Standing balance-Leahy Scale: Fair                             ADL either performed or assessed with clinical judgement   ADL                                              Extremity/Trunk Assessment              Vision Baseline Vision/History: 1 Wears glasses Patient Visual Report: No change from baseline     Perception     Praxis      Cognition Arousal/Alertness: Awake/alert Behavior During Therapy: WFL for tasks assessed/performed Overall  Cognitive Status: Within Functional Limits for tasks assessed                                            Exercises Exercises: Total Joint Total Joint Exercises Quad Sets: 5 reps;Right Gluteal Sets: 5 reps Heel Slides: AAROM;Right Hip ABduction/ADduction: AAROM;AROM;Right;5 reps   Shoulder Instructions       General Comments      Pertinent Vitals/  Pain       Pain Assessment: No/denies pain Pain Score: 0-No pain  Home Living                                          Prior Functioning/Environment              Frequency  Min 2X/week        Progress Toward Goals  OT Goals(current goals can now be found in the care plan section)  Progress towards OT goals: Not progressing toward goals - comment (limited by weakness the past two days)  Acute Rehab OT Goals Patient Stated Goal: get back to normal OT Goal Formulation: With patient Time For Goal Achievement: 06/07/21 Potential to Achieve Goals: Good  Plan Discharge plan needs to be updated    Co-evaluation                 AM-PAC OT "6 Clicks" Daily Activity     Outcome Measure   Help from another person eating meals?: None Help from another person taking care of personal grooming?: A Little Help from another person toileting, which includes using toliet, bedpan, or urinal?: A Little Help from another person bathing (including washing, rinsing, drying)?: A Little Help from another person to put on and taking off regular upper body clothing?: A Little Help from another person to put on and taking off regular lower body clothing?: A Lot 6 Click Score: 18    End of Session Equipment Utilized During Treatment: Gait belt;Rolling walker (2 wheels)  OT Visit Diagnosis: Unsteadiness on feet (R26.81);History of falling (Z91.81)   Activity Tolerance Patient tolerated treatment well   Patient Left with call bell/phone within reach;with family/visitor present;in bed;with bed alarm set   Nurse Communication  (orthostatics negative)        Time: 8315-1761 OT Time Calculation (min): 19 min  Charges: OT General Charges $OT Visit: 1 Visit OT Treatments $Therapeutic Activity: 8-22 mins  Oneda Duffett, OTR/L [place]  Office [phone] Pager: [place] 05/26/2021, 3:18 PM

## 2021-05-26 NOTE — Progress Notes (Addendum)
At shift change, pt c/o of blurry vision and "not feeling right" to day and night shift nurses. Pt vital signs stable, CBG 111, pupils dilated, arm and leg strength unchanged from baseline post-surgery. RN notified on-call provider and continues to monitor pt.   Pt no longer c/o of blurry vision and "feeling off". RN continue to monitor. 0200 05/26/21

## 2021-05-26 NOTE — Progress Notes (Signed)
Brief pharmacy anti-coagulation note:  For full details see note from Ashok Norris D  A/P: HL 0.46 therapeutic on 1500 units/hr No bleeding reported Daily heparin level and CBC  Dolly Rias RPh 05/26/2021, 12:15 AM

## 2021-05-26 NOTE — Progress Notes (Signed)
Subjective: 3 Days Post-Op Procedure(s) (LRB): TOTAL HIP ARTHROPLASTY ANTERIOR APPROACH (Right) Patient reports pain as mild.  Reports minimal pain. Her and her husband are concerned about his ability to help her at home and are now considering SNF for a short period. No other c/o.  Objective: Vital signs in last 24 hours: Temp:  [97.9 F (36.6 C)-98.5 F (36.9 C)] 98.5 F (36.9 C) (12/30 1927) Pulse Rate:  [90-101] 90 (12/30 2154) Resp:  [16-20] 20 (12/30 1525) BP: (106-126)/(49-76) 106/63 (12/30 2154) SpO2:  [87 %-99 %] 99 % (12/30 1927)  Intake/Output from previous day: 12/30 0701 - 12/31 0700 In: 590 [P.O.:240; I.V.:350] Out: 900 [Urine:900] Intake/Output this shift: No intake/output data recorded.  Recent Labs    05/24/21 0446 05/25/21 0502 05/26/21 0530  HGB 9.1* 8.8* 9.5*   Recent Labs    05/25/21 0502 05/26/21 0530  WBC 13.8* 15.6*  RBC 3.19* 3.38*  HCT 28.0* 29.3*  PLT 325 411*   Recent Labs    05/24/21 0446 05/25/21 0502  NA 123* 127*  K 3.5 3.6  CL 90* 92*  CO2 23 27  BUN 23 19  CREATININE 0.89 0.70  GLUCOSE 140* 107*  CALCIUM 6.5* 7.1*   Recent Labs    05/25/21 0502 05/26/21 0530  INR 1.4* 1.6*    Neurologically intact ABD soft Neurovascular intact Sensation intact distally Intact pulses distally Dorsiflexion/Plantar flexion intact Incision: dressing C/D/I and scant drainage No cellulitis present Compartment soft No calf pain or sign of DVT. Aquacel clean and dry. Scant drainage at drain site distally as expected.   Assessment/Plan: 3 Days Post-Op Procedure(s) (LRB): TOTAL HIP ARTHROPLASTY ANTERIOR APPROACH (Right) Advance diet Up with therapy D/C IV fluids WBAT Coumadin and heparin gtt due to mechanical heart valve Continue to keep JP drain per Dr. Lyla Glassing Awaiting D/C dispo, now considering SNF  Cecilie Kicks 05/26/2021, 8:26 AM

## 2021-05-27 LAB — HEPARIN LEVEL (UNFRACTIONATED): Heparin Unfractionated: 0.54 IU/mL (ref 0.30–0.70)

## 2021-05-27 LAB — PROTIME-INR
INR: 2 — ABNORMAL HIGH (ref 0.8–1.2)
Prothrombin Time: 22.7 seconds — ABNORMAL HIGH (ref 11.4–15.2)

## 2021-05-27 LAB — CBC
HCT: 27 % — ABNORMAL LOW (ref 36.0–46.0)
Hemoglobin: 8.3 g/dL — ABNORMAL LOW (ref 12.0–15.0)
MCH: 27.2 pg (ref 26.0–34.0)
MCHC: 30.7 g/dL (ref 30.0–36.0)
MCV: 88.5 fL (ref 80.0–100.0)
Platelets: 438 10*3/uL — ABNORMAL HIGH (ref 150–400)
RBC: 3.05 MIL/uL — ABNORMAL LOW (ref 3.87–5.11)
RDW: 15.8 % — ABNORMAL HIGH (ref 11.5–15.5)
WBC: 14.6 10*3/uL — ABNORMAL HIGH (ref 4.0–10.5)
nRBC: 0.6 % — ABNORMAL HIGH (ref 0.0–0.2)

## 2021-05-27 LAB — BASIC METABOLIC PANEL
Anion gap: 10 (ref 5–15)
BUN: 12 mg/dL (ref 8–23)
CO2: 28 mmol/L (ref 22–32)
Calcium: 6.4 mg/dL — CL (ref 8.9–10.3)
Chloride: 94 mmol/L — ABNORMAL LOW (ref 98–111)
Creatinine, Ser: 0.52 mg/dL (ref 0.44–1.00)
GFR, Estimated: >60 mL/min (ref >60)
Glucose, Bld: 89 mg/dL (ref 70–99)
Potassium: 2.9 mmol/L — ABNORMAL LOW (ref 3.5–5.1)
Sodium: 132 mmol/L — ABNORMAL LOW (ref 135–145)

## 2021-05-27 MED ORDER — NYSTATIN 100000 UNIT/ML MT SUSP
5.0000 mL | Freq: Four times a day (QID) | OROMUCOSAL | Status: DC
  Administered 2021-05-27 – 2021-05-28 (×7): 500000 [IU] via ORAL
  Filled 2021-05-27 (×7): qty 5

## 2021-05-27 MED ORDER — WARFARIN SODIUM 5 MG PO TABS
5.0000 mg | ORAL_TABLET | Freq: Once | ORAL | Status: AC
  Administered 2021-05-27: 5 mg via ORAL
  Filled 2021-05-27: qty 1

## 2021-05-27 MED ORDER — CALCIUM GLUCONATE-NACL 1-0.675 GM/50ML-% IV SOLN
1.0000 g | Freq: Once | INTRAVENOUS | Status: AC
  Administered 2021-05-27: 1000 mg via INTRAVENOUS
  Filled 2021-05-27: qty 50

## 2021-05-27 MED ORDER — MAGIC MOUTHWASH W/LIDOCAINE
5.0000 mL | Freq: Four times a day (QID) | ORAL | Status: DC | PRN
  Administered 2021-05-27 – 2021-05-28 (×2): 5 mL via ORAL
  Filled 2021-05-27 (×3): qty 5

## 2021-05-27 MED ORDER — CALCIUM CARBONATE 1250 (500 CA) MG PO TABS
1.0000 | ORAL_TABLET | Freq: Two times a day (BID) | ORAL | Status: DC
  Administered 2021-05-27 – 2021-05-28 (×3): 500 mg via ORAL
  Filled 2021-05-27 (×4): qty 1

## 2021-05-27 MED ORDER — POTASSIUM CHLORIDE 10 MEQ/100ML IV SOLN
10.0000 meq | INTRAVENOUS | Status: AC
  Administered 2021-05-27 (×4): 10 meq via INTRAVENOUS
  Filled 2021-05-27 (×4): qty 100

## 2021-05-27 NOTE — Progress Notes (Addendum)
Grace [name]  WLN:[id number] DOB: 24-Apr-1951 DOA: 05/15/2021 PCP: Marin Olp, MD   Brief Narrative:   71 year old female with past medical history of aortic valve replacement with mechanical valve on Coumadin, CKD stage IIIa, history of breast cancer, Hodgkin's disease, history of TIA presented to hospital after sustaining a mechanical fall.  She was noted to have right femoral fracture on the CT scan.  There was no mention of loss of consciousness prior to the fall.  Orthopedics was consulted.  Patient was initiated on heparin drip.  Patient  was seen by cardiology and a repeat echocardiogram was done which showed a dilated cardiomyopathy with ejection fraction of 55 to 60% with severe TR.   Assessment & Plan:   Principal Problem:   Closed right femoral fracture (HCC) Active Problems:   Hypothyroidism   Hyperlipidemia   Congestive heart failure (HCC)   History of cardiovascular disorder   CKD (chronic kidney disease), stage III (HCC)   Atrial flutter (HCC)   Hx of long-term (current) use of anticoagulants   CAD (coronary artery disease)   Mechanical fall causing displaced right femoral neck fracture status post right total hip arthroplasty on 05/03/2021 Patient underwent right total hip arthroplasty on 05/18/2021.   Physical therapy occupational therapy recommended skilled nursing facility at this time..  Currently on heparin drip and Coumadin.  Latest INR of 2.0.  INR goal of 2.5-3.5  Hyponatremia.  Latest sodium of 132 from 127.  Baseline sodium 7 days back was 136.  We will continue to monitor.  Patient has received few doses of IV Lasix during hospitalization.  Hypocalcemia.  Corrected calcium level of 6.9.  Has some numbness over the extremities.  Will receive 1 g of calcium gluconate followed by oral calcium supplement.  Check levels in a.m.  Severe hypokalemia.  We will continue replacement with IV and oral potassium.  Check levels in  a.m.  History of mechanical valve replacement, aortic and mitral Severe tricuspid regurgitation Patient had mechanical valve replacement including 2012.  On Coumadin as outpatient.  Continue Coumadin and heparin for now.   Hypophosphatemia.  Replenished with K-Phos.  Check phosphate level in AM.  Hypothyroidism Continue Synthroid  Mild CHF congestive heart failure reduced ejection fraction, EF 55% 2D echo january 2019 showing EF of 35%.  Repeat 2D echocardiogram this time showed EF of 55%, dilated cardiomyopathy, replaced valve noted.  Severe TR. patient is on Lasix,  Toprol-XL, Aldactone at home.     Has been resumed on  oral Lasix.  CKD stage II Continue to monitor BMP.  Latest creatinine at 0.7  History of atrial fibrillation with flutter On Coumadin at home, Coumadin has been resumed.  Latest INR of 1.6  Coronary artery disease No active issues.  No chest pain.  Resume aspirin after surgery.  History of constrictive pericarditis - Status post pericardiectomy.  No active disease.  Hodgkin's lymphoma, History of breast cancer status post bilateral mastectomy and reconstruction - Status post radiation and chemo.  Patient follows up with Dr Alen Blew oncology as outpatient.  Iron deficiency anemia - Follows outpatient with oncology.  Latest hemoglobin of 8.3.  We will closely monitor   Chronic venous insufficiency - Follows outpatient vascular surgery Dr. Doren Custard as outpatient.  Oral thrush.  We will start nystatin suspension.  Debility, weakness and deconditioning.   Physical therapy recommending skilled nursing facility placement on discharge.  DVT prophylaxis: Coumadin and heparin drip   Code Status: Full code  Family Communication:   I  spoke with the patient's husband at bedside on 05/24/2021, was unable to reach him today.  Status is: Inpatient  Remains inpatient appropriate because status post surgical intervention, IV heparin drip and Coumadin  for anticoagulation,  awaiting for therapeutic INR, awaiting for skilled nursing facility placement with  Subjective: Today, patient was seen and examined at bedside.  Complains of mild numbness over the extremities.  Denies overt pain shortness of breath cough or fever.  Does have mild discomfort in the oral cavity.  Physical Examination: General: Thinly built, not in obvious distress HENT:   No scleral pallor or icterus noted. Oral mucosa is moist.  Oral thrush noted Chest:   Diminished breath sounds bilaterally. No crackles or wheezes.  CVS: S1 &S2 heard. No murmur.  Regular rate and rhythm. Abdomen: Soft, nontender, nondistended.  Bowel sounds are heard.   Extremities: No cyanosis, clubbing or edema.  Peripheral pulses are palpable.  Right hip surgical site with dressing in separate drain in place. Psych: Alert, awake and oriented, normal mood CNS:  No cranial nerve deficits.  Moves all extremities. Skin: Warm and dry.  Right hip with surgical dressing.   Objective: Vitals:   05/26/21 1524 05/26/21 2210 05/27/21 0457 05/27/21 0853  BP:  (!) 112/57 109/67   Pulse:  94 87   Resp:  18 15   Temp:  98 F (36.7 C) 98.1 F (36.7 C)   TempSrc:  Oral Oral   SpO2: 91% 93% 96% 92%  Weight:      Height:        Intake/Output Summary (Last 24 hours) at 05/27/2021 1127 Last data filed at 05/27/2021 1021 Gross per 24 hour  Intake 359.61 ml  Output 950 ml  Net -590.39 ml    Filed Weights   05/20/21 1122 05/25/21 0439  Weight: 62.7 kg 63 kg    Data Reviewed:   CBC: Recent Labs  Lab 05/18/2021 0503 05/24/21 0446 05/25/21 0502 05/26/21 0530 05/27/21 0517  WBC 14.8* 15.5* 13.8* 15.6* 14.6*  HGB 10.8* 9.1* 8.8* 9.5* 8.3*  HCT 33.8* 27.9* 28.0* 29.3* 27.0*  MCV 87.6 86.1 87.8 86.7 88.5  PLT 376 335 325 411* 438*    Basic Metabolic Panel: Recent Labs  Lab 05/22/21 0258 05/15/2021 0503 05/24/21 0446 05/25/21 0502 05/26/21 0530 05/27/21 0517  NA 132* 130* 123* 127*  --  132*  K 4.0 3.8 3.5 3.6   --  2.9*  CL 99 96* 90* 92*  --  94*  CO2 22 25 23 27   --  28  GLUCOSE 102* 105* 140* 107*  --  89  BUN 15 15 23 19   --  12  CREATININE 0.99 0.71 0.89 0.70  --  0.52  CALCIUM 7.5* 7.5* 6.5* 7.1*  --  6.4*  MG 2.0 2.3 2.0 2.3 2.2  --   PHOS  --   --   --  1.2*  --   --     GFR: Estimated Creatinine Clearance: 55.4 mL/min (by C-G formula based on SCr of 0.52 mg/dL). Liver Function Tests: No results for input(s): AST, ALT, ALKPHOS, BILITOT, PROT, ALBUMIN in the last 168 hours.  No results for input(s): LIPASE, AMYLASE in the last 168 hours. No results for input(s): AMMONIA in the last 168 hours. Coagulation Profile: Recent Labs  Lab 05/13/2021 0503 05/24/21 0446 05/25/21 0502 05/26/21 0530 05/27/21 0517  INR 1.1 1.2 1.4* 1.6* 2.0*    Cardiac Enzymes: No results for input(s): CKTOTAL,  CKMB, CKMBINDEX, TROPONINI in the last 168 hours. BNP (last 3 results) No results for input(s): PROBNP in the last 8760 hours. HbA1C: No results for input(s): HGBA1C in the last 72 hours. CBG: Recent Labs  Lab 05/25/21 1949  GLUCAP 111*    Lipid Profile: No results for input(s): CHOL, HDL, LDLCALC, TRIG, CHOLHDL, LDLDIRECT in the last 72 hours. Thyroid Function Tests: No results for input(s): TSH, T4TOTAL, FREET4, T3FREE, THYROIDAB in the last 72 hours. Anemia Panel: No results for input(s): VITAMINB12, FOLATE, FERRITIN, TIBC, IRON, RETICCTPCT in the last 72 hours. Sepsis Labs: No results for input(s): PROCALCITON, LATICACIDVEN in the last 168 hours.  Recent Results (from the past 240 hour(s))  Resp Panel by RT-PCR (Flu A&B, Covid) Nasopharyngeal Swab     Status: None   Collection Time: 05/20/21 12:02 AM   Specimen: Nasopharyngeal Swab; Nasopharyngeal(NP) swabs in vial transport medium  Result Value Ref Range Status   SARS Coronavirus 2 by RT PCR NEGATIVE NEGATIVE Final    Comment: (NOTE) SARS-CoV-2 target nucleic acids are NOT DETECTED.  The SARS-CoV-2 RNA is generally detectable  in upper respiratory specimens during the acute phase of infection. The lowest concentration of SARS-CoV-2 viral copies this assay can detect is 138 copies/mL. A negative result does not preclude SARS-Cov-2 infection and should not be used as the sole basis for treatment or other patient management decisions. A negative result may occur with  improper specimen collection/handling, submission of specimen other than nasopharyngeal swab, presence of viral mutation(s) within the areas targeted by this assay, and inadequate number of viral copies(<138 copies/mL). A negative result must be combined with clinical observations, patient history, and epidemiological information. The expected result is Negative.  Fact Sheet for Patients:  EntrepreneurPulse.com.au  Fact Sheet for Healthcare Providers:  IncredibleEmployment.be  This test is no t yet approved or cleared by the [place] FDA and  has been authorized for detection and/or diagnosis of SARS-CoV-2 by FDA under an Emergency Use Authorization (EUA). This EUA will remain  in effect (meaning this test can be used) for the duration of the COVID-19 declaration under Section 564(b)(1) of the Act, 21 U.S.C.section 360bbb-3(b)(1), unless the authorization is terminated  or revoked sooner.       Influenza A by PCR NEGATIVE NEGATIVE Final   Influenza B by PCR NEGATIVE NEGATIVE Final    Comment: (NOTE) The Xpert Xpress SARS-CoV-2/FLU/RSV plus assay is intended as an aid in the diagnosis of influenza from Nasopharyngeal swab specimens and should not be used as a sole basis for treatment. Nasal washings and aspirates are unacceptable for Xpert Xpress SARS-CoV-2/FLU/RSV testing.  Fact Sheet for Patients: EntrepreneurPulse.com.au  Fact Sheet for Healthcare Providers: IncredibleEmployment.be  This test is not yet approved or cleared by the [place] FDA and has  been authorized for detection and/or diagnosis of SARS-CoV-2 by FDA under an Emergency Use Authorization (EUA). This EUA will remain in effect (meaning this test can be used) for the duration of the COVID-19 declaration under Section 564(b)(1) of the Act, 21 U.S.C. section 360bbb-3(b)(1), unless the authorization is terminated or revoked.  Performed at [facility], [place] [address], [place], [place] [address]   Surgical PCR screen     Status: None   Collection Time: 05/10/2021  2:28 AM   Specimen: Nasal Mucosa; Nasal Swab  Result Value Ref Range Status   MRSA, PCR NEGATIVE NEGATIVE Final   Staphylococcus aureus NEGATIVE NEGATIVE Final    Comment: (NOTE) The Xpert SA Assay (FDA approved  for NASAL specimens in patients 8 years of age and older), is one component of a comprehensive surveillance program. It is not intended to diagnose infection nor to guide or monitor treatment. Performed at [facility], [place] [address], [place], [place] [address]        Radiology Studies: No results found.     Scheduled Meds:  aspirin  81 mg Oral Daily   calcium carbonate  1 tablet Oral BID WC   Chlorhexidine Gluconate Cloth  6 each Topical Daily   cholecalciferol  1,000 Units Oral Daily   docusate sodium  100 mg Oral BID   ezetimibe  10 mg Oral QPM   furosemide  40 mg Oral Daily   levothyroxine  100 mcg Oral Q0600   mouth rinse  15 mL Mouth Rinse BID   metoprolol succinate  50 mg Oral BID   pantoprazole  40 mg Oral Daily   phosphorus  500 mg Oral TID   senna  1 tablet Oral BID   warfarin  5 mg Oral ONCE-1600   Warfarin - Pharmacist Dosing Inpatient   Does not apply q1600   Continuous Infusions:    LOS: 8 days    Flora Lipps, MD [facility] If 7PM-7AM, please contact night-coverage 05/27/2021, 11:27 AM

## 2021-05-27 NOTE — Progress Notes (Signed)
Subjective: 4 Days Post-Op Procedure(s) (LRB): TOTAL HIP ARTHROPLASTY ANTERIOR APPROACH (Right)  Patient reports pain as mild to moderate.  Denies fever, chills, N/V, CP, SOB.  Tolerating POs well.  Admits to flatus.  Notes that she is working well with therapy.  Objective:   VITALS:  Temp:  [98 F (36.7 C)-98.2 F (36.8 C)] 98.1 F (36.7 C) (01/01 0457) Pulse Rate:  [87-94] 87 (01/01 0457) Resp:  [15-18] 15 (01/01 0457) BP: (102-112)/(52-67) 109/67 (01/01 0457) SpO2:  [87 %-96 %] 96 % (01/01 0457)  General: WDWN patient in NAD. Psych:  Appropriate mood and affect. Neuro:  A&O x 3, Moving all extremities, sensation intact to light touch HEENT:  EOMs intact Chest:  Even non-labored respirations Skin:  Dressing/JP drain C/I with scant serosanguinous draining in drain, no rashes or lesions Extremities: warm/dry, mild edema to R thigh, no erythema or echymosis.  No lymphadenopathy. Pulses: Popliteus 2+ MSK:  ROM: TKE, MMT: able to perform quad set, (-) Homan's    LABS Recent Labs    05/25/21 0502 05/26/21 0530 05/27/21 0517  HGB 8.8* 9.5* 8.3*  WBC 13.8* 15.6* 14.6*  PLT 325 411* 438*   Recent Labs    05/25/21 0502 05/27/21 0517  NA 127* 132*  K 3.6 2.9*  CL 92* 94*  CO2 27 28  BUN 19 12  CREATININE 0.70 0.52  GLUCOSE 107* 89   Recent Labs    05/26/21 0530 05/27/21 0517  INR 1.6* 2.0*     Assessment/Plan: 4 Days Post-Op Procedure(s) (LRB): TOTAL HIP ARTHROPLASTY ANTERIOR APPROACH (Right)  Patient seen in rounds for Dr. Lyla Glassing. WBAT R LE Up with therapy DVT ppx:  Coumadin and heparin gtt for mechanical heart valve. Continue JP drain per Dr. Lyla Glassing. Disp:  pending; PT recommends SNF  Mechele Claude PA-C [facility] Office:  [phone]   Marya Amsler [facility] 05/27/2021, 7:39 AM

## 2021-05-27 NOTE — Progress Notes (Signed)
Pt c/o of sore throat and swollen tonsils that started 12/31 afternoon. Tongue is red, slightly swollen with white bumps. RN assisted pt to brush teeth and use mouth wash with some relief. On-call made aware.

## 2021-05-27 NOTE — Progress Notes (Signed)
ANTICOAGULATION CONSULT NOTE - Follow Up Consult  Pharmacy Consult for warfarin Indication: Mechanical aortic valve, mechanical mitral valve, and afib  Allergies  Allergen Reactions   Digoxin Other (See Comments)    bradycardia   Statins     myalgia   Patient Measurements: Height: 5' 3.5" (161.3 cm) Weight: 63 kg (138 lb 14.2 oz) IBW/kg (Calculated) : 53.55  Vital Signs: Temp: 98.1 [name] (36.7 C) (01/01 0457) Temp Source: Oral (01/01 0457) BP: 109/67 (01/01 0457) Pulse Rate: 87 (01/01 0457)  Labs: Recent Labs    05/25/21 0502 05/25/21 0833 05/25/21 2318 05/26/21 0530 05/27/21 0517  HGB 8.8*  --   --  9.5* 8.3*  HCT 28.0*  --   --  29.3* 27.0*  PLT 325  --   --  411* 438*  LABPROT 16.7*  --   --  18.7* 22.7*  INR 1.4*  --   --  1.6* 2.0*  HEPARINUNFRC  --    < > 0.46 0.41 0.54  CREATININE 0.70  --   --   --  0.52   < > = values in this interval not displayed.    Estimated Creatinine Clearance: 55.4 mL/min (by C-G formula based on SCr of 0.52 mg/dL).  Assessment: Grace [name] presenting with right hip fracture after mechanical fall, pending possible right hip surgery. Patient on warfarin PTA (last dose 12/24) for atrial fibrillation and mechanical aortic and mechanical mitral valve replacement. On admission, warfarin was held and pt was given one dose of vitamin K in anticipation of surgical intervention. Pharmacy was consulted to dose heparin drip for pre-operative bridge therapy once INR <2.5.  Pharmacy now consulted to resume warfarin POD#0.   Home dose (confirmed with [facility] clinic note from 05/08/21): -Warfarin 2.5 mg MWF; 5 mg all other days -INR goal: 2.5 - 3.5   Significant Events: -12/25: Phytonadione 10 mg PO x1 dose -12/26: Heparin drip initiated for INR < 2.5 -12/28: Heparin stopped at 0600 prior to surgery  05/27/21 10:27 AM  Heparin level therapeutic at 0.54 with heparin infusing at 1500 units/hr INR 2 but increasing towards goal CBC OK No bleeding per  RN  Goal of Therapy:  INR 2.5-3.5 Heparin level 0.3-0.7 units/ml Monitor platelets by anticoagulation protocol: Yes   Plan:  Continue heparin infusion at 1500 ml/hr Warfarin 5 mg x 1 CBC, INR, HLwith AM labs tomorrow Monitor for signs and symptoms of bleeding  Ulice Dash, PharmD 05/27/2021 10:27 AM

## 2021-05-27 DEATH — deceased

## 2021-05-28 LAB — BASIC METABOLIC PANEL
Anion gap: 9 (ref 5–15)
BUN: 9 mg/dL (ref 8–23)
CO2: 26 mmol/L (ref 22–32)
Calcium: 6.7 mg/dL — ABNORMAL LOW (ref 8.9–10.3)
Chloride: 99 mmol/L (ref 98–111)
Creatinine, Ser: 0.57 mg/dL (ref 0.44–1.00)
GFR, Estimated: >60 mL/min (ref >60)
Glucose, Bld: 95 mg/dL (ref 70–99)
Potassium: 3.1 mmol/L — ABNORMAL LOW (ref 3.5–5.1)
Sodium: 134 mmol/L — ABNORMAL LOW (ref 135–145)

## 2021-05-28 LAB — CBC
HCT: 26 % — ABNORMAL LOW (ref 36.0–46.0)
Hemoglobin: 8.4 g/dL — ABNORMAL LOW (ref 12.0–15.0)
MCH: 28.3 pg (ref 26.0–34.0)
MCHC: 32.3 g/dL (ref 30.0–36.0)
MCV: 87.5 fL (ref 80.0–100.0)
Platelets: 477 10*3/uL — ABNORMAL HIGH (ref 150–400)
RBC: 2.97 MIL/uL — ABNORMAL LOW (ref 3.87–5.11)
RDW: 15.9 % — ABNORMAL HIGH (ref 11.5–15.5)
WBC: 13.5 10*3/uL — ABNORMAL HIGH (ref 4.0–10.5)
nRBC: 0.5 % — ABNORMAL HIGH (ref 0.0–0.2)

## 2021-05-28 LAB — MAGNESIUM: Magnesium: 1.7 mg/dL (ref 1.7–2.4)

## 2021-05-28 LAB — PROTIME-INR
INR: 2.2 — ABNORMAL HIGH (ref 0.8–1.2)
Prothrombin Time: 24.6 s — ABNORMAL HIGH (ref 11.4–15.2)

## 2021-05-28 LAB — PHOSPHORUS: Phosphorus: 2.9 mg/dL (ref 2.5–4.6)

## 2021-05-28 LAB — HEPARIN LEVEL (UNFRACTIONATED): Heparin Unfractionated: 0.45 IU/mL (ref 0.30–0.70)

## 2021-05-28 MED ORDER — WARFARIN SODIUM 5 MG PO TABS
5.0000 mg | ORAL_TABLET | Freq: Once | ORAL | Status: AC
  Administered 2021-05-28: 5 mg via ORAL
  Filled 2021-05-28: qty 1

## 2021-05-28 MED ORDER — OXYCODONE HCL 5 MG PO TABS: 5.0000 mg | ORAL_TABLET | Freq: Once | ORAL | Status: DC

## 2021-05-28 MED ORDER — POTASSIUM CHLORIDE CRYS ER 20 MEQ PO TBCR
40.0000 meq | EXTENDED_RELEASE_TABLET | Freq: Two times a day (BID) | ORAL | Status: DC
  Administered 2021-05-28 (×2): 40 meq via ORAL
  Filled 2021-05-28 (×2): qty 2

## 2021-05-28 MED ORDER — HYDROMORPHONE HCL 1 MG/ML IJ SOLN
0.5000 mg | Freq: Once | INTRAMUSCULAR | Status: AC
  Administered 2021-05-28: 0.5 mg via INTRAVENOUS
  Filled 2021-05-28: qty 0.5

## 2021-05-28 MED ORDER — CALCIUM GLUCONATE-NACL 1-0.675 GM/50ML-% IV SOLN
1.0000 g | Freq: Once | INTRAVENOUS | Status: AC
  Administered 2021-05-28: 1000 mg via INTRAVENOUS
  Filled 2021-05-28: qty 50

## 2021-05-28 NOTE — Progress Notes (Signed)
ANTICOAGULATION CONSULT NOTE - Follow Up Consult  Pharmacy Consult for warfarin Indication: Mechanical aortic valve, mechanical mitral valve, and afib  Allergies  Allergen Reactions   Digoxin Other (See Comments)    bradycardia   Statins     myalgia   Patient Measurements: Height: 5' 3.5" (161.3 cm) Weight: 63 kg (138 lb 14.2 oz) IBW/kg (Calculated) : 53.55  Vital Signs: Temp: 98.3 F (36.8 C) (01/02 0517) Temp Source: Oral (01/02 0517) BP: 104/52 (01/02 0517) Pulse Rate: 98 (01/02 0517)  Labs: Recent Labs    05/26/21 0530 05/27/21 0517 05/28/21 0513  HGB 9.5* 8.3* 8.4*  HCT 29.3* 27.0* 26.0*  PLT 411* 438* 477*  LABPROT 18.7* 22.7* 24.6*  INR 1.6* 2.0* 2.2*  HEPARINUNFRC 0.41 0.54 0.45  CREATININE  --  0.52 0.57    Estimated Creatinine Clearance: 55.4 mL/min (by C-G formula based on SCr of 0.57 mg/dL).  Assessment: 71 yo F presenting with right hip fracture after mechanical fall, pending possible right hip surgery. Patient on warfarin PTA (last dose 12/24) for atrial fibrillation and mechanical aortic and mechanical mitral valve replacement. On admission, warfarin was held and pt was given one dose of vitamin K in anticipation of surgical intervention. Pharmacy was consulted to dose heparin drip for pre-operative bridge therapy once INR <2.5.  Pharmacy now consulted to resume warfarin POD#0.   Home dose (confirmed with [facility] clinic note from 05/08/21): -Warfarin 2.5 mg MWF; 5 mg all other days -INR goal: 2.5 - 3.5   Significant Events: -12/25: Phytonadione 10 mg PO x1 dose -12/26: Heparin drip initiated for INR < 2.5 -12/28: Heparin stopped at 0600 prior to surgery  05/28/21 12:58 PM  Heparin level therapeutic at 0.45 with heparin infusing at 1500 units/hr INR 2.2, increasing towards goal CBC OK No bleeding per RN  Goal of Therapy:  INR 2.5-3.5 Heparin level 0.3-0.7 units/ml Monitor platelets by anticoagulation protocol: Yes   Plan:  Continue heparin  infusion at 1500 ml/hr Warfarin 5 mg x 1 CBC, INR, HLwith AM labs tomorrow Monitor for signs and symptoms of bleeding   Royetta Asal, PharmD, BCPS 05/28/2021 12:58 PM

## 2021-05-28 NOTE — Progress Notes (Signed)
Subjective: 5 Days Post-Op Procedure(s) (LRB): TOTAL HIP ARTHROPLASTY ANTERIOR APPROACH (Right) Patient reports pain as mild.  Out of bed in chair this AM. Reports some aching in the hip otherwise ok. No other c/o.  Objective: Vital signs in last 24 hours: Temp:  [98.3 F (36.8 C)-99.3 F (37.4 C)] 98.3 F (36.8 C) (01/02 0517) Pulse Rate:  [98-106] 98 (01/02 0517) Resp:  [20] 20 (01/02 0517) BP: (104-114)/(52-61) 104/52 (01/02 0517) SpO2:  [91 %-93 %] 91 % (01/02 0517)  Intake/Output from previous day: 01/01 0701 - 01/02 0700 In: 1013.6 [P.O.:240; I.V.:396.8; IV Piggyback:376.7] Out: 1090 [Urine:1050; Drains:40] Intake/Output this shift: No intake/output data recorded.  Recent Labs    05/26/21 0530 05/27/21 0517 05/28/21 0513  HGB 9.5* 8.3* 8.4*   Recent Labs    05/27/21 0517 05/28/21 0513  WBC 14.6* 13.5*  RBC 3.05* 2.97*  HCT 27.0* 26.0*  PLT 438* 477*   Recent Labs    05/27/21 0517 05/28/21 0513  NA 132* 134*  K 2.9* 3.1*  CL 94* 99  CO2 28 26  BUN 12 9  CREATININE 0.52 0.57  GLUCOSE 89 95  CALCIUM 6.4* 6.7*   Recent Labs    05/27/21 0517 05/28/21 0513  INR 2.0* 2.2*    Neurologically intact ABD soft Neurovascular intact Sensation intact distally Intact pulses distally Dorsiflexion/Plantar flexion intact Incision: dressing C/D/I and scant drainage No cellulitis present Compartment soft Minimal drainage at drain site.; Aquacel dressing clean and dry JP drain in place  Assessment/Plan: 5 Days Post-Op Procedure(s) (LRB): TOTAL HIP ARTHROPLASTY ANTERIOR APPROACH (Right) Advance diet Up with therapy D/C IV fluids Awaiting SNF placement JP drain per Dr. Areta Haber RLE  Cecilie Kicks 05/28/2021, 8:13 AM

## 2021-05-28 NOTE — NC FL2 (Signed)
[place] LEVEL OF CARE SCREENING TOOL     IDENTIFICATION  Patient Name: Grace [name] Birthdate: December 19, 1950 Sex: female Admission Date (Current Location): 05/24/2021  [facility] [place] and [place] Number:  Herbalist and Address:  [facility],  [place] [address], [place]      Provider Number: [id number]  Attending Physician Name and Address:  Flora Lipps, MD  Relative Name and Phone Number:  Kluth,Bruce Spouse [phone]  [phone]    Current Level of Care: Hospital Recommended Level of Care: [place] Prior Approval Number:    Date Approved/Denied:   PASRR Number: [id number]  Discharge Plan: SNF    Current Diagnoses: Patient Active Problem List   Diagnosis Date Noted   Closed right femoral fracture ([place]) 05/25/2021   Chronic venous insufficiency 10/31/2020   Anemia 10/28/2019   Diverticulosis of colon without hemorrhage 09/18/2019   Schatzki's ring 09/18/2019   Hiatal hernia 09/18/2019   History of colonic polyps 02/26/2019   Thoracic compression fracture, with delayed healing, subsequent encounter 12/10/2018   Hx of long-term (current) use of anticoagulants 07/22/2018   Atrial flutter ([place]) 12/20/2016   Hypertension 12/20/2016   CKD (chronic kidney disease), stage III ([place]) 01/18/2014   Osteoporosis 01/17/2014   H/O aortic valve replacement and mitral valve replacement. 01/07/2013   Long term (current) use of anticoagulants 12/23/2012   Iron deficiency anemia 08/16/2009   WOLFF (WOLFE)-PARKINSON-WHITE (WPW) SYNDROME 08/16/2009   Congestive heart failure ([place]) 08/03/2009   Hyperlipidemia 08/29/2008   History of cardiovascular disorder 04/27/2007   Hypothyroidism 11/03/2006   COMMON MIGRAINE 11/03/2006   BREAST CANCER, HX OF 11/03/2006   History of Hodgkin's disease 11/03/2006   GASTROINTESTINAL HEMORRHAGE, HX OF 11/03/2006   CAD (coronary artery disease) 07/21/2006    Orientation RESPIRATION  BLADDER Height & Weight     Self, Time, Situation, Place  O2 (1L) Continent Weight: 138 lb 14.2 oz (63 kg) Height:  5' 3.5" (161.3 cm)  BEHAVIORAL SYMPTOMS/MOOD NEUROLOGICAL BOWEL NUTRITION STATUS      Continent Diet  AMBULATORY STATUS COMMUNICATION OF NEEDS Skin   Limited Assist Verbally Surgical wounds                       Personal Care Assistance Level of Assistance  Bathing, Feeding, Dressing Bathing Assistance: Limited assistance Feeding assistance: Independent Dressing Assistance: Limited assistance     Functional Limitations Info  Sight, Hearing, Speech Sight Info: Adequate Hearing Info: Adequate Speech Info: Adequate    SPECIAL CARE FACTORS FREQUENCY  PT (By licensed PT), OT (By licensed OT)     PT Frequency: Minimum 5x a week OT Frequency: Minimum 5x a week            Contractures Contractures Info: Not present    Additional Factors Info  Code Status, Allergies Code Status Info: Full Code Allergies Info: Digoxin   Statins           Current Medications (05/28/2021):  This is the current hospital active medication list Current Facility-Administered Medications  Medication Dose Route Frequency Provider Last Rate Last Admin   aspirin chewable tablet 81 mg  81 mg Oral Daily Pokhrel, Laxman, MD   81 mg at 05/28/21 0838   calcium carbonate (OS-CAL - dosed in mg of elemental calcium) tablet 500 mg of elemental calcium  1 tablet Oral BID WC Pokhrel, Laxman, MD   500 mg of elemental calcium at 05/28/21 0838   Chlorhexidine Gluconate Cloth 2 %  PADS 6 each  6 each Topical Daily Pokhrel, Laxman, MD   6 each at 05/25/21 1018   cholecalciferol (VITAMIN D3) tablet 1,000 Units  1,000 Units Oral Daily Rod Can, MD   1,000 Units at 05/28/21 0932   dextromethorphan-guaiFENesin ([place] DM) 30-600 MG per 12 hr tablet 1 tablet  1 tablet Oral BID PRN Rod Can, MD       docusate sodium (COLACE) capsule 100 mg  100 mg Oral BID Rod Can, MD   100 mg at  05/27/21 0827   ezetimibe (ZETIA) tablet 10 mg  10 mg Oral QPM Swinteck, Aaron Edelman, MD   10 mg at 05/27/21 1720   furosemide (LASIX) tablet 40 mg  40 mg Oral Daily Pokhrel, Laxman, MD   40 mg at 05/28/21 0838   heparin ADULT infusion 100 units/mL (25000 units/258mL)  1,500 Units/hr Intravenous Continuous Pokhrel, Laxman, MD 15 mL/hr at 05/28/21 1136 1,500 Units/hr at 05/28/21 1136   hydrALAZINE (APRESOLINE) injection 10 mg  10 mg Intravenous Q4H PRN Swinteck, Aaron Edelman, MD       ipratropium-albuterol (DUONEB) 0.5-2.5 (3) MG/3ML nebulizer solution 3 mL  3 mL Nebulization Q4H PRN Swinteck, Aaron Edelman, MD       levothyroxine (SYNTHROID) tablet 100 mcg  100 mcg Oral [id number] Rod Can, MD   100 mcg at 05/28/21 5809   magic mouthwash w/lidocaine  5 mL Oral QID PRN Pokhrel, Laxman, MD   5 mL at 05/28/21 0401   MEDLINE mouth rinse  15 mL Mouth Rinse BID Pokhrel, Laxman, MD   15 mL at 05/28/21 0840   metoCLOPramide (REGLAN) tablet 5-10 mg  5-10 mg Oral Q8H PRN Swinteck, Aaron Edelman, MD       Or   metoCLOPramide (REGLAN) injection 5-10 mg  5-10 mg Intravenous Q8H PRN Swinteck, Aaron Edelman, MD       metoprolol succinate (TOPROL-XL) 24 hr tablet 50 mg  50 mg Oral BID Rod Can, MD   50 mg at 05/28/21 0838   metoprolol tartrate (LOPRESSOR) injection 5 mg  5 mg Intravenous Q4H PRN Swinteck, Aaron Edelman, MD       nystatin (MYCOSTATIN) 100000 UNIT/ML suspension 500,000 Units  5 mL Oral QID Pokhrel, Laxman, MD   500,000 Units at 05/28/21 1529   ondansetron (ZOFRAN) tablet 4 mg  4 mg Oral Q6H PRN Swinteck, Aaron Edelman, MD       Or   ondansetron (ZOFRAN) injection 4 mg  4 mg Intravenous Q6H PRN Swinteck, Aaron Edelman, MD       oxyCODONE (Oxy IR/ROXICODONE) immediate release tablet 5 mg  5 mg Oral Q4H PRN Kathryne Eriksson, NP   5 mg at 05/28/21 1528   oxyCODONE (Oxy IR/ROXICODONE) immediate release tablet 5 mg  5 mg Oral Once Pokhrel, Laxman, MD       pantoprazole (PROTONIX) EC tablet 40 mg  40 mg Oral Daily Swinteck, Aaron Edelman, MD   40 mg at 05/28/21  0837   phenol (CHLORASEPTIC) mouth spray 1 spray  1 spray Mouth/Throat PRN Swinteck, Aaron Edelman, MD       phosphorus (K PHOS NEUTRAL) tablet 500 mg  500 mg Oral TID Pokhrel, Laxman, MD   500 mg at 05/28/21 0849   potassium chloride SA (KLOR-CON M) CR tablet 40 mEq  40 mEq Oral BID Pokhrel, Laxman, MD   40 mEq at 05/28/21 0837   senna (SENOKOT) tablet 8.6 mg  1 tablet Oral BID Rod Can, MD   8.6 mg at 05/27/21 0828   traZODone (DESYREL) tablet 50 mg  50  mg Oral QHS PRN Rod Can, MD   50 mg at 05/27/21 2121   Warfarin - Pharmacist Dosing Inpatient   Does not apply P1980 Lenis Noon, [facility]   Given at 05/28/21 1530     Discharge Medications: Please see discharge summary for a list of discharge medications.  Relevant Imaging Results:  Relevant Lab Results:   Additional Information SSN [id number]  Ross Ludwig, LCSW

## 2021-05-28 NOTE — Plan of Care (Signed)
  Problem: Education: Goal: Knowledge of General Education information will improve Description Including pain rating scale, medication(s)/side effects and non-pharmacologic comfort measures Outcome: Progressing   

## 2021-05-28 NOTE — Progress Notes (Signed)
PROGRESS NOTE    Grace [name]  EXH:[id number] DOB: [birth year] DOA: 05/13/2021 PCP: Marin Olp, MD   Brief Narrative:   71 year old female with past medical history of aortic valve replacement with mechanical valve on Coumadin, CKD stage IIIa, history of breast cancer, Hodgkin's disease, history of TIA presented to hospital after sustaining a mechanical fall.  She was noted to have right femoral fracture on the CT scan.  There was no mention of loss of consciousness prior to the fall.  Orthopedics was consulted.  Patient was initiated on heparin drip.  Patient  was seen by cardiology and a repeat echocardiogram was done which showed a dilated cardiomyopathy with ejection fraction of 55 to 60% with severe TR.   Assessment & Plan:   Principal Problem:   Closed right femoral fracture (HCC) Active Problems:   Hypothyroidism   Hyperlipidemia   Congestive heart failure (HCC)   History of cardiovascular disorder   CKD (chronic kidney disease), stage III (HCC)   Atrial flutter (HCC)   Hx of long-term (current) use of anticoagulants   CAD (coronary artery disease)   Mechanical fall causing displaced right femoral neck fracture status post right total hip arthroplasty on 05/07/2021 Patient underwent right total hip arthroplasty on 05/13/2021.   Physical therapy and occupational therapy recommended skilled nursing facility at this time..  Currently on heparin drip and Coumadin.  Latest INR of 2.2.  INR goal of 2.5-3.5  Hyponatremia.  Improved.  Latest sodium of 134.  Hypocalcemia.  We will repeat 1 g of calcium gluconate today.  Continue oral calcium supplements   Hypokalemia.  Potassium was 3.1 today.  We will replenish with oral potassium twice daily.  Check levels in a.m.  History of mechanical valve replacement, aortic and mitral Severe tricuspid regurgitation Patient had mechanical valve replacement including 2012.  On Coumadin as outpatient.  Continue Coumadin and heparin  for now.   Hypophosphatemia.  Improved after replacement.  Hypothyroidism Continue Synthroid  Mild CHF congestive heart failure reduced ejection fraction, EF 55% 2D echo january 2019 showing EF of 35%.  Repeat 2D echocardiogram this time showed EF of 55%, dilated cardiomyopathy, replaced valve noted.  Severe TR. patient is on Lasix,  Toprol-XL, Aldactone at home.     Continue oral Lasix at this time.  CKD stage II Continue to monitor BMP.  Latest creatinine at 0.5  History of atrial fibrillation with flutter On Coumadin at home, continue Coumadin.  Latest INR of 2.2  Coronary artery disease No active issues.  No chest pain.  Resume aspirin after surgery.  History of constrictive pericarditis Status post pericardiectomy.  No active disease.  Hodgkin's lymphoma, History of breast cancer status post bilateral mastectomy and reconstruction - Status post radiation and chemotherapy.  Patient follows up with Dr Alen Blew oncology as outpatient.  Iron deficiency anemia - Follows outpatient with oncology.  Latest hemoglobin of 8.4.  We will closely monitor   Chronic venous insufficiency - Follows outpatient vascular surgery Dr. Doren Custard as outpatient.  Oral thrush.  Continue nystatin suspension.  Debility, weakness and deconditioning.   Physical therapy recommended skilled nursing facility placement on discharge.  Disposition.  At this time patient is awaiting for skilled nursing facility placement.  DVT prophylaxis: Coumadin and heparin drip   Code Status: Full code  Family Communication:   Spoke with the patient's husband on the phone and updated him about the clinical condition of the patient  Status is: Inpatient  Remains inpatient appropriate because status post surgical  intervention, IV heparin drip and Coumadin  for anticoagulation, awaiting for therapeutic INR, awaiting for skilled nursing facility placement   Subjective: Today, was seen and examined at bedside.  Feels  better.  Sitting up in chair.  Denies any pain, nausea vomiting has had a bowel movement.    Physical Examination: General: Thinly built, not in obvious distress HENT:   No scleral pallor or icterus noted. Oral mucosa is moist.  Chest:   Diminished breath sounds bilaterally. No crackles or wheezes.  CVS: S1 &S2 heard. No murmur.  Regular rate and rhythm. Abdomen: Soft, nontender, nondistended.  Bowel sounds are heard.   Extremities: No cyanosis, clubbing or edema.  Peripheral pulses are palpable.  Right hip surgical site with dressing and drain in place. Psych: Alert, awake and oriented, normal mood CNS:  No cranial nerve deficits.  Moves all the extremities. Skin: Warm and dry.  No rashes noted.   Objective: Vitals:   05/27/21 0853 05/27/21 1503 05/27/21 2031 05/28/21 0517  BP:  111/61 (!) 114/54 (!) 104/52  Pulse:  (!) 103 (!) 106 98  Resp:  20 20 20   Temp:  99.3 F (37.4 C) 98.3 F (36.8 C) 98.3 F (36.8 C)  TempSrc:  Oral Oral Oral  SpO2: 92% 91% 93% 91%  Weight:      Height:        Intake/Output Summary (Last 24 hours) at 05/28/2021 1453 Last data filed at 05/28/2021 0600 Gross per 24 hour  Intake 1013.55 ml  Output 540 ml  Net 473.55 ml    Filed Weights   05/20/21 1122 05/25/21 0439  Weight: 62.7 kg 63 kg    Data Reviewed:   CBC: Recent Labs  Lab 05/24/21 0446 05/25/21 0502 05/26/21 0530 05/27/21 0517 05/28/21 0513  WBC 15.5* 13.8* 15.6* 14.6* 13.5*  HGB 9.1* 8.8* 9.5* 8.3* 8.4*  HCT 27.9* 28.0* 29.3* 27.0* 26.0*  MCV 86.1 87.8 86.7 88.5 87.5  PLT 335 325 411* 438* 477*    Basic Metabolic Panel: Recent Labs  Lab 05/16/2021 0503 05/24/21 0446 05/25/21 0502 05/26/21 0530 05/27/21 0517 05/28/21 0513  NA 130* 123* 127*  --  132* 134*  K 3.8 3.5 3.6  --  2.9* 3.1*  CL 96* 90* 92*  --  94* 99  CO2 25 23 27   --  28 26  GLUCOSE 105* 140* 107*  --  89 95  BUN 15 23 19   --  12 9  CREATININE 0.71 0.89 0.70  --  0.52 0.57  CALCIUM 7.5* 6.5* 7.1*  --   6.4* 6.7*  MG 2.3 2.0 2.3 2.2  --  1.7  PHOS  --   --  1.2*  --   --  2.9    GFR: Estimated Creatinine Clearance: 55.4 mL/min (by C-G formula based on SCr of 0.57 mg/dL). Liver Function Tests: No results for input(s): AST, ALT, ALKPHOS, BILITOT, PROT, ALBUMIN in the last 168 hours.  No results for input(s): LIPASE, AMYLASE in the last 168 hours. No results for input(s): AMMONIA in the last 168 hours. Coagulation Profile: Recent Labs  Lab 05/24/21 0446 05/25/21 0502 05/26/21 0530 05/27/21 0517 05/28/21 0513  INR 1.2 1.4* 1.6* 2.0* 2.2*    Cardiac Enzymes: No results for input(s): CKTOTAL, CKMB, CKMBINDEX, TROPONINI in the last 168 hours. BNP (last 3 results) No results for input(s): PROBNP in the last 8760 hours. HbA1C: No results for input(s): HGBA1C in the last 72 hours. CBG: Recent Labs  Lab 05/25/21 1949  GLUCAP 111*    Lipid Profile: No results for input(s): CHOL, HDL, LDLCALC, TRIG, CHOLHDL, LDLDIRECT in the last 72 hours. Thyroid Function Tests: No results for input(s): TSH, T4TOTAL, FREET4, T3FREE, THYROIDAB in the last 72 hours. Anemia Panel: No results for input(s): VITAMINB12, FOLATE, FERRITIN, TIBC, IRON, RETICCTPCT in the last 72 hours. Sepsis Labs: No results for input(s): PROCALCITON, LATICACIDVEN in the last 168 hours.  Recent Results (from the past 240 hour(s))  Resp Panel by RT-PCR (Flu A&B, Covid) Nasopharyngeal Swab     Status: None   Collection Time: 05/20/21 12:02 AM   Specimen: Nasopharyngeal Swab; Nasopharyngeal(NP) swabs in vial transport medium  Result Value Ref Range Status   SARS Coronavirus 2 by RT PCR NEGATIVE NEGATIVE Final    Comment: (NOTE) SARS-CoV-2 target nucleic acids are NOT DETECTED.  The SARS-CoV-2 RNA is generally detectable in upper respiratory specimens during the acute phase of infection. The lowest concentration of SARS-CoV-2 viral copies this assay can detect is 138 copies/mL. A negative result does not preclude  SARS-Cov-2 infection and should not be used as the sole basis for treatment or other patient management decisions. A negative result may occur with  improper specimen collection/handling, submission of specimen other than nasopharyngeal swab, presence of viral mutation(s) within the areas targeted by this assay, and inadequate number of viral copies(<138 copies/mL). A negative result must be combined with clinical observations, patient history, and epidemiological information. The expected result is Negative.  Fact Sheet for Patients:  EntrepreneurPulse.com.au  Fact Sheet for Healthcare Providers:  IncredibleEmployment.be  This test is no t yet approved or cleared by the [place] FDA and  has been authorized for detection and/or diagnosis of SARS-CoV-2 by FDA under an Emergency Use Authorization (EUA). This EUA will remain  in effect (meaning this test can be used) for the duration of the COVID-19 declaration under Section 564(b)(1) of the Act, 21 U.S.C.section 360bbb-3(b)(1), unless the authorization is terminated  or revoked sooner.       Influenza A by PCR NEGATIVE NEGATIVE Final   Influenza B by PCR NEGATIVE NEGATIVE Final    Comment: (NOTE) The Xpert Xpress SARS-CoV-2/FLU/RSV plus assay is intended as an aid in the diagnosis of influenza from Nasopharyngeal swab specimens and should not be used as a sole basis for treatment. Nasal washings and aspirates are unacceptable for Xpert Xpress SARS-CoV-2/FLU/RSV testing.  Fact Sheet for Patients: EntrepreneurPulse.com.au  Fact Sheet for Healthcare Providers: IncredibleEmployment.be  This test is not yet approved or cleared by the [place] FDA and has been authorized for detection and/or diagnosis of SARS-CoV-2 by FDA under an Emergency Use Authorization (EUA). This EUA will remain in effect (meaning this test can be used) for the duration of  the COVID-19 declaration under Section 564(b)(1) of the Act, 21 U.S.C. section 360bbb-3(b)(1), unless the authorization is terminated or revoked.  Performed at [facility], [place] [address] [place], [place] [address]   Surgical PCR screen     Status: None   Collection Time: 05/09/2021  2:28 AM   Specimen: Nasal Mucosa; Nasal Swab  Result Value Ref Range Status   MRSA, PCR NEGATIVE NEGATIVE Final   Staphylococcus aureus NEGATIVE NEGATIVE Final    Comment: (NOTE) The Xpert SA Assay (FDA approved for NASAL specimens in patients 65 years of age and older), is one component of a comprehensive surveillance program. It is not intended to diagnose infection nor to guide or monitor treatment. Performed at [facility], [place] [address] [place], [place] [address]        Radiology Studies: No results found.     Scheduled Meds:  aspirin  81 mg Oral Daily   calcium carbonate  1 tablet Oral BID WC   Chlorhexidine Gluconate Cloth  6 each Topical Daily   cholecalciferol  1,000 Units Oral Daily   docusate sodium  100 mg Oral BID   ezetimibe  10 mg Oral QPM   furosemide  40 mg Oral Daily   levothyroxine  100 mcg Oral Q0600   mouth rinse  15 mL Mouth Rinse BID   metoprolol succinate  50 mg Oral BID   nystatin  5 mL Oral QID   pantoprazole  40 mg Oral Daily   phosphorus  500 mg Oral TID   potassium chloride  40 mEq Oral BID   senna  1 tablet Oral BID   warfarin  5 mg Oral ONCE-1600   Warfarin - Pharmacist Dosing Inpatient   Does not apply q1600   Continuous Infusions:    LOS: 9 days    Flora Lipps, MD [facility] If 7PM-7AM, please contact night-coverage 05/28/2021, 2:53 PM

## 2021-05-28 NOTE — TOC Initial Note (Addendum)
Transition of Care [facility]) - Initial/Assessment Note    Patient Details  Name: Grace [name] MRN: [id number] Date of Birth: 1950-06-22  Transition of Care [facility]) CM/SW Contact:    Ross Ludwig, LCSW Phone Number: 05/28/2021, 5:36 PM  Clinical Narrative:                  Patient is a 71 year old female who is married and lives with husband in [place].  Patient lives with her husband, and normally able to walk independently and able to complete her ADLs.  Per patient's husband she has never been in a SNF for short term rehab.  CSW explained the process and how insurance pays for stay at [facility] and what to expect.  Patient and her are requesting [facility].  CSW faxed patient's information out, and also explained to patient's husband what to expect day of discharge.  CSW called [facility] and spoke to [facility] to let her know that patient is interested in placement at [facility].  Expected Discharge Plan: Skilled Nursing Facility Barriers to Discharge: Continued Medical Work up   Patient Goals and CMS Choice Patient states their goals for this hospitalization and ongoing recovery are:: To go to SNF for short term rehab, then return back home. CMS Medicare.gov Compare Post Acute Care list provided to:: Patient Represenative (must comment) Choice offered to / list presented to : Spouse  Expected Discharge Plan and Services Expected Discharge Plan: [place]   Discharge Planning Services: CM Consult   Living arrangements for the past 2 months: Single Family Home                 DME Arranged: 3-N-1, [place] rolling DME Agency: [employer] Date DME Agency Contacted: 05/25/21 Time DME Agency Contacted: [phone] Representative spoke with at DME Agency: Andee Poles HH Arranged: PT, OT HH Agency: [facility] Date [place]: 05/25/21 Time Media: [phone] Representative spoke with at [place]: Tommi Rumps  Prior Living Arrangements/Services Living  arrangements for the past 2 months: [place] with:: Spouse Patient language and need for interpreter reviewed:: No Do you feel safe going back to the place where you live?: No   Patient feels she needs some short term rehab, before she is returning back home.  Need for Family Participation in Patient Care: No (Comment) Care giver support system in place?: No (comment)   Criminal Activity/Legal Involvement Pertinent to Current Situation/Hospitalization: No - Comment as needed  Activities of Daily Living Home Assistive Devices/Equipment: Eyeglasses ADL Screening (condition at time of admission) Patient's cognitive ability adequate to safely complete daily activities?: Yes Is the patient deaf or have difficulty hearing?: No Does the patient have difficulty seeing, even when wearing glasses/contacts?: No Does the patient have difficulty concentrating, remembering, or making decisions?: Yes Patient able to express need for assistance with ADLs?: Yes Does the patient have difficulty dressing or bathing?: No Independently performs ADLs?: Yes (appropriate for developmental age) Does the patient have difficulty walking or climbing stairs?: Yes Weakness of Legs: Right Weakness of Arms/Hands: None  Permission Sought/Granted Permission sought to share information with : Case Manager, Customer service manager, Family Supports Permission granted to share information with : Yes, Release of Information Signed  Share Information with NAME: Bukhari,Bruce Spouse [phone]  [phone]  Permission granted to share info w AGENCY: SNF admissions        Emotional Assessment Appearance:: Appears stated age Attitude/Demeanor/Rapport: Engaged Affect (typically observed): Accepting, Anxious, Appropriate Orientation: :  Oriented to Self, Oriented to Place, Oriented to  Time, Oriented to Situation Alcohol / Substance Use: Not Applicable Psych Involvement: No (comment)  Admission  diagnosis:  Closed right femoral fracture (HCC) [S72.91XA] Patient Active Problem List   Diagnosis Date Noted   Closed right femoral fracture ([place]) 04/26/2021   Chronic venous insufficiency 10/31/2020   Anemia 10/28/2019   Diverticulosis of colon without hemorrhage 09/18/2019   Schatzki's ring 09/18/2019   Hiatal hernia 09/18/2019   History of colonic polyps 02/26/2019   Thoracic compression fracture, with delayed healing, subsequent encounter 12/10/2018   Hx of long-term (current) use of anticoagulants 07/22/2018   Atrial flutter ([place]) 12/20/2016   Hypertension 12/20/2016   CKD (chronic kidney disease), stage III ([place]) 01/18/2014   Osteoporosis 01/17/2014   H/O aortic valve replacement and mitral valve replacement. 01/07/2013   Long term (current) use of anticoagulants 12/23/2012   Iron deficiency anemia 08/16/2009   WOLFF (WOLFE)-PARKINSON-WHITE (WPW) SYNDROME 08/16/2009   Congestive heart failure ([place]) 08/03/2009   Hyperlipidemia 08/29/2008   History of cardiovascular disorder 04/27/2007   Hypothyroidism 11/03/2006   COMMON MIGRAINE 11/03/2006   BREAST CANCER, HX OF 11/03/2006   History of Hodgkin's disease 11/03/2006   GASTROINTESTINAL HEMORRHAGE, HX OF 11/03/2006   CAD (coronary artery disease) 07/21/2006   PCP:  Marin Olp, MD Pharmacy:   [facility] - [place], [place] [address] Phone: [phone] Fax: [phone]  [id number] - [place], [place] [address] [place] [address] Phone: [phone] Fax: [phone]     Social Determinants of Health (SDOH) Interventions    Readmission Risk Interventions No flowsheet data found.

## 2021-05-28 NOTE — Progress Notes (Signed)
Physical Therapy Treatment Patient Details Name: Grace [name] MRN: [id number] DOB: 14-Feb-1951 Today's Date: 05/28/2021   History of Present Illness 71 year female presenting with Displaced Right femoral neck fracture and Nondisplaced right sacral ala fracture after a fall on 05/21/2021. Pt underwent Right total hip arthroplasty, anterior approach on 05/03/2021. Nonoperative treatment of right sacral ala fracture.   PMHx: kyphoplasty of T6, T7 on 12/09/20, cardiac valvular disease, coumadin, breast CA, CKD, CHF, CAD, Hodgkins disease, mitral Valve replacement, migraines, pericarditis, TIA, Wolff Parkinson's syndrome.  Subacute T8 compression fracture (per chart, around 12/5) as well, but no orders for brace.    PT Comments    Pt assisted with ambulating in hallway and tolerated improved distance of 40 ft with RW.  SPO2 also improved.  93% room air at rest.  88% room air at lowest during ambulation.  SPO2 mostly 91-93% on room air during ambulation and SPO2 96% on room air upon returning to bed end  of session.  Pt pleased with progress and feels ready for d/c to SNF.    Recommendations for follow up therapy are one component of a multi-disciplinary discharge planning process, led by the attending physician.  Recommendations may be updated based on patient status, additional functional criteria and insurance authorization.  Follow Up Recommendations  Skilled nursing-short term rehab (<3 hours/day)     Assistance Recommended at Discharge Frequent or constant Supervision/Assistance  Equipment Recommendations  Other (comment) (equipment already delivered)    Recommendations for Other Services       Precautions / Restrictions Precautions Precautions: Fall;Other (comment) Precaution Comments: monitor o2 Restrictions Other Position/Activity Restrictions: WBAT with walker     Mobility  Bed Mobility Overal bed mobility: Needs Assistance Bed Mobility: Supine to Sit;Sit to Supine      Supine to sit: Min guard Sit to supine: Min assist   General bed mobility comments: pt self assisted R LE off bed with UEs however required assist for RLE onto bed    Transfers Overall transfer level: Needs assistance Equipment used: Rolling walker (2 wheels) Transfers: Sit to/from Stand Sit to Stand: Min guard           General transfer comment: verbal cues for hand placement    Ambulation/Gait Ambulation/Gait assistance: Min guard;Min assist Gait Distance (Feet): 40 Feet Assistive device: Rolling walker (2 wheels) Gait Pattern/deviations: Step-to pattern;Decreased stance time - right;Antalgic       General Gait Details: verbal cues for step length, pt with improved ability to advance R LE today, SpO2 mostly 91-93% on room air during ambulation (briefly dropped to 88% at lowest) and 96% room air upon returning to bed   Stairs             Wheelchair Mobility    Modified Rankin (Stroke Patients Only)       Balance                                            Cognition Arousal/Alertness: Awake/alert Behavior During Therapy: WFL for tasks assessed/performed Overall Cognitive Status: Within Functional Limits for tasks assessed                                          Exercises Total Joint Exercises Ankle Circles/Pumps: AROM;Both;5 reps Gluteal Sets: AROM;Right;5 reps  Heel Slides: AROM;Right;5 reps Hip ABduction/ADduction: AROM;Right;5 reps    General Comments        Pertinent Vitals/Pain Pain Assessment: Faces Faces Pain Scale: Hurts little more Pain Location: right hip Pain Descriptors / Indicators: Sore;Tender Pain Intervention(s): Monitored during session;Repositioned (no pain at rest, increases a little with mobility)    Home Living                          Prior Function            PT Goals (current goals can now be found in the care plan section) Progress towards PT goals: Progressing  toward goals    Frequency    Min 3X/week      PT Plan Current plan remains appropriate;Frequency needs to be updated    Co-evaluation              AM-PAC PT "6 Clicks" Mobility   Outcome Measure  Help needed turning from your back to your side while in a flat bed without using bedrails?: A Little Help needed moving from lying on your back to sitting on the side of a flat bed without using bedrails?: A Little Help needed moving to and from a bed to a chair (including a wheelchair)?: A Little Help needed standing up from a chair using your arms (e.g., wheelchair or bedside chair)?: A Little Help needed to walk in hospital room?: A Little Help needed climbing 3-5 steps with a railing? : A Lot 6 Click Score: 17    End of Session Equipment Utilized During Treatment: Gait belt Activity Tolerance: Patient tolerated treatment well Patient left: in bed;with call bell/phone within reach;with bed alarm set;with family/visitor present Nurse Communication: Mobility status PT Visit Diagnosis: Difficulty in walking, not elsewhere classified (R26.2)     Time: 0973-5329 PT Time Calculation (min) (ACUTE ONLY): 21 min  Charges:  $Gait Training: 8-22 mins                    Arlyce Dice, DPT Acute Rehabilitation Services Pager: [phone] Office: [place] 05/28/2021, 4:02 PM

## 2021-05-29 ENCOUNTER — Inpatient Hospital Stay (HOSPITAL_COMMUNITY): Payer: Medicare PPO | Admitting: Anesthesiology

## 2021-05-29 LAB — CBC
HCT: 23.2 % — ABNORMAL LOW (ref 36.0–46.0)
Hemoglobin: 7.4 g/dL — ABNORMAL LOW (ref 12.0–15.0)
MCH: 27.7 pg (ref 26.0–34.0)
MCHC: 31.9 g/dL (ref 30.0–36.0)
MCV: 86.9 fL (ref 80.0–100.0)
Platelets: 480 10*3/uL — ABNORMAL HIGH (ref 150–400)
RBC: 2.67 MIL/uL — ABNORMAL LOW (ref 3.87–5.11)
RDW: 16 % — ABNORMAL HIGH (ref 11.5–15.5)
WBC: 22.9 10*3/uL — ABNORMAL HIGH (ref 4.0–10.5)
nRBC: 0.4 % — ABNORMAL HIGH (ref 0.0–0.2)

## 2021-05-29 LAB — PROTIME-INR
INR: 3.1 — ABNORMAL HIGH (ref 0.8–1.2)
Prothrombin Time: 31.8 seconds — ABNORMAL HIGH (ref 11.4–15.2)

## 2021-05-29 LAB — BASIC METABOLIC PANEL
Anion gap: 12 (ref 5–15)
BUN: 11 mg/dL (ref 8–23)
CO2: 21 mmol/L — ABNORMAL LOW (ref 22–32)
Calcium: 6.5 mg/dL — ABNORMAL LOW (ref 8.9–10.3)
Chloride: 97 mmol/L — ABNORMAL LOW (ref 98–111)
Creatinine, Ser: 0.77 mg/dL (ref 0.44–1.00)
GFR, Estimated: >60 mL/min (ref >60)
Glucose, Bld: 116 mg/dL — ABNORMAL HIGH (ref 70–99)
Potassium: 4.6 mmol/L (ref 3.5–5.1)
Sodium: 130 mmol/L — ABNORMAL LOW (ref 135–145)

## 2021-05-29 LAB — HEPARIN LEVEL (UNFRACTIONATED): Heparin Unfractionated: 0.67 IU/mL (ref 0.30–0.70)

## 2021-05-29 LAB — GLUCOSE, CAPILLARY
Glucose-Capillary: 126 mg/dL — ABNORMAL HIGH (ref 70–99)
Glucose-Capillary: 86 mg/dL (ref 70–99)

## 2021-05-29 MED ORDER — HYDROMORPHONE HCL 1 MG/ML IJ SOLN
0.5000 mg | Freq: Once | INTRAMUSCULAR | Status: AC
  Administered 2021-05-29: 0.5 mg via INTRAVENOUS
  Filled 2021-05-29: qty 0.5

## 2021-05-29 MED ORDER — EPINEPHRINE HCL 5 MG/250ML IV SOLN IN NS
0.5000 ug/min | INTRAVENOUS | Status: DC
  Filled 2021-05-29: qty 250

## 2021-05-29 MED ORDER — POTASSIUM CHLORIDE CRYS ER 20 MEQ PO TBCR: 40.0000 meq | EXTENDED_RELEASE_TABLET | Freq: Every day | ORAL | Status: DC

## 2021-05-29 MED FILL — Medication: Qty: 1 | Status: AC

## 2021-06-05 ENCOUNTER — Ambulatory Visit: Payer: Medicare PPO

## 2021-06-27 NOTE — Death Summary Note (Signed)
DEATH SUMMARY   Patient Details  Name: Grace [name] MRN: [id number] DOB: 03-12-1951  Admission/Discharge Information   Admit Date:  05-20-21  Date of Death: Date of Death: 05/30/21  Time of Death: Time of Death: 1025-08-10  Length of Stay: 05-Aug-2022  Referring Physician: Marin Olp, MD   Reason(s) for Hospitalization  Fall with hip fracture.  Diagnoses  Preliminary cause of death:  Cardiopulmonary arrest  Secondary Diagnoses (including complications and co-morbidities):  Principal Problem:   Closed right femoral fracture (HCC) Active Problems:   Hypothyroidism   Hyperlipidemia   Congestive heart failure (HCC)   History of cardiovascular disorder   CKD (chronic kidney disease), stage III (HCC)   Atrial flutter (HCC)   Hx of long-term (current) use of anticoagulants   CAD (coronary artery disease)   Brief Hospital Course (including significant findings, care, treatment, and services provided and events leading to death)  Grace [name] is a 71 y.o. year old female  with past medical history of aortic valve replacement with mechanical valve on Coumadin, CKD stage IIIa, history of breast cancer, Hodgkin's disease, history of TIA, cardiomyopathy with last known ejection fraction of 55% presented to the hospital after sustaining a mechanical fall.  She was noted to have right femoral fracture on the CT scan. there was no mention of loss of consciousness prior to the fall.  Orthopedics was consulted.  Patient was initiated on heparin drip.  Patient  was seen by cardiology and a repeat echocardiogram was done which showed a dilated cardiomyopathy with ejection fraction of 55 to 60% with severe TR. Patient was then admitted hospital for further evaluation and treatment.  Following conditions were addressed during hospitalization,  Mechanical fall causing displaced right femoral neck fracture status post right total hip arthroplasty on 05/08/2021 Patient underwent right total hip  arthroplasty on 05/01/2021.   Physical therapy and occupational therapy recommended skilled nursing facility.    Hyponatremia.  Improved.  Latest sodium was 135   Hypocalcemia.  Patient was receiving IV calcium gluconate daily with oral calcium supplement as well.    Hypokalemia.  Potassium was 3.1 on 06/07/2021 and was given IV and oral potassium.  Potassium level on 30-May-2021 was 4.6.  Magnesium level was within normal limit at 1.7.  history of mechanical valve replacement, aortic and mitral Severe tricuspid regurgitation Patient had mechanical valve replacement- Aug 11, 2010.  On Coumadin as outpatient.  Continue Coumadin and heparin for now.  Patient was continued on Coumadin and heparin during hospitalization and her INR was 3.1 on May 30, 2021.  Heparin drip was then discontinued   Hypophosphatemia.  Improved after replacement.  Latest phosphorus was 2.9.   Hypothyroidism Patient was on Synthroid  History of congestive heart failure with reduced ejection fraction, EF 55% 2D echo january 2019 showing EF of 35%.  Repeat 2D echocardiogram this time showed EF of 55%, dilated cardiomyopathy, replaced valve noted.  Severe TR. patient was on Lasix,  Toprol-XL, Aldactone at home.    She was initiated on oral Lasix during hospitalization but  CKD stage II Remained stable.  Latest creatinine at 0.5   History of atrial fibrillation with flutter On Coumadin at home, was on heparin drip during hospitalization..  INR was therapeutic on 05-30-21   coronary artery disease Did not complain of any chest pain..  Patient was resumed aspirin after surgery    History of constrictive pericarditis Status post pericardiectomy.  No active disease.  Sent echo without pericardial effusion.   History of Hodgkin's lymphoma,  History of breast cancer status post bilateral mastectomy and reconstruction - Status post radiation and chemotherapy.  Patient followed with Dr Alen Blew oncology as outpatient.   Iron deficiency  anemia - Follows outpatient with oncology.  Latest hemoglobin was 7.4.     Chronic venous insufficiency - Follows outpatient vascular surgery Dr. Doren Custard as outpatient.   Oral thrush.  Patient was on nystatin suspension.   Debility, weakness and deconditioning.   Physical therapy had recommended skilled nursing facility placement on discharge.  On 06-18-2021, patient was found to be unresponsive.  CPR was initiated.  Patient received epinephrine and shock with brief cardiac activity.  CPR was performed more than 15 minutes and during this time patient was intubated but subsequently patient was pulseless so further CPR was not attempted.  Patient subsequently passed away at 10:27 AM.   Pertinent Labs and Studies  Significant Diagnostic Studies CT ABDOMEN PELVIS WO CONTRAST  Result Date: 05/26/2021 CLINICAL DATA:  Recent fall on the right side with pain and known right femoral fracture, initial encounter EXAM: CT ABDOMEN AND PELVIS WITHOUT CONTRAST TECHNIQUE: Multidetector CT imaging of the abdomen and pelvis was performed following the standard protocol without IV contrast. COMPARISON:  01/27/2013 FINDINGS: Lower chest: Mild scarring is noted in the lung bases. Breast implants are noted. Hepatobiliary: No focal liver abnormality is seen. No gallstones, gallbladder wall thickening, or biliary dilatation. Pancreas: Unremarkable. No pancreatic ductal dilatation or surrounding inflammatory changes. Spleen: Surgically removed Adrenals/Urinary Tract: Right adrenal gland is within normal limits. Stable left adrenal lesion is noted consistent with the an adenoma. This measures up to 3.7 cm. Right kidney is somewhat ptotic. No renal calculi or obstructive changes are noted. Ureters are within normal limits. Bladder is well distended. Stomach/Bowel: Appendix is within normal limits. Colon shows no obstructive or inflammatory changes. No small bowel abnormality is noted. Stomach is well distended.  Vascular/Lymphatic: No significant vascular findings are present. No enlarged abdominal or pelvic lymph nodes. Reproductive: Uterus and bilateral adnexa are unremarkable. Other: Postsurgical changes are noted in the upper abdomen stable from the prior exam. No free fluid is noted. Musculoskeletal: Right subcapital femoral neck fracture is noted. Femoral head is well seated. Chronic sclerosis is noted within the sacrum bilaterally consistent with healed insufficiency fractures. Undisplaced right sacral fracture is noted best seen on the sagittal images number 59 of series 7. Old rib fractures are noted on the right. No acute bony abnormality is noted. IMPRESSION: Right subcapital femoral neck fractures similar to that seen on prior plain film. Undisplaced right sacral fracture is noted best seen on the sagittal images. Adjacent sclerotic healed insufficiency fractures are noted as well. Left adrenal mass is noted likely representing an adenoma stable from the prior exam from 2014. Electronically Signed   By: Inez Catalina M.D.   On: 05/25/2021 23:23   DG Thoracic Spine 2 View  Result Date: 05/01/2021 CLINICAL DATA:  Upper back pain. History of kyphoplasty. No known injury. EXAM: THORACIC SPINE 2 VIEWS COMPARISON:  Thoracic spine 12/09/2018, thoracic spine series report 12/10/2018, thoracic spine series 11/02/2018. MRI thoracic spine 11/23/2018. FINDINGS: Thoracic spine numbered the lowest ribbed vertebra as T12. T6 and T7 kyphoplasties again noted. Thoracic spine kyphoscoliosis. Mild-to-moderate new T8 compression fracture noted. Diffuse osteopenia and mild degenerative change. Prior median sternotomy. Prior cardiac valve replacement. Epicardial pacing wires noted. Surgical clips noted over the abdomen. IMPRESSION: 1.  Mild-to-moderate new T8 compression fracture. 2.  Stable appearance  of T6 and T7 kyphoplasties. 3.  Diffuse osteopenia  and mild degenerative change. Electronically Signed   By: Marcello Moores  Register  M.D.   On: 05/01/2021 08:38   Pelvis Portable  Result Date: 05/02/2021 CLINICAL DATA:  Post right total anterior hip EXAM: PORTABLE PELVIS 1-2 VIEWS COMPARISON:  04/30/2021 FINDINGS: Interval placement of a right total hip arthroplasty. Non cemented components appear well seated. Somewhat flattened acetabular cup angle. Skin clips, soft tissue gas, and soft tissue drain are consistent with recent surgery. IMPRESSION: Interval right total hip arthroplasty. Components appear well seated. Electronically Signed   By: Lucienne Capers M.D.   On: 04/28/2021 17:55   DG Pelvis Portable  Result Date: 05/07/2021 CLINICAL DATA:  Recent trip and fall with pelvic pain, initial encounter EXAM: PORTABLE PELVIS 1 VIEWS COMPARISON:  None. FINDINGS: Right subcapital femoral neck fracture is noted with impaction at the fracture site. Pelvic ring is intact. No other focal abnormality is seen. IMPRESSION: Right subcapital femoral neck fracture. Electronically Signed   By: Inez Catalina M.D.   On: 05/26/2021 22:54   CT L-SPINE NO CHARGE  Result Date: 05/01/2021 CLINICAL DATA:  Fall EXAM: CT LUMBAR SPINE WITHOUT CONTRAST TECHNIQUE: Multidetector CT imaging of the lumbar spine was performed without intravenous contrast administration. Multiplanar CT image reconstructions were also generated. COMPARISON:  None. FINDINGS: Segmentation: 5 lumbar type vertebrae. Alignment: Grade 1 anterolisthesis at L4-5 Vertebrae: No acute fracture or focal pathologic process. Paraspinal and other soft tissues: Negative. Disc levels: No spinal canal or neural foraminal stenosis. IMPRESSION: Grade 1 anterolisthesis at L4-5 without spinal canal or neural foraminal stenosis. Electronically Signed   By: Ulyses Jarred M.D.   On: 04/27/2021 23:40   DG Chest Port 1 View  Result Date: 05/01/2021 CLINICAL DATA:  Recent fall with chest pain, initial encounter EXAM: PORTABLE CHEST 1 VIEW COMPARISON:  06/23/2018 FINDINGS: Cardiac shadow is mildly  prominent. Postsurgical changes are again seen. Bilateral breast implants are noted causing generalized density over the chest bilaterally. Mild vascular congestion is seen. No focal infiltrate or effusion is noted. No acute bony abnormality is seen. Multiple old rib fractures are noted on the right. IMPRESSION: Mild vascular congestion. Generalized density over the chest is related to overlying breast implants. No other acute abnormality is noted. Electronically Signed   By: Inez Catalina M.D.   On: 05/26/2021 22:51   DG C-Arm 1-60 Min-No Report  Result Date: 05/16/2021 Fluoroscopy was utilized by the requesting physician.  No radiographic interpretation.   DG C-Arm 1-60 Min-No Report  Result Date: 05/07/2021 Fluoroscopy was utilized by the requesting physician.  No radiographic interpretation.   ECHOCARDIOGRAM COMPLETE  Result Date: 05/20/2021    ECHOCARDIOGRAM REPORT   Patient Name:   Grace [name] Date of Exam: 05/20/2021 Medical Rec #:  [id number]        Height:       63.5 in Accession #:    [id number]       Weight:       138.2 lb Date of Birth:  1950-06-07        BSA:          1.662 m Patient Age:    71 years         BP:           102/65 mmHg Patient Gender: F                HR:           93 bpm. Exam Location:  Inpatient Procedure: 2D Echo, Cardiac Doppler and  Color Doppler Indications:    Cardiomyopathy-Unspecified I42.9  History:        Patient has prior history of Echocardiogram examinations, most                 recent 07/31/2006. CAD, TIA and Stroke, Arrythmias:Atrial                 Fibrillation and Atrial Flutter; Risk Factors:Dyslipidemia. CKD                 (chronic kidney disease), stage III, Hx of long-term (current)                 use of anticoagulants, Breast cancer with bilateral implants,                 H/O aortic valve replacement and mitral valve replacement, WPW                 syndrome.  Sonographer:    Alvino Chapel RCS Referring Phys: [id number] [facility] [place]   Sonographer Comments: Technically difficult study due to poor echo windows. Image acquisition challenging due to breast implants. IMPRESSIONS  1. Left ventricular ejection fraction, by estimation, is 55 to 60%. The left ventricle has normal function. Left ventricular endocardial border not optimally defined to evaluate regional wall motion. Left ventricular diastolic parameters are indeterminate.  2. Right ventricular systolic function is normal. The right ventricular size is mildly enlarged.  3. Right atrial size was moderately dilated.  4. The mitral valve has been repaired/replaced. No evidence of mitral valve regurgitation. No evidence of mitral stenosis.  5. Tricuspid valve regurgitation is severe.  6. The aortic valve was not well visualized. Aortic valve regurgitation is not visualized. No aortic stenosis is present. Aortic valve mean gradient measures 7.0 mmHg. Aortic valve Vmax measures 1.79 m/s.  7. The inferior vena cava is normal in size with greater than 50% respiratory variability, suggesting right atrial pressure of 3 mmHg.  8. Very limited echo due to poor sound wave transmission. FINDINGS  Left Ventricle: Left ventricular ejection fraction, by estimation, is 55 to 60%. The left ventricle has normal function. Left ventricular endocardial border not optimally defined to evaluate regional wall motion. The left ventricular internal cavity size was normal in size. There is no left ventricular hypertrophy. Left ventricular diastolic parameters are indeterminate. Right Ventricle: The right ventricular size is mildly enlarged. No increase in right ventricular wall thickness. Right ventricular systolic function is normal. Left Atrium: Left atrial size was not well visualized. Right Atrium: Right atrial size was moderately dilated. Pericardium: There is no evidence of pericardial effusion. Mitral Valve: The mitral valve has been repaired/replaced. No evidence of mitral valve regurgitation. There is a  mechanical valve present in the mitral position. No evidence of mitral valve stenosis. MV peak gradient, 10.0 mmHg. The mean mitral valve gradient is 4.0 mmHg. Tricuspid Valve: The tricuspid valve is grossly normal. Tricuspid valve regurgitation is severe. No evidence of tricuspid stenosis. Aortic Valve: The aortic valve was not well visualized. Aortic valve regurgitation is not visualized. No aortic stenosis is present. Aortic valve mean gradient measures 7.0 mmHg. Aortic valve peak gradient measures 12.8 mmHg. There is a mechanical valve present in the aortic position. Pulmonic Valve: The pulmonic valve was not well visualized. Pulmonic valve regurgitation is not visualized. No evidence of pulmonic stenosis. Aorta: The aortic root is normal in size and structure. Venous: The inferior vena cava is normal in size with greater than 50% respiratory variability,  suggesting right atrial pressure of 3 mmHg. IAS/Shunts: No atrial level shunt detected by color flow Doppler.   Diastology LV e' medial:    4.90 cm/s LV E/e' medial:  29.2 LV e' lateral:   8.59 cm/s LV E/e' lateral: 16.6  RIGHT VENTRICLE RV S prime:     6.31 cm/s TAPSE (M-mode): 1.0 cm LEFT ATRIUM           Index        RIGHT ATRIUM           Index LA Vol (A4C): 62.5 ml 37.60 ml/m  RA Area:     17.10 cm                                    RA Volume:   48.40 ml  29.11 ml/m  AORTIC VALVE AV Vmax:           179.00 cm/s AV Vmean:          115.000 cm/s AV VTI:            0.282 m AV Peak Grad:      12.8 mmHg AV Mean Grad:      7.0 mmHg LVOT Vmax:         95.60 cm/s LVOT Vmean:        59.200 cm/s LVOT VTI:          0.149 m LVOT/AV VTI ratio: 0.53 MITRAL VALVE                TRICUSPID VALVE MV Area (PHT): 5.27 cm     TR Peak grad:   33.9 mmHg MV Peak grad:  10.0 mmHg    TR Vmax:        291.00 cm/s MV Mean grad:  4.0 mmHg MV Vmax:       1.58 m/s     SHUNTS MV Vmean:      89.2 cm/s    Systemic VTI: 0.15 m MV Decel Time: 144 msec MV E velocity: 143.00 cm/s Glori Bickers MD Electronically signed by Glori Bickers MD Signature Date/Time: 05/20/2021/4:17:43 PM    Final    DG HIP UNILAT WITH PELVIS 1V RIGHT  Result Date: 05/04/2021 CLINICAL DATA:  Right total anterior hip EXAM: DG HIP (WITH OR WITHOUT PELVIS) 1V RIGHT COMPARISON:  05/24/2021 FINDINGS: Intraoperative fluoroscopy is obtained for surgical control purposes. Fluoroscopy time is recorded at 14 seconds. Cumulative dose is 1.5128 mGy. Ten spot fluoroscopic images are obtained. Spot fluoroscopic images obtained initially demonstrate a mildly displaced right femoral neck fracture with varus angulation. Subsequent images demonstrate sizing and placement of a right total hip arthroplasty with non cemented components. Components appear well seated. Mildly flattened angle of the acetabular cup. IMPRESSION: Intraoperative fluoroscopy is obtained for surgical control purposes demonstrating placement of a right total hip arthroplasty for repair of a right femoral neck fracture. Electronically Signed   By: Lucienne Capers M.D.   On: 05/09/2021 17:55   DG Femur Portable Min 2 Views Right  Result Date: 05/24/2021 CLINICAL DATA:  Recent fall with right leg pain, initial encounter EXAM: RIGHT FEMUR PORTABLE 2 VIEW COMPARISON:  None. FINDINGS: Subcapital right femoral neck fracture is noted with impaction and angulation at the fracture site. The more distal femur appears within normal limits. IMPRESSION: Right femoral neck fracture as described. Electronically Signed   By: Inez Catalina M.D.   On: 05/06/2021 22:54  Microbiology Recent Results (from the past 240 hour(s))  Resp Panel by RT-PCR (Flu A&B, Covid) Nasopharyngeal Swab     Status: None   Collection Time: 05/20/21 12:02 AM   Specimen: Nasopharyngeal Swab; Nasopharyngeal(NP) swabs in vial transport medium  Result Value Ref Range Status   SARS Coronavirus 2 by RT PCR NEGATIVE NEGATIVE Final    Comment: (NOTE) SARS-CoV-2 target nucleic acids are NOT  DETECTED.  The SARS-CoV-2 RNA is generally detectable in upper respiratory specimens during the acute phase of infection. The lowest concentration of SARS-CoV-2 viral copies this assay can detect is 138 copies/mL. A negative result does not preclude SARS-Cov-2 infection and should not be used as the sole basis for treatment or other patient management decisions. A negative result may occur with  improper specimen collection/handling, submission of specimen other than nasopharyngeal swab, presence of viral mutation(s) within the areas targeted by this assay, and inadequate number of viral copies(<138 copies/mL). A negative result must be combined with clinical observations, patient history, and epidemiological information. The expected result is Negative.  Fact Sheet for Patients:  EntrepreneurPulse.com.au  Fact Sheet for Healthcare Providers:  IncredibleEmployment.be  This test is no t yet approved or cleared by the [place] FDA and  has been authorized for detection and/or diagnosis of SARS-CoV-2 by FDA under an Emergency Use Authorization (EUA). This EUA will remain  in effect (meaning this test can be used) for the duration of the COVID-19 declaration under Section 564(b)(1) of the Act, 21 U.S.C.section 360bbb-3(b)(1), unless the authorization is terminated  or revoked sooner.       Influenza A by PCR NEGATIVE NEGATIVE Final   Influenza B by PCR NEGATIVE NEGATIVE Final    Comment: (NOTE) The Xpert Xpress SARS-CoV-2/FLU/RSV plus assay is intended as an aid in the diagnosis of influenza from Nasopharyngeal swab specimens and should not be used as a sole basis for treatment. Nasal washings and aspirates are unacceptable for Xpert Xpress SARS-CoV-2/FLU/RSV testing.  Fact Sheet for Patients: EntrepreneurPulse.com.au  Fact Sheet for Healthcare Providers: IncredibleEmployment.be  This test is not yet  approved or cleared by the [place] FDA and has been authorized for detection and/or diagnosis of SARS-CoV-2 by FDA under an Emergency Use Authorization (EUA). This EUA will remain in effect (meaning this test can be used) for the duration of the COVID-19 declaration under Section 564(b)(1) of the Act, 21 U.S.C. section 360bbb-3(b)(1), unless the authorization is terminated or revoked.  Performed at [facility], [place] [address] [place], [place] [address]   Surgical PCR screen     Status: None   Collection Time: 05/15/2021  2:28 AM   Specimen: Nasal Mucosa; Nasal Swab  Result Value Ref Range Status   MRSA, PCR NEGATIVE NEGATIVE Final   Staphylococcus aureus NEGATIVE NEGATIVE Final    Comment: (NOTE) The Xpert SA Assay (FDA approved for NASAL specimens in patients 37 years of age and older), is one component of a comprehensive surveillance program. It is not intended to diagnose infection nor to guide or monitor treatment. Performed at [facility], [place] [address] [place], [place] [address]     Lab Basic Metabolic Panel: Recent Labs  Lab 05/04/2021 0503 05/24/21 0446 05/25/21 0502 05/26/21 0530 05/27/21 0517 05/28/21 0513 06/03/21 0419  NA 130* 123* 127*  --  132* 134* 130*  K 3.8 3.5 3.6  --  2.9* 3.1* 4.6  CL 96* 90* 92*  --  94* 99 97*  CO2 25 23 27   --  28 26 21*  GLUCOSE 105* 140* 107*  --  89 95 116*  BUN 15 23 19   --  12 9 11   CREATININE 0.71 0.89 0.70  --  0.52 0.57 0.77  CALCIUM 7.5* 6.5* 7.1*  --  6.4* 6.7* 6.5*  MG 2.3 2.0 2.3 2.2  --  1.7  --   PHOS  --   --  1.2*  --   --  2.9  --    Liver Function Tests: No results for input(s): AST, ALT, ALKPHOS, BILITOT, PROT, ALBUMIN in the last 168 hours. No results for input(s): LIPASE, AMYLASE in the last 168 hours. No results for input(s): AMMONIA in the last 168 hours. CBC: Recent Labs  Lab 05/25/21 0502 05/26/21 0530 05/27/21 0517 05/28/21 0513 06/21/2021 0419  WBC 13.8* 15.6*  14.6* 13.5* 22.9*  HGB 8.8* 9.5* 8.3* 8.4* 7.4*  HCT 28.0* 29.3* 27.0* 26.0* 23.2*  MCV 87.8 86.7 88.5 87.5 86.9  PLT 325 411* 438* 477* 480*   Cardiac Enzymes: No results for input(s): CKTOTAL, CKMB, CKMBINDEX, TROPONINI in the last 168 hours. Sepsis Labs: Recent Labs  Lab 05/26/21 0530 05/27/21 0517 05/28/21 0513 06-21-21 0419  WBC 15.6* 14.6* 13.5* 22.9*    Procedures/Operations  CPR  right total hip arthroplasty on 05/10/2021   Lydiana Milley 06/21/2021, 4:43 PM

## 2021-06-27 NOTE — Progress Notes (Addendum)
Patient was found by Physical Therapy unresponsive. RN in the hallway was asked to come into the room and check pulse. Patient had pulse at the time but was unresponsive. Patient HR stopped before compressions began. Compressions started at about 9:50 am, Code blue called at 9:54 and Intubation at 10:02 am. After CPR, Epinephrine, Normal Saline bolus was given, time of death was called at 10:27 am.  Layla Maw, RN

## 2021-06-27 NOTE — Progress Notes (Signed)
Chaplain provided support to Grace [name]'s husband, Grace [name], as he learned the news of his wife's death.  He provided the information for funeral services:  [employer] and [employer] services in [place].  Grace [name] is spending some time with her.  The rest of the family is in [place] and he has been on the phone with them.  [place], [place] Pager, [phone] 10:58 AM

## 2021-06-27 NOTE — ED Provider Notes (Signed)
CPR/CODE BLUE record.  Called to floor when CODE BLUE called called overhead.  Anesthesia was in the room and intubated patient.  Had reportedly had some pain medicines earlier in the day and has been sleeping comfortably but then found out pulse.  ACLS provided.  CPR done.  Epinephrine given.  Calcium also given due to hypocalcemia earlier.  Narcan also given although likely be low utility at this point.  Patient stayed in a PEA.  Eventually went into a shockable rhythm.  Defibrillated with return of pulses.  Dr. Louanne Belton and later ICU physician also assisting.  Husband present.   Davonna Belling, MD 06/05/21 [phone]

## 2021-06-27 NOTE — Progress Notes (Signed)
Charge nurse called and made medical examiner, Arnoldo Morale aware of death. ME to examine patient. Husband, Hermelinda Medicus, Dr. Louanne Belton, patient placement and nursing supervisor made aware.

## 2021-06-27 NOTE — Anesthesia Procedure Notes (Signed)
Procedure Name: Intubation Date/Time: 06/21/2021 10:02 AM Performed by: Lollie Sails, CRNA Pre-anesthesia Checklist: Patient identified, Emergency Drugs available, Suction available and Timeout performed Oxygen Delivery Method: Ambu bag Preoxygenation: Pre-oxygenation with 100% oxygen Laryngoscope Size: Miller and 3 Grade View: Grade I Tube type: Oral Tube size: 7.0 mm Number of attempts: 2 Airway Equipment and Method: Stylet Placement Confirmation: ETT inserted through vocal cords under direct vision, breath sounds checked- equal and bilateral and CO2 detector Secured at: 23 cm Tube secured with: Tape Dental Injury: Teeth and Oropharynx as per pre-operative assessment  Comments: First DL unable to visualize cords.    Resp via ambu for 30 seconds.   DL second attempt with full visualization of cords - passed tube smoothly.

## 2021-06-27 NOTE — Progress Notes (Signed)
PT Cancellation Note  Patient Details Name: Grace [name] MRN: [id number] DOB: 1951/04/16   Cancelled Treatment:    Reason Eval/Treat Not Completed: Medical issues which prohibited therapy Medical Code in process.   Kati L Payson 2021-06-19, 11:51 AM Arlyce Dice, DPT Acute Rehabilitation Services Pager: [phone] Office: [phone]

## 2021-06-27 DEATH — deceased

## 2021-07-26 ENCOUNTER — Ambulatory Visit: Payer: Medicare PPO | Admitting: Internal Medicine

## 2021-10-05 ENCOUNTER — Ambulatory Visit: Payer: Medicare PPO | Admitting: Family Medicine

## 2021-10-22 ENCOUNTER — Ambulatory Visit: Payer: Medicare PPO

## 2021-10-23 ENCOUNTER — Ambulatory Visit: Payer: Medicare PPO

## 2022-04-08 ENCOUNTER — Encounter: Payer: Medicare PPO | Admitting: Family Medicine
